# Patient Record
Sex: Male | Born: 1950 | Race: White | Hispanic: No | Smoking: Former smoker
Health system: Southern US, Community
[De-identification: ages and names within clinical notes are randomized; demographics above are authoritative.]

## PROBLEM LIST (undated history)

## (undated) DIAGNOSIS — R42 Dizziness and giddiness: Secondary | ICD-10-CM

## (undated) DIAGNOSIS — J439 Emphysema, unspecified: Secondary | ICD-10-CM

## (undated) DIAGNOSIS — M797 Fibromyalgia: Secondary | ICD-10-CM

## (undated) DIAGNOSIS — Z85828 Personal history of other malignant neoplasm of skin: Secondary | ICD-10-CM

## (undated) DIAGNOSIS — C679 Malignant neoplasm of bladder, unspecified: Secondary | ICD-10-CM

## (undated) DIAGNOSIS — I2510 Atherosclerotic heart disease of native coronary artery without angina pectoris: Secondary | ICD-10-CM

## (undated) DIAGNOSIS — F419 Anxiety disorder, unspecified: Secondary | ICD-10-CM

## (undated) DIAGNOSIS — Z87442 Personal history of urinary calculi: Secondary | ICD-10-CM

## (undated) DIAGNOSIS — K219 Gastro-esophageal reflux disease without esophagitis: Secondary | ICD-10-CM

## (undated) DIAGNOSIS — R300 Dysuria: Secondary | ICD-10-CM

## (undated) DIAGNOSIS — I714 Abdominal aortic aneurysm, without rupture: Secondary | ICD-10-CM

## (undated) DIAGNOSIS — N401 Enlarged prostate with lower urinary tract symptoms: Secondary | ICD-10-CM

## (undated) DIAGNOSIS — I480 Paroxysmal atrial fibrillation: Secondary | ICD-10-CM

## (undated) DIAGNOSIS — Z9889 Other specified postprocedural states: Secondary | ICD-10-CM

## (undated) DIAGNOSIS — I499 Cardiac arrhythmia, unspecified: Secondary | ICD-10-CM

## (undated) DIAGNOSIS — R112 Nausea with vomiting, unspecified: Secondary | ICD-10-CM

## (undated) DIAGNOSIS — M1990 Unspecified osteoarthritis, unspecified site: Secondary | ICD-10-CM

## (undated) DIAGNOSIS — Z9861 Coronary angioplasty status: Secondary | ICD-10-CM

## (undated) DIAGNOSIS — E039 Hypothyroidism, unspecified: Secondary | ICD-10-CM

## (undated) DIAGNOSIS — Z973 Presence of spectacles and contact lenses: Secondary | ICD-10-CM

## (undated) HISTORY — PX: CORONARY ANGIOPLASTY: SHX604

## (undated) HISTORY — PX: CARDIAC CATHETERIZATION: SHX172

---
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
## 1998-05-29 ENCOUNTER — Ambulatory Visit (HOSPITAL_COMMUNITY): Admission: RE | Admit: 1998-05-29 | Discharge: 1998-05-29 | Payer: Self-pay | Admitting: Internal Medicine

## 1998-05-31 DIAGNOSIS — I2510 Atherosclerotic heart disease of native coronary artery without angina pectoris: Secondary | ICD-10-CM

## 1998-05-31 HISTORY — DX: Atherosclerotic heart disease of native coronary artery without angina pectoris: I25.10

## 1999-01-16 ENCOUNTER — Emergency Department (HOSPITAL_COMMUNITY): Admission: EM | Admit: 1999-01-16 | Discharge: 1999-01-17 | Payer: Self-pay | Admitting: Internal Medicine

## 1999-04-06 ENCOUNTER — Emergency Department (HOSPITAL_COMMUNITY): Admission: EM | Admit: 1999-04-06 | Discharge: 1999-04-06 | Payer: Self-pay | Admitting: Emergency Medicine

## 1999-05-16 ENCOUNTER — Inpatient Hospital Stay (HOSPITAL_COMMUNITY): Admission: EM | Admit: 1999-05-16 | Discharge: 1999-05-19 | Payer: Self-pay | Admitting: Emergency Medicine

## 1999-05-16 ENCOUNTER — Encounter: Payer: Self-pay | Admitting: Internal Medicine

## 1999-05-19 ENCOUNTER — Inpatient Hospital Stay (HOSPITAL_COMMUNITY): Admission: EM | Admit: 1999-05-19 | Discharge: 1999-05-20 | Payer: Self-pay | Admitting: Emergency Medicine

## 1999-05-19 ENCOUNTER — Encounter: Payer: Self-pay | Admitting: *Deleted

## 1999-05-26 ENCOUNTER — Encounter: Payer: Self-pay | Admitting: Emergency Medicine

## 1999-05-26 ENCOUNTER — Inpatient Hospital Stay (HOSPITAL_COMMUNITY): Admission: EM | Admit: 1999-05-26 | Discharge: 1999-05-27 | Payer: Self-pay | Admitting: Emergency Medicine

## 2001-06-14 ENCOUNTER — Observation Stay (HOSPITAL_COMMUNITY): Admission: EM | Admit: 2001-06-14 | Discharge: 2001-06-14 | Payer: Self-pay | Admitting: *Deleted

## 2001-06-14 ENCOUNTER — Encounter: Payer: Self-pay | Admitting: Emergency Medicine

## 2002-07-23 ENCOUNTER — Inpatient Hospital Stay (HOSPITAL_COMMUNITY): Admission: EM | Admit: 2002-07-23 | Discharge: 2002-07-25 | Payer: Self-pay | Admitting: Emergency Medicine

## 2002-12-07 ENCOUNTER — Encounter: Admission: RE | Admit: 2002-12-07 | Discharge: 2002-12-07 | Payer: Self-pay | Admitting: Internal Medicine

## 2002-12-07 ENCOUNTER — Encounter: Payer: Self-pay | Admitting: Internal Medicine

## 2002-12-08 ENCOUNTER — Encounter: Payer: Self-pay | Admitting: Internal Medicine

## 2002-12-08 ENCOUNTER — Encounter: Admission: RE | Admit: 2002-12-08 | Discharge: 2002-12-08 | Payer: Self-pay | Admitting: Internal Medicine

## 2005-11-02 ENCOUNTER — Emergency Department (HOSPITAL_COMMUNITY): Admission: EM | Admit: 2005-11-02 | Discharge: 2005-11-02 | Payer: Self-pay | Admitting: Emergency Medicine

## 2006-08-10 ENCOUNTER — Emergency Department (HOSPITAL_COMMUNITY): Admission: EM | Admit: 2006-08-10 | Discharge: 2006-08-11 | Payer: Self-pay | Admitting: Emergency Medicine

## 2006-08-24 ENCOUNTER — Ambulatory Visit (HOSPITAL_COMMUNITY): Admission: RE | Admit: 2006-08-24 | Discharge: 2006-08-24 | Payer: Self-pay | Admitting: Gastroenterology

## 2006-08-24 HISTORY — PX: ESOPHAGOGASTRODUODENOSCOPY: SHX1529

## 2006-09-06 ENCOUNTER — Encounter: Admission: RE | Admit: 2006-09-06 | Discharge: 2006-09-06 | Payer: Self-pay | Admitting: Internal Medicine

## 2007-05-09 ENCOUNTER — Observation Stay (HOSPITAL_COMMUNITY): Admission: EM | Admit: 2007-05-09 | Discharge: 2007-05-10 | Payer: Self-pay | Admitting: Emergency Medicine

## 2008-06-12 HISTORY — PX: CARDIOVASCULAR STRESS TEST: SHX262

## 2010-10-13 NOTE — Discharge Summary (Signed)
[name], [name]              ACCOUNT NO.:  [id number]   MEDICAL RECORD NO.:  [id number]          PATIENT TYPE:  INP   LOCATION:  3733                         FACILITY:  [facility]   PHYSICIAN:  Nicki Guadalajara,, MD      DATE OF BIRTH:  06/11/1950   DATE OF ADMISSION:  05/09/2007  DATE OF DISCHARGE:  05/10/2007                               DISCHARGE SUMMARY   HISTORY:  Mr. Hard is a 60 year old male patient of Dr. Clayborne Dana who came to the emergency room with complaints of chest pain  radiating to his left arm last night.  He took some Aleve and the pain  resolved.  In the morning he had gone to work.  He had a recurrence of  his chest pain.  He then went to the emergency room.  He was given  sublingual nitroglycerin and IV morphine, and he had a vasovagal  reaction.  His blood pressure dropped.  His heart rate dropped.  He  required atropine IV.  When he was seen by Dr. Ritta Slot,  he was  pain-free.  He does have a prior medical history of coronary artery  disease.  He has had a percutaneous transluminal coronary angiography to  his distal LAD in 2000.  He had a re-catheterization in 2003.  He had  patent coronaries.  It was decided that he should be admitted and put on  IV heparin and ruled out for a myocardial infarction.   HOSPITAL COURSE:  He was seen by Dr. Nicki Guadalajara on May 10, 2007.  It was felt that the chest pain was somewhat atypical.  His CK-MBs were  negative x3  It was decided that he could have an outpatient Myoview  test.   LABORATORY DATA:  Hemoglobin 14.8, hematocrit 43.7, WBC 7.1, platelets  206.  Sodium 137, potassium 3.4, BUN 13, creatinine 0.07.  Total  cholesterol 158, HDL 35, triglycerides 86 and LDL 106.  CKs and  troponins were negative x3.   DISCHARGE MEDICATIONS:  1. Prevacid 40 mg daily.  2. Xanax 1 mg three times daily.  3. Aspirin 81 mg daily.  4. Ramipril 2.5 mg daily.  5. Crestor 10 mg daily.  He was given prescriptions  for his new      medications which are Ramipril and Crestor.   DISCHARGE DIAGNOSES:  1. Chest pain with negative CK-MBs and troponins.  The pain is      somewhat atypical.  It was decided that he could be worked up as an      outpatient.  2. Known coronary artery disease with intervention in 2000, with a re-      catheterization in 2003, with clean coronaries:  Last Myoview in      the office was in December 2007, which was a stress Myoview and was      negative for any ischemia.  3. Hyperlipidemia:  He is now placed on Crestor.  His HDL is low and      his LDL is 107, with a goal of less than 70.  4. Vasovagal reaction with nitroglycerin and  morphine:  He should not      receive nitroglycerin sublingual any more.  5. Gastroesophageal reflux disease.      Marvin Gill, N.P.    ______________________________  Nicki Guadalajara,, MD    BB/MEDQ  D:  05/10/2007  T:  05/10/2007  Job:  [id number]

## 2010-10-16 NOTE — Op Note (Signed)
NAMEJOSHUA, SOULIER NO.:  [id number]   MEDICAL RECORD NO.:  [id number]          PATIENT TYPE:  AMB   LOCATION:  ENDO                         FACILITY:  [facility]   PHYSICIAN:  Danise Edge, M.D.   DATE OF BIRTH:  [birth year]   DATE OF PROCEDURE:  08/24/2006  DATE OF DISCHARGE:                               OPERATIVE REPORT   PROCEDURE INDICATION:  Mr. Corrin Hingle is a 60 year old male born  1950/12/05.  Mr. Magoon has a foreign body sensation in his  throat and occasionally gets choked when he swallows.  His symptoms are  localized to the cervical esophagus.  He is also due for a screening  colonoscopy with polypectomy to prevent colon cancer.   ENDOSCOPIST:  Danise Edge, M.D.   PREMEDICATION:  Fentanyl 150 mcg, Versed 15 mg.   PROCEDURE:  Esophagogastroduodenoscopy.  After obtaining informed  consent, Mr. Esty was placed in the left lateral decubitus position.  I administered intravenous fentanyl and intravenous Versed to achieve  conscious sedation for the procedure.  The patient's blood pressure,  oxygen saturation and cardiac rhythm were monitored throughout the  procedure and documented in the medical record.   The Olympus gastroscope was passed through the posterior hypopharynx  into the proximal esophagus without difficulty.  The hypopharynx, larynx  and vocal cords appeared normal.   Esophagoscopy:  The proximal mid and lower segments of the esophageal  mucosa appeared normal.   Gastroscopy:  Retroflexed view of the gastric cardia and fundus was  normal.  The gastric body, antrum and pylorus appeared normal.   Duodenoscopy:  The duodenal bulb and descending duodenum appeared  normal.   ASSESSMENT:  Normal esophagogastroduodenoscopy.   PROCEDURE:  Screening proctocolonoscopy to the cecum.  Anal inspection  and digital rectal exam were normal except for an enlarged but  nonnodular prostate.  The Olympus Pentax colonoscope was  introduced into  the rectum and advanced to the cecum.  Colonic preparation for the exam  today was good   Rectum normal.  Retroflexed view of the distal rectum normal.  Sigmoid colon and descending colon normal.  Splenic flexure normal.  Transverse colon normal.  Hepatic flexure normal.  Ascending colon normal.  Cecum and ileocecal valve normal.   ASSESSMENT:  Normal screening proctocolonoscopy to the cecum.   RECOMMENDATIONS:  If difficulty swallowing persists, I would recommend a  barium tablet - liquid barium swallow x-ray.           ______________________________  Danise Edge, M.D.     MJ/MEDQ  D:  08/24/2006  T:  08/24/2006  Job:  [id number]

## 2010-10-16 NOTE — H&P (Signed)
. [facility]  Patient:    Marvin Gill, DONSON Visit Number: [id number] MRN: [id number]          Service Type: MED Location: 1800 [id number] [id number] Attending Physician:  Carmelina Peal Dictated by:   Marya Fossa, P.A. Admit Date:  06/14/2001   CC:         Runell Gess, M.D.  Barbette Hair. Vaughan Basta., M.D.   History and Physical  ADMISSION DIAGNOSES: 1. Chest pain, rule out myocardial infarction. 2. Coronary artery disease. 3. Remote tobacco abuse.  HISTORY OF PRESENT ILLNESS:  A 60 year old white male patient of Dr. Nanetta Batty with known coronary artery disease.  He had been experiencing chest pain x 1 month (two to three times a week) usually when trying to rest at night.  He did not use nitroglycerin, as it was expired.  His symptoms would progress to a 6/10.  He would have shortness of breath, occasional dizziness, and the discomfort would generally last most of the night; nonexertional.  Last night around 3 or 4 a.m. he was awakened from sleep secondary to chest pain under the left breast.  It was dull and constant; 6/10.  Shortness of breath and nausea.  Could not fall back to sleep.  Took Vioxx without relief. Was advised to come to the emergency room by Dr. Renelda Mom office.  Upon arrival to the emergency room the patient continued to have 6-7/10 chest pain.  IV nitroglycerin and heparin were started.  Pain improved to 3/10.  He states his pain is similar to chest pain he has had in the past.  ALLERGIES:  CODEINE.  MEDICATIONS: 1. Aspirin. 2. Prevacid 30 mg a day. 3. Paxil 40 mg a day. 4. Xanax 1 mg a day. 5. Vioxx. 6. Cortisporin Otic four drops q.i.d. to the right great toe.  PAST MEDICAL HISTORY:  Coronary disease, heart catheterization May 18, 1999 by Dr. Jenne Campus revealed a distal LAD lesion of 80%.  The vessel was small and so CrossSail balloon PTCA was performed but no stenting.  EF 60%. Essentially single-vessel  disease.  Patient denies hyperlipidemia.  Last checked one year ago.  Denies hypertension or diabetes.  He had an ingrown toenail removal of the right great toe on June 13, 2001.  SOCIAL HISTORY:  Patient is married, father of two, grandfather of two.  No tobacco or alcohol.  Works as Clinical biochemist person.  No new or strenuous activities.  No regular exercise and he does not watch his diet.  FAMILY HISTORY:  Mother deceased with a brain tumor.  Father alive and well, has hypertension.  Two sisters are healthy.  REVIEW OF SYSTEMS:  See HPI.  Denies constitutional, GI, or GU complaints.  He has gained about 50 pounds over the last two years and feels this is secondary to Paxil therapy.  PHYSICAL EXAMINATION:  VITAL SIGNS:  Temperature 97, blood pressure 116/72, pulse 66, respirations 20, SAO2 97% on room air.  GENERAL:  Patient is alert and oriented x 3 in no acute distress, sitting on the exam table.  HEENT:  Normocephalic, atraumatic.  PERRL, EOMI.  Nares patent, pharynx benign.  NECK:  Supple without bruits or masses.  LUNGS:  Clear to auscultation.  CARDIAC:  Regular rate and rhythm without murmurs, gallops, or rubs.  ABDOMEN:  Soft, nontender, nondistended, normal active bowel sounds, ______.  EXTREMITIES:  ______ bilaterally.  No bruits.  Distal pulses 2+ bilaterally without edema.  Bandage on the right great toe.  NEUROLOGIC:  Nonfocal.  LABORATORY DATA:  WBC 6.7, hemoglobin 15.2, platelets 221.  Potassium 4.0, BUN 20, creatinine 0.9, glucose 95.  First enzymes negative.  EKG shows sinus bradycardia without acute abnormality.  Chest x-ray pending.  IMPRESSION: 1. Chest pain, rule out myocardial infarction. 2. Single-vessel coronary artery disease. 3. Unknown lipid status.  PLAN: 1. Admit. 2. IV heparin, IV nitroglycerin. 3. Cycle enzymes. 4. Heart catheterization. Dictated by:   Marya Fossa, P.A. Attending Physician:  Carmelina Peal DD:   06/14/01 TD:  06/14/01 Job: [id number]

## 2010-10-16 NOTE — Cardiovascular Report (Signed)
[place]  Patient:    Marvin Gill                      MRN: [id number] Proc. Date: 05/18/99 Adm. Date:  [id number] Attending:  Kae Heller                        Cardiac Catheterization  ATTENDING FOR THE CASE:  Lenise Herald, M.D.  PROCEDURES: 1. Left heart catheterization. 2. Coronary angiography. 3. Left ventriculogram. 4. ______  of left anterior descending artery-distal    (a) Percutaneous transluminal coronary balloon angioplasty  COMPLICATIONS:  None.  INDICATIONS:  [name] is 60 year old white male patient of Dr. Ike Bene with a history of tobacco abuse who was admitted on May 16, 1999, with substernal chest pain [place] the airport.  He subsequently ruled out for MI and was noted to have anterolateral T wave inversions and was subsequently referred for cardiac catheterization.  DESCRIPTION OF PROCEDURE:  After giving informed written consent, the patient was brought to the cardiac catheterization lab where his right and left groins were  shaved, prepped, and draped in the usual sterile fashion.  ECG monitoring was established.  Using a modified Seldinger technique, a 6-French arterial sheath as inserted into the right femoral artery, and 6-French diagnostic catheters were hen used to perform diagnostic angiography.  ANGIOGRAPHIC RESULTS: 1. This revealed a medium sized left main with no significant disease. 2. The LAD was a medium sized vessel which coursed to the apex and gave rise to two    diagonal branches.  The LAD was noted to have a long kinking segment in the id    portion of the vessel with no high grade stenosis.  There was an 80% stenotic    lesion distal to the second diagonal which was noted to be a relatively small    ______ vessel.  The first diagonal was a small vessel with a 60% ostial    stenosis.  The second diagonal was a small vessel which bifurcated in  its distal    portion and had no significant disease.  The left coronary artery also gave ise    to a medium sized ramus intermedius which bifurcated in its mid portion and ad    no significant disease. 3. The left circumflex is a medium sized vessel which coursed in the AV groove nd    gave rise to one obtuse marginal branch.  Both the circumflex as well as the  first OM had no significant disease. 4. The RCA was a large vessel which was dominant and gave rise to both a PDA as  well as a posterolateral branch.  The RCA, PDA, and posterolateral branch had no    significant disease.  A left ventriculogram revealed normal wall motion with an estimated ejection fraction of 60%.  There is no significant MR.  Hemodynamics:  Systemic arterial pressure 107/66; LV systemic pressure 108/10; LVEDP of 16.  INTERVENTIONAL PROCEDURE:  LAD-distal:  Following diagnostic angiography, a 7-French JL4 guiding catheter as tracked and engaged in the left coronary ostium, and a selective coronary angiogram was then performed.  Next, a 0.014 Patriot exchange length guide wire was advanced out of the guiding catheter into the proximal LAD.  The guide wire was then used to cross into the distal LAD without difficulty.  Next, a CrossSail 2.0 x 20-mm balloon was then tracked over the guide wire and  positioned into the distal LAD  stenotic lesion.  Four subsequent inflations were then performed to a maximum of 10 atmospheres for a total of five minutes.  A followup angiogram revealed no evidence of dissection or thrombus with TIMI-3 flow to the distal vessel.  Because of the complexity of the case though, we elected to proceed with ReoPro administration. Intravenous doses of heparin were given to maintain the ACT between 200-300.  Final orthogonal angiograms revealed less than 20% residual stenosis in the distal LAD with TIMI-3 flow to the distal vessel with no evidence of dissection  or thrombus.  At this point, we elected to conclude the procedure.  All the balloons, wires, and catheters were removed.  Hemostatic sheaths were sewn in place.  The  patient was transferred back to the ward in stable condition.  CONCLUSIONS: 1. Successful percutaneous transluminal coronary balloon angioplasty of the distal    LAD using a CrossSail 2.0 x 20-mm balloon. 2. Adjunct use of ReoPro (abciximab) infusion. 3. Normal LV systolic function. DD:  05/18/99 TD:  05/19/99 Job: [id number]

## 2010-10-16 NOTE — Discharge Summary (Signed)
[place]  Patient:    Marvin Gill                      MRN: [id number] Adm. Date:  [id number] Disc. Date: [id number] Attending:  Darlin Priestly Dictator:   Darcella Gasman. Annie Paras, F.N.P.C. CC:         Lenise Herald, M.D.             Jaclyn Prime. Lucas Mallow, M.D.             Barbette Hair. Vaughan Basta., M.D.                           Discharge Summary  DISCHARGE DIAGNOSIS: 1.  Chest pain, atypical.     a.  Negative myocardial infarction. 2.  History of coronary artery disease with percutaneous transluminal coronary     angioplasty of the left anterior descending.  DISCHARGE CONDITION:  Improved.  PROCEDURES:  None.  DISCHARGE MEDICATIONS: 1.  Vioxx 12.5 mg 1 q.d. with food. 2.  Xanax 1 mg b.i.d. 3.  Prevacid 30 mg q.d. 4.  Hold Altace. 5.  Lipitor 10 mg q.d. 6.  Atenolol 1/2 tablet q.d. of 25 mg tablet, which would equal 12.5 mg. 7.  Enteric-coated aspirin 325 mg q.d. 8.  Zoloft 100 mg q.d. 9.  Nitroglycerin 1/150 sublingual p.r.n. chest pain.  ACTIVITY:  As tolerated.  DIET:  Low-fat/low-salt diet.  DISCHARGE INSTRUCTIONS:  If dizziness continues call the office, to stop the Atenolol.  DISPOSITION:  To follow up with Dr. Jenne Campus June 03, 1999 as previously instructed.  HISTORY OF PRESENT ILLNESS:  The patient is a 60 year old male who presented to the emergency room on May 26, 1999 with a history of chest pain.  He stated he had recurrent frequent anterior chest pain, gnawing in character an usually lasting two to three minutes at a time, and often separated by only ten minute or so ever since his second heart catheterization.  The night of admission the pain was much more severe and was associated with profound diaphoresis and with near-syncope.  He ad partial relief with two nitroglycerin and came to the emergency room, where his  pain eventually resolved.  The pain at the time of initial exam had recurred and once again  improved on IV nitroglycerin.  PAST MEDICAL HISTORY:  No major surgeries.  On May 18, 1999 he had angioplasty done on his LAD by Dr. Jenne Campus after [place] the [place] and was brought to the emergency room by EMS.  He was discharged on May 19, 1999 and returned with chest pain that same night.  Repeat cardiac catheterization on May 20, 1999 demonstrated 60% lesion in the first diagonal of the LAD, 20% in the distal LAD.  No intervention procedure was required.  Since the patient left the hospital after the second visit and second catheterization he continued to ave the above symptoms as stated.  FAMILY HISTORY, SOCIAL HISTORY, REVIEW OF SYSTEMS:  Please see the History and Physical for details.  OUTPATIENT MEDICATIONS: 1.  Prevacid 30 mg q.d. 2.  Zoloft 100 mg q.d. 3.  Aspirin 1 tablet q.d. 4.  Lipitor 10 mg q.d. 5.  Atenolol 12.5 mg b.i.d. 6.  Xanax 1 mg b.i.d. p.r.n. 7.  Altace 1.25 mg q.d.  DISCHARGE PHYSICAL EXAMINATION:  VITAL SIGNS:  Blood pressure 112/60, respirations 20, pulse 72, afebrile with temperature 97.3 degrees.  It was noted blood pressure supine was 100/60, sitting 92/60, and standing 90/60.  GENERAL:  Alert and oriented white male in no acute distress.  SKIN:  Warm and dry.  Brisk capillary refill.  LUNGS:  Clear without rales, rhonchi, or wheezes.  Normal respirations.  HEART:  Heart sounds regular rate and rhythm without gallop, click, or murmur.  ABDOMEN:  Soft, nontender, positive bowel sounds.  LABORATORY DATA:  Hemoglobin 14, hematocrit 38.9, WBC 11.1, MCV 85; platelets 206,000; neutrophils 90%, lymphs 6%, monos 3%, eosinophils 0%, basophils 0%, leukocytes 1%.  Pro time 13.5, INR 1.1, PTT 28.  Sodium 141, potassium 4, chloride 105, CO2 31, glucose 99, BUN 15, creatinine 0.9.  Calcium 8.7, total protein 6,  albumin 3.4, SGOT 20, SGPT 19, alkaline phosphatase 60, total bilirubin 1.0, magnesium 2.  Cardiac  markers showed CK 27 to 27, MB 0.3 to 0.4, Troponin less than 0.03 to less than 0.03.  Radiology:  Portable chest x-ray showed lungs hyperaerated but clear of an active process, normal cardiac/mediastinal silhouette, no congestive failure.  EKG on admission showed sinus bradycardia, heart rate 56, otherwise normal EKG. Follow-up on May 27, 1999 showed sinus bradycardia with rate 55, otherwise normal EKG, no significant change from previous tracing.  HOSPITAL COURSE:  The patient was admitted by Dr. Lucas Mallow for Dr. Jenne Campus on May 26, 1999 after experiencing significant chest pain on arrival in the emergency room.  He also had a near-syncopal episode.  He was started on IV nitroglycerin and Lovenox per pharmacy and admitted to 6500.  By the next morning cardiac enzymes  were negative and he had no further pain.  He did have some orthostatic hypotension and we changed his medication prior to discharge as he sounded hypotensive prior to admission with vagal type symptoms.  He would follow up as an outpatient with Dr. Jenne Campus.   DD:  07/09/99 TD:  07/10/99 Job: [id number]

## 2010-10-16 NOTE — Discharge Summary (Signed)
[place]  Patient:    Marvin Gill, Marvin Gill Visit Number: [id number] MRN: [id number]          Service Type: MED Location: 1800 1833 01 Attending Physician:  Darlin Priestly Dictated by:   Marya Fossa, P.A. Admit Date:  06/14/2001 Discharge Date: 06/14/2001   CC:         Marvin Hair. Vaughan Basta., M.D.  Runell Gess, M.D.   Discharge Summary  DATE OF BIRTH:  February 23, 1951.  ADMISSION DIAGNOSES: 1. Chest pain, rule out myocardial infarction. 2. Single vessel coronary disease. 3. Reflux. 4. Remote tobacco abuse.  DISCHARGE DIAGNOSES: 1. Chest pain - noncardiac - suspect gastrointestinal etiology.  Cardiac    catheterization revealing essentially normal coronary arteries and patent    prior percutaneous transluminal coronary angioplasty site of distal    left anterior descending. 2. Single vessel coronary disease. 3. Reflux. 4. Remote tobacco abuse.  HISTORY OF PRESENT ILLNESS:  This 60 year old white male patient of Runell Gess, M.D., with single vessel CAD.  For the last month, he has been experiencing chest pain which usually occurs in the evening when he lays down and stays with him through the night.  A 6/10 at worst.  Occasional shortness of breath.  He has not used nitroglycerin. Last night he had spaghetti for dinner.  Later on, he was awakened from sleep around 3 or 4 in the morning with chest discomfort on the left breast.  Dull and constant, 6/10.  Positive shortness of breath and nausea.  He could not fall back to sleep.  In the morning, he called Dr. Renelda Mom office and was advised to come to the emergency room.  Upon arrival to the ER, he continued with 6 to 7/10 chest pain.  IV nitroglycerin was started.  Currently, 3/10 chest pain with IV nitroglycerin and IV heparin.  EKG is nonacute.  First enzymes negative.  Because the patient has had persistent recurrent chest pain now waking him from sleep, we  will proceed with cardiac catheterization for definitive diagnosis.  PROCEDURES:  Cardiac catheterization by Delrae Rend, M.D., June 14, 2001.  COMPLICATIONS:  None.  CONSULTATIONS:  None.  HOSPITAL COURSE:  The patient was admitted through the emergency department of [place].  Admission labs were within normal limits. First cardiac enzymes were negative.  EKG nonacute.  The patient was taken to the cardiac catheterization lab on IV heparin and IV nitroglycerin by Dr. Jacinto Halim on June 14, 2001.  This revealed essentially normal coronary arteries.  Prior PTCA site of the distal LAD was widely patent.  The patient tolerated the procedure well.  Perclose was used.  Dr. Jacinto Halim feels that the patients symptoms are probably reflux in etiology. We have advised him to refrain from spicy and acidic food.  We are going to increase his Prevacid to 30 mg b.i.d.  We have talked to Molly Maduro D. Vaughan Basta., M.D.s, office and recommend he follow-up to evaluate possible GI component of chest pain.  It is clearly noncardiac.  DISCHARGE MEDICATIONS: 1. Aspirin a day. 2. Prevacid 30 mg b.i.d. 3. Paxil 40 mg a day. 4. Xanax 1 mg a day. 5. Vioxx p.r.n. 6. Cortisporin otic four drops q.i.d. to the right great toe as directed.  FOLLOW-UP:  He has a follow-up appointment scheduled with Dr. Allyson Sabal on June 28, 2001, at 12:15.  He is to contact Dr. Wray Kearns office to schedule a follow-up appointment as well. Dictated by:  Marya Fossa, P.A. Attending Physician:  Darlin Priestly DD:  06/14/01 TD:  06/15/01 Job: [id number]

## 2010-10-16 NOTE — Cardiovascular Report (Signed)
[place]. [facility]  Patient:    Marvin Gill, Marvin Gill Visit Number: [id number] MRN: [id number]          Service Type: MED Location: [id number] Attending Physician:  Marvin Gill Dictated by:   Marvin Gill, M.D. Proc. Date: 06/14/01 Admit Date:  06/14/2001   CC:         Marvin Hair. Vaughan Basta., M.D.  [facility], [address] [address]   Cardiac Catheterization  REFERRING PHYSICIAN:  Barbette Hair. Vaughan Basta., M.D.  PROCEDURE PERFORMED: 1. Selective left ventriculography. 2. Selective right and left coronary arteriography. 3. Right femoral angiography. 4. Closure of right femoral arterial access site with Perclose.  INDICATIONS:  [name] is a 60 year old gentleman with a history of previous coronary artery disease with angioplasty to the distal left anterior descending artery on May 18, 1999, who presents to the emergency room with complaints of chest pressure.  This woke him up from sleep.  Of note, he has been having recurrent episodes of chest pressure and shortness of breath in the last four to six months.  Due to his symptoms and also ongoing chest pressure in the emergency room, he was brought to the cardiac catheterization to evaluate his coronary anatomy.  ANGIOGRAPHIC DATA:  Left ventricular pressure 116/8 with an end diastolic pressure of 10 mmHg. The central aortic pressure was 116/76 with a mean of 96 mmHg.  There was no pressure gradient across the aortic valve.  1. Right coronary artery:  The right coronary artery was a large caliber    vessel.  It was a dominant vessel.  It gives origin to a small PDA and a    very large PLV branch.  It is disease-free. 2. Left main coronary:  The left main coronary is a large caliber vessel and    trifurcates into left anterior descending artery, intermedius, and    circumflex coronary artery. 3. Circumflex coronary artery:  The circumflex artery is a  moderate caliber    vessel and is disease-free.  It gives an SA nodal branch. 4. Intermedius coronary artery:  The intermedius coronary artery is a    moderate caliber vessel.  It bifurcates in the mid segment.  It is    disease-free. 5. Left anterior descending artery:  The left anterior descending artery is a    large caliber vessel and it wraps around the apex.  It gives origin to    several small diagonals.  Of note, diagonal #1, diagonal #2 which are very    small and several small septal perforators.  This is disease-free.  The    previous PTCA site in the distal LAD is widely patent and there is no hint    of any restenosis or plaque formation.  Right femoral angiography revealed good arterial access site.  IMPRESSION: 1. Normal left ventricular systolic function. 2. Normal coronary arteries with no evidence of any plaque noted in the distal    left anterior descending artery percutaneous transluminal coronary    angioplasty site.  RECOMMENDATIONS:  At this time, the work-up for noncardiac cause of chest pain is indicated.  The patient will follow up with Dr. Merril Gill.  TECHNIQUE:  Under usual sterile precautions using a #6 French right femoral arterial access, a #6 [place] Multipurpose B2 catheter was advanced in the ascending aorta over a 0.035-mm J wire.  The catheter was gently advanced to the left ventricle.  Left ventricular pressures were monitored.  Hand contrast injection of the left ventricle was performed in the RAO projection.  The catheter was flushed with saline and pulled back to the ascending aorta and pressure gradient across the aortic valve was monitored.  The right coronary artery was selectively engaged and arteriography was performed.  In a similar fashion, the left main coronary artery was selectively engaged and arteriography was performed.  After obtaining adequate angiographic data, the catheter was pulled out of the body.  Right femoral angiography  was performed through the arterial access sheath.  The arterial access was then closed with Perclose with excellent hemostasis.  The patient was transferred to the floor in stable condition.  Dictated by:   Marvin Gill, M.D. Attending Physician:  Marvin Gill DD:  06/14/01 TD:  06/14/01 Job: [id number]

## 2011-03-08 LAB — CARDIAC PANEL(CRET KIN+CKTOT+MB+TROPI)
CK, MB: 0.8
CK, MB: 1.1
Relative Index: INVALID
Total CK: 39
Total CK: 43

## 2011-03-08 LAB — COMPREHENSIVE METABOLIC PANEL
Albumin: 3.4 — ABNORMAL LOW
BUN: 14
Chloride: 105
Creatinine, Ser: 1.04
GFR calc non Af Amer: >60
Total Bilirubin: 0.9

## 2011-03-08 LAB — PROTIME-INR
INR: 0.9
Prothrombin Time: 12.2

## 2011-03-08 LAB — DIFFERENTIAL
Basophils Absolute: 0
Basophils Relative: 0
Eosinophils Absolute: 0.1 — ABNORMAL LOW
Eosinophils Relative: 2
Monocytes Absolute: 0.7

## 2011-03-08 LAB — CBC
HCT: 43.7
HCT: 46.4
Hemoglobin: 14.8
Hemoglobin: 15.6
MCHC: 33.8
MCV: 89.6
Platelets: 206
RDW: 13.4
WBC: 7.1

## 2011-03-08 LAB — HOMOCYSTEINE: Homocysteine: 8.8

## 2011-03-08 LAB — BASIC METABOLIC PANEL
BUN: 13
Chloride: 104
Glucose, Bld: 104 — ABNORMAL HIGH
Potassium: 3.4 — ABNORMAL LOW

## 2011-03-08 LAB — HEPARIN LEVEL (UNFRACTIONATED): Heparin Unfractionated: 1.38 — ABNORMAL HIGH

## 2011-03-08 LAB — I-STAT 8, (EC8 V) (CONVERTED LAB)
BUN: 15
Bicarbonate: 28.3 — ABNORMAL HIGH
Glucose, Bld: 105 — ABNORMAL HIGH
Sodium: 140
pCO2, Ven: 58.6 — ABNORMAL HIGH

## 2011-03-08 LAB — TSH: TSH: 4.286

## 2011-03-08 LAB — MAGNESIUM: Magnesium: 1.7

## 2011-03-08 LAB — POCT CARDIAC MARKERS
CKMB, poc: <1 — ABNORMAL LOW
Troponin i, poc: <0.05

## 2011-03-08 LAB — CK TOTAL AND CKMB (NOT AT ARMC): CK, MB: 1.3

## 2011-03-08 LAB — LIPID PANEL
Cholesterol: 158
LDL Cholesterol: 106 — ABNORMAL HIGH

## 2013-11-21 ENCOUNTER — Other Ambulatory Visit: Payer: Self-pay | Admitting: Gastroenterology

## 2013-12-12 ENCOUNTER — Encounter (HOSPITAL_COMMUNITY): Payer: Self-pay | Admitting: Pharmacy Technician

## 2013-12-12 ENCOUNTER — Encounter (HOSPITAL_COMMUNITY): Payer: Self-pay | Admitting: *Deleted

## 2013-12-13 ENCOUNTER — Encounter (HOSPITAL_COMMUNITY): Payer: Self-pay | Admitting: Certified Registered Nurse Anesthetist

## 2013-12-13 ENCOUNTER — Encounter (HOSPITAL_COMMUNITY): Payer: 59 | Admitting: Certified Registered Nurse Anesthetist

## 2013-12-13 ENCOUNTER — Encounter (HOSPITAL_COMMUNITY)
Admission: RE | Disposition: A | Discharge status: Home / Self Care | Payer: Self-pay | Source: Ambulatory Visit | Attending: Gastroenterology

## 2013-12-13 ENCOUNTER — Ambulatory Visit (HOSPITAL_COMMUNITY): Payer: 59 | Admitting: Certified Registered Nurse Anesthetist

## 2013-12-13 ENCOUNTER — Ambulatory Visit (HOSPITAL_COMMUNITY)
Admission: RE | Admit: 2013-12-13 | Discharge: 2013-12-13 | Disposition: A | Discharge status: Home / Self Care | Payer: 59 | Source: Ambulatory Visit | Attending: Gastroenterology | Admitting: Gastroenterology

## 2013-12-13 DIAGNOSIS — M1990 Unspecified osteoarthritis, unspecified site: Secondary | ICD-10-CM | POA: Insufficient documentation

## 2013-12-13 DIAGNOSIS — Z87891 Personal history of nicotine dependence: Secondary | ICD-10-CM | POA: Diagnosis not present

## 2013-12-13 DIAGNOSIS — E039 Hypothyroidism, unspecified: Secondary | ICD-10-CM | POA: Insufficient documentation

## 2013-12-13 DIAGNOSIS — Z79899 Other long term (current) drug therapy: Secondary | ICD-10-CM | POA: Insufficient documentation

## 2013-12-13 DIAGNOSIS — R197 Diarrhea, unspecified: Secondary | ICD-10-CM | POA: Diagnosis not present

## 2013-12-13 DIAGNOSIS — K219 Gastro-esophageal reflux disease without esophagitis: Secondary | ICD-10-CM | POA: Insufficient documentation

## 2013-12-13 DIAGNOSIS — N400 Enlarged prostate without lower urinary tract symptoms: Secondary | ICD-10-CM | POA: Insufficient documentation

## 2013-12-13 DIAGNOSIS — I2510 Atherosclerotic heart disease of native coronary artery without angina pectoris: Secondary | ICD-10-CM | POA: Insufficient documentation

## 2013-12-13 HISTORY — DX: Atherosclerotic heart disease of native coronary artery without angina pectoris: I25.10

## 2013-12-13 HISTORY — PX: COLONOSCOPY WITH PROPOFOL: SHX5780

## 2013-12-13 HISTORY — DX: Unspecified osteoarthritis, unspecified site: M19.90

## 2013-12-13 HISTORY — DX: Gastro-esophageal reflux disease without esophagitis: K21.9

## 2013-12-13 HISTORY — DX: Hypothyroidism, unspecified: E03.9

## 2013-12-13 HISTORY — DX: Fibromyalgia: M79.7

## 2013-12-13 SURGERY — COLONOSCOPY WITH PROPOFOL
Anesthesia: Monitor Anesthesia Care

## 2013-12-13 MED ORDER — LACTATED RINGERS IV SOLN
INTRAVENOUS | Status: DC
  Administered 2013-12-13: 1000 mL via INTRAVENOUS

## 2013-12-13 MED ORDER — PROPOFOL 10 MG/ML IV BOLUS
INTRAVENOUS | Status: DC | PRN
  Administered 2013-12-13: 20 mg via INTRAVENOUS
  Administered 2013-12-13: 40 mg via INTRAVENOUS

## 2013-12-13 MED ORDER — SODIUM CHLORIDE 0.9 % IV SOLN: INTRAVENOUS | Status: DC

## 2013-12-13 MED ORDER — ONDANSETRON HCL 4 MG/2ML IJ SOLN
INTRAMUSCULAR | Status: DC | PRN
  Administered 2013-12-13: 4 mg via INTRAVENOUS

## 2013-12-13 MED ORDER — PROPOFOL 10 MG/ML IV BOLUS
INTRAVENOUS | Status: AC
  Filled 2013-12-13: qty 20

## 2013-12-13 MED ORDER — PROMETHAZINE HCL 25 MG/ML IJ SOLN: 6.2500 mg | INTRAMUSCULAR | Status: DC | PRN

## 2013-12-13 MED ORDER — PROPOFOL INFUSION 10 MG/ML OPTIME
INTRAVENOUS | Status: DC | PRN
  Administered 2013-12-13: 75 ug/kg/min via INTRAVENOUS

## 2013-12-13 MED ORDER — LACTATED RINGERS IV SOLN
INTRAVENOUS | Status: DC | PRN
  Administered 2013-12-13: 14:00:00 via INTRAVENOUS

## 2013-12-13 MED ORDER — KETAMINE HCL 10 MG/ML IJ SOLN
INTRAMUSCULAR | Status: DC | PRN
  Administered 2013-12-13: 20 mg via INTRAVENOUS

## 2013-12-13 SURGICAL SUPPLY — 21 items

## 2013-12-13 NOTE — Transfer of Care (Signed)
Immediate Anesthesia Transfer of Care Note  Patient: Marvin Gill  Procedure(s) Performed: Procedure(s): COLONOSCOPY WITH PROPOFOL (N/A)  Patient Location: PACU and Endoscopy Unit  Anesthesia Type:MAC  Level of Consciousness: patient cooperative  Airway & Oxygen Therapy: Patient Spontanous Breathing and Patient connected to face mask oxygen  Post-op Assessment: Report given to PACU RN and Post -op Vital signs reviewed and stable  Post vital signs: Reviewed and stable  Complications: No apparent anesthesia complications

## 2013-12-13 NOTE — Discharge Instructions (Signed)

## 2013-12-13 NOTE — H&P (Signed)
  Problem: Diarrhea  History: The patient is a 63 year old male born [birth year] who underwent a normal screening colonoscopy and diagnostic esophagogastroduodenoscopy on 09/06/2006. He chronically takes Nexium and tamsulosin which can cause diarrhea.  The patient was having up to 7 watery bowel movements daily unassociated with abdominal pain or gastrointestinal bleeding. His diarrhea seems to have resolved. Stool screen for C. difficile toxin and ova/parasite were negative.  The patient is scheduled to undergo diagnostic colonoscopy to perform random colon biopsies to look for microscopic colitis  Allergies: Codeine and Vicodin  Past medical history: Chronic back pain. Insomnia. Anxiety. Depression. Gastroesophageal reflux. Osteoarthritis. Coronary artery disease. Benign prostatic hypertrophy. Fibromyalgia syndrome.  Exam: The patient is alert and lying comfortably on the endoscopy stretcher. Lungs are clear to auscultation. Cardiac exam reveals a regular rhythm. Abdomen is soft and nontender to palpation.  Plan: Proceed with diagnostic colonoscopy

## 2013-12-13 NOTE — Anesthesia Preprocedure Evaluation (Signed)
Anesthesia Evaluation  Patient identified by MRN, date of birth, ID band Patient awake    Reviewed: Allergy & Precautions, H&P , NPO status , Patient's Chart, lab work & pertinent test results  Airway Mallampati: II TM Distance: >3 FB Neck ROM: Full    Dental no notable dental hx.    Pulmonary neg pulmonary ROS, former smoker,  breath sounds clear to auscultation  Pulmonary exam normal       Cardiovascular negative cardio ROS  Rhythm:Regular Rate:Normal     Neuro/Psych negative neurological ROS  negative psych ROS   GI/Hepatic Neg liver ROS, GERD-  Medicated,  Endo/Other  Hypothyroidism   Renal/GU negative Renal ROS  negative genitourinary   Musculoskeletal negative musculoskeletal ROS (+)   Abdominal   Peds negative pediatric ROS (+)  Hematology negative hematology ROS (+)   Anesthesia Other Findings   Reproductive/Obstetrics negative OB ROS (+) Breast feeding                            Anesthesia Physical Anesthesia Plan  ASA: II  Anesthesia Plan: MAC   Post-op Pain Management:    Induction: Intravenous  Airway Management Planned: Nasal Cannula  Additional Equipment:   Intra-op Plan:   Post-operative Plan:   Informed Consent: I have reviewed the patients History and Physical, chart, labs and discussed the procedure including the risks, benefits and alternatives for the proposed anesthesia with the patient or authorized representative who has indicated his/her understanding and acceptance.   Dental advisory given  Plan Discussed with: CRNA and Surgeon  Anesthesia Plan Comments:         Anesthesia Quick Evaluation

## 2013-12-13 NOTE — Op Note (Signed)
Problem: Chronic diarrhea  Endoscopist: Earle Gell  Premedication: Propofol administered by anesthesia  Procedure: Diagnostic colonoscopy with random colon biopsies The patient was placed in the left lateral decubitus position. Anal inspection and digital rectal exam were normal. The Pentax pediatric colonoscope was introduced into the rectum and advanced to the cecum. A normal-appearing appendiceal orifice was identified, intubated, and the terminal ileum inspected. Colonic preparation for the exam today was good.  Rectum. Normal. Retroflex view of the distal rectum normal  Sigmoid colon and descending colon. Normal  Splenic flexure. Normal  Transverse colon. Normal  Hepatic flexure. Normal  Ascending colon. Normal  Cecum and ileocecal valve. Normal  Terminal ileum. Normal  Biopsies: Random biopsies were performed from the right colon and left colon to look for microscopic colitis  Assessment: Normal colonoscopy with inspection of the terminal ileum. Random colon biopsies to look for microscopic colitis pending.  The patient tells me his diarrhea has resolved.

## 2013-12-14 ENCOUNTER — Encounter (HOSPITAL_COMMUNITY): Payer: Self-pay | Admitting: Gastroenterology

## 2013-12-14 NOTE — Anesthesia Postprocedure Evaluation (Signed)
  Anesthesia Post-op Note  Patient: Marvin Gill  Procedure(s) Performed: Procedure(s) (LRB): COLONOSCOPY WITH PROPOFOL (N/A)  Patient Location: PACU  Anesthesia Type: MAC  Level of Consciousness: awake and alert   Airway and Oxygen Therapy: Patient Spontanous Breathing  Post-op Pain: mild  Post-op Assessment: Post-op Vital signs reviewed, Patient's Cardiovascular Status Stable, Respiratory Function Stable, Patent Airway and No signs of Nausea or vomiting  Last Vitals:  Filed Vitals:   12/13/13 1505  BP: 135/85  Pulse: 58  Temp:   Resp: 18    Post-op Vital Signs: stable   Complications: No apparent anesthesia complications

## 2014-03-15 ENCOUNTER — Other Ambulatory Visit: Payer: Self-pay | Admitting: Urology

## 2014-03-27 ENCOUNTER — Encounter (HOSPITAL_BASED_OUTPATIENT_CLINIC_OR_DEPARTMENT_OTHER): Payer: Self-pay | Admitting: *Deleted

## 2014-03-28 ENCOUNTER — Encounter (HOSPITAL_BASED_OUTPATIENT_CLINIC_OR_DEPARTMENT_OTHER): Payer: Self-pay | Admitting: *Deleted

## 2014-03-28 NOTE — Progress Notes (Signed)
NPO AFTER MN. ARRIVE AT 0715. NEEDS HG AND EKG. WILL TAKE AM MEDS W/ SIPS OF WATER. REVIEWED [facility] GUIDELINES, WILL BRING MEDS.

## 2014-03-31 NOTE — Anesthesia Preprocedure Evaluation (Addendum)
Anesthesia Evaluation  Patient identified by MRN, date of birth, ID band Patient awake    Reviewed: Allergy & Precautions, H&P , NPO status , Patient's Chart, lab work & pertinent test results  History of Anesthesia Complications Negative for: history of anesthetic complications  Airway Mallampati: III  TM Distance: >3 FB Neck ROM: Full  Mouth opening: Limited Mouth Opening  Dental no notable dental hx. (+) Dental Advisory Given, Teeth Intact   Pulmonary neg pulmonary ROS,  breath sounds clear to auscultation  Pulmonary exam normal       Cardiovascular Exercise Tolerance: Good + CAD Rhythm:Regular Rate:Normal  S/p angioplasty in 2000, most recent cath in 2003 and normal, reports METS >4, no chest pain, DOE, PND, and very active   Neuro/Psych negative neurological ROS  negative psych ROS   GI/Hepatic Neg liver ROS, GERD-  Medicated and Controlled,  Endo/Other  Hypothyroidism   Renal/GU negative Renal ROS  negative genitourinary   Musculoskeletal  (+) Arthritis -, Osteoarthritis,  Fibromyalgia -  Abdominal   Peds negative pediatric ROS (+)  Hematology negative hematology ROS (+)   Anesthesia Other Findings   Reproductive/Obstetrics negative OB ROS                            Anesthesia Physical Anesthesia Plan  ASA: II  Anesthesia Plan: General   Post-op Pain Management:    Induction: Intravenous  Airway Management Planned: LMA  Additional Equipment:   Intra-op Plan:   Post-operative Plan: Extubation in OR  Informed Consent: I have reviewed the patients History and Physical, chart, labs and discussed the procedure including the risks, benefits and alternatives for the proposed anesthesia with the patient or authorized representative who has indicated his/her understanding and acceptance.   Dental advisory given  Plan Discussed with: CRNA  Anesthesia Plan Comments:          Anesthesia Quick Evaluation

## 2014-04-01 ENCOUNTER — Ambulatory Visit (HOSPITAL_BASED_OUTPATIENT_CLINIC_OR_DEPARTMENT_OTHER): Payer: 59 | Admitting: Anesthesiology

## 2014-04-01 ENCOUNTER — Encounter (HOSPITAL_BASED_OUTPATIENT_CLINIC_OR_DEPARTMENT_OTHER): Payer: Self-pay | Admitting: *Deleted

## 2014-04-01 ENCOUNTER — Ambulatory Visit (HOSPITAL_BASED_OUTPATIENT_CLINIC_OR_DEPARTMENT_OTHER)
Admission: RE | Admit: 2014-04-01 | Discharge: 2014-04-02 | Disposition: A | Discharge status: Home / Self Care | Payer: 59 | Source: Ambulatory Visit | Attending: Urology | Admitting: Urology

## 2014-04-01 ENCOUNTER — Other Ambulatory Visit: Payer: Self-pay

## 2014-04-01 ENCOUNTER — Encounter (HOSPITAL_BASED_OUTPATIENT_CLINIC_OR_DEPARTMENT_OTHER)
Admission: RE | Disposition: A | Discharge status: Home / Self Care | Payer: Self-pay | Source: Ambulatory Visit | Attending: Urology

## 2014-04-01 DIAGNOSIS — Z7982 Long term (current) use of aspirin: Secondary | ICD-10-CM | POA: Insufficient documentation

## 2014-04-01 DIAGNOSIS — Z885 Allergy status to narcotic agent status: Secondary | ICD-10-CM | POA: Insufficient documentation

## 2014-04-01 DIAGNOSIS — Z79899 Other long term (current) drug therapy: Secondary | ICD-10-CM | POA: Insufficient documentation

## 2014-04-01 DIAGNOSIS — M797 Fibromyalgia: Secondary | ICD-10-CM | POA: Diagnosis not present

## 2014-04-01 DIAGNOSIS — K219 Gastro-esophageal reflux disease without esophagitis: Secondary | ICD-10-CM | POA: Insufficient documentation

## 2014-04-01 DIAGNOSIS — F419 Anxiety disorder, unspecified: Secondary | ICD-10-CM | POA: Diagnosis not present

## 2014-04-01 DIAGNOSIS — N400 Enlarged prostate without lower urinary tract symptoms: Secondary | ICD-10-CM

## 2014-04-01 DIAGNOSIS — Z87891 Personal history of nicotine dependence: Secondary | ICD-10-CM | POA: Diagnosis not present

## 2014-04-01 DIAGNOSIS — N401 Enlarged prostate with lower urinary tract symptoms: Secondary | ICD-10-CM | POA: Diagnosis present

## 2014-04-01 DIAGNOSIS — E039 Hypothyroidism, unspecified: Secondary | ICD-10-CM | POA: Diagnosis not present

## 2014-04-01 HISTORY — DX: Coronary angioplasty status: Z98.61

## 2014-04-01 HISTORY — PX: TRANSURETHRAL INCISION OF PROSTATE: SHX2573

## 2014-04-01 HISTORY — DX: Presence of spectacles and contact lenses: Z97.3

## 2014-04-01 LAB — POCT HEMOGLOBIN-HEMACUE: Hemoglobin: 15 g/dL (ref 13.0–17.0)

## 2014-04-01 SURGERY — INCISION, PROSTATE, TRANSURETHRAL
Anesthesia: General | Site: Prostate

## 2014-04-01 MED ORDER — KETOROLAC TROMETHAMINE 30 MG/ML IJ SOLN
30.0000 mg | Freq: Four times a day (QID) | INTRAMUSCULAR | Status: AC
  Administered 2014-04-01 – 2014-04-02 (×4): 30 mg via INTRAVENOUS
  Filled 2014-04-01: qty 1

## 2014-04-01 MED ORDER — KETOROLAC TROMETHAMINE 30 MG/ML IJ SOLN
INTRAMUSCULAR | Status: AC
  Filled 2014-04-01: qty 1

## 2014-04-01 MED ORDER — ACETAMINOPHEN 500 MG PO TABS
1000.0000 mg | ORAL_TABLET | Freq: Four times a day (QID) | ORAL | Status: DC | PRN
  Filled 2014-04-01: qty 2

## 2014-04-01 MED ORDER — CIPROFLOXACIN HCL 500 MG PO TABS
ORAL_TABLET | ORAL | Status: AC
  Filled 2014-04-01: qty 1

## 2014-04-01 MED ORDER — DIPHENHYDRAMINE HCL 12.5 MG/5ML PO ELIX
12.5000 mg | ORAL_SOLUTION | Freq: Four times a day (QID) | ORAL | Status: DC | PRN
  Filled 2014-04-01: qty 10

## 2014-04-01 MED ORDER — CEFAZOLIN SODIUM-DEXTROSE 2-3 GM-% IV SOLR
INTRAVENOUS | Status: AC
  Filled 2014-04-01: qty 50

## 2014-04-01 MED ORDER — DEXAMETHASONE SODIUM PHOSPHATE 4 MG/ML IJ SOLN
INTRAMUSCULAR | Status: DC | PRN
  Administered 2014-04-01: 10 mg via INTRAVENOUS

## 2014-04-01 MED ORDER — KETOROLAC TROMETHAMINE 30 MG/ML IJ SOLN
INTRAMUSCULAR | Status: DC | PRN
  Administered 2014-04-01: 30 mg via INTRAVENOUS

## 2014-04-01 MED ORDER — GLYCOPYRROLATE 0.2 MG/ML IJ SOLN
INTRAMUSCULAR | Status: DC | PRN
  Administered 2014-04-01: 0.2 mg via INTRAVENOUS

## 2014-04-01 MED ORDER — MIDAZOLAM HCL 2 MG/2ML IJ SOLN
INTRAMUSCULAR | Status: AC
  Filled 2014-04-01: qty 2

## 2014-04-01 MED ORDER — LIDOCAINE HCL (CARDIAC) 20 MG/ML IV SOLN
INTRAVENOUS | Status: DC | PRN
  Administered 2014-04-01: 80 mg via INTRAVENOUS

## 2014-04-01 MED ORDER — ACETAMINOPHEN 325 MG PO TABS
650.0000 mg | ORAL_TABLET | ORAL | Status: DC | PRN
  Filled 2014-04-01: qty 2

## 2014-04-01 MED ORDER — FENTANYL CITRATE 0.05 MG/ML IJ SOLN
INTRAMUSCULAR | Status: DC | PRN
  Administered 2014-04-01: 50 ug via INTRAVENOUS
  Administered 2014-04-01 (×2): 25 ug via INTRAVENOUS

## 2014-04-01 MED ORDER — PANTOPRAZOLE SODIUM 40 MG PO TBEC
40.0000 mg | DELAYED_RELEASE_TABLET | Freq: Every day | ORAL | Status: DC
Start: 2014-04-01 — End: 2014-04-02
  Administered 2014-04-02: 40 mg via ORAL
  Filled 2014-04-01: qty 1

## 2014-04-01 MED ORDER — FENTANYL CITRATE 0.05 MG/ML IJ SOLN
INTRAMUSCULAR | Status: AC
  Filled 2014-04-01: qty 2

## 2014-04-01 MED ORDER — ACETAMINOPHEN 10 MG/ML IV SOLN
INTRAVENOUS | Status: DC | PRN
  Administered 2014-04-01: 1000 mg via INTRAVENOUS

## 2014-04-01 MED ORDER — SODIUM CHLORIDE 0.45 % IV SOLN
INTRAVENOUS | Status: DC
  Filled 2014-04-01: qty 1000

## 2014-04-01 MED ORDER — ONDANSETRON HCL 4 MG/2ML IJ SOLN
INTRAMUSCULAR | Status: DC | PRN
  Administered 2014-04-01: 4 mg via INTRAVENOUS

## 2014-04-01 MED ORDER — PROPOFOL 10 MG/ML IV BOLUS
INTRAVENOUS | Status: DC | PRN
  Administered 2014-04-01: 200 mg via INTRAVENOUS

## 2014-04-01 MED ORDER — CEFAZOLIN SODIUM-DEXTROSE 2-3 GM-% IV SOLR
2.0000 g | INTRAVENOUS | Status: AC
  Administered 2014-04-01: 2 g via INTRAVENOUS
  Filled 2014-04-01: qty 50

## 2014-04-01 MED ORDER — DIPHENHYDRAMINE HCL 50 MG/ML IJ SOLN
12.5000 mg | Freq: Four times a day (QID) | INTRAMUSCULAR | Status: DC | PRN
  Filled 2014-04-01: qty 0.5

## 2014-04-01 MED ORDER — MIDAZOLAM HCL 5 MG/5ML IJ SOLN
INTRAMUSCULAR | Status: DC | PRN
  Administered 2014-04-01: 2 mg via INTRAVENOUS

## 2014-04-01 MED ORDER — CIPROFLOXACIN HCL 500 MG PO TABS
500.0000 mg | ORAL_TABLET | Freq: Two times a day (BID) | ORAL | Status: DC
  Administered 2014-04-01 – 2014-04-02 (×2): 500 mg via ORAL
  Filled 2014-04-01: qty 1

## 2014-04-01 MED ORDER — ALPRAZOLAM 1 MG PO TABS
1.0000 mg | ORAL_TABLET | Freq: Two times a day (BID) | ORAL | Status: DC | PRN
  Filled 2014-04-01: qty 1

## 2014-04-01 MED ORDER — SODIUM CHLORIDE 0.9 % IR SOLN
Status: DC | PRN
  Administered 2014-04-01: 6000 mL

## 2014-04-01 MED ORDER — GABAPENTIN 300 MG PO CAPS
300.0000 mg | ORAL_CAPSULE | Freq: Three times a day (TID) | ORAL | Status: DC
  Administered 2014-04-02: 300 mg via ORAL
  Filled 2014-04-01: qty 1

## 2014-04-01 MED ORDER — LACTATED RINGERS IV SOLN
INTRAVENOUS | Status: DC
  Administered 2014-04-01: 08:00:00 via INTRAVENOUS
  Filled 2014-04-01: qty 1000

## 2014-04-01 MED ORDER — FENTANYL CITRATE 0.05 MG/ML IJ SOLN
25.0000 ug | INTRAMUSCULAR | Status: DC | PRN
  Administered 2014-04-01 (×2): 25 ug via INTRAVENOUS
  Filled 2014-04-01: qty 1

## 2014-04-01 MED ORDER — ONDANSETRON HCL 4 MG/2ML IJ SOLN
4.0000 mg | Freq: Once | INTRAMUSCULAR | Status: AC | PRN
  Filled 2014-04-01: qty 2

## 2014-04-01 MED ORDER — LEVOTHYROXINE SODIUM 25 MCG PO TABS
25.0000 ug | ORAL_TABLET | Freq: Every day | ORAL | Status: DC
  Administered 2014-04-02: 25 ug via ORAL
  Filled 2014-04-01: qty 1

## 2014-04-01 MED ORDER — FENTANYL CITRATE 0.05 MG/ML IJ SOLN
INTRAMUSCULAR | Status: AC
  Filled 2014-04-01: qty 4

## 2014-04-01 MED ORDER — HYOSCYAMINE SULFATE 0.125 MG SL SUBL
0.1250 mg | SUBLINGUAL_TABLET | SUBLINGUAL | Status: DC | PRN
  Filled 2014-04-01: qty 1

## 2014-04-01 SURGICAL SUPPLY — 43 items
BAG DRAIN URO-CYSTO SKYTR STRL (DRAIN) ×3 IMPLANT
BAG URINE DRAINAGE (UROLOGICAL SUPPLIES) ×3 IMPLANT
BAG URINE LEG 19OZ MD ST LTX (BAG) IMPLANT
BLADE SURG 15 STRL LF DISP TIS (BLADE) IMPLANT
BLADE SURG 15 STRL SS (BLADE)
BOOTIES KNEE HIGH SLOAN (MISCELLANEOUS) ×3 IMPLANT
CANISTER SUCT LVC 12 LTR MEDI- (MISCELLANEOUS) ×6 IMPLANT
CATH AINSWORTH 30CC 24FR (CATHETERS) IMPLANT
CATH FOLEY 2WAY SLVR  5CC 14FR (CATHETERS)
CATH FOLEY 2WAY SLVR  5CC 20FR (CATHETERS)
CATH FOLEY 2WAY SLVR 30CC 20FR (CATHETERS) ×3 IMPLANT
CATH FOLEY 2WAY SLVR 5CC 14FR (CATHETERS) IMPLANT
CATH FOLEY 2WAY SLVR 5CC 20FR (CATHETERS) IMPLANT
CATH HEMA 3WAY 30CC 24FR COUDE (CATHETERS) IMPLANT
CATH HEMA 3WAY 30CC 24FR RND (CATHETERS) IMPLANT
CATH SIMPLASTIC 24 30ML (CATHETERS) IMPLANT
CLOTH BEACON ORANGE TIMEOUT ST (SAFETY) ×3 IMPLANT
DRAPE CAMERA CLOSED 9X96 (DRAPES) ×3 IMPLANT
ELECT BUTTON HF 24-28F 2 30DE (ELECTRODE) IMPLANT
ELECT LOOP HF 24-28F (CUTTING LOOP) IMPLANT
ELECT LOOP MED HF 24F 12D CBL (CLIP) IMPLANT
ELECT NEEDLE 45D HF 24-28F 12D (CUTTING LOOP) IMPLANT
ELECT REM PT RETURN 9FT ADLT (ELECTROSURGICAL) ×3
ELECT RESECT VAPORIZE 12D CBL (ELECTRODE) ×3 IMPLANT
ELECTRODE REM PT RTRN 9FT ADLT (ELECTROSURGICAL) ×1 IMPLANT
GLOVE BIO SURGEON STRL SZ7.5 (GLOVE) ×3 IMPLANT
GLOVE BIOGEL M 6.5 STRL (GLOVE) ×3 IMPLANT
GLOVE BIOGEL PI IND STRL 6.5 (GLOVE) ×2 IMPLANT
GLOVE BIOGEL PI INDICATOR 6.5 (GLOVE) ×4
GOWN PREVENTION PLUS LG XLONG (DISPOSABLE) ×3 IMPLANT
GOWN STRL REIN XL XLG (GOWN DISPOSABLE) IMPLANT
GOWN STRL REUS W/TWL LRG LVL3 (GOWN DISPOSABLE) ×3 IMPLANT
GOWN STRL REUS W/TWL XL LVL3 (GOWN DISPOSABLE) ×3 IMPLANT
HOLDER FOLEY CATH W/STRAP (MISCELLANEOUS) ×3 IMPLANT
IV NS IRRIG 3000ML ARTHROMATIC (IV SOLUTION) ×6 IMPLANT
LOOP CUTTING 24FR OLYMPUS (CUTTING LOOP) IMPLANT
PACK CYSTO (CUSTOM PROCEDURE TRAY) ×3 IMPLANT
PLUG CATH AND CAP STER (CATHETERS) IMPLANT
SET ASPIRATION TUBING (TUBING) ×3 IMPLANT
SUT ETHILON 3 0 PS 1 (SUTURE) IMPLANT
SUT SILK 0 TIES 10X30 (SUTURE) IMPLANT
SYR 30ML LL (SYRINGE) IMPLANT
SYRINGE IRR TOOMEY STRL 70CC (SYRINGE) ×3 IMPLANT

## 2014-04-01 NOTE — Interval H&P Note (Signed)
History and Physical Interval Note:  04/01/2014 9:33 AM  Marvin Gill  has presented today for surgery, with the diagnosis of Benign Prostatic Hyperplasia  The various methods of treatment have been discussed with the patient and family. After consideration of risks, benefits and other options for treatment, the patient has consented to  Procedure(s): [place] (TUIP) (N/A) as a surgical intervention .  The patient's history has been reviewed, patient examined, no change in status, stable for surgery.  I have reviewed the patient's chart and labs.  Questions were answered to the patient's satisfaction.     Carolan Clines I

## 2014-04-01 NOTE — Anesthesia Postprocedure Evaluation (Signed)
  Anesthesia Post-op Note  Patient: Marvin Gill  Procedure(s) Performed: Procedure(s) (LRB): TRANSURETHRAL INCISION OF THE PROSTATE (TUIP) (N/A)  Patient Location: PACU  Anesthesia Type: General  Level of Consciousness: awake and alert   Airway and Oxygen Therapy: Patient Spontanous Breathing  Post-op Pain: mild  Post-op Assessment: Post-op Vital signs reviewed, Patient's Cardiovascular Status Stable, Respiratory Function Stable, Patent Airway and No signs of Nausea or vomiting  Last Vitals:  Filed Vitals:   04/01/14 0945  BP: 109/72  Pulse:   Temp:   Resp:     Post-op Vital Signs: stable   Complications: No apparent anesthesia complications

## 2014-04-01 NOTE — Transfer of Care (Signed)
Immediate Anesthesia Transfer of Care Note  Patient: Marvin Gill  Procedure(s) Performed: Procedure(s) (LRB): TRANSURETHRAL INCISION OF THE PROSTATE (TUIP) (N/A)  Patient Location: PACU  Anesthesia Type: General  Level of Consciousness: awake, alert  and oriented  Airway & Oxygen Therapy: Patient Spontanous Breathing and Patient connected to face mask oxygen, oral airway removed.  Post-op Assessment: Report given to PACU RN and Post -op Vital signs reviewed and stable  Post vital signs: Reviewed and stable  Complications: No apparent anesthesia complications

## 2014-04-01 NOTE — Op Note (Signed)
Pre-operative diagnosis :   BPH  Postoperative diagnosis: Same Operation: Cystoscopy, Transurethral vaporization of the bladder neck  Surgeon:  S. [name], MD  First assistant:  Dr. Baltazar Najjar   Anesthesia:  Gen LMA  Preparation:  After appropriate pre-medication, the patient was brought to the operative room, placed on the operating table in the dorsal supine position where general LMA anesthesia was introduced. He was replaced in the dorsal lithotomy position where the pubis was prepped with Betadine solution and draped in usual fashion. The arm band was double checked. The history was double checked.  Review history:   63 year old male returns today a cystoscopy, flowrate, PVR & prostate u/s for size to be considered for surgery (microwave vs TURP vs open prostatectomy). Hx of BPH (taking Tamsulosin) & kidney stones. 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 Denies hematuria or dysuria. Does admit to 3 cups of coffee/day, 1 soda/day, 1 glass of wine occasionally, and 1 bottle of water/day. His IPSS has increased to 16/7. He now has insurance and wants to consider surgery for his prostate.   Note :fibromyalgia, taking gabapentin 300mg /day.    anxiety , Rx xanax   Hypothyroid, thyroxin 25 ug   GERD, Rx Nexium, 40mg /day   ASA 81mg /day ( hx ASVD)        01/25/14 PSA - 0.54  08/14/12 PSA - 0.50  12/16/10 PSA - 0.57  10/31/09 PSA - 0.52    Statement of  Likelihood of Success: Excellent. TIME-OUT observed.:  Procedure:  Cystourethroscopy was a copy was, showing normalpenile glans. The pendulous urethra was normal. The proximal urethra was normal. The prostatic urethra showed obstruction secondary to elevated bladder neck. The bladder showed trabeculation, but no cerebral formation was identified. There was no evidence of bladder stone or tumor. There was clear reflux from both orifices, located normally on the trigone.  Using the gyrus  instruments, vaporization at the bladder neck was accomplished at the 5:00 position, and the at the 7:00 position. No bleeding was noted. The remaining portion of the bladder neck was left intact. The lateral lobes were left intact. This was in an effort to have the patient retained his antegrade ejaculatory process.  A size 20 Foley catheter with 30 mL balloon was inserted. Irrigation showed clear urine. The patient received 1 g of IV Tylenol, and 30 mg of IV Toradol. He was awakened and taken to recovery room in good condition. No traction was necessary.

## 2014-04-01 NOTE — Anesthesia Procedure Notes (Signed)
Procedure Name: LMA Insertion Date/Time: 04/01/2014 9:00 AM Performed by: Mechele Claude Pre-anesthesia Checklist: Patient identified, Emergency Drugs available, Suction available and Patient being monitored Patient Re-evaluated:Patient Re-evaluated prior to inductionOxygen Delivery Method: Circle System Utilized Preoxygenation: Pre-oxygenation with 100% oxygen Intubation Type: IV induction Ventilation: Mask ventilation without difficulty LMA: LMA inserted LMA Size: 5.0 Number of attempts: 1 Airway Equipment and Method: bite block Placement Confirmation: positive ETCO2 Tube secured with: Tape Dental Injury: Teeth and Oropharynx as per pre-operative assessment

## 2014-04-01 NOTE — H&P (Signed)
Reason For Visit Cystoscopy, flowrate, PVR, & PUS   Active Problems Problems  1. Benign localized prostatic hyperplasia with lower urinary tract symptoms (LUTS) (N40.1)   Assessed By: Carolan Clines (Urology); Last Assessed: 13 Mar 2014 2. Nocturia (R35.1)   Assessed By: Carolan Clines (Urology); Last Assessed: 13 Mar 2014  History of Present Illness    63 year old male returns today a cystoscopy, flowrate, PVR & prostate u/s for size to be considered for surgery (microwave vs TURP vs open prostatectomy). Hx of BPH (taking Tamsulosin) & kidney stones. 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 Denies hematuria or dysuria. Does admit to 3 cups of coffee/day, 1 soda/day, 1 glass of wine occasionally, and 1 bottle of water/day. His IPSS has increased to 16/7. He now has insurance and wants to consider surgery for his prostate.    Note :fibromyalgia, taking gabapentin 300mg /day.        anxiety , Rx xanax       Hypothyroid, thyroxin 25 ug       GERD, Rx Nexium, 40mg /day        ASA 81mg /day ( hx ASVD)                   01/25/14 PSA - 0.54  08/14/12 PSA - 0.50  12/16/10 PSA - 0.57  10/31/09 PSA - 0.52   Past Medical History Problems  1. History of Acute Myocardial Infarction 2. History of Anxiety (F41.9) 3. History of Arthritis 4. History of heartburn (G40.102)  Surgical History Problems  1. History of Heart Surgery  Current Meds 1. ALPRAZolam 1 MG Oral Tablet;  Therapy: (VOZDGUYQ:[id number]) to Recorded 2. Aspirin 81 MG Oral Tablet;  Therapy: (Recorded:22Sep2009) to Recorded 3. Dexilant 60 MG Oral Capsule Delayed Release;  Therapy: (GLOVFIEP:[id number]) to Recorded 4. Omeprazole 40 MG Oral Capsule Delayed Release;  Therapy: (YSAYTKZS:[id number]) to Recorded 5. Tamsulosin HCl - 0.4 MG Oral Capsule; Take 1 capsule by mouth every day;  Therapy: [id number] to (Evaluate:04Sep2016)  Requested for: 10Sep2015; Last  Rx:10Sep2015  Ordered  Allergies Medication  1. Codeine Derivatives 2. Rapaflo CAPS  Family History Problems  1. Family history of Brain Tumor 2. Family history of Family Health Status - Father's Age : Mother   64 3. Family history of Family Health Status Number Of Children   1 son / 1 daugh 4. Family history of Mother Deceased At Age ____   61 Brain tumor  Social History Problems  1. Alcohol Use   1 glass of wine occassionally 2. Caffeine Use   coffee/=3 per day 1 soda/day 3. Tobacco Use   1 pk for 30 yrs, quit 8 yrs ago  Review of Systems Genitourinary, constitutional, skin, eye, otolaryngeal, hematologic/lymphatic, cardiovascular, pulmonary, endocrine, musculoskeletal, gastrointestinal, neurological and psychiatric system(s) were reviewed and pertinent findings if present are noted.  Genitourinary: urinary frequency, feelings of urinary urgency, nocturia, weak urinary stream, urinary stream starts and stops, incomplete emptying of bladder and initiating urination requires straining.    Vitals Vital Signs [Data Includes: Last 1 Day]  Recorded: 14Oct2015 02:47PM  Blood Pressure: 149 / 92 Temperature: 97.7 F Heart Rate: 91  Physical Exam Constitutional: Well nourished and well developed . No acute distress.  ENT:. The ears and nose are normal in appearance.  Neck: The appearance of the neck is normal and no neck mass is present.  Pulmonary: No respiratory distress and normal respiratory rhythm and effort.  Cardiovascular: Heart rate and rhythm are normal . No peripheral edema.  Abdomen: The abdomen  is soft and nontender. No masses are palpated. No CVA tenderness. No hernias are palpable. No hepatosplenomegaly noted.  Rectal: Rectal exam demonstrates normal sphincter tone, no tenderness and no masses. Estimated prostate size is 4+. Normal rectal tone, no rectal masses, prostate is smooth, symmetric and non-tender. The prostate has no nodularity and is not tender. The left seminal  vesicle is nonpalpable. The right seminal vesicle is nonpalpable. The perineum is normal on inspection.  Genitourinary: Examination of the penis demonstrates no discharge, no masses, no lesions and a normal meatus. The penis is circumcised. The scrotum is without lesions. The right epididymis is palpably normal and non-tender. The left epididymis is palpably normal and non-tender. The right testis is non-tender and without masses. The left testis is non-tender and without masses.  Lymphatics: The femoral and inguinal nodes are not enlarged or tender.  Skin: Normal skin turgor, no visible rash and no visible skin lesions.  Neuro/Psych:. Mood and affect are appropriate.    Results/Data  Flow Rate: Voided 188 ml. A peak flow rate of 81ml/s and mean flow rate of 9ml/s.  PVR: Ultrasound PVR 0 ml.    Procedure Prostate u/s today: Length - 4.63cm, Height - 4.14cm, and Width - 5.03cm. Total volume - 50.47 grams.   Procedure: Cystoscopy  Chaperone Present: kim lewis.  Indication: Lower Urinary Tract Symptoms.  Informed Consent: Risks, benefits, and potential adverse events were discussed and informed consent was obtained from the patient.  Prep: The patient was prepped with betadine.  Anesthesia:. Local anesthesia was administered intraurethrally with 2% lidocaine jelly.  Antibiotic prophylaxis: Ciprofloxacin.  Procedure Note:  Urethral meatus:. No abnormalities.  Anterior urethra: No abnormalities.  Prostatic urethra:. The lateral prostatic lobes were enlarged. No intravesical median lobe was visualized.  Bladder: Visulization was clear. The ureteral orifices were in the normal anatomic position bilaterally. A systematic survey of the bladder demonstrated no bladder tumors or stones. Examination of the bladder demonstrated no clot within the bladder, no trabeculation and no diverticulum no erythematous mucosa, no ulcer, no edema and no cellules. The patient tolerated the procedure well.   Complications: None.    Assessment Assessed  1. Benign localized prostatic hyperplasia with lower urinary tract symptoms (LUTS) (N40.1) 2. Nocturia (R35.1)  BPH in 50 gram prostate. He wants most minimally invasive surgery, but wants surgery and to stay overnight ( no one to take care of him at home). He will have TUIP and have foley overnight.   Plan  TUIP at [facility] and overnight stay with foley removal in AM. PROSTATE U/S; Status:Resulted - Requires Verification;  Done: [id number] 12:00AM  Due:16Oct2015; Marked Important;Ordered; Today;   OHY:WVPXTG localized prostatic hyperplasia with lower urinary tract symptoms (LUTS); Ordered GY:IRSWNIOEVO, Alessia Gonsalez;   Electronics engineer signed by : Carolan Clines, M.D.; Mar 13 2014  4:42PM EST  Reason For Visit Cystoscopy, flowrate, PVR, & PUS   Active Problems Problems  1. Benign localized prostatic hyperplasia with lower urinary tract symptoms (LUTS) (N40.1)   Assessed By: Carolan Clines (Urology); Last Assessed: 13 Mar 2014 2. Nocturia (R35.1)   Assessed By: Carolan Clines (Urology); Last Assessed: 13 Mar 2014  History of Present Illness    63 year old male returns today a cystoscopy, flowrate, PVR & prostate u/s for size to be considered for surgery (microwave vs TURP vs open prostatectomy). Hx of BPH (taking Tamsulosin) & kidney stones. 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 Denies hematuria or dysuria. Does admit to 3 cups of coffee/day, 1 soda/day, 1 glass  of wine occasionally, and 1 bottle of water/day. His IPSS has increased to 16/7. He now has insurance and wants to consider surgery for his prostate.    Note :fibromyalgia, taking gabapentin 300mg /day.        anxiety , Rx xanax       Hypothyroid, thyroxin 25 ug       GERD, Rx Nexium, 40mg /day        ASA 81mg /day ( hx ASVD)                   01/25/14 PSA - 0.54  08/14/12 PSA - 0.50  12/16/10 PSA - 0.57  10/31/09 PSA - 0.52    Past Medical History Problems  1. History of Acute Myocardial Infarction 2. History of Anxiety (F41.9) 3. History of Arthritis 4. History of heartburn (B76.283)  Surgical History Problems  1. History of Heart Surgery  Current Meds 1. ALPRAZolam 1 MG Oral Tablet;  Therapy: (TDVVOHYW:[id number]) to Recorded 2. Aspirin 81 MG Oral Tablet;  Therapy: (Recorded:22Sep2009) to Recorded 3. Dexilant 60 MG Oral Capsule Delayed Release;  Therapy: (SWNIOEVO:[id number]) to Recorded 4. Omeprazole 40 MG Oral Capsule Delayed Release;  Therapy: (EXHBZJIR:[id number]) to Recorded 5. Tamsulosin HCl - 0.4 MG Oral Capsule; Take 1 capsule by mouth every day;  Therapy: [phone] to (Evaluate:04Sep2016)  Requested for: 10Sep2015; Last  Rx:10Sep2015 Ordered  Allergies Medication  1. Codeine Derivatives 2. Rapaflo CAPS  Family History Problems  1. Family history of Brain Tumor 2. Family history of Family Health Status - Father's Age : Mother   82 3. Family history of Family Health Status Number Of Children   1 son / 1 daugh 4. Family history of Mother Deceased At Age ____   50 Brain tumor  Social History Problems  1. Alcohol Use   1 glass of wine occassionally 2. Caffeine Use   coffee/=3 per day 1 soda/day 3. Tobacco Use   1 pk for 30 yrs, quit 8 yrs ago  Review of Systems Genitourinary, constitutional, skin, eye, otolaryngeal, hematologic/lymphatic, cardiovascular, pulmonary, endocrine, musculoskeletal, gastrointestinal, neurological and psychiatric system(s) were reviewed and pertinent findings if present are noted.  Genitourinary: urinary frequency, feelings of urinary urgency, nocturia, weak urinary stream, urinary stream starts and stops, incomplete emptying of bladder and initiating urination requires straining.    Vitals Vital Signs [Data Includes: Last 1 Day]  Recorded: 14Oct2015 02:47PM  Blood Pressure: 149 / 92 Temperature: 97.7 F Heart Rate: 91  Physical  Exam Constitutional: Well nourished and well developed . No acute distress.  ENT:. The ears and nose are normal in appearance.  Neck: The appearance of the neck is normal and no neck mass is present.  Pulmonary: No respiratory distress and normal respiratory rhythm and effort.  Cardiovascular: Heart rate and rhythm are normal . No peripheral edema.  Abdomen: The abdomen is soft and nontender. No masses are palpated. No CVA tenderness. No hernias are palpable. No hepatosplenomegaly noted.  Rectal: Rectal exam demonstrates normal sphincter tone, no tenderness and no masses. Estimated prostate size is 4+. Normal rectal tone, no rectal masses, prostate is smooth, symmetric and non-tender. The prostate has no nodularity and is not tender. The left seminal vesicle is nonpalpable. The right seminal vesicle is nonpalpable. The perineum is normal on inspection.  Genitourinary: Examination of the penis demonstrates no discharge, no masses, no lesions and a normal meatus. The penis is circumcised. The scrotum is without lesions. The right epididymis is palpably normal and non-tender. The left epididymis is palpably normal and  non-tender. The right testis is non-tender and without masses. The left testis is non-tender and without masses.  Lymphatics: The femoral and inguinal nodes are not enlarged or tender.  Skin: Normal skin turgor, no visible rash and no visible skin lesions.  Neuro/Psych:. Mood and affect are appropriate.    Results/Data  Flow Rate: Voided 188 ml. A peak flow rate of 68ml/s and mean flow rate of 23ml/s.  PVR: Ultrasound PVR 0 ml.    Procedure Prostate u/s today: Length - 4.63cm, Height - 4.14cm, and Width - 5.03cm. Total volume - 50.47 grams.   Procedure: Cystoscopy  Chaperone Present: kim lewis.  Indication: Lower Urinary Tract Symptoms.  Informed Consent: Risks, benefits, and potential adverse events were discussed and informed consent was obtained from the patient.  Prep: The  patient was prepped with betadine.  Anesthesia:. Local anesthesia was administered intraurethrally with 2% lidocaine jelly.  Antibiotic prophylaxis: Ciprofloxacin.  Procedure Note:  Urethral meatus:. No abnormalities.  Anterior urethra: No abnormalities.  Prostatic urethra:. The lateral prostatic lobes were enlarged. No intravesical median lobe was visualized.  Bladder: Visulization was clear. The ureteral orifices were in the normal anatomic position bilaterally. A systematic survey of the bladder demonstrated no bladder tumors or stones. Examination of the bladder demonstrated no clot within the bladder, no trabeculation and no diverticulum no erythematous mucosa, no ulcer, no edema and no cellules. The patient tolerated the procedure well.  Complications: None.    Assessment Assessed  1. Benign localized prostatic hyperplasia with lower urinary tract symptoms (LUTS) (N40.1) 2. Nocturia (R35.1)  BPH in 50 gram prostate. He wants most minimally invasive surgery, but wants surgery and to stay overnight ( no one to take care of him at home). He will have TUIP and have foley overnight.   Plan  TUIP at [facility] and overnight stay with foley removal in AM. PROSTATE U/S; Status:Resulted - Requires Verification;  Done: [id number] 12:00AM  Due:16Oct2015; Marked Important;Ordered; Today;   VOZ:DGUYQI localized prostatic hyperplasia with lower urinary tract symptoms (LUTS); Ordered HK:VQQVZDGLOV, Liahm Grivas;   Electronics engineer signed by : Carolan Clines, M.D.; Mar 13 2014  4:42PM EST

## 2014-04-02 DIAGNOSIS — N400 Enlarged prostate without lower urinary tract symptoms: Secondary | ICD-10-CM | POA: Diagnosis not present

## 2014-04-02 MED ORDER — MELOXICAM 15 MG PO TABS: 15.0000 mg | ORAL_TABLET | Freq: Every day | ORAL | Status: DC

## 2014-04-02 MED ORDER — KETOROLAC TROMETHAMINE 30 MG/ML IJ SOLN
INTRAMUSCULAR | Status: AC
  Filled 2014-04-02: qty 1

## 2014-04-02 MED ORDER — PHENAZOPYRIDINE HCL 200 MG PO TABS: 200.0000 mg | ORAL_TABLET | Freq: Three times a day (TID) | ORAL | Status: DC | PRN

## 2014-04-02 MED ORDER — CIPROFLOXACIN HCL 500 MG PO TABS
ORAL_TABLET | ORAL | Status: AC
  Filled 2014-04-02: qty 1

## 2014-04-02 NOTE — Discharge Instructions (Addendum)
Benign Prostatic Hyperplasia An enlarged prostate (benign prostatic hyperplasia) is common in older men. You may experience the following:  Weak urine stream.  Dribbling.  Feeling like the bladder has not emptied completely.  Difficulty starting urination.  Getting up frequently at night to urinate.  Urinating more frequently during the day. HOME CARE INSTRUCTIONS  Monitor your prostatic hyperplasia for any changes. The following actions may help to alleviate any discomfort you are experiencing:  Give yourself time when you urinate.  Stay away from alcohol.  Avoid beverages containing caffeine, such as coffee, tea, and colas, because they can make the problem worse.  Avoid decongestants, antihistamines, and some prescription medicines that can make the problem worse.  Follow up with your health care provider for further treatment as recommended. SEEK MEDICAL CARE IF:  You are experiencing progressive difficulty voiding.  Your urine stream is progressively getting narrower.  You are awaking from sleep with the urge to void more frequently.  You are constantly feeling the need to void.  You experience loss of urine, especially in small amounts. SEEK IMMEDIATE MEDICAL CARE IF:   You develop increased pain with urination or are unable to urinate.  You develop severe abdominal pain, vomiting, a high fever, or fainting.  You develop back pain or blood in your urine. MAKE SURE YOU:   Understand these instructions.  Will watch your condition.  Will get help right away if you are not doing well or get worse. Document Released: 05/17/2005 Document Revised: 01/17/2013 Document Reviewed: 10/17/2012 [facility] Patient Information 2015 [place], [place]. This information is not intended to replace advice given to you by your health care provider. Make sure you discuss any questions you have with your health care provider.   Call your surgeon if you experience:   1.  Fever over  101.0. 2.  Inability to urinate. 3.  Nausea and/or vomiting. 4.  Extreme swelling or bruising at the surgical site. 5.  Continued bleeding from the incision. 6.  Increased pain, redness or drainage from the incision. 7.  Problems related to your pain medication. 8. Any change in color, movement and/or sensation 9. Any problems and/or concerns

## 2014-04-02 NOTE — Progress Notes (Signed)
Foley catheter removed per order.  Pt tolerated procedure well w/ some anxiety.  Pt c/o burning in penis afterwards.  toradol iv given as ordered.

## 2014-04-06 ENCOUNTER — Encounter (HOSPITAL_BASED_OUTPATIENT_CLINIC_OR_DEPARTMENT_OTHER): Payer: Self-pay | Admitting: Urology

## 2014-06-02 ENCOUNTER — Encounter (HOSPITAL_COMMUNITY): Payer: Self-pay

## 2014-06-02 ENCOUNTER — Emergency Department (HOSPITAL_COMMUNITY)
Admission: EM | Admit: 2014-06-02 | Discharge: 2014-06-02 | Disposition: A | Discharge status: Home / Self Care | Payer: Medicare HMO | Attending: Emergency Medicine | Admitting: Emergency Medicine

## 2014-06-02 DIAGNOSIS — Z79899 Other long term (current) drug therapy: Secondary | ICD-10-CM | POA: Diagnosis not present

## 2014-06-02 DIAGNOSIS — R339 Retention of urine, unspecified: Secondary | ICD-10-CM | POA: Insufficient documentation

## 2014-06-02 DIAGNOSIS — E039 Hypothyroidism, unspecified: Secondary | ICD-10-CM | POA: Insufficient documentation

## 2014-06-02 DIAGNOSIS — Z791 Long term (current) use of non-steroidal anti-inflammatories (NSAID): Secondary | ICD-10-CM | POA: Insufficient documentation

## 2014-06-02 DIAGNOSIS — M479 Spondylosis, unspecified: Secondary | ICD-10-CM | POA: Insufficient documentation

## 2014-06-02 DIAGNOSIS — Z87891 Personal history of nicotine dependence: Secondary | ICD-10-CM | POA: Insufficient documentation

## 2014-06-02 DIAGNOSIS — Z9861 Coronary angioplasty status: Secondary | ICD-10-CM | POA: Insufficient documentation

## 2014-06-02 DIAGNOSIS — Z9889 Other specified postprocedural states: Secondary | ICD-10-CM | POA: Diagnosis not present

## 2014-06-02 DIAGNOSIS — Z87448 Personal history of other diseases of urinary system: Secondary | ICD-10-CM | POA: Diagnosis not present

## 2014-06-02 DIAGNOSIS — M797 Fibromyalgia: Secondary | ICD-10-CM | POA: Diagnosis not present

## 2014-06-02 DIAGNOSIS — I2510 Atherosclerotic heart disease of native coronary artery without angina pectoris: Secondary | ICD-10-CM | POA: Insufficient documentation

## 2014-06-02 DIAGNOSIS — K219 Gastro-esophageal reflux disease without esophagitis: Secondary | ICD-10-CM | POA: Insufficient documentation

## 2014-06-02 LAB — URINALYSIS, ROUTINE W REFLEX MICROSCOPIC
Bilirubin Urine: NEGATIVE
Glucose, UA: NEGATIVE mg/dL
Ketones, ur: NEGATIVE mg/dL
LEUKOCYTES UA: NEGATIVE
NITRITE: NEGATIVE
PH: 6.5 (ref 5.0–8.0)
Protein, ur: NEGATIVE mg/dL
SPECIFIC GRAVITY, URINE: 1.018 (ref 1.005–1.030)
UROBILINOGEN UA: 0.2 mg/dL (ref 0.0–1.0)

## 2014-06-02 LAB — URINE MICROSCOPIC-ADD ON

## 2014-06-02 MED ORDER — LIDOCAINE HCL 2 % EX GEL
1.0000 "application " | Freq: Once | CUTANEOUS | Status: AC
  Administered 2014-06-02: 1 via TOPICAL
  Filled 2014-06-02: qty 20

## 2014-06-02 NOTE — Discharge Instructions (Signed)
Acute Urinary Retention °Acute urinary retention is the temporary inability to urinate. °This is a common problem in older men. As men age their prostates become larger and block the flow of urine from the bladder. This is usually a problem that has come on gradually.  °HOME CARE INSTRUCTIONS °If you are sent home with a Foley catheter and a drainage system, you will need to discuss the best course of action with your health care provider. While the catheter is in, maintain a good intake of fluids. Keep the drainage bag emptied and lower than your catheter. This is so that contaminated urine will not flow back into your bladder, which could lead to a urinary tract infection. °There are two main types of drainage bags. One is a large bag that usually is used at night. It has a good capacity that will allow you to sleep through the night without having to empty it. The second type is called a leg bag. It has a smaller capacity, so it needs to be emptied more frequently. However, the main advantage is that it can be attached by a leg strap and can go underneath your clothing, allowing you the freedom to move about or leave your home. °Only take over-the-counter or prescription medicines for pain, discomfort, or fever as directed by your health care provider.  °SEEK MEDICAL CARE IF: °· You develop a low-grade fever. °· You experience spasms or leakage of urine with the spasms. °SEEK IMMEDIATE MEDICAL CARE IF:  °· You develop chills or fever. °· Your catheter stops draining urine. °· Your catheter falls out. °· You start to develop increased bleeding that does not respond to rest and increased fluid intake. °MAKE SURE YOU: °· Understand these instructions. °· Will watch your condition. °· Will get help right away if you are not doing well or get worse. °Document Released: 08/23/2000 Document Revised: 05/22/2013 Document Reviewed: 10/26/2012 °[employer]® Patient Information ©2015 [employer]. This information is not  intended to replace advice given to you by your health care provider. Make sure you discuss any questions you have with your health care provider. ° °

## 2014-06-02 NOTE — ED Notes (Signed)
Attached leg bag to foley catheter, teach back method used for education. Pt verbalized understanding. NAD at this time.

## 2014-06-02 NOTE — ED Provider Notes (Addendum)
CSN: [id number]     Arrival date & time 06/02/14  6295 History   First MD Initiated Contact with Patient 06/02/14 [phone]     Chief Complaint  Patient presents with  . Urinary Retention     (Consider location/radiation/quality/duration/timing/severity/associated sxs/prior Treatment) HPI Comments: Pt here with sx of urinary retention.  Unable to urinate since last night.  Having pressure and distention of the abd.  Had TURP in Nov and initially improved but pt having dysuria over the last month and over the last 1-2 weeks noted decreased strength of urine stream.  Denies fever, back pain or N/V.  The history is provided by the patient.    Past Medical History  Diagnosis Date  . Hypothyroidism   . GERD (gastroesophageal reflux disease)   . Arthritis     back  . Fibromyalgia   . BPH (benign prostatic hypertrophy)   . Coronary artery disease     single vessel cad  . S/P percutaneous transluminal coronary angioplasty     05-18-1999  to dLAD  . Nocturia   . Urinary urgency   . Wears glasses    Past Surgical History  Procedure Laterality Date  . Colonoscopy with propofol N/A 12/13/2013    Procedure: COLONOSCOPY WITH PROPOFOL;  Surgeon: Garlan Fair, MD;  Location: [facility];  Service: Endoscopy;  Laterality: N/A;  . Coronary angioplasty  05-18-1999  dr Tami Ribas    PTCA balloon to dLAD 80% (single vessel disease),  ef 60%  . Cardiac catheterization  06-14-2001  dr Einar Gip    normal coronary arteries and patent previous dLAD   . Esophagogastroduodenoscopy  08-24-2006  . Transurethral incision of prostate N/A 04/01/2014    Procedure: TRANSURETHRAL INCISION OF THE PROSTATE (TUIP);  Surgeon: Ailene Rud, MD;  Location: [facility];  Service: Urology;  Laterality: N/A;   No family history on file. History  Substance Use Topics  . Smoking status: Former Smoker -- 1.00 packs/day for 25 years    Types: Cigarettes    Quit date: 06/01/2003  . Smokeless tobacco:  Never Used  . Alcohol Use: No    Review of Systems  All other systems reviewed and are negative.     Allergies  Codeine  Home Medications   Prior to Admission medications   Medication Sig Start Date End Date Taking? Authorizing Provider  acetaminophen (TYLENOL) 500 MG tablet Take 1,000 mg by mouth every 6 (six) hours as needed for mild pain or moderate pain.    Historical Provider, MD  ALPRAZolam Duanne Moron) 1 MG tablet Take 1 mg by mouth 2 (two) times daily as needed for anxiety.    Historical Provider, MD  esomeprazole (NEXIUM) 40 MG capsule Take 40 mg by mouth every morning.     Historical Provider, MD  gabapentin (NEURONTIN) 300 MG capsule Take 300 mg by mouth 3 (three) times daily.    Historical Provider, MD  levothyroxine (SYNTHROID, LEVOTHROID) 25 MCG tablet Take 25 mcg by mouth daily before breakfast.    Historical Provider, MD  meloxicam (MOBIC) 15 MG tablet Take 1 tablet (15 mg total) by mouth daily. 04/02/14   Ailene Rud, MD  phenazopyridine (PYRIDIUM) 200 MG tablet Take 1 tablet (200 mg total) by mouth 3 (three) times daily as needed for pain. 04/02/14   Ailene Rud, MD   BP 135/97 mmHg  Pulse 70  Temp(Src) 97.4 F (36.3 C) (Oral)  Resp 18  Ht 6\' 5"  (1.956 m)  Wt 235 lb (106.595  kg)  BMI 27.86 kg/m2  SpO2 98% Physical Exam  Constitutional: He is oriented to person, place, and time. He appears well-developed and well-nourished. No distress.  HENT:  Head: Normocephalic and atraumatic.  Mouth/Throat: Oropharynx is clear and moist.  Eyes: Conjunctivae and EOM are normal. Pupils are equal, round, and reactive to light.  Neck: Normal range of motion. Neck supple.  Cardiovascular: Normal rate, regular rhythm and intact distal pulses.   No murmur heard. Pulmonary/Chest: Effort normal and breath sounds normal. No respiratory distress. He has no wheezes. He has no rales.  Abdominal: Soft. He exhibits distension. There is tenderness in the suprapubic area.  There is no rebound, no guarding and no CVA tenderness.  Musculoskeletal: Normal range of motion. He exhibits no edema or tenderness.  Neurological: He is alert and oriented to person, place, and time.  Skin: Skin is warm and dry. No rash noted. No erythema.  Psychiatric: He has a normal mood and affect. His behavior is normal.  Nursing note and vitals reviewed.   ED Course  Procedures (including critical care time) Labs Review Labs Reviewed  URINALYSIS, ROUTINE W REFLEX MICROSCOPIC - Abnormal; Notable for the following:    Hgb urine dipstick SMALL (*)    All other components within normal limits  URINE CULTURE  URINE MICROSCOPIC-ADD ON    Imaging Review No results found.   EKG Interpretation None      MDM   Final diagnoses:  Urinary retention    Patient here with symptoms consistent with urinary retention. For one month he is noticing mild dysuria. In November he underwent a TURP procedure and initially had been doing well however over the last 1-2 weeks he had noted weakening of his urine stream and he has not been able to urinate since last night. On exam with bedside ultrasound he has evidence of urinary retention. Foley catheter placed urine sent for analysis. He denies any symptoms concerning for pyelonephritis. Concern for possible UTI causing retention.  8:54 AM No signs of infection at this time.  Will have pt f/u with tannenbaum.  Patient has Flomax at home and will start taking that again until he is seen by urology  Blanchie Dessert, MD 06/02/14 1950  Blanchie Dessert, MD 06/02/14 [phone]

## 2014-06-02 NOTE — ED Notes (Signed)
EDP Plunkett at bedside.  Bladder scan completed by EDP with greater than 1L of urine present.  Verbal order for lidocaine and foley to be placed.

## 2014-06-02 NOTE — ED Notes (Signed)
Pt states he woke this morning with the urge to use the restroom, but was unable to urinate.  Pt reports burning with urination x 1 month.

## 2014-06-03 LAB — URINE CULTURE
CULTURE: NO GROWTH
Colony Count: NO GROWTH

## 2014-06-07 DIAGNOSIS — R339 Retention of urine, unspecified: Secondary | ICD-10-CM | POA: Diagnosis not present

## 2014-07-02 ENCOUNTER — Other Ambulatory Visit: Payer: Self-pay | Admitting: Internal Medicine

## 2014-07-02 DIAGNOSIS — M169 Osteoarthritis of hip, unspecified: Secondary | ICD-10-CM | POA: Diagnosis not present

## 2014-07-02 DIAGNOSIS — E039 Hypothyroidism, unspecified: Secondary | ICD-10-CM | POA: Diagnosis not present

## 2014-07-02 DIAGNOSIS — Z87891 Personal history of nicotine dependence: Secondary | ICD-10-CM

## 2014-07-02 DIAGNOSIS — Z1389 Encounter for screening for other disorder: Secondary | ICD-10-CM | POA: Diagnosis not present

## 2014-07-02 DIAGNOSIS — N400 Enlarged prostate without lower urinary tract symptoms: Secondary | ICD-10-CM | POA: Diagnosis not present

## 2014-07-02 DIAGNOSIS — M797 Fibromyalgia: Secondary | ICD-10-CM | POA: Diagnosis not present

## 2014-07-02 DIAGNOSIS — I2510 Atherosclerotic heart disease of native coronary artery without angina pectoris: Secondary | ICD-10-CM | POA: Diagnosis not present

## 2014-07-02 DIAGNOSIS — K219 Gastro-esophageal reflux disease without esophagitis: Secondary | ICD-10-CM | POA: Diagnosis not present

## 2014-07-02 DIAGNOSIS — Z0000 Encounter for general adult medical examination without abnormal findings: Secondary | ICD-10-CM | POA: Diagnosis not present

## 2014-07-08 ENCOUNTER — Ambulatory Visit
Admission: RE | Admit: 2014-07-08 | Discharge: 2014-07-08 | Disposition: A | Discharge status: Home / Self Care | Payer: Commercial Managed Care - HMO | Source: Ambulatory Visit | Attending: Internal Medicine | Admitting: Internal Medicine

## 2014-07-08 DIAGNOSIS — I714 Abdominal aortic aneurysm, without rupture: Secondary | ICD-10-CM | POA: Diagnosis not present

## 2014-07-08 DIAGNOSIS — Z87891 Personal history of nicotine dependence: Secondary | ICD-10-CM

## 2014-07-08 DIAGNOSIS — F17200 Nicotine dependence, unspecified, uncomplicated: Secondary | ICD-10-CM | POA: Diagnosis not present

## 2014-07-11 DIAGNOSIS — C44329 Squamous cell carcinoma of skin of other parts of face: Secondary | ICD-10-CM | POA: Diagnosis not present

## 2014-07-11 DIAGNOSIS — C44129 Squamous cell carcinoma of skin of left eyelid, including canthus: Secondary | ICD-10-CM | POA: Diagnosis not present

## 2014-07-11 DIAGNOSIS — L820 Inflamed seborrheic keratosis: Secondary | ICD-10-CM | POA: Diagnosis not present

## 2014-07-11 DIAGNOSIS — L570 Actinic keratosis: Secondary | ICD-10-CM | POA: Diagnosis not present

## 2014-07-22 DIAGNOSIS — M791 Myalgia: Secondary | ICD-10-CM | POA: Diagnosis not present

## 2014-07-22 DIAGNOSIS — R300 Dysuria: Secondary | ICD-10-CM | POA: Diagnosis not present

## 2014-08-02 DIAGNOSIS — R350 Frequency of micturition: Secondary | ICD-10-CM | POA: Diagnosis not present

## 2014-08-02 DIAGNOSIS — N401 Enlarged prostate with lower urinary tract symptoms: Secondary | ICD-10-CM | POA: Diagnosis not present

## 2014-08-02 DIAGNOSIS — R339 Retention of urine, unspecified: Secondary | ICD-10-CM | POA: Diagnosis not present

## 2014-08-13 DIAGNOSIS — E780 Pure hypercholesterolemia: Secondary | ICD-10-CM | POA: Diagnosis not present

## 2014-08-13 DIAGNOSIS — R7989 Other specified abnormal findings of blood chemistry: Secondary | ICD-10-CM | POA: Diagnosis not present

## 2014-08-20 DIAGNOSIS — R197 Diarrhea, unspecified: Secondary | ICD-10-CM | POA: Diagnosis not present

## 2014-08-20 DIAGNOSIS — R5383 Other fatigue: Secondary | ICD-10-CM | POA: Diagnosis not present

## 2014-08-20 DIAGNOSIS — R110 Nausea: Secondary | ICD-10-CM | POA: Diagnosis not present

## 2015-01-15 DIAGNOSIS — M545 Low back pain: Secondary | ICD-10-CM | POA: Diagnosis not present

## 2015-01-15 DIAGNOSIS — M797 Fibromyalgia: Secondary | ICD-10-CM | POA: Diagnosis not present

## 2015-02-21 ENCOUNTER — Encounter: Payer: Self-pay | Admitting: Cardiovascular Disease

## 2015-03-17 DIAGNOSIS — S91012A Laceration without foreign body, left ankle, initial encounter: Secondary | ICD-10-CM | POA: Diagnosis not present

## 2015-05-21 DIAGNOSIS — R351 Nocturia: Secondary | ICD-10-CM | POA: Diagnosis not present

## 2015-05-21 DIAGNOSIS — R350 Frequency of micturition: Secondary | ICD-10-CM | POA: Diagnosis not present

## 2015-05-21 DIAGNOSIS — N401 Enlarged prostate with lower urinary tract symptoms: Secondary | ICD-10-CM | POA: Diagnosis not present

## 2015-05-27 ENCOUNTER — Other Ambulatory Visit: Payer: Self-pay | Admitting: Urology

## 2015-06-13 NOTE — Patient Instructions (Signed)
YOUR PROCEDURE IS SCHEDULED ON :  06/23/15  REPORT TO [facility] MAIN ENTRANCE FOLLOW SIGNS TO EAST ELEVATOR - GO TO 3rd FLOOR CHECK IN AT 3 EAST NURSES STATION (SHORT STAY) AT:  10:00 AM  CALL THIS NUMBER IF YOU HAVE PROBLEMS THE MORNING OF SURGERY [phone]  REMEMBER:ONLY 1 PER PERSON MAY GO TO SHORT STAY WITH YOU TO GET READY THE MORNING OF YOUR SURGERY  DO NOT EAT FOOD OR DRINK LIQUIDS AFTER MIDNIGHT  TAKE THESE MEDICINES THE MORNING OF SURGERY:  YOU MAY NOT HAVE ANY METAL ON YOUR BODY INCLUDING HAIR PINS AND PIERCING'S. DO NOT WEAR JEWELRY, MAKEUP, LOTIONS, POWDERS OR PERFUMES. DO NOT WEAR NAIL POLISH. DO NOT SHAVE 48 HRS PRIOR TO SURGERY. MEN MAY SHAVE FACE AND NECK.  DO NOT [place]. [place] IS NOT RESPONSIBLE FOR VALUABLES.  CONTACTS, DENTURES OR PARTIALS MAY NOT BE WORN TO SURGERY. LEAVE SUITCASE IN CAR. CAN BE BROUGHT TO ROOM AFTER SURGERY.  PATIENTS DISCHARGED THE DAY OF SURGERY WILL NOT BE ALLOWED TO DRIVE HOME.  PLEASE READ OVER THE FOLLOWING INSTRUCTION SHEETS _________________________________________________________________________________                                          [place] - PREPARING FOR SURGERY  Before surgery, you can play an important role.  Because skin is not sterile, your skin needs to be as free of germs as possible.  You can reduce the number of germs on your skin by washing with CHG (chlorahexidine gluconate) soap before surgery.  CHG is an antiseptic cleaner which kills germs and bonds with the skin to continue killing germs even after washing. Please DO NOT use if you have an allergy to CHG or antibacterial soaps.  If your skin becomes reddened/irritated stop using the CHG and inform your nurse when you arrive at Short Stay. Do not shave (including legs and underarms) for at least 48 hours prior to the first CHG shower.  You may shave your face. Please follow these instructions  carefully:   1.  Shower with CHG Soap the night before surgery and the  morning of Surgery.   2.  If you choose to wash your hair, wash your hair first as usual with your  normal  Shampoo.   3.  After you shampoo, rinse your hair and body thoroughly to remove the  shampoo.                                         4.  Use CHG as you would any other liquid soap.  You can apply chg directly  to the skin and wash . Gently wash with scrungie or clean wascloth    5.  Apply the CHG Soap to your body ONLY FROM THE NECK DOWN.   Do not use on open                           Wound or open sores. Avoid contact with eyes, ears mouth and genitals (private parts).                        Genitals (private parts) with your normal soap.  6.  Wash thoroughly, paying special attention to the area where your surgery  will be performed.   7.  Thoroughly rinse your body with warm water from the neck down.   8.  DO NOT shower/wash with your normal soap after using and rinsing off  the CHG Soap .                9.  Pat yourself dry with a clean towel.             10.  Wear clean night clothes to bed after shower             11.  Place clean sheets on your bed the night of your first shower and do not  sleep with pets.  Day of Surgery : Do not apply any lotions/deodorants the morning of surgery.  Please wear clean clothes to the hospital/surgery center.  FAILURE TO FOLLOW THESE INSTRUCTIONS MAY RESULT IN THE CANCELLATION OF YOUR SURGERY    PATIENT SIGNATURE_________________________________  ______________________________________________________________________

## 2015-06-17 DIAGNOSIS — R350 Frequency of micturition: Secondary | ICD-10-CM | POA: Diagnosis not present

## 2015-06-17 DIAGNOSIS — Z0000 Encounter for general adult medical examination without abnormal findings: Secondary | ICD-10-CM | POA: Diagnosis not present

## 2015-06-17 DIAGNOSIS — N401 Enlarged prostate with lower urinary tract symptoms: Secondary | ICD-10-CM | POA: Diagnosis not present

## 2015-06-17 DIAGNOSIS — R351 Nocturia: Secondary | ICD-10-CM | POA: Diagnosis not present

## 2015-06-17 NOTE — Patient Instructions (Addendum)
YOUR PROCEDURE IS SCHEDULED ON :  06/23/15  REPORT TO [facility] MAIN ENTRANCE FOLLOW SIGNS TO EAST ELEVATOR - GO TO 3rd FLOOR CHECK IN AT 3 EAST NURSES STATION (SHORT STAY) AT:  10:00 AM  CALL THIS NUMBER IF YOU HAVE PROBLEMS THE MORNING OF SURGERY [phone]  REMEMBER:ONLY 1 PER PERSON MAY GO TO SHORT STAY WITH YOU TO GET READY THE MORNING OF YOUR SURGERY  DO NOT EAT FOOD OR DRINK LIQUIDS AFTER MIDNIGHT  TAKE THESE MEDICINES THE MORNING OF SURGERY: GABAPENTIN / Duanne Moron / FLOMAX / LEVOTHYROXINE / PRAVACHOL / NEXIUM  YOU MAY NOT HAVE ANY METAL ON YOUR BODY INCLUDING HAIR PINS AND PIERCING'S. DO NOT WEAR JEWELRY, MAKEUP, LOTIONS, POWDERS OR PERFUMES. DO NOT WEAR NAIL POLISH. DO NOT SHAVE 48 HRS PRIOR TO SURGERY. MEN MAY SHAVE FACE AND NECK.  DO NOT [place]. [facility] IS NOT RESPONSIBLE FOR VALUABLES.  CONTACTS, DENTURES OR PARTIALS MAY NOT BE WORN TO SURGERY. LEAVE SUITCASE IN CAR. CAN BE BROUGHT TO ROOM AFTER SURGERY.  PATIENTS DISCHARGED THE DAY OF SURGERY WILL NOT BE ALLOWED TO DRIVE HOME.  PLEASE READ OVER THE FOLLOWING INSTRUCTION SHEETS _________________________________________________________________________________                                          [place] - PREPARING FOR SURGERY  Before surgery, you can play an important role.  Because skin is not sterile, your skin needs to be as free of germs as possible.  You can reduce the number of germs on your skin by washing with CHG (chlorahexidine gluconate) soap before surgery.  CHG is an antiseptic cleaner which kills germs and bonds with the skin to continue killing germs even after washing. Please DO NOT use if you have an allergy to CHG or antibacterial soaps.  If your skin becomes reddened/irritated stop using the CHG and inform your nurse when you arrive at Short Stay. Do not shave (including legs and underarms) for at least 48 hours prior to the first CHG shower.  You may  shave your face. Please follow these instructions carefully:   1.  Shower with CHG Soap the night before surgery and the  morning of Surgery.   2.  If you choose to wash your hair, wash your hair first as usual with your  normal  Shampoo.   3.  After you shampoo, rinse your hair and body thoroughly to remove the  shampoo.                                         4.  Use CHG as you would any other liquid soap.  You can apply chg directly  to the skin and wash . Gently wash with scrungie or clean wascloth    5.  Apply the CHG Soap to your body ONLY FROM THE NECK DOWN.   Do not use on open                           Wound or open sores. Avoid contact with eyes, ears mouth and genitals (private parts).  Genitals (private parts) with your normal soap.              6.  Wash thoroughly, paying special attention to the area where your surgery  will be performed.   7.  Thoroughly rinse your body with warm water from the neck down.   8.  DO NOT shower/wash with your normal soap after using and rinsing off  the CHG Soap .                9.  Pat yourself dry with a clean towel.             10.  Wear clean night clothes to bed after shower             11.  Place clean sheets on your bed the night of your first shower and do not  sleep with pets.  Day of Surgery : Do not apply any lotions/deodorants the morning of surgery.  Please wear clean clothes to the hospital/surgery center.  FAILURE TO FOLLOW THESE INSTRUCTIONS MAY RESULT IN THE CANCELLATION OF YOUR SURGERY    PATIENT SIGNATURE_________________________________  ______________________________________________________________________    \ \         CLEAR LIQUID DIET   Foods Allowed                                                                     Foods Excluded  Coffee and tea, regular and decaf                             liquids that you cannot  Plain Jell-O in any flavor                                              see through such as: Fruit ices (not with fruit pulp)                                     milk, soups, orange juice  Iced Popsicles                                                     All solid food Carbonated beverages, regular and diet                                    Cranberry, grape and apple juices Sports drinks like Gatorade Lightly seasoned clear broth or consume(fat free) Sugar, honey syrup  _____________________________________________________________________

## 2015-06-18 ENCOUNTER — Encounter (HOSPITAL_COMMUNITY): Payer: Self-pay

## 2015-06-18 ENCOUNTER — Encounter (HOSPITAL_COMMUNITY)
Admission: RE | Admit: 2015-06-18 | Discharge: 2015-06-18 | Disposition: A | Discharge status: Home / Self Care | Payer: Commercial Managed Care - HMO | Source: Ambulatory Visit | Attending: Urology | Admitting: Urology

## 2015-06-18 DIAGNOSIS — Z01810 Encounter for preprocedural cardiovascular examination: Secondary | ICD-10-CM | POA: Insufficient documentation

## 2015-06-18 HISTORY — DX: Personal history of other malignant neoplasm of skin: Z85.828

## 2015-06-18 LAB — BASIC METABOLIC PANEL
ANION GAP: 11 (ref 5–15)
BUN: 17 mg/dL (ref 6–20)
CALCIUM: 9.5 mg/dL (ref 8.9–10.3)
CO2: 27 mmol/L (ref 22–32)
Chloride: 104 mmol/L (ref 101–111)
Creatinine, Ser: 0.94 mg/dL (ref 0.61–1.24)
GFR calc non Af Amer: >60 mL/min (ref >60)
Glucose, Bld: 89 mg/dL (ref 65–99)
Potassium: 4.7 mmol/L (ref 3.5–5.1)
SODIUM: 142 mmol/L (ref 135–145)

## 2015-06-18 LAB — CBC
HEMATOCRIT: 46.2 % (ref 39.0–52.0)
HEMOGLOBIN: 15.9 g/dL (ref 13.0–17.0)
MCH: 30.4 pg (ref 26.0–34.0)
MCHC: 34.4 g/dL (ref 30.0–36.0)
MCV: 88.3 fL (ref 78.0–100.0)
Platelets: 200 10*3/uL (ref 150–400)
RBC: 5.23 MIL/uL (ref 4.22–5.81)
RDW: 13 % (ref 11.5–15.5)
WBC: 7.2 10*3/uL (ref 4.0–10.5)

## 2015-06-22 NOTE — H&P (Signed)
Reason For Visit Cystoscopy, flowrate, PVR and PUS   Active Problems Problems  1. Benign localized prostatic hyperplasia with lower urinary tract symptoms (LUTS) (N40.1)   Assessed By: Carolan Clines (Urology); Last Assessed: 17 Jun 2015 2. Nocturia (R35.1)   Assessed By: Carolan Clines (Urology); Last Assessed: 17 Jun 2015 3. Urinary frequency (R35.0)  History of Present Illness     65 yo male returns today for a cystoscopy, flowrate, PVR and PUS for possible Urolift candidate. He is scheduled for Urolift on 06/23/15.     He is s/p TUIP on 04/01/14. He was seen in the ER 06/02/14 for difficulty voiding for several hours. Foley placed and discharged home with instructions to resume daily Tamsulosin. Urine culture: no growth. He had a successful voiding trial on 06/07/14. Current IPSS=15     Presented 04/05/14 with urgency, frequency & main concern was nocturia every 60mins, but this is not an every night issue, just occasional.     Hx of BPH (taking Tamsulosin) & kidney stones. 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 Denies hematuria or dysuria. Does admit to 3 cups of coffee/day, 1 soda/day, 1 glass of wine occasionally, and 1 bottle of water/day. His IPSS has increased to 16/7.    Note :fibromyalgia, taking gabapentin 300mg /day.        anxiety , Rx xanax       Hypothyroid, thyroxin 25 ug       GERD, Rx Nexium, 40mg /day        ASA 81mg /day ( hx ASVD)                 01/25/14 PSA - 0.54  08/14/12 PSA - 0.50  12/16/10 PSA - 0.57  10/31/09 PSA - 0.52   Past Medical History Problems  1. History of Acute Myocardial Infarction 2. History of Anxiety (F41.9) 3. History of Arthritis 4. History of heartburn [id number])  Surgical History Problems  1. History of Heart Surgery 2. History of Transurethral Resection Of Bladder Neck  Current Meds 1. ALPRAZolam 1 MG Oral Tablet;  Therapy: ([id number]) to Recorded 2. Aspirin 81 MG TABS;  Therapy: (Recorded:06Jan2016) to Recorded 3. Dexilant 60 MG Oral Capsule Delayed Release;  Therapy: ([id number]) to Recorded 4. Omeprazole 40 MG Oral Capsule Delayed Release;  Therapy: ([id number]) to Recorded 5. Tamsulosin HCl - 0.4 MG Oral Capsule; Take 1 capsule by mouth once daily;  Therapy: [id number] to (Evaluate:16May2017)  Requested for: [id number]; Last  Rx:16Jan2017 Ordered 6. Tamsulosin HCl - 0.4 MG Oral Capsule; TAKE 1 CAPSULE Daily;  Therapy: (Recorded:06Jan2016) to Recorded  Allergies Medication  1. Codeine Derivatives 2. Rapaflo CAPS  Family History Problems  1. Family history of Brain Tumor 2. Family history of Family Health Status - Father's Age : Mother   59 3. Family history of Family Health Status Number Of Children   1 son / 1 daugh 4. Family history of Mother Deceased At Age ___   24 Brain tumor  Social History Problems  1. Alcohol Use (History)   1 glass of wine occassionally 2. Caffeine Use   coffee/=3 per day 1 soda/day 3. Former smoker [phone] 4. Tobacco Use   1 pk for 30 yrs, quit 8 yrs ago  Review of Systems Genitourinary, constitutional, skin, eye, otolaryngeal, hematologic/lymphatic, cardiovascular, pulmonary, endocrine, musculoskeletal, gastrointestinal, neurological and psychiatric system(s) were reviewed and pertinent findings if present are noted and are otherwise negative.    Vitals Vital Signs [Data Includes: Last 1 Day]  Recorded: [id number] 01:15PM  Blood Pressure:  145 / 81 Temperature: 97.7 F Heart Rate: 72  Physical Exam Constitutional: Well nourished and well developed . No acute distress.  ENT:. The ears and nose are normal in appearance.  Neck: The appearance of the neck is normal and no neck mass is present.  Pulmonary: No respiratory distress and normal respiratory rhythm and effort.  Cardiovascular: Heart rate and rhythm are normal . No peripheral edema.  Abdomen: The abdomen is soft and nontender.  No masses are palpated. No CVA tenderness. No hernias are palpable. No hepatosplenomegaly noted.  Rectal: Rectal exam demonstrates normal sphincter tone, no tenderness and no masses. Estimated prostate size is 4+. Normal rectal tone, no rectal masses, prostate is smooth, symmetric and non-tender. The prostate has no nodularity and is not tender. The left seminal vesicle is nonpalpable. The right seminal vesicle is nonpalpable. The perineum is normal on inspection.  Genitourinary: Examination of the penis demonstrates no discharge, no masses, no lesions and a normal meatus. The scrotum is without lesions. The right epididymis is palpably normal and non-tender. The left epididymis is palpably normal and non-tender. The right testis is non-tender and without masses. The left testis is non-tender and without masses.  Lymphatics: The femoral and inguinal nodes are not enlarged or tender.  Skin: Normal skin turgor, no visible rash and no visible skin lesions.  Neuro/Psych:. Mood and affect are appropriate.    Results/Data Urine [Data Includes: Last 1 Day]   [id number]  COLOR YELLOW   APPEARANCE CLEAR   SPECIFIC GRAVITY 1.025   pH 6.0   GLUCOSE NEGATIVE   BILIRUBIN NEGATIVE   KETONE NEGATIVE   BLOOD NEGATIVE   PROTEIN NEGATIVE   NITRITE NEGATIVE   LEUKOCYTE ESTERASE NEGATIVE    Flow Rate: Voided 417 ml. A peak flow rate of 40ml/s and mean flow rate of 44ml/s.  PVR: Ultrasound PVR 0cc after cystoscopy. ml. Selected Results  UA With REFLEX [id number] 12:07PM Carolan Clines  SPECIMEN TYPE: CLEAN CATCH   Test Name Result Flag Reference  COLOR YELLOW  YELLOW  ** PLEASE NOTE CHANGE IN UNIT OF MEASURE AND REFERENCE RANGE(S). **  APPEARANCE CLEAR  CLEAR  SPECIFIC GRAVITY 1.025  1.001-1.035  pH 6.0  5.0-8.0  GLUCOSE NEGATIVE  NEGATIVE  BILIRUBIN NEGATIVE  NEGATIVE  KETONE NEGATIVE  NEGATIVE  BLOOD NEGATIVE  NEGATIVE  PROTEIN NEGATIVE  NEGATIVE  NITRITE NEGATIVE  NEGATIVE  LEUKOCYTE ESTERASE  NEGATIVE  NEGATIVE   Procedure Prostate u/s today: Length - 4.14cm, Height - 3.00cm and Width - 4.98cm. Total volume - 32.33 grams. Initial PVR was 46.89cc.   Procedure: Cystoscopy  Chaperone Present: kim lewis.  Indication: Lower Urinary Tract Symptoms.  Informed Consent: Risks, benefits, and potential adverse events were discussed and informed consent was obtained from the patient.  Prep: The patient was prepped with betadine.  Anesthesia:. Local anesthesia was administered intraurethrally with 2% lidocaine jelly.  Antibiotic prophylaxis: Ciprofloxacin.  Procedure Note:  Urethral meatus:. No abnormalities.  Anterior urethra: No abnormalities.  Prostatic urethra:. The lateral prostatic lobes were enlarged. No intravesical median lobe was visualized.  Bladder: The ureteral orifices were in the normal anatomic position bilaterally. A systematic survey of the bladder demonstrated no bladder tumors or stones. Examination of the bladder demonstrated no clot within the bladder, no trabeculation and no diverticulum no fistula, no erythematous mucosa, no ulcer, no edema and no cellules. The patient tolerated the procedure well.  Complications: None.    Assessment Assessed  1. Benign localized prostatic hyperplasia with lower urinary tract  symptoms (LUTS) (N40.1) 2. Nocturia (R35.1) 3. Urinary frequency (R35.0)  65 yo male with BPH voiding on tamsulosin, post TUIP 2015. He wants to get off medication, and has a prostate measuring 32.33cc, with pvr= 46.89cc, and peak flow of 14cc/sec. He will be a good candidate for Urolift.   Plan Continue with plan for Urolift. He wants voiding trial prior to discharge.   Codeine makes him nauseated.   Signatures Electronically signed by : Carolan Clines, M.D.; Jun 17 2015  2:35PM EST

## 2015-06-23 ENCOUNTER — Encounter (HOSPITAL_COMMUNITY)
Admission: RE | Disposition: A | Discharge status: Home / Self Care | Payer: Self-pay | Source: Ambulatory Visit | Attending: Urology

## 2015-06-23 ENCOUNTER — Ambulatory Visit (HOSPITAL_COMMUNITY): Payer: Commercial Managed Care - HMO | Admitting: Registered Nurse

## 2015-06-23 ENCOUNTER — Encounter (HOSPITAL_COMMUNITY): Payer: Self-pay | Admitting: *Deleted

## 2015-06-23 ENCOUNTER — Ambulatory Visit (HOSPITAL_COMMUNITY)
Admission: RE | Admit: 2015-06-23 | Discharge: 2015-06-23 | Disposition: A | Discharge status: Home / Self Care | Payer: Commercial Managed Care - HMO | Source: Ambulatory Visit | Attending: Urology | Admitting: Urology

## 2015-06-23 DIAGNOSIS — Z87891 Personal history of nicotine dependence: Secondary | ICD-10-CM | POA: Insufficient documentation

## 2015-06-23 DIAGNOSIS — F419 Anxiety disorder, unspecified: Secondary | ICD-10-CM | POA: Diagnosis not present

## 2015-06-23 DIAGNOSIS — R351 Nocturia: Secondary | ICD-10-CM | POA: Insufficient documentation

## 2015-06-23 DIAGNOSIS — K219 Gastro-esophageal reflux disease without esophagitis: Secondary | ICD-10-CM | POA: Diagnosis not present

## 2015-06-23 DIAGNOSIS — I2510 Atherosclerotic heart disease of native coronary artery without angina pectoris: Secondary | ICD-10-CM | POA: Diagnosis not present

## 2015-06-23 DIAGNOSIS — Z87442 Personal history of urinary calculi: Secondary | ICD-10-CM | POA: Insufficient documentation

## 2015-06-23 DIAGNOSIS — E039 Hypothyroidism, unspecified: Secondary | ICD-10-CM | POA: Diagnosis not present

## 2015-06-23 DIAGNOSIS — Z7982 Long term (current) use of aspirin: Secondary | ICD-10-CM | POA: Insufficient documentation

## 2015-06-23 DIAGNOSIS — N400 Enlarged prostate without lower urinary tract symptoms: Secondary | ICD-10-CM | POA: Diagnosis not present

## 2015-06-23 DIAGNOSIS — M1990 Unspecified osteoarthritis, unspecified site: Secondary | ICD-10-CM | POA: Insufficient documentation

## 2015-06-23 DIAGNOSIS — N401 Enlarged prostate with lower urinary tract symptoms: Secondary | ICD-10-CM | POA: Insufficient documentation

## 2015-06-23 DIAGNOSIS — I252 Old myocardial infarction: Secondary | ICD-10-CM | POA: Diagnosis not present

## 2015-06-23 DIAGNOSIS — R350 Frequency of micturition: Secondary | ICD-10-CM | POA: Insufficient documentation

## 2015-06-23 DIAGNOSIS — Z79899 Other long term (current) drug therapy: Secondary | ICD-10-CM | POA: Diagnosis not present

## 2015-06-23 DIAGNOSIS — M797 Fibromyalgia: Secondary | ICD-10-CM | POA: Diagnosis not present

## 2015-06-23 HISTORY — PX: CYSTOSCOPY WITH INSERTION OF UROLIFT: SHX6678

## 2015-06-23 SURGERY — CYSTOSCOPY WITH INSERTION OF UROLIFT
Anesthesia: General

## 2015-06-23 MED ORDER — LIDOCAINE HCL (CARDIAC) 20 MG/ML IV SOLN
INTRAVENOUS | Status: DC | PRN
Start: 2015-06-23 — End: 2015-06-23
  Administered 2015-06-23: 100 mg via INTRAVENOUS

## 2015-06-23 MED ORDER — LIDOCAINE HCL (CARDIAC) 20 MG/ML IV SOLN
INTRAVENOUS | Status: AC
Start: 2015-06-23 — End: 2015-06-23
  Filled 2015-06-23: qty 5

## 2015-06-23 MED ORDER — PROPOFOL 10 MG/ML IV BOLUS
INTRAVENOUS | Status: AC
  Filled 2015-06-23: qty 20

## 2015-06-23 MED ORDER — FENTANYL CITRATE (PF) 100 MCG/2ML IJ SOLN: 25.0000 ug | INTRAMUSCULAR | Status: DC | PRN

## 2015-06-23 MED ORDER — GLYCOPYRROLATE 0.2 MG/ML IJ SOLN
INTRAMUSCULAR | Status: DC | PRN
  Administered 2015-06-23: 0.2 mg via INTRAVENOUS

## 2015-06-23 MED ORDER — STERILE WATER FOR IRRIGATION IR SOLN
Status: DC | PRN
  Administered 2015-06-23: 3000 mL

## 2015-06-23 MED ORDER — MIDAZOLAM HCL 5 MG/5ML IJ SOLN
INTRAMUSCULAR | Status: DC | PRN
  Administered 2015-06-23: 2 mg via INTRAVENOUS

## 2015-06-23 MED ORDER — ONDANSETRON HCL 8 MG PO TABS: 8.0000 mg | ORAL_TABLET | Freq: Three times a day (TID) | ORAL | Status: DC | PRN

## 2015-06-23 MED ORDER — KETOROLAC TROMETHAMINE 30 MG/ML IJ SOLN
INTRAMUSCULAR | Status: DC | PRN
  Administered 2015-06-23: 30 mg via INTRAVENOUS

## 2015-06-23 MED ORDER — KETOROLAC TROMETHAMINE 30 MG/ML IJ SOLN
INTRAMUSCULAR | Status: AC
  Filled 2015-06-23: qty 1

## 2015-06-23 MED ORDER — ACETAMINOPHEN 500 MG PO TABS
ORAL_TABLET | ORAL | Status: AC
  Filled 2015-06-23: qty 2

## 2015-06-23 MED ORDER — ONDANSETRON HCL 4 MG/2ML IJ SOLN
INTRAMUSCULAR | Status: AC
  Filled 2015-06-23: qty 2

## 2015-06-23 MED ORDER — CEFAZOLIN SODIUM-DEXTROSE 2-3 GM-% IV SOLR
2.0000 g | INTRAVENOUS | Status: AC
  Administered 2015-06-23: 2 g via INTRAVENOUS

## 2015-06-23 MED ORDER — FENTANYL CITRATE (PF) 100 MCG/2ML IJ SOLN
INTRAMUSCULAR | Status: DC | PRN
  Administered 2015-06-23: 50 ug via INTRAVENOUS

## 2015-06-23 MED ORDER — PROPOFOL 10 MG/ML IV BOLUS
INTRAVENOUS | Status: DC | PRN
  Administered 2015-06-23: 240 mg via INTRAVENOUS

## 2015-06-23 MED ORDER — CEFAZOLIN SODIUM-DEXTROSE 2-3 GM-% IV SOLR
INTRAVENOUS | Status: AC
  Filled 2015-06-23: qty 50

## 2015-06-23 MED ORDER — TRAMADOL-ACETAMINOPHEN 37.5-325 MG PO TABS: 1.0000 | ORAL_TABLET | Freq: Four times a day (QID) | ORAL | Status: DC | PRN

## 2015-06-23 MED ORDER — LACTATED RINGERS IV SOLN
INTRAVENOUS | Status: DC | PRN
  Administered 2015-06-23: 11:00:00 via INTRAVENOUS

## 2015-06-23 MED ORDER — MIDAZOLAM HCL 2 MG/2ML IJ SOLN
INTRAMUSCULAR | Status: AC
  Filled 2015-06-23: qty 2

## 2015-06-23 MED ORDER — FENTANYL CITRATE (PF) 100 MCG/2ML IJ SOLN
INTRAMUSCULAR | Status: AC
  Filled 2015-06-23: qty 2

## 2015-06-23 MED ORDER — TRAMADOL-ACETAMINOPHEN 37.5-325 MG PO TABS
1.0000 | ORAL_TABLET | Freq: Four times a day (QID) | ORAL | Status: DC | PRN
  Administered 2015-06-23: 1 via ORAL
  Filled 2015-06-23: qty 1

## 2015-06-23 MED ORDER — ONDANSETRON HCL 4 MG/2ML IJ SOLN
INTRAMUSCULAR | Status: DC | PRN
  Administered 2015-06-23: 4 mg via INTRAVENOUS

## 2015-06-23 MED ORDER — PROMETHAZINE HCL 25 MG/ML IJ SOLN: 6.2500 mg | INTRAMUSCULAR | Status: DC | PRN

## 2015-06-23 MED ORDER — ACETAMINOPHEN 500 MG PO TABS
1000.0000 mg | ORAL_TABLET | Freq: Four times a day (QID) | ORAL | Status: AC | PRN
  Administered 2015-06-23: 1000 mg via ORAL

## 2015-06-23 SURGICAL SUPPLY — 10 items
BAG URO CATCHER STRL LF (MISCELLANEOUS) ×3 IMPLANT
GLOVE BIOGEL M STRL SZ7.5 (GLOVE) ×3 IMPLANT
GOWN STRL REUS W/ TWL LRG LVL3 (GOWN DISPOSABLE) ×1 IMPLANT
GOWN STRL REUS W/TWL LRG LVL3 (GOWN DISPOSABLE) ×2
GOWN STRL REUS W/TWL XL LVL3 (GOWN DISPOSABLE) ×6 IMPLANT
MANIFOLD NEPTUNE II (INSTRUMENTS) ×3 IMPLANT
PACK CYSTO (CUSTOM PROCEDURE TRAY) ×3 IMPLANT
SYSTEM UROLIFT (Male Continence) ×6 IMPLANT
TUBING CONNECTING 10 (TUBING) ×2 IMPLANT
TUBING CONNECTING 10' (TUBING) ×1

## 2015-06-23 NOTE — Discharge Instructions (Signed)
Benign Prostatic Hyperplasia An enlarged prostate (benign prostatic hyperplasia) is common in older men. You may experience the following:  Weak urine stream.  Dribbling.  Feeling like the bladder has not emptied completely.  Difficulty starting urination.  Getting up frequently at night to urinate.  Urinating more frequently during the day. HOME CARE INSTRUCTIONS  Monitor your prostatic hyperplasia for any changes. The following actions may help to alleviate any discomfort you are experiencing:  Give yourself time when you urinate.  Stay away from alcohol.  Avoid beverages containing caffeine, such as coffee, tea, and colas, because they can make the problem worse.  Avoid decongestants, antihistamines, and some prescription medicines that can make the problem worse.  Follow up with your health care provider for further treatment as recommended. SEEK MEDICAL CARE IF:  You are experiencing progressive difficulty voiding.  Your urine stream is progressively getting narrower.  You are awaking from sleep with the urge to void more frequently.  You are constantly feeling the need to void.  You experience loss of urine, especially in small amounts. SEEK IMMEDIATE MEDICAL CARE IF:   You develop increased pain with urination or are unable to urinate.  You develop severe abdominal pain, vomiting, a high fever, or fainting.  You develop back pain or blood in your urine. MAKE SURE YOU:   Understand these instructions.  Will watch your condition.  Will get help right away if you are not doing well or get worse.   This information is not intended to replace advice given to you by your health care provider. Make sure you discuss any questions you have with your health care provider.   Document Released: 05/17/2005 Document Revised: 06/07/2014 Document Reviewed: 10/17/2012 Elsevier Interactive Patient Education 2016 [employer]  

## 2015-06-23 NOTE — Interval H&P Note (Signed)
History and Physical Interval Note:  06/23/2015 11:12 AM  Marvin Gill  has presented today for surgery, with the diagnosis of BPH  The various methods of treatment have been discussed with the patient and family. After consideration of risks, benefits and other options for treatment, the patient has consented to  Procedure(s): CYSTOSCOPY WITH INSERTION OF UROLIFT (N/A) as a surgical intervention .  The patient's history has been reviewed, patient examined, no change in status, stable for surgery.  I have reviewed the patient's chart and labs.  Questions were answered to the patient's satisfaction.     Govani Radloff I Jamae Tison

## 2015-06-23 NOTE — Anesthesia Procedure Notes (Signed)
Procedure Name: LMA Insertion Date/Time: 06/23/2015 11:33 AM Performed by: Carleene Cooper A Pre-anesthesia Checklist: Patient identified, Emergency Drugs available, Suction available, Patient being monitored and Timeout performed Patient Re-evaluated:Patient Re-evaluated prior to inductionOxygen Delivery Method: Circle system utilized Preoxygenation: Pre-oxygenation with 100% oxygen Intubation Type: IV induction Ventilation: Mask ventilation without difficulty LMA: LMA with gastric port inserted LMA Size: 4.0 Tube type: Oral Number of attempts: 1 Placement Confirmation: positive ETCO2 and breath sounds checked- equal and bilateral Tube secured with: Tape Dental Injury: Teeth and Oropharynx as per pre-operative assessment

## 2015-06-23 NOTE — Transfer of Care (Signed)
Immediate Anesthesia Transfer of Care Note  Patient: Marvin Gill  Procedure(s) Performed: Procedure(s): CYSTOSCOPY WITH INSERTION OF UROLIFT (N/A)  Patient Location: PACU  Anesthesia Type:General  Level of Consciousness: awake, alert , oriented and patient cooperative  Airway & Oxygen Therapy: Patient Spontanous Breathing and Patient connected to face mask oxygen  Post-op Assessment: Report given to RN, Post -op Vital signs reviewed and stable and Patient moving all extremities  Post vital signs: Reviewed and stable  Last Vitals:  Filed Vitals:   06/23/15 0959  BP: 138/92  Pulse: 74  Temp: 36.5 C  Resp: 18    Complications: No apparent anesthesia complications

## 2015-06-23 NOTE — Anesthesia Preprocedure Evaluation (Addendum)
Anesthesia Evaluation  Patient identified by MRN, date of birth, ID band Patient awake    Reviewed: Allergy & Precautions, NPO status , Patient's Chart, lab work & pertinent test results  Airway Mallampati: II  TM Distance: >3 FB Neck ROM: Full    Dental   Pulmonary former smoker,    breath sounds clear to auscultation       Cardiovascular + CAD   Rhythm:Regular Rate:Normal     Neuro/Psych    GI/Hepatic Neg liver ROS, GERD  ,  Endo/Other  Hypothyroidism   Renal/GU      Musculoskeletal  (+) Arthritis ,   Abdominal   Peds  Hematology   Anesthesia Other Findings   Reproductive/Obstetrics                            Anesthesia Physical Anesthesia Plan  ASA: III  Anesthesia Plan: General   Post-op Pain Management:    Induction: Intravenous  Airway Management Planned: LMA  Additional Equipment:   Intra-op Plan:   Post-operative Plan: Extubation in OR  Informed Consent: I have reviewed the patients History and Physical, chart, labs and discussed the procedure including the risks, benefits and alternatives for the proposed anesthesia with the patient or authorized representative who has indicated his/her understanding and acceptance.   Dental advisory given  Plan Discussed with: CRNA and Anesthesiologist  Anesthesia Plan Comments:         Anesthesia Quick Evaluation

## 2015-06-23 NOTE — Op Note (Signed)
Pre-operative diagnosis :   BPH Postoperative diagnosis:  Same  Operation: Cystourethroscopy, implantation of UroLift implants 2  Surgeon:  S. [name], MD  First assistant: None    Anesthesia:  Gen. LMA   Preparation:  After appropriate anesthesia, the patient was brought the operating, placed on the operating table in the dorsal supine position where general LMA anesthesia was introduced. The patient was then replaced in the dorsal lithotomy position with pubis was prepped with Betadine solution and draped in usual fashion. The history was reviewed. The armband was double checked.   Review history:  Benign localized prostatic hyperplasia with lower urinary tract symptoms (LUTS) (N40.1)  Assessed By: Carolan Clines (Urology); Last Assessed: 17 Jun 2015 2. Nocturia (R35.1)  Assessed By: Carolan Clines (Urology); Last Assessed: 17 Jun 2015 3. Urinary frequency (R35.0)  History of Present Illness    65 yo male returns today for a cystoscopy, flowrate, PVR and PUS for possible Urolift candidate. He is scheduled for Urolift on 06/23/15.    He is s/p TUIP on 04/01/14. He was seen in the ER 06/02/14 for difficulty voiding for several hours. Foley placed and discharged home with instructions to resume daily Tamsulosin. Urine culture: no growth. He had a successful voiding trial on 06/07/14. Current IPSS=15     Presented 04/05/14 with urgency, frequency & main concern was nocturia every 83mins, but this is not an every night issue, just occasional.    Hx of BPH (taking Tamsulosin) & kidney stones. 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 Denies hematuria or dysuria. Does admit to 3 cups of coffee/day, 1 soda/day, 1 glass of wine occasionally, and 1 bottle of water/day. His IPSS has increased to 16/7.   Note :fibromyalgia, taking gabapentin 300mg /day.    anxiety , Rx xanax   Hypothyroid, thyroxin 25 ug   GERD, Rx Nexium, 40mg /day    ASA 81mg /day ( hx ASVD)      01/25/14 PSA - 0.54  08/14/12 PSA - 0.50  12/16/10 PSA - 0.57  10/31/09 PSA - 0.52   Past Medical History Problems  1. History of Acute Myocardial Infarction 2. History of Anxiety (F41.9) 3. History of Arthritis 4. History of heartburn [id number])  Surgical History Problems  1. History of Heart Surgery 2. History of Transurethral Resection Of Bladder Neck  Current Meds 1. ALPRAZolam 1 MG Oral Tablet; Therapy: ([id number]) to Recorded 2. Aspirin 81 MG TABS; Therapy: (Recorded:06Jan2016) to Recorded 3. Dexilant 60 MG Oral Capsule Delayed Release; Therapy: ([id number]) to Recorded 4. Omeprazole 40 MG Oral Capsule Delayed Release; Therapy: ([id number]) to Recorded 5. Tamsulosin HCl - 0.4 MG Oral Capsule; Take 1 capsule by mouth once daily; Therapy: [phone] to (Evaluate:16May2017) Requested for: [id number]; Last Rx:16Jan2017 Ordered 6. Tamsulosin HCl - 0.4 MG Oral Capsule; TAKE 1 CAPSULE Daily; Therapy: (Recorded:06Jan2016) to Recorded  Allergies Medication  1. Codeine Derivatives 2. Rapaflo CAPS   Statement of  Likelihood of Success: Excellent. TIME-OUT observed.:  Procedure:  Cystourethroscopy was accomplished showing bilobar BPH. The bladder showed trabeculation, moderate. There was no evidence of bladder stone, tumor, or diverticular formation. Clear reflux is seen from both thoraces.  TheUrolift device was placed within the bladder, and 2 separate Uroliftimplants were placed, 1.5 cm distal to the bladder neck. Cystoscopy showed an open bladder neck, and it was felt that no further implants were necessary. No bleeding was noted. The bladder was drained of fluid, and the patient received IV Toradol, and was awakened and taken to recovery room in good condition.

## 2015-06-23 NOTE — Anesthesia Postprocedure Evaluation (Signed)
Anesthesia Post Note  Patient: Marvin Gill  Procedure(s) Performed: Procedure(s) (LRB): CYSTOSCOPY WITH INSERTION OF UROLIFT (N/A)  Patient location during evaluation: PACU Anesthesia Type: General Level of consciousness: awake Pain management: pain level controlled Respiratory status: spontaneous breathing Cardiovascular status: stable Anesthetic complications: no    Last Vitals:  Filed Vitals:   06/23/15 1241 06/23/15 1249  BP: 133/81 112/77  Pulse: 69 69  Temp:  36.3 C  Resp: 12 12    Last Pain:  Filed Vitals:   06/23/15 1301  PainSc: 6                  EDWARDS,Clevie Prout

## 2015-06-24 ENCOUNTER — Encounter (HOSPITAL_COMMUNITY): Payer: Self-pay | Admitting: Urology

## 2015-06-24 DIAGNOSIS — R338 Other retention of urine: Secondary | ICD-10-CM | POA: Diagnosis not present

## 2015-06-26 DIAGNOSIS — R338 Other retention of urine: Secondary | ICD-10-CM | POA: Diagnosis not present

## 2015-07-25 ENCOUNTER — Other Ambulatory Visit: Payer: Self-pay | Admitting: Internal Medicine

## 2015-07-25 ENCOUNTER — Ambulatory Visit
Admission: RE | Admit: 2015-07-25 | Discharge: 2015-07-25 | Disposition: A | Discharge status: Home / Self Care | Payer: Commercial Managed Care - HMO | Source: Ambulatory Visit | Attending: Internal Medicine | Admitting: Internal Medicine

## 2015-07-25 DIAGNOSIS — N400 Enlarged prostate without lower urinary tract symptoms: Secondary | ICD-10-CM | POA: Diagnosis not present

## 2015-07-25 DIAGNOSIS — Z125 Encounter for screening for malignant neoplasm of prostate: Secondary | ICD-10-CM | POA: Diagnosis not present

## 2015-07-25 DIAGNOSIS — M545 Low back pain: Secondary | ICD-10-CM | POA: Diagnosis not present

## 2015-07-25 DIAGNOSIS — Z23 Encounter for immunization: Secondary | ICD-10-CM | POA: Diagnosis not present

## 2015-07-25 DIAGNOSIS — M5136 Other intervertebral disc degeneration, lumbar region: Secondary | ICD-10-CM | POA: Diagnosis not present

## 2015-07-25 DIAGNOSIS — K219 Gastro-esophageal reflux disease without esophagitis: Secondary | ICD-10-CM | POA: Diagnosis not present

## 2015-07-25 DIAGNOSIS — E039 Hypothyroidism, unspecified: Secondary | ICD-10-CM | POA: Diagnosis not present

## 2015-07-25 DIAGNOSIS — M5489 Other dorsalgia: Secondary | ICD-10-CM

## 2015-07-25 DIAGNOSIS — Z1389 Encounter for screening for other disorder: Secondary | ICD-10-CM | POA: Diagnosis not present

## 2015-07-25 DIAGNOSIS — E7800 Pure hypercholesterolemia, unspecified: Secondary | ICD-10-CM | POA: Diagnosis not present

## 2015-07-25 DIAGNOSIS — Z0000 Encounter for general adult medical examination without abnormal findings: Secondary | ICD-10-CM | POA: Diagnosis not present

## 2015-07-25 DIAGNOSIS — M797 Fibromyalgia: Secondary | ICD-10-CM | POA: Diagnosis not present

## 2015-08-12 DIAGNOSIS — J069 Acute upper respiratory infection, unspecified: Secondary | ICD-10-CM | POA: Diagnosis not present

## 2015-08-12 DIAGNOSIS — B9789 Other viral agents as the cause of diseases classified elsewhere: Secondary | ICD-10-CM | POA: Diagnosis not present

## 2015-08-20 DIAGNOSIS — M545 Low back pain: Secondary | ICD-10-CM | POA: Diagnosis not present

## 2015-08-25 DIAGNOSIS — J069 Acute upper respiratory infection, unspecified: Secondary | ICD-10-CM | POA: Diagnosis not present

## 2015-08-27 DIAGNOSIS — N401 Enlarged prostate with lower urinary tract symptoms: Secondary | ICD-10-CM | POA: Diagnosis not present

## 2015-08-27 DIAGNOSIS — M545 Low back pain: Secondary | ICD-10-CM | POA: Diagnosis not present

## 2015-08-27 DIAGNOSIS — Z0000 Encounter for general adult medical examination without abnormal findings: Secondary | ICD-10-CM | POA: Diagnosis not present

## 2015-08-27 DIAGNOSIS — R350 Frequency of micturition: Secondary | ICD-10-CM | POA: Diagnosis not present

## 2015-10-07 DIAGNOSIS — K219 Gastro-esophageal reflux disease without esophagitis: Secondary | ICD-10-CM | POA: Diagnosis not present

## 2015-10-07 DIAGNOSIS — K591 Functional diarrhea: Secondary | ICD-10-CM | POA: Diagnosis not present

## 2015-10-07 DIAGNOSIS — J069 Acute upper respiratory infection, unspecified: Secondary | ICD-10-CM | POA: Diagnosis not present

## 2015-11-12 DIAGNOSIS — N390 Urinary tract infection, site not specified: Secondary | ICD-10-CM | POA: Diagnosis not present

## 2015-11-12 DIAGNOSIS — R309 Painful micturition, unspecified: Secondary | ICD-10-CM | POA: Diagnosis not present

## 2015-11-13 DIAGNOSIS — R309 Painful micturition, unspecified: Secondary | ICD-10-CM | POA: Diagnosis not present

## 2015-11-15 DIAGNOSIS — M545 Low back pain: Secondary | ICD-10-CM | POA: Diagnosis not present

## 2015-11-19 DIAGNOSIS — M545 Low back pain: Secondary | ICD-10-CM | POA: Diagnosis not present

## 2015-11-27 DIAGNOSIS — M545 Low back pain: Secondary | ICD-10-CM | POA: Diagnosis not present

## 2015-11-30 DIAGNOSIS — R300 Dysuria: Secondary | ICD-10-CM | POA: Diagnosis not present

## 2015-11-30 DIAGNOSIS — M549 Dorsalgia, unspecified: Secondary | ICD-10-CM | POA: Diagnosis not present

## 2015-12-08 DIAGNOSIS — M5430 Sciatica, unspecified side: Secondary | ICD-10-CM | POA: Diagnosis not present

## 2015-12-11 DIAGNOSIS — M5430 Sciatica, unspecified side: Secondary | ICD-10-CM | POA: Diagnosis not present

## 2015-12-17 DIAGNOSIS — N401 Enlarged prostate with lower urinary tract symptoms: Secondary | ICD-10-CM | POA: Diagnosis not present

## 2015-12-17 DIAGNOSIS — M545 Low back pain: Secondary | ICD-10-CM | POA: Diagnosis not present

## 2015-12-17 DIAGNOSIS — R350 Frequency of micturition: Secondary | ICD-10-CM | POA: Diagnosis not present

## 2015-12-18 DIAGNOSIS — M5442 Lumbago with sciatica, left side: Secondary | ICD-10-CM | POA: Diagnosis not present

## 2015-12-18 DIAGNOSIS — M5441 Lumbago with sciatica, right side: Secondary | ICD-10-CM | POA: Diagnosis not present

## 2015-12-18 DIAGNOSIS — G8929 Other chronic pain: Secondary | ICD-10-CM | POA: Diagnosis not present

## 2015-12-23 DIAGNOSIS — M545 Low back pain: Secondary | ICD-10-CM | POA: Diagnosis not present

## 2015-12-29 ENCOUNTER — Other Ambulatory Visit: Payer: Self-pay | Admitting: Internal Medicine

## 2015-12-29 DIAGNOSIS — M545 Low back pain: Secondary | ICD-10-CM

## 2015-12-29 DIAGNOSIS — G8929 Other chronic pain: Secondary | ICD-10-CM | POA: Diagnosis not present

## 2016-01-02 DIAGNOSIS — M5431 Sciatica, right side: Secondary | ICD-10-CM | POA: Diagnosis not present

## 2016-01-04 ENCOUNTER — Ambulatory Visit
Admission: RE | Admit: 2016-01-04 | Discharge: 2016-01-04 | Disposition: A | Discharge status: Home / Self Care | Payer: Commercial Managed Care - HMO | Source: Ambulatory Visit | Attending: Internal Medicine | Admitting: Internal Medicine

## 2016-01-04 DIAGNOSIS — M4806 Spinal stenosis, lumbar region: Secondary | ICD-10-CM | POA: Diagnosis not present

## 2016-01-04 DIAGNOSIS — M545 Low back pain: Secondary | ICD-10-CM

## 2016-01-19 DIAGNOSIS — K219 Gastro-esophageal reflux disease without esophagitis: Secondary | ICD-10-CM | POA: Diagnosis not present

## 2016-01-19 DIAGNOSIS — M4806 Spinal stenosis, lumbar region: Secondary | ICD-10-CM | POA: Diagnosis not present

## 2016-01-19 DIAGNOSIS — R531 Weakness: Secondary | ICD-10-CM | POA: Diagnosis not present

## 2016-01-19 DIAGNOSIS — M797 Fibromyalgia: Secondary | ICD-10-CM | POA: Diagnosis not present

## 2016-01-19 DIAGNOSIS — M5136 Other intervertebral disc degeneration, lumbar region: Secondary | ICD-10-CM | POA: Diagnosis not present

## 2016-02-09 DIAGNOSIS — R509 Fever, unspecified: Secondary | ICD-10-CM | POA: Diagnosis not present

## 2016-02-09 DIAGNOSIS — M791 Myalgia: Secondary | ICD-10-CM | POA: Diagnosis not present

## 2016-02-10 ENCOUNTER — Other Ambulatory Visit: Payer: Self-pay | Admitting: Internal Medicine

## 2016-02-10 ENCOUNTER — Ambulatory Visit
Admission: RE | Admit: 2016-02-10 | Discharge: 2016-02-10 | Disposition: A | Discharge status: Home / Self Care | Payer: Commercial Managed Care - HMO | Source: Ambulatory Visit | Attending: Internal Medicine | Admitting: Internal Medicine

## 2016-02-10 DIAGNOSIS — R509 Fever, unspecified: Secondary | ICD-10-CM

## 2016-02-10 DIAGNOSIS — R05 Cough: Secondary | ICD-10-CM | POA: Diagnosis not present

## 2016-03-01 DIAGNOSIS — K219 Gastro-esophageal reflux disease without esophagitis: Secondary | ICD-10-CM | POA: Diagnosis not present

## 2016-03-01 DIAGNOSIS — I700 Atherosclerosis of aorta: Secondary | ICD-10-CM | POA: Diagnosis not present

## 2016-03-01 DIAGNOSIS — F419 Anxiety disorder, unspecified: Secondary | ICD-10-CM | POA: Diagnosis not present

## 2016-03-01 DIAGNOSIS — Z23 Encounter for immunization: Secondary | ICD-10-CM | POA: Diagnosis not present

## 2016-03-01 DIAGNOSIS — M797 Fibromyalgia: Secondary | ICD-10-CM | POA: Diagnosis not present

## 2016-03-01 DIAGNOSIS — R509 Fever, unspecified: Secondary | ICD-10-CM | POA: Diagnosis not present

## 2016-03-01 DIAGNOSIS — M5136 Other intervertebral disc degeneration, lumbar region: Secondary | ICD-10-CM | POA: Diagnosis not present

## 2016-03-02 DIAGNOSIS — N400 Enlarged prostate without lower urinary tract symptoms: Secondary | ICD-10-CM | POA: Diagnosis not present

## 2016-03-02 DIAGNOSIS — M4807 Spinal stenosis, lumbosacral region: Secondary | ICD-10-CM | POA: Diagnosis not present

## 2016-03-18 DIAGNOSIS — M4807 Spinal stenosis, lumbosacral region: Secondary | ICD-10-CM | POA: Diagnosis not present

## 2016-03-22 DIAGNOSIS — M4807 Spinal stenosis, lumbosacral region: Secondary | ICD-10-CM | POA: Diagnosis not present

## 2016-03-24 DIAGNOSIS — M4807 Spinal stenosis, lumbosacral region: Secondary | ICD-10-CM | POA: Diagnosis not present

## 2016-03-29 DIAGNOSIS — M4807 Spinal stenosis, lumbosacral region: Secondary | ICD-10-CM | POA: Diagnosis not present

## 2016-04-01 DIAGNOSIS — M4807 Spinal stenosis, lumbosacral region: Secondary | ICD-10-CM | POA: Diagnosis not present

## 2016-04-06 DIAGNOSIS — M4807 Spinal stenosis, lumbosacral region: Secondary | ICD-10-CM | POA: Diagnosis not present

## 2016-05-04 DIAGNOSIS — Z23 Encounter for immunization: Secondary | ICD-10-CM | POA: Diagnosis not present

## 2016-05-04 DIAGNOSIS — M79605 Pain in left leg: Secondary | ICD-10-CM | POA: Diagnosis not present

## 2016-05-11 DIAGNOSIS — M4807 Spinal stenosis, lumbosacral region: Secondary | ICD-10-CM | POA: Diagnosis not present

## 2016-05-11 DIAGNOSIS — M48062 Spinal stenosis, lumbar region with neurogenic claudication: Secondary | ICD-10-CM | POA: Diagnosis not present

## 2016-06-09 ENCOUNTER — Other Ambulatory Visit: Payer: Self-pay | Admitting: Dermatology

## 2016-06-09 DIAGNOSIS — L570 Actinic keratosis: Secondary | ICD-10-CM | POA: Diagnosis not present

## 2016-06-09 DIAGNOSIS — D225 Melanocytic nevi of trunk: Secondary | ICD-10-CM | POA: Diagnosis not present

## 2016-06-09 DIAGNOSIS — D044 Carcinoma in situ of skin of scalp and neck: Secondary | ICD-10-CM | POA: Diagnosis not present

## 2016-06-09 DIAGNOSIS — L821 Other seborrheic keratosis: Secondary | ICD-10-CM | POA: Diagnosis not present

## 2016-06-09 DIAGNOSIS — C4442 Squamous cell carcinoma of skin of scalp and neck: Secondary | ICD-10-CM | POA: Diagnosis not present

## 2016-06-09 DIAGNOSIS — L820 Inflamed seborrheic keratosis: Secondary | ICD-10-CM | POA: Diagnosis not present

## 2016-06-15 DIAGNOSIS — M48062 Spinal stenosis, lumbar region with neurogenic claudication: Secondary | ICD-10-CM | POA: Diagnosis not present

## 2016-06-28 DIAGNOSIS — H9201 Otalgia, right ear: Secondary | ICD-10-CM | POA: Diagnosis not present

## 2016-06-28 DIAGNOSIS — H6123 Impacted cerumen, bilateral: Secondary | ICD-10-CM | POA: Diagnosis not present

## 2016-06-29 DIAGNOSIS — M4807 Spinal stenosis, lumbosacral region: Secondary | ICD-10-CM | POA: Diagnosis not present

## 2016-06-29 DIAGNOSIS — M4696 Unspecified inflammatory spondylopathy, lumbar region: Secondary | ICD-10-CM | POA: Diagnosis not present

## 2016-07-14 DIAGNOSIS — R300 Dysuria: Secondary | ICD-10-CM | POA: Diagnosis not present

## 2016-07-14 DIAGNOSIS — M47816 Spondylosis without myelopathy or radiculopathy, lumbar region: Secondary | ICD-10-CM | POA: Diagnosis not present

## 2016-07-23 DIAGNOSIS — M5136 Other intervertebral disc degeneration, lumbar region: Secondary | ICD-10-CM | POA: Diagnosis not present

## 2016-07-26 ENCOUNTER — Other Ambulatory Visit: Payer: Self-pay | Admitting: Internal Medicine

## 2016-07-26 DIAGNOSIS — Z0000 Encounter for general adult medical examination without abnormal findings: Secondary | ICD-10-CM | POA: Diagnosis not present

## 2016-07-26 DIAGNOSIS — I714 Abdominal aortic aneurysm, without rupture, unspecified: Secondary | ICD-10-CM

## 2016-07-26 DIAGNOSIS — M545 Low back pain: Secondary | ICD-10-CM | POA: Diagnosis not present

## 2016-07-26 DIAGNOSIS — I2510 Atherosclerotic heart disease of native coronary artery without angina pectoris: Secondary | ICD-10-CM | POA: Diagnosis not present

## 2016-07-26 DIAGNOSIS — E039 Hypothyroidism, unspecified: Secondary | ICD-10-CM | POA: Diagnosis not present

## 2016-07-26 DIAGNOSIS — E7800 Pure hypercholesterolemia, unspecified: Secondary | ICD-10-CM | POA: Diagnosis not present

## 2016-07-26 DIAGNOSIS — K219 Gastro-esophageal reflux disease without esophagitis: Secondary | ICD-10-CM | POA: Diagnosis not present

## 2016-07-26 DIAGNOSIS — M797 Fibromyalgia: Secondary | ICD-10-CM | POA: Diagnosis not present

## 2016-07-26 DIAGNOSIS — N400 Enlarged prostate without lower urinary tract symptoms: Secondary | ICD-10-CM | POA: Diagnosis not present

## 2016-07-30 ENCOUNTER — Other Ambulatory Visit: Payer: Commercial Managed Care - HMO

## 2016-08-04 ENCOUNTER — Ambulatory Visit
Admission: RE | Admit: 2016-08-04 | Discharge: 2016-08-04 | Disposition: A | Discharge status: Home / Self Care | Payer: Medicare HMO | Source: Ambulatory Visit | Attending: Internal Medicine | Admitting: Internal Medicine

## 2016-08-04 DIAGNOSIS — I714 Abdominal aortic aneurysm, without rupture, unspecified: Secondary | ICD-10-CM

## 2016-08-04 HISTORY — DX: Abdominal aortic aneurysm, without rupture, unspecified: I71.40

## 2016-08-04 HISTORY — DX: Abdominal aortic aneurysm, without rupture: I71.4

## 2016-08-09 DIAGNOSIS — M549 Dorsalgia, unspecified: Secondary | ICD-10-CM | POA: Diagnosis not present

## 2016-08-19 DIAGNOSIS — R8290 Unspecified abnormal findings in urine: Secondary | ICD-10-CM | POA: Diagnosis not present

## 2016-08-19 DIAGNOSIS — K591 Functional diarrhea: Secondary | ICD-10-CM | POA: Diagnosis not present

## 2016-08-19 DIAGNOSIS — N4889 Other specified disorders of penis: Secondary | ICD-10-CM | POA: Diagnosis not present

## 2016-09-15 DIAGNOSIS — M4807 Spinal stenosis, lumbosacral region: Secondary | ICD-10-CM | POA: Diagnosis not present

## 2016-09-22 DIAGNOSIS — L03019 Cellulitis of unspecified finger: Secondary | ICD-10-CM | POA: Diagnosis not present

## 2016-09-30 DIAGNOSIS — M4807 Spinal stenosis, lumbosacral region: Secondary | ICD-10-CM | POA: Diagnosis not present

## 2016-09-30 DIAGNOSIS — M48062 Spinal stenosis, lumbar region with neurogenic claudication: Secondary | ICD-10-CM | POA: Diagnosis not present

## 2016-11-17 DIAGNOSIS — M48062 Spinal stenosis, lumbar region with neurogenic claudication: Secondary | ICD-10-CM | POA: Diagnosis not present

## 2016-11-22 ENCOUNTER — Other Ambulatory Visit: Payer: Self-pay | Admitting: Dermatology

## 2016-11-22 DIAGNOSIS — C44719 Basal cell carcinoma of skin of left lower limb, including hip: Secondary | ICD-10-CM | POA: Diagnosis not present

## 2016-11-22 DIAGNOSIS — Z85828 Personal history of other malignant neoplasm of skin: Secondary | ICD-10-CM | POA: Diagnosis not present

## 2016-11-22 DIAGNOSIS — L905 Scar conditions and fibrosis of skin: Secondary | ICD-10-CM | POA: Diagnosis not present

## 2016-11-22 DIAGNOSIS — L570 Actinic keratosis: Secondary | ICD-10-CM | POA: Diagnosis not present

## 2016-11-22 DIAGNOSIS — L910 Hypertrophic scar: Secondary | ICD-10-CM | POA: Diagnosis not present

## 2016-11-23 DIAGNOSIS — N411 Chronic prostatitis: Secondary | ICD-10-CM | POA: Diagnosis not present

## 2016-11-23 DIAGNOSIS — R3129 Other microscopic hematuria: Secondary | ICD-10-CM | POA: Diagnosis not present

## 2016-11-23 DIAGNOSIS — R300 Dysuria: Secondary | ICD-10-CM | POA: Diagnosis not present

## 2016-11-23 DIAGNOSIS — R42 Dizziness and giddiness: Secondary | ICD-10-CM | POA: Diagnosis not present

## 2016-11-23 DIAGNOSIS — R351 Nocturia: Secondary | ICD-10-CM | POA: Diagnosis not present

## 2016-11-23 DIAGNOSIS — N401 Enlarged prostate with lower urinary tract symptoms: Secondary | ICD-10-CM | POA: Diagnosis not present

## 2016-12-02 DIAGNOSIS — R3129 Other microscopic hematuria: Secondary | ICD-10-CM | POA: Diagnosis not present

## 2016-12-09 DIAGNOSIS — N401 Enlarged prostate with lower urinary tract symptoms: Secondary | ICD-10-CM | POA: Diagnosis not present

## 2016-12-09 DIAGNOSIS — N201 Calculus of ureter: Secondary | ICD-10-CM | POA: Diagnosis not present

## 2016-12-09 DIAGNOSIS — R350 Frequency of micturition: Secondary | ICD-10-CM | POA: Diagnosis not present

## 2016-12-09 DIAGNOSIS — R300 Dysuria: Secondary | ICD-10-CM | POA: Diagnosis not present

## 2016-12-13 DIAGNOSIS — N202 Calculus of kidney with calculus of ureter: Secondary | ICD-10-CM | POA: Diagnosis not present

## 2016-12-14 ENCOUNTER — Other Ambulatory Visit: Payer: Self-pay | Admitting: Urology

## 2016-12-16 ENCOUNTER — Encounter (HOSPITAL_BASED_OUTPATIENT_CLINIC_OR_DEPARTMENT_OTHER): Payer: Self-pay | Admitting: *Deleted

## 2016-12-16 NOTE — Progress Notes (Signed)
To [facility] at 1000,Hg,Ekg on arrival. Npo after Mn-will take nexium,levothyroxine,gabapentin in am with water.

## 2016-12-29 ENCOUNTER — Encounter (HOSPITAL_BASED_OUTPATIENT_CLINIC_OR_DEPARTMENT_OTHER): Payer: Self-pay | Admitting: *Deleted

## 2016-12-29 ENCOUNTER — Ambulatory Visit (HOSPITAL_BASED_OUTPATIENT_CLINIC_OR_DEPARTMENT_OTHER): Payer: Medicare HMO | Admitting: Anesthesiology

## 2016-12-29 ENCOUNTER — Encounter (HOSPITAL_BASED_OUTPATIENT_CLINIC_OR_DEPARTMENT_OTHER)
Admission: RE | Disposition: A | Discharge status: Home / Self Care | Payer: Self-pay | Source: Ambulatory Visit | Attending: Urology

## 2016-12-29 ENCOUNTER — Ambulatory Visit (HOSPITAL_BASED_OUTPATIENT_CLINIC_OR_DEPARTMENT_OTHER)
Admission: RE | Admit: 2016-12-29 | Discharge: 2016-12-29 | Disposition: A | Discharge status: Home / Self Care | Payer: Medicare HMO | Source: Ambulatory Visit | Attending: Urology | Admitting: Urology

## 2016-12-29 ENCOUNTER — Other Ambulatory Visit: Payer: Self-pay

## 2016-12-29 DIAGNOSIS — Z538 Procedure and treatment not carried out for other reasons: Secondary | ICD-10-CM | POA: Insufficient documentation

## 2016-12-29 DIAGNOSIS — F419 Anxiety disorder, unspecified: Secondary | ICD-10-CM | POA: Insufficient documentation

## 2016-12-29 DIAGNOSIS — Z87891 Personal history of nicotine dependence: Secondary | ICD-10-CM | POA: Diagnosis not present

## 2016-12-29 DIAGNOSIS — F1120 Opioid dependence, uncomplicated: Secondary | ICD-10-CM | POA: Diagnosis not present

## 2016-12-29 DIAGNOSIS — E039 Hypothyroidism, unspecified: Secondary | ICD-10-CM | POA: Diagnosis not present

## 2016-12-29 DIAGNOSIS — I2510 Atherosclerotic heart disease of native coronary artery without angina pectoris: Secondary | ICD-10-CM | POA: Diagnosis not present

## 2016-12-29 DIAGNOSIS — N202 Calculus of kidney with calculus of ureter: Secondary | ICD-10-CM | POA: Diagnosis not present

## 2016-12-29 DIAGNOSIS — M797 Fibromyalgia: Secondary | ICD-10-CM | POA: Insufficient documentation

## 2016-12-29 DIAGNOSIS — R69 Illness, unspecified: Secondary | ICD-10-CM | POA: Diagnosis not present

## 2016-12-29 DIAGNOSIS — M1990 Unspecified osteoarthritis, unspecified site: Secondary | ICD-10-CM | POA: Insufficient documentation

## 2016-12-29 DIAGNOSIS — K219 Gastro-esophageal reflux disease without esophagitis: Secondary | ICD-10-CM | POA: Diagnosis not present

## 2016-12-29 HISTORY — DX: Abdominal aortic aneurysm, without rupture: I71.4

## 2016-12-29 HISTORY — DX: Anxiety disorder, unspecified: F41.9

## 2016-12-29 SURGERY — CYSTOSCOPY/URETEROSCOPY/HOLMIUM LASER/STENT PLACEMENT
Anesthesia: General | Laterality: Bilateral

## 2016-12-29 MED ORDER — CEFAZOLIN SODIUM-DEXTROSE 2-4 GM/100ML-% IV SOLN
2.0000 g | INTRAVENOUS | Status: DC
  Filled 2016-12-29: qty 100

## 2016-12-29 MED ORDER — LACTATED RINGERS IV SOLN
INTRAVENOUS | Status: DC
  Filled 2016-12-29: qty 1000

## 2016-12-29 SURGICAL SUPPLY — 22 items
BAG DRAIN URO-CYSTO SKYTR STRL (DRAIN) IMPLANT
BASKET ZERO TIP NITINOL 2.4FR (BASKET) IMPLANT
CATH URET 5FR 28IN OPEN ENDED (CATHETERS) IMPLANT
CATH URET DUAL LUMEN 6-10FR 50 (CATHETERS) IMPLANT
CLOTH BEACON ORANGE TIMEOUT ST (SAFETY) IMPLANT
FIBER LASER FLEXIVA 365 (UROLOGICAL SUPPLIES) IMPLANT
FIBER LASER TRAC TIP (UROLOGICAL SUPPLIES) IMPLANT
GLOVE BIO SURGEON STRL SZ7.5 (GLOVE) IMPLANT
GOWN STRL REUS W/ TWL XL LVL3 (GOWN DISPOSABLE) IMPLANT
GOWN STRL REUS W/TWL XL LVL3 (GOWN DISPOSABLE)
GUIDEWIRE STR DUAL SENSOR (WIRE) IMPLANT
GUIDEWIRE SUPER STIFF (WIRE) IMPLANT
INFUSOR MANOMETER BAG 3000ML (MISCELLANEOUS) IMPLANT
IV NS IRRIG 3000ML ARTHROMATIC (IV SOLUTION) IMPLANT
KIT RM TURNOVER CYSTO AR (KITS) IMPLANT
MANIFOLD NEPTUNE II (INSTRUMENTS) IMPLANT
NS IRRIG 500ML POUR BTL (IV SOLUTION) IMPLANT
PACK CYSTO (CUSTOM PROCEDURE TRAY) IMPLANT
SCRUB PCMX 4 OZ (MISCELLANEOUS) IMPLANT
SHEATH ACCESS URETERAL 38CM (SHEATH) IMPLANT
SYRINGE IRR TOOMEY STRL 70CC (SYRINGE) IMPLANT
TUBE CONNECTING 12X1/4 (SUCTIONS) IMPLANT

## 2016-12-29 NOTE — Anesthesia Preprocedure Evaluation (Deleted)
Anesthesia Evaluation  Patient identified by MRN, date of birth, ID band Patient awake    Reviewed: Allergy & Precautions, NPO status , Patient's Chart, lab work & pertinent test results  History of Anesthesia Complications Negative for: history of anesthetic complications  Airway Mallampati: III  TM Distance: >3 FB Neck ROM: Full    Dental   Pulmonary former smoker,    breath sounds clear to auscultation       Cardiovascular Exercise Tolerance: Good METS: + CAD   Rhythm:Regular Rate:Normal  ECG: SB, rate 79  Sees cardiologist   Neuro/Psych Anxiety negative neurological ROS     GI/Hepatic Neg liver ROS, GERD  Medicated and Controlled,  Endo/Other  Hypothyroidism   Renal/GU      Musculoskeletal  (+) Arthritis , Fibromyalgia -, narcotic dependent  Abdominal   Peds  Hematology   Anesthesia Other Findings   Reproductive/Obstetrics                            Anesthesia Physical  Anesthesia Plan  ASA: III  Anesthesia Plan: General   Post-op Pain Management:    Induction: Intravenous  PONV Risk Score and Plan: 2 and Ondansetron, Dexamethasone, Propofol infusion and Midazolam  Airway Management Planned: LMA  Additional Equipment:   Intra-op Plan:   Post-operative Plan: Extubation in OR  Informed Consent: I have reviewed the patients History and Physical, chart, labs and discussed the procedure including the risks, benefits and alternatives for the proposed anesthesia with the patient or authorized representative who has indicated his/her understanding and acceptance.   Dental advisory given  Plan Discussed with: CRNA  Anesthesia Plan Comments:         Anesthesia Quick Evaluation

## 2016-12-29 NOTE — Progress Notes (Signed)
Pt had coffee with cream at 0700-Surgeon unable to start at 1300 due to clinic schedule in office.Office will arrange to reschedule

## 2016-12-31 ENCOUNTER — Other Ambulatory Visit: Payer: Self-pay | Admitting: Urology

## 2017-01-24 DIAGNOSIS — W57XXXA Bitten or stung by nonvenomous insect and other nonvenomous arthropods, initial encounter: Secondary | ICD-10-CM | POA: Diagnosis not present

## 2017-01-24 DIAGNOSIS — M549 Dorsalgia, unspecified: Secondary | ICD-10-CM | POA: Diagnosis not present

## 2017-01-24 DIAGNOSIS — F419 Anxiety disorder, unspecified: Secondary | ICD-10-CM | POA: Diagnosis not present

## 2017-01-24 DIAGNOSIS — M797 Fibromyalgia: Secondary | ICD-10-CM | POA: Diagnosis not present

## 2017-01-24 DIAGNOSIS — K219 Gastro-esophageal reflux disease without esophagitis: Secondary | ICD-10-CM | POA: Diagnosis not present

## 2017-01-24 DIAGNOSIS — S80861A Insect bite (nonvenomous), right lower leg, initial encounter: Secondary | ICD-10-CM | POA: Diagnosis not present

## 2017-01-25 ENCOUNTER — Encounter (HOSPITAL_BASED_OUTPATIENT_CLINIC_OR_DEPARTMENT_OTHER): Payer: Self-pay | Admitting: *Deleted

## 2017-01-25 NOTE — Progress Notes (Signed)
Pt denies any changes in health assessment since 12/29/16.  Instructed to be npo pmn x nexium, neurontin, synthroid w sip of water.  Ok to take pain/nausea med prn w sip of water. Stressed no candy, mint or gum or food p mn. To [facility] 9/4 @ 1115. Needs hgb on arrival.  Pt encouraged to call the nursing station for any questions and/or concerns.

## 2017-02-01 ENCOUNTER — Ambulatory Visit (HOSPITAL_BASED_OUTPATIENT_CLINIC_OR_DEPARTMENT_OTHER): Payer: Medicare HMO | Admitting: Anesthesiology

## 2017-02-01 ENCOUNTER — Ambulatory Visit (HOSPITAL_BASED_OUTPATIENT_CLINIC_OR_DEPARTMENT_OTHER)
Admission: RE | Admit: 2017-02-01 | Discharge: 2017-02-01 | Disposition: A | Discharge status: Home / Self Care | Payer: Medicare HMO | Source: Ambulatory Visit | Attending: Urology | Admitting: Urology

## 2017-02-01 ENCOUNTER — Encounter (HOSPITAL_BASED_OUTPATIENT_CLINIC_OR_DEPARTMENT_OTHER): Payer: Self-pay | Admitting: *Deleted

## 2017-02-01 ENCOUNTER — Encounter (HOSPITAL_BASED_OUTPATIENT_CLINIC_OR_DEPARTMENT_OTHER)
Admission: RE | Disposition: A | Discharge status: Home / Self Care | Payer: Self-pay | Source: Ambulatory Visit | Attending: Urology

## 2017-02-01 DIAGNOSIS — K219 Gastro-esophageal reflux disease without esophagitis: Secondary | ICD-10-CM | POA: Diagnosis not present

## 2017-02-01 DIAGNOSIS — E039 Hypothyroidism, unspecified: Secondary | ICD-10-CM | POA: Diagnosis not present

## 2017-02-01 DIAGNOSIS — F419 Anxiety disorder, unspecified: Secondary | ICD-10-CM | POA: Diagnosis not present

## 2017-02-01 DIAGNOSIS — R351 Nocturia: Secondary | ICD-10-CM | POA: Insufficient documentation

## 2017-02-01 DIAGNOSIS — N401 Enlarged prostate with lower urinary tract symptoms: Secondary | ICD-10-CM | POA: Insufficient documentation

## 2017-02-01 DIAGNOSIS — N3080 Other cystitis without hematuria: Secondary | ICD-10-CM | POA: Insufficient documentation

## 2017-02-01 DIAGNOSIS — R338 Other retention of urine: Secondary | ICD-10-CM | POA: Diagnosis not present

## 2017-02-01 DIAGNOSIS — N411 Chronic prostatitis: Secondary | ICD-10-CM | POA: Insufficient documentation

## 2017-02-01 DIAGNOSIS — N200 Calculus of kidney: Secondary | ICD-10-CM

## 2017-02-01 DIAGNOSIS — M797 Fibromyalgia: Secondary | ICD-10-CM | POA: Diagnosis not present

## 2017-02-01 DIAGNOSIS — Z79899 Other long term (current) drug therapy: Secondary | ICD-10-CM | POA: Insufficient documentation

## 2017-02-01 DIAGNOSIS — N202 Calculus of kidney with calculus of ureter: Secondary | ICD-10-CM | POA: Insufficient documentation

## 2017-02-01 DIAGNOSIS — Z87891 Personal history of nicotine dependence: Secondary | ICD-10-CM | POA: Insufficient documentation

## 2017-02-01 DIAGNOSIS — N201 Calculus of ureter: Secondary | ICD-10-CM | POA: Diagnosis not present

## 2017-02-01 DIAGNOSIS — Z87442 Personal history of urinary calculi: Secondary | ICD-10-CM | POA: Insufficient documentation

## 2017-02-01 DIAGNOSIS — Z7982 Long term (current) use of aspirin: Secondary | ICD-10-CM | POA: Diagnosis not present

## 2017-02-01 DIAGNOSIS — D494 Neoplasm of unspecified behavior of bladder: Secondary | ICD-10-CM | POA: Diagnosis not present

## 2017-02-01 DIAGNOSIS — N400 Enlarged prostate without lower urinary tract symptoms: Secondary | ICD-10-CM | POA: Diagnosis not present

## 2017-02-01 HISTORY — PX: CYSTOSCOPY/URETEROSCOPY/HOLMIUM LASER/STENT PLACEMENT: SHX6546

## 2017-02-01 LAB — POCT HEMOGLOBIN-HEMACUE: Hemoglobin: 15.8 g/dL (ref 13.0–17.0)

## 2017-02-01 SURGERY — CYSTOSCOPY/URETEROSCOPY/HOLMIUM LASER/STENT PLACEMENT
Anesthesia: General | Site: Renal | Laterality: Bilateral

## 2017-02-01 MED ORDER — CEFAZOLIN SODIUM-DEXTROSE 2-4 GM/100ML-% IV SOLN
2.0000 g | INTRAVENOUS | Status: AC
  Administered 2017-02-01: 2 g via INTRAVENOUS
  Filled 2017-02-01: qty 100

## 2017-02-01 MED ORDER — LIDOCAINE 2% (20 MG/ML) 5 ML SYRINGE
INTRAMUSCULAR | Status: DC | PRN
  Administered 2017-02-01: 100 mg via INTRAVENOUS

## 2017-02-01 MED ORDER — HYDROMORPHONE HCL 1 MG/ML IJ SOLN
0.2500 mg | INTRAMUSCULAR | Status: DC | PRN
  Administered 2017-02-01 (×2): 0.5 mg via INTRAVENOUS
  Filled 2017-02-01: qty 0.5

## 2017-02-01 MED ORDER — ONDANSETRON HCL 4 MG/2ML IJ SOLN
INTRAMUSCULAR | Status: DC | PRN
  Administered 2017-02-01: 4 mg via INTRAVENOUS

## 2017-02-01 MED ORDER — LIDOCAINE 2% (20 MG/ML) 5 ML SYRINGE
INTRAMUSCULAR | Status: AC
  Filled 2017-02-01: qty 5

## 2017-02-01 MED ORDER — FENTANYL CITRATE (PF) 100 MCG/2ML IJ SOLN
INTRAMUSCULAR | Status: AC
  Filled 2017-02-01: qty 2

## 2017-02-01 MED ORDER — PROPOFOL 10 MG/ML IV BOLUS
INTRAVENOUS | Status: DC | PRN
  Administered 2017-02-01: 160 mg via INTRAVENOUS

## 2017-02-01 MED ORDER — HYDROCODONE-ACETAMINOPHEN 5-325 MG PO TABS
1.0000 | ORAL_TABLET | ORAL | Status: DC | PRN
  Administered 2017-02-01: 1 via ORAL
  Filled 2017-02-01: qty 1

## 2017-02-01 MED ORDER — PROMETHAZINE HCL 25 MG/ML IJ SOLN
6.2500 mg | Freq: Four times a day (QID) | INTRAMUSCULAR | Status: DC | PRN
  Administered 2017-02-01: 6.25 mg via INTRAVENOUS
  Filled 2017-02-01: qty 1

## 2017-02-01 MED ORDER — SODIUM CHLORIDE 0.9 % IR SOLN
Status: DC | PRN
  Administered 2017-02-01: 4000 mL

## 2017-02-01 MED ORDER — CEFAZOLIN SODIUM-DEXTROSE 2-4 GM/100ML-% IV SOLN
INTRAVENOUS | Status: AC
  Filled 2017-02-01: qty 100

## 2017-02-01 MED ORDER — IOHEXOL 300 MG/ML  SOLN
INTRAMUSCULAR | Status: DC | PRN
  Administered 2017-02-01: 20 mL

## 2017-02-01 MED ORDER — MEPERIDINE HCL 25 MG/ML IJ SOLN
6.2500 mg | INTRAMUSCULAR | Status: DC | PRN
  Filled 2017-02-01: qty 1

## 2017-02-01 MED ORDER — CEPHALEXIN 500 MG PO CAPS: 500.0000 mg | ORAL_CAPSULE | Freq: Three times a day (TID) | ORAL | 0 refills | Status: AC

## 2017-02-01 MED ORDER — HYDROCODONE-ACETAMINOPHEN 5-325 MG PO TABS
ORAL_TABLET | ORAL | Status: AC
  Filled 2017-02-01: qty 1

## 2017-02-01 MED ORDER — DEXAMETHASONE SODIUM PHOSPHATE 10 MG/ML IJ SOLN
INTRAMUSCULAR | Status: DC | PRN
  Administered 2017-02-01: 10 mg via INTRAVENOUS

## 2017-02-01 MED ORDER — HYDROMORPHONE HCL-NACL 0.5-0.9 MG/ML-% IV SOSY
PREFILLED_SYRINGE | INTRAVENOUS | Status: AC
  Filled 2017-02-01: qty 1

## 2017-02-01 MED ORDER — PROPOFOL 10 MG/ML IV BOLUS
INTRAVENOUS | Status: AC
  Filled 2017-02-01: qty 20

## 2017-02-01 MED ORDER — ONDANSETRON HCL 4 MG/2ML IJ SOLN
4.0000 mg | Freq: Once | INTRAMUSCULAR | Status: DC | PRN
  Filled 2017-02-01: qty 2

## 2017-02-01 MED ORDER — PROMETHAZINE HCL 25 MG/ML IJ SOLN
INTRAMUSCULAR | Status: AC
  Filled 2017-02-01: qty 1

## 2017-02-01 MED ORDER — PHENAZOPYRIDINE HCL 100 MG PO TABS
ORAL_TABLET | ORAL | Status: AC
  Filled 2017-02-01: qty 2

## 2017-02-01 MED ORDER — PHENAZOPYRIDINE HCL 200 MG PO TABS
200.0000 mg | ORAL_TABLET | Freq: Once | ORAL | Status: AC
  Administered 2017-02-01: 200 mg via ORAL
  Filled 2017-02-01: qty 1

## 2017-02-01 MED ORDER — FENTANYL CITRATE (PF) 100 MCG/2ML IJ SOLN
INTRAMUSCULAR | Status: DC | PRN
  Administered 2017-02-01 (×2): 50 ug via INTRAVENOUS

## 2017-02-01 MED ORDER — HYDROCODONE-ACETAMINOPHEN 5-325 MG PO TABS: 1.0000 | ORAL_TABLET | ORAL | 0 refills | Status: DC | PRN

## 2017-02-01 MED ORDER — DEXAMETHASONE SODIUM PHOSPHATE 10 MG/ML IJ SOLN
INTRAMUSCULAR | Status: AC
  Filled 2017-02-01: qty 1

## 2017-02-01 MED ORDER — ONDANSETRON HCL 4 MG/2ML IJ SOLN
INTRAMUSCULAR | Status: AC
  Filled 2017-02-01: qty 2

## 2017-02-01 MED ORDER — LACTATED RINGERS IV SOLN
INTRAVENOUS | Status: DC
  Administered 2017-02-01 (×2): via INTRAVENOUS
  Filled 2017-02-01: qty 1000

## 2017-02-01 MED FILL — HYDROCODON-APAP 5-325: 5-325 | 3 days supply | Qty: 20 | Fill #0

## 2017-02-01 MED FILL — CEPHALEXIN 500 MG CAPSULE: 500 | 2 days supply | Qty: 6 | Fill #0

## 2017-02-01 SURGICAL SUPPLY — 29 items
BAG DRAIN URO-CYSTO SKYTR STRL (DRAIN) ×2 IMPLANT
BASKET ZERO TIP NITINOL 2.4FR (BASKET) ×2 IMPLANT
CATH URET 5FR 28IN OPEN ENDED (CATHETERS) ×2 IMPLANT
CATH URET DUAL LUMEN 6-10FR 50 (CATHETERS) ×2 IMPLANT
CLOTH BEACON ORANGE TIMEOUT ST (SAFETY) ×2 IMPLANT
ELECT REM PT RETURN 9FT ADLT (ELECTROSURGICAL) ×2
ELECTRODE REM PT RTRN 9FT ADLT (ELECTROSURGICAL) ×1 IMPLANT
FIBER LASER FLEXIVA 365 (UROLOGICAL SUPPLIES) IMPLANT
FIBER LASER TRAC TIP (UROLOGICAL SUPPLIES) ×2 IMPLANT
GLOVE BIO SURGEON STRL SZ7.5 (GLOVE) ×2 IMPLANT
GLOVE BIOGEL PI IND STRL 7.5 (GLOVE) ×2 IMPLANT
GLOVE BIOGEL PI INDICATOR 7.5 (GLOVE) ×2
GOWN STRL REUS W/ TWL XL LVL3 (GOWN DISPOSABLE) IMPLANT
GOWN STRL REUS W/TWL LRG LVL3 (GOWN DISPOSABLE) ×4 IMPLANT
GOWN STRL REUS W/TWL XL LVL3 (GOWN DISPOSABLE)
GUIDEWIRE ANG ZIPWIRE 038X150 (WIRE) ×2 IMPLANT
GUIDEWIRE STR DUAL SENSOR (WIRE) ×4 IMPLANT
GUIDEWIRE SUPER STIFF (WIRE) IMPLANT
INFUSOR MANOMETER BAG 3000ML (MISCELLANEOUS) ×2 IMPLANT
IV NS IRRIG 3000ML ARTHROMATIC (IV SOLUTION) ×2 IMPLANT
KIT RM TURNOVER CYSTO AR (KITS) ×2 IMPLANT
MANIFOLD NEPTUNE II (INSTRUMENTS) ×2 IMPLANT
NS IRRIG 500ML POUR BTL (IV SOLUTION) IMPLANT
PACK CYSTO (CUSTOM PROCEDURE TRAY) ×2 IMPLANT
SCRUB PCMX 4 OZ (MISCELLANEOUS) IMPLANT
SHEATH ACCESS URETERAL 38CM (SHEATH) ×2 IMPLANT
STENT URET 6FRX28 CONTOUR (STENTS) ×4 IMPLANT
SYRINGE IRR TOOMEY STRL 70CC (SYRINGE) IMPLANT
TUBE CONNECTING 12X1/4 (SUCTIONS) ×2 IMPLANT

## 2017-02-01 NOTE — H&P (Signed)
CC: I have kidney stones.  HPI: Marvin Gill is a 66 year-old male established patient who is here for renal calculi.  The problem is on the left side.   Patient with recent dx of left 9 mm proximal ureteral stone and 1 cm right lower pole nonobstructing stone. Patient is currently experiencing dysuria though he had a negative urine culture 2 days ago. He does have a history of chronic prostatitis/dysuria. No flows have a history of a urolith. He does report pain in his left hips both consistent with his arthritis pain. He has no current left flank pain.     ALLERGIES: Codeine Derivatives - Nausea Rapaflo CAPS - Other Reaction, unknown    MEDICATIONS: Doxycycline Hyclate 100 mg capsule 1 capsule PO BID  ALPRAZolam 1 MG Oral Tablet Oral  Aspirin Ec 81 mg tablet, delayed release Oral  Azithromycin 250 mg tablet Oral  Dexilant 60 MG Oral Capsule Delayed Release Oral  Gabapentin 600 mg tablet Oral  Levothyroxine Sodium 50 MCG Oral Tablet Oral  Omeprazole 40 MG Oral Capsule Delayed Release Oral  Pravastatin Sodium 40 MG Oral Tablet Oral  Uribel 118 mg-10 mg-40.8 mg-36 mg-0.12 mg capsule 1 capsule PO TID PRN  Valacyclovir 500 mg tablet Oral     GU PSH: Locm 300-399Mg /Ml Iodine,1Ml - 12/02/2016 Revise Bladder Neck - 2015 UROLIFT - 06/28/2015    NON-GU PSH: Heart Surgery (Unspecified)    GU PMH: Dysuria - 12/09/2016, - 11/23/2016 (Worsening, Chronic), Culture urine. No ABX unless culture proven UTI. Uribel 118 mg 1 po TID PRN. Discussed avoidance of bladder irritants and increasing water intake. , - 07/14/2016 Ureteral calculus, Left - 12/09/2016 Chronic prostatitis - 11/23/2016 BPH w/o LUTS - 03/02/2016 BPH w/LUTS, Benign localized prostatic hyperplasia with lower urinary tract symptoms (LUTS) - 08/27/2015 Urinary Retention, Acute retention of urine - 06/26/2015 Nocturia, Nocturia - 06/17/2015 Urinary Frequency, Urinary frequency - 06/17/2015 Urinary Retention, Unspec, Urinary retention -  2016 Other microscopic hematuria, Microscopic hematuria - 2016 Low back pain, Lower back pain - 2014 Renal calculus, Nephrolithiasis - 2014 Urinary Tract Inf, Unspec site, Urinary tract infection - 2014      PMH Notes:  2012-08-14 15:58:37 - Note: No Orgasmic Sensation Felt During Ejaculation  2008-02-20 10:54:53 - Note: Acute Myocardial Infarction  2008-02-20 10:54:53 - Note: Arthritis   NON-GU PMH: Encounter for general adult medical examination without abnormal findings, Encounter for preventive health examination - 06/17/2015 Anxiety, Anxiety - 2014 Personal history of other specified conditions, History of heartburn - 2014 Fibromyalgia    FAMILY HISTORY: Brain tumor - Runs In Family Family Health Status - Father alive at age 15 - Mother Family Health Status Number - [place] In Family Mother Deceased At Age 71 from diabetic complicati - Runs In Family   SOCIAL HISTORY: Marital Status: Married Current Smoking Status: Patient does not smoke anymore.  Does not use smokeless tobacco. Drinks 1 drink per day.  Drinks 1 caffeinated drink per day.    REVIEW OF SYSTEMS:    GU Review Male:   Patient reports frequent urination, burning/ pain with urination, get up at night to urinate, stream starts and stops, trouble starting your stream, have to strain to urinate , and penile pain. Patient denies hard to postpone urination, leakage of urine, and erection problems.  Gastrointestinal (Upper):   Patient reports nausea and vomiting. Patient denies indigestion/ heartburn.  Gastrointestinal (Lower):   Patient denies diarrhea and constipation.  Constitutional:   Patient reports fatigue. Patient denies fever, night sweats, and  weight loss.  Skin:   Patient denies skin rash/ lesion and itching.  Eyes:   Patient denies blurred vision and double vision.  Ears/ Nose/ Throat:   Patient denies sore throat and sinus problems.  Hematologic/Lymphatic:   Patient denies swollen glands and easy bruising.   Cardiovascular:   Patient denies leg swelling and chest pains.  Respiratory:   Patient denies cough and shortness of breath.  Endocrine:   Patient denies excessive thirst.  Musculoskeletal:   Patient reports back pain and joint pain.   Neurological:   Patient reports dizziness. Patient denies headaches.  Psychologic:   Patient reports depression. Patient denies anxiety.   VITAL SIGNS:      12/13/2016 10:15 AM  Weight 225 lb / 102.06 kg  Height 76 in / 193.04 cm  BP 131/83 mmHg  Pulse 58 /min  Temperature 97.9 F / 37 C  BMI 27.4 kg/m   MULTI-SYSTEM PHYSICAL EXAMINATION:    Constitutional: Well-nourished. No physical deformities. Normally developed. Good grooming.  Neck: Neck symmetrical, not swollen. Normal tracheal position.  Respiratory: No labored breathing, no use of accessory muscles.   Cardiovascular: Normal temperature, normal extremity pulses, no swelling, no varicosities.  Skin: No paleness, no jaundice, no cyanosis. No lesion, no ulcer, no rash.  Gastrointestinal: No mass, no tenderness, no rigidity, non obese abdomen.  Eyes: Normal conjunctivae. Normal eyelids.  Ears, Nose, Mouth, and Throat: Left ear no scars, no lesions, no masses. Right ear no scars, no lesions, no masses. Nose no scars, no lesions, no masses. Normal hearing. Normal lips.  Musculoskeletal: Normal gait and station of head and neck.     PAST DATA REVIEWED:  Source Of History:  Patient  X-Ray Review: C.T. Abdomen/Pelvis: Reviewed Films. Reviewed Report. Discussed With Patient.     01/25/14 08/15/12 12/17/10 10/31/09 02/20/08  PSA  Total PSA 0.54  0.50  0.57  0.52  0.79     PROCEDURES:          Urinalysis w/Scope Dipstick Dipstick Cont'd Micro  Color: Yellow Bilirubin: Neg WBC/hpf: 0 - 5/hpf  Appearance: Clear Ketones: Neg RBC/hpf: 0 - 2/hpf  Specific Gravity: 1.025 Blood: Trace Bacteria: Rare (0-9/hpf)  pH: 6.5 Protein: Neg Cystals: NS (Not Seen)  Glucose: Neg Urobilinogen: 0.2 Casts: NS (Not  Seen)    Nitrites: Neg Trichomonas: Not Present    Leukocyte Esterase: Neg Mucous: Present      Epithelial Cells: 0 - 5/hpf      Yeast: NS (Not Seen)      Sperm: Not Present    ASSESSMENT:      ICD-10 Details  1 GU:   Renal calculus - N20.0   2   Ureteral calculus - N20.1    PLAN:           Schedule Procedure: Unspecified Date - Cysto Uretero Lithotripsy - 52778, bilateral          Document Letter(s):  Created for Patient: Clinical Summary   Created for Delanna Ahmadi, MD    The patient and I talked about surgical treatment for their kidney stones. Etiologies of kidney stones including dehydration, poor fluid intake, intake of well water, congenital renal disease, previous bowel surgery, idiopathic, and others were discussed with the patient.   Metabolic evaluation of kidney stone disease was discussed with the patient. Alternative treatment options including increased water intake, increased lemonade intake, and dietary moderation with calcium and oxalate containing foods were discussed with the patient in detail.   The risks, benefits,  and some of the possible complications of surgical treatments including cystoscopy, ureteroscopy, renoscopy, laser lithotripsy, extracorporeal shock wave lithotripsy, stent insertion and others were discussed with the patient. All questions were answered.  The patient gave fully informed consent to proceed with a ureteroscopy with or without laser lithotripsy with stone extraction for the treatment of their kidney stones.   The patient was given instructions to call for abdominal pain, pelvic pain, perirectal pain, nausea, vomiting, diarrhea, fever over 100 degrees F, chills, hematuria, dysuria, frequency, urgency, or urge incontinence.         Notes:   I discussed treatment options with the patient to include active surveillance, lithotripsy, and ureteroscopy. I did not recommend surveillance at this time due to the large size of his left  ureteral stone. He does understand and benefit of undergoing ureteroscopy with inability to his left ureteral stone in his right nonobstructing stone during the same procedure. For this reason, the patient has elected to proceed with cystoscopy and bilateral ureteroscopy. We will plan to start on the left side since this is where his obstructing stone is. He does understand the risk of needing multiple procedures. We'll further address the patient's chronic prostatitis and dysuria symptoms once the stone burden has been addressed.

## 2017-02-01 NOTE — Anesthesia Procedure Notes (Signed)
Procedure Name: LMA Insertion Date/Time: 02/01/2017 1:18 PM Performed by: Bethena Roys T Pre-anesthesia Checklist: Patient identified, Emergency Drugs available, Suction available and Patient being monitored Patient Re-evaluated:Patient Re-evaluated prior to induction Oxygen Delivery Method: Circle system utilized Preoxygenation: Pre-oxygenation with 100% oxygen Induction Type: IV induction Ventilation: Mask ventilation without difficulty LMA: LMA inserted LMA Size: 5.0 Number of attempts: 1 Airway Equipment and Method: Bite block Placement Confirmation: positive ETCO2 Tube secured with: Tape Dental Injury: Teeth and Oropharynx as per pre-operative assessment

## 2017-02-01 NOTE — Op Note (Signed)
Date of procedure: 02/01/17  Preoperative diagnosis:  1. Left ureteral calculus 2. Right renal calculus   Postoperative diagnosis:  1. Left ureteral calculus 2. Right renal calculus 3. Bladder inflammation versus tumor   Procedure: 1. Cystoscopy 2. Left ureteroscopy 3. Attempted right ureteroscopy 4. Laser lithotripsy 5. Stone basketing 6. Bilateral retrograde pyelogram with interpretation 7. Bilateral ureteral stent placement 6 French by 28 cm 8. Bladder biopsy x4 9. Fulguration of bleeders  Surgeon: Baruch Gouty, MD  Anesthesia: General  Complications: None  Intraoperative findings: the patient's left ureteral stone successfully treated. The right renal collecting system was unable to be accessed safely for management of his right lower pole renal stone. Postoperative the right ureteral stent. There was also inflammation vs tumor around the trigone and left ureteral orifice. It did seem 2. More inflammatory, nonetheless this was biopsied and sent to pathology.  EBL: none  Specimens: left ureteral stone office lab, bladder biopsy 4 to pathology  Drains: bilateral 6 French by 28 cm double-J ureteral stents  Disposition: Stable to the postanesthesia care unit  Indication for procedure: The patient is a 66 y.o. male with a left ureteral stone1 cm in size and a 1 cm right lower pole renal stone presents today for management.  After reviewing the management options for treatment, the patient elected to proceed with the above surgical procedure(s). We have discussed the potential benefits and risks of the procedure, side effects of the proposed treatment, the likelihood of the patient achieving the goals of the procedure, and any potential problems that might occur during the procedure or recuperation. Informed consent has been obtained.  Description of procedure: The patient was met in the preoperative area. All risks, benefits, and indications of the procedure were described  in great detail. The patient consented to the procedure. Preoperative antibiotics were given. The patient was taken to the operative theater. General anesthesia was induced per the anesthesia service. The patient was then placed in the dorsal lithotomy position and prepped and draped in the usual sterile fashion. A preoperative timeout was called.   A 21 French 30 cystoscope was inserted versus bladder per urethra atraumatically. On fluoroscopy, the stone was seen at the left UPJ. 2 sensor wires were advanced past the stone through the left ureteral orifice up to the left renal pelvis under fluoroscopy. A ureteral access sheath was then placed over one of the sensor wires atraumatically under fluoroscopy. A flexible ureteroscope was inserted into the access sheath. The stone was encountered at the left UPJ. After minimal lithotripsy to fall back into the kidney. His relocated to the left upper pole with a stone basket. He was broken into smaller pieces with laser lithotripsy. All fragments greater than 2 mm were removed with a stone basket. Pan nephroscopy revealed no more significant fragments. This was confirmed with a left retrograde pyelogram through the ureteroscope. Through access sheath was then removed under direct visualization revealing no stones or significant trauma. With the cystoscope was 6 [place] by 28 cm double-J ureteral stent was inserted on the left over the remaining sensor wire. Sensor wire was removed. A curl seen in the left renal pelvis on fluoroscopy and the urinary bladder under direct visualization. Attention was then turned to the right ureter orifice where 2 sensor wires were advanced the level of the right renal pelvis under fluoroscopy. Attempt was made to place a reteral access sheath, however was not easily navigated past the distal ureter. The inner portion of access sheath was then used  to try to dilate the distal ureter, however this was unsuccessful.At this point over one of  the sensor wires stent was placeda dual-lumen followed by single-lumen ureteroscope. Neither ureteroscope was able to be advanced past the distal ureter. At this point, further attempts at this were reported due to concern for ureteral trauma. Retrograde pyelogram was obtained in the right side toidentify the collecting system. One of the sensor wires was removed. Over the remaining sensor wire a 6 [place] by 28 cm double-J ureteral stent was placed in identical fashion to the contralateral side. A curl seen in the renal pelvis on the right under fluoroscopy and in the urinary bladder under direct visualization.   At this point attention was then turned to he trigone the left ureter orifice. Again the procedure there was an erythematous and slightly exophytic tumor though it seemed more consistent with inflammatory changes from chronic cystitis.the covered the trigone and wrapped around the left ureter orifice. To ensure that this was not a malignancy was biopsied in 4 separate locations. These were sent to pathology. Hemostasis was obtained with Bugbee electrocautery. At this point, after good hemostasis was obtained the patient's bladder was drained he was woken from anesthesia and transferred in stable condition to the post anesthesia care unit.  Plan: He patient is tentatively scheduled for stent removal next week. For now we will keep this appointment as the main goal of this operation was to remove his left ureteral stone which was successful. If he is interested in undergoing definitive stone management of his right lower pole stone, he will call the office and cancel his appointment. We'll schedule him forrepeat ureteroscopy at that time after passive dilation of his ureter from the right ureteral stent. We'll also need to discuss his pathology results with him when available. In the unlikely event that they come back as malignant, he will need to undergo a formal TURBT at that time.  Baruch Gouty,  M.D.

## 2017-02-01 NOTE — Discharge Instructions (Signed)
[facility] °[phone] °Post Ureteroscopy With or Without Stent Instructions ° °Definitions: ° °Ureter: The duct that transports urine from the kidney to the bladder. °Stent:   A plastic hollow tube that is placed into the ureter, from the kidney to the                 bladder to prevent the ureter from swelling shut. ° °GENERAL INSTRUCTIONS: ° °Despite the fact that no skin incisions were used, the area around the ureter and bladder is raw and irritated. The stent is a foreign body which will further irritate the bladder wall. This irritation is manifested by increased frequency of urination, both day and night, and by an increase in the urge to urinate. In some, the urge to urinate is present almost always. Sometimes the urge is strong enough that you may not be able to stop yourself from urinating. The only real cure is to remove the stent and then give time for the bladder wall to heal which can't be done until the danger of the ureter swelling shut has passed, which varies. ° °You may see some blood in your urine while the stent is in place and a few days afterwards. Do not be alarmed, even if the urine was clear for a while. Get off your feet and drink lots of fluids until clearing occurs. If you start to pass clots or don't improve, call us. ° °DIET: °You may return to your normal diet immediately. Because of the raw surface of your bladder, alcohol, spicy foods, acid type foods and drinks with caffeine may cause irritation or frequency and should be used in moderation. To keep your urine flowing freely and to avoid constipation, drink plenty of fluids during the day ( 8-10 glasses ). °Tip: Avoid cranberry juice because it is very acidic. ° °ACTIVITY: °Your physical activity doesn't need to be restricted. However, if you are very active, you may see some blood in your urine. We suggest that you reduce your activity under these circumstances until the bleeding has stopped. ° °BOWELS: °It is  important to keep your bowels regular during the postoperative period. Straining with bowel movements can cause bleeding. A bowel movement every other day is reasonable. Use a mild laxative if needed, such as Milk of Magnesia 2-3 tablespoons, or 2 Dulcolax tablets. Call if you continue to have problems. If you have been taking narcotics for pain, before, during or after your surgery, you may be constipated. Take a laxative if necessary. ° ° °MEDICATION: °You should resume your pre-surgery medications unless told not to. In addition you will often be given an antibiotic to prevent infection. These should be taken as prescribed until the bottles are finished unless you are having an unusual reaction to one of the drugs. ° °PROBLEMS YOU SHOULD REPORT TO US: °· Fevers over 100.5 Fahrenheit. °· Heavy bleeding, or clots ( See above notes about blood in urine ). °· Inability to urinate. °· Drug reactions ( hives, rash, nausea, vomiting, diarrhea ). °· Severe burning or pain with urination that is not improving. ° °FOLLOW-UP: °You will need a follow-up appointment to monitor your progress. Call for this appointment at the number listed above. Usually the first appointment will be about three to fourteen days after your surgery. ° ° ° ° ° °Post Anesthesia Home Care Instructions ° °Activity: °Get plenty of rest for the remainder of the day. A responsible individual must stay with you for 24 hours following the procedure.  °  For the next 24 hours, DO NOT: °-Drive a car °-Operate machinery °-Drink alcoholic beverages °-Take any medication unless instructed by your physician °-Make any legal decisions or sign important papers. ° °Meals: °Start with liquid foods such as gelatin or soup. Progress to regular foods as tolerated. Avoid greasy, spicy, heavy foods. If nausea and/or vomiting occur, drink only clear liquids until the nausea and/or vomiting subsides. Call your physician if vomiting continues. ° °Special  Instructions/Symptoms: °Your throat may feel dry or sore from the anesthesia or the breathing tube placed in your throat during surgery. If this causes discomfort, gargle with warm salt water. The discomfort should disappear within 24 hours. ° °If you had a scopolamine patch placed behind your ear for the management of post- operative nausea and/or vomiting: ° °1. The medication in the patch is effective for 72 hours, after which it should be removed.  Wrap patch in a tissue and discard in the trash. Wash hands thoroughly with soap and water. °2. You may remove the patch earlier than 72 hours if you experience unpleasant side effects which may include dry mouth, dizziness or visual disturbances. °3. Avoid touching the patch. Wash your hands with soap and water after contact with the patch. °  ° °

## 2017-02-01 NOTE — Anesthesia Postprocedure Evaluation (Signed)
Anesthesia Post Note  Patient: Hameed Kolar Trebilcock  Procedure(s) Performed: Procedure(s) (LRB): CYSTOSCOPY/URETEROSCOPY/HOLMIUM LASER/STENT PLACEMENT,STONE BASKETRY, BLADDER BIOPSY (Bilateral)     Patient location during evaluation: PACU Anesthesia Type: General Level of consciousness: awake and alert Pain management: pain level controlled Vital Signs Assessment: post-procedure vital signs reviewed and stable Respiratory status: spontaneous breathing, nonlabored ventilation, respiratory function stable and patient connected to nasal cannula oxygen Cardiovascular status: blood pressure returned to baseline and stable Postop Assessment: no signs of nausea or vomiting Anesthetic complications: no    Last Vitals:  Vitals:   02/01/17 1440 02/01/17 1450  BP:  (!) 168/86  Pulse: 62 (!) 54  Resp: 13 15  Temp:    SpO2: 98% 97%    Last Pain:  Vitals:   02/01/17 1430  TempSrc:   PainSc: Asleep                 Andrez Lieurance S

## 2017-02-01 NOTE — Transfer of Care (Signed)
Immediate Anesthesia Transfer of Care Note  Patient: Marvin Gill  Procedure(s) Performed: Procedure(s): CYSTOSCOPY/URETEROSCOPY/HOLMIUM LASER/STENT PLACEMENT,STONE BASKETRY, BLADDER BIOPSY (Bilateral)  Patient Location: PACU  Anesthesia Type:General  Level of Consciousness: drowsy and responds to stimulation  Airway & Oxygen Therapy: Patient Spontanous Breathing and Patient connected to nasal cannula oxygen  Post-op Assessment: Report given to RN  Post vital signs: Reviewed and stable  Last Vitals: 157/78, 59, 10, 97%, 98.5 Vitals:   02/01/17 1057  BP: (!) 150/89  Pulse: (!) 54  Resp: 18  Temp: 36.5 C  SpO2: 100%    Last Pain:  Vitals:   02/01/17 1118  TempSrc:   PainSc: 5       Patients Stated Pain Goal: 6 (30/05/11 0211)  Complications: No apparent anesthesia complications

## 2017-02-01 NOTE — Anesthesia Preprocedure Evaluation (Addendum)
Anesthesia Evaluation  Patient identified by MRN, date of birth, ID band Patient awake    Reviewed: Allergy & Precautions, NPO status , Patient's Chart, lab work & pertinent test results  Airway Mallampati: I  TM Distance: >3 FB Neck ROM: Full    Dental no notable dental hx.    Pulmonary neg pulmonary ROS, former smoker,    Pulmonary exam normal breath sounds clear to auscultation       Cardiovascular + CAD and + Peripheral Vascular Disease  Normal cardiovascular exam Rhythm:Regular Rate:Normal     Neuro/Psych Anxiety negative neurological ROS  negative psych ROS   GI/Hepatic Neg liver ROS, GERD  Controlled and Medicated,  Endo/Other  Hypothyroidism   Renal/GU negative Renal ROS  negative genitourinary   Musculoskeletal negative musculoskeletal ROS (+)   Abdominal   Peds negative pediatric ROS (+)  Hematology negative hematology ROS (+)   Anesthesia Other Findings   Reproductive/Obstetrics negative OB ROS                            Anesthesia Physical Anesthesia Plan  ASA: III  Anesthesia Plan: General   Post-op Pain Management:    Induction: Intravenous  PONV Risk Score and Plan: 2 and Ondansetron, Dexamethasone and Treatment may vary due to age or medical condition  Airway Management Planned: LMA  Additional Equipment:   Intra-op Plan:   Post-operative Plan: Extubation in OR  Informed Consent: I have reviewed the patients History and Physical, chart, labs and discussed the procedure including the risks, benefits and alternatives for the proposed anesthesia with the patient or authorized representative who has indicated his/her understanding and acceptance.     Plan Discussed with: CRNA and Surgeon  Anesthesia Plan Comments:        Anesthesia Quick Evaluation

## 2017-02-02 ENCOUNTER — Encounter (HOSPITAL_BASED_OUTPATIENT_CLINIC_OR_DEPARTMENT_OTHER): Payer: Self-pay | Admitting: Urology

## 2017-02-03 DIAGNOSIS — M9902 Segmental and somatic dysfunction of thoracic region: Secondary | ICD-10-CM | POA: Diagnosis not present

## 2017-02-03 DIAGNOSIS — R69 Illness, unspecified: Secondary | ICD-10-CM | POA: Diagnosis not present

## 2017-02-08 DIAGNOSIS — M9902 Segmental and somatic dysfunction of thoracic region: Secondary | ICD-10-CM | POA: Diagnosis not present

## 2017-02-08 DIAGNOSIS — R69 Illness, unspecified: Secondary | ICD-10-CM | POA: Diagnosis not present

## 2017-02-09 DIAGNOSIS — N202 Calculus of kidney with calculus of ureter: Secondary | ICD-10-CM | POA: Diagnosis not present

## 2017-02-09 DIAGNOSIS — N200 Calculus of kidney: Secondary | ICD-10-CM | POA: Diagnosis not present

## 2017-02-24 DIAGNOSIS — E039 Hypothyroidism, unspecified: Secondary | ICD-10-CM | POA: Diagnosis not present

## 2017-02-24 DIAGNOSIS — I2510 Atherosclerotic heart disease of native coronary artery without angina pectoris: Secondary | ICD-10-CM | POA: Diagnosis not present

## 2017-02-24 DIAGNOSIS — N400 Enlarged prostate without lower urinary tract symptoms: Secondary | ICD-10-CM | POA: Diagnosis not present

## 2017-02-24 DIAGNOSIS — M169 Osteoarthritis of hip, unspecified: Secondary | ICD-10-CM | POA: Diagnosis not present

## 2017-02-24 DIAGNOSIS — F329 Major depressive disorder, single episode, unspecified: Secondary | ICD-10-CM | POA: Diagnosis not present

## 2017-02-24 DIAGNOSIS — F325 Major depressive disorder, single episode, in full remission: Secondary | ICD-10-CM | POA: Diagnosis not present

## 2017-03-07 DIAGNOSIS — N202 Calculus of kidney with calculus of ureter: Secondary | ICD-10-CM | POA: Diagnosis not present

## 2017-03-07 DIAGNOSIS — N5201 Erectile dysfunction due to arterial insufficiency: Secondary | ICD-10-CM | POA: Diagnosis not present

## 2017-03-07 DIAGNOSIS — N400 Enlarged prostate without lower urinary tract symptoms: Secondary | ICD-10-CM | POA: Diagnosis not present

## 2017-03-11 DIAGNOSIS — F325 Major depressive disorder, single episode, in full remission: Secondary | ICD-10-CM | POA: Diagnosis not present

## 2017-03-11 DIAGNOSIS — Z23 Encounter for immunization: Secondary | ICD-10-CM | POA: Diagnosis not present

## 2017-05-05 DIAGNOSIS — M48062 Spinal stenosis, lumbar region with neurogenic claudication: Secondary | ICD-10-CM | POA: Diagnosis not present

## 2017-05-05 DIAGNOSIS — M5416 Radiculopathy, lumbar region: Secondary | ICD-10-CM | POA: Diagnosis not present

## 2017-05-31 DIAGNOSIS — I499 Cardiac arrhythmia, unspecified: Secondary | ICD-10-CM

## 2017-05-31 HISTORY — DX: Cardiac arrhythmia, unspecified: I49.9

## 2017-08-04 ENCOUNTER — Other Ambulatory Visit: Payer: Self-pay | Admitting: Urology

## 2017-08-04 ENCOUNTER — Encounter (HOSPITAL_BASED_OUTPATIENT_CLINIC_OR_DEPARTMENT_OTHER): Payer: Self-pay | Admitting: *Deleted

## 2017-08-09 ENCOUNTER — Encounter (HOSPITAL_BASED_OUTPATIENT_CLINIC_OR_DEPARTMENT_OTHER): Payer: Self-pay | Admitting: *Deleted

## 2017-08-09 ENCOUNTER — Other Ambulatory Visit: Payer: Self-pay

## 2017-08-09 NOTE — Progress Notes (Signed)
SPOKE WITH Nur NPO AFTER MIDNIGHT, ARRIVE [address] 08-15-17  MEDS TO TAKE WITH SIP OF WATER: XANAX, GABAPENTIN, PRAVASTAIN, FLOMAX, LEVOTHYROXINE, NEXIUM, VALTREX. 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

## 2017-08-15 ENCOUNTER — Other Ambulatory Visit: Payer: Self-pay

## 2017-08-15 ENCOUNTER — Encounter (HOSPITAL_BASED_OUTPATIENT_CLINIC_OR_DEPARTMENT_OTHER): Payer: Self-pay | Admitting: *Deleted

## 2017-08-15 ENCOUNTER — Encounter (HOSPITAL_BASED_OUTPATIENT_CLINIC_OR_DEPARTMENT_OTHER)
Admission: RE | Disposition: A | Discharge status: Home / Self Care | Payer: Self-pay | Source: Ambulatory Visit | Attending: Urology

## 2017-08-15 ENCOUNTER — Ambulatory Visit (HOSPITAL_BASED_OUTPATIENT_CLINIC_OR_DEPARTMENT_OTHER): Payer: Medicare Other | Admitting: Anesthesiology

## 2017-08-15 ENCOUNTER — Emergency Department (HOSPITAL_COMMUNITY)
Admission: EM | Admit: 2017-08-15 | Discharge: 2017-08-16 | Disposition: A | Discharge status: Home / Self Care | Payer: Medicare Other | Source: Home / Self Care | Attending: Emergency Medicine | Admitting: Emergency Medicine

## 2017-08-15 ENCOUNTER — Encounter (HOSPITAL_COMMUNITY): Payer: Self-pay

## 2017-08-15 ENCOUNTER — Ambulatory Visit (HOSPITAL_BASED_OUTPATIENT_CLINIC_OR_DEPARTMENT_OTHER)
Admission: RE | Admit: 2017-08-15 | Discharge: 2017-08-15 | Disposition: A | Discharge status: Home / Self Care | Payer: Medicare Other | Source: Ambulatory Visit | Attending: Urology | Admitting: Urology

## 2017-08-15 DIAGNOSIS — Z791 Long term (current) use of non-steroidal anti-inflammatories (NSAID): Secondary | ICD-10-CM | POA: Diagnosis not present

## 2017-08-15 DIAGNOSIS — Z87891 Personal history of nicotine dependence: Secondary | ICD-10-CM | POA: Insufficient documentation

## 2017-08-15 DIAGNOSIS — N200 Calculus of kidney: Secondary | ICD-10-CM | POA: Diagnosis present

## 2017-08-15 DIAGNOSIS — Z7982 Long term (current) use of aspirin: Secondary | ICD-10-CM

## 2017-08-15 DIAGNOSIS — E039 Hypothyroidism, unspecified: Secondary | ICD-10-CM

## 2017-08-15 DIAGNOSIS — I739 Peripheral vascular disease, unspecified: Secondary | ICD-10-CM | POA: Diagnosis not present

## 2017-08-15 DIAGNOSIS — I252 Old myocardial infarction: Secondary | ICD-10-CM | POA: Insufficient documentation

## 2017-08-15 DIAGNOSIS — I259 Chronic ischemic heart disease, unspecified: Secondary | ICD-10-CM | POA: Insufficient documentation

## 2017-08-15 DIAGNOSIS — R350 Frequency of micturition: Secondary | ICD-10-CM | POA: Diagnosis not present

## 2017-08-15 DIAGNOSIS — N3081 Other cystitis with hematuria: Secondary | ICD-10-CM | POA: Diagnosis not present

## 2017-08-15 DIAGNOSIS — K219 Gastro-esophageal reflux disease without esophagitis: Secondary | ICD-10-CM | POA: Insufficient documentation

## 2017-08-15 DIAGNOSIS — R339 Retention of urine, unspecified: Secondary | ICD-10-CM

## 2017-08-15 DIAGNOSIS — Z79899 Other long term (current) drug therapy: Secondary | ICD-10-CM

## 2017-08-15 DIAGNOSIS — N5201 Erectile dysfunction due to arterial insufficiency: Secondary | ICD-10-CM | POA: Insufficient documentation

## 2017-08-15 DIAGNOSIS — F419 Anxiety disorder, unspecified: Secondary | ICD-10-CM | POA: Insufficient documentation

## 2017-08-15 DIAGNOSIS — R351 Nocturia: Secondary | ICD-10-CM | POA: Insufficient documentation

## 2017-08-15 DIAGNOSIS — N202 Calculus of kidney with calculus of ureter: Secondary | ICD-10-CM | POA: Diagnosis not present

## 2017-08-15 DIAGNOSIS — I2510 Atherosclerotic heart disease of native coronary artery without angina pectoris: Secondary | ICD-10-CM | POA: Insufficient documentation

## 2017-08-15 DIAGNOSIS — D494 Neoplasm of unspecified behavior of bladder: Secondary | ICD-10-CM

## 2017-08-15 DIAGNOSIS — Z79890 Hormone replacement therapy (postmenopausal): Secondary | ICD-10-CM | POA: Insufficient documentation

## 2017-08-15 DIAGNOSIS — N401 Enlarged prostate with lower urinary tract symptoms: Secondary | ICD-10-CM | POA: Insufficient documentation

## 2017-08-15 HISTORY — DX: Other specified postprocedural states: Z98.890

## 2017-08-15 HISTORY — DX: Benign prostatic hyperplasia with lower urinary tract symptoms: N40.1

## 2017-08-15 HISTORY — PX: CYSTOSCOPY WITH FULGERATION: SHX6638

## 2017-08-15 HISTORY — DX: Other specified postprocedural states: R11.2

## 2017-08-15 LAB — URINALYSIS, ROUTINE W REFLEX MICROSCOPIC
BACTERIA UA: NONE SEEN
BILIRUBIN URINE: NEGATIVE
Glucose, UA: 50 mg/dL — AB
Ketones, ur: NEGATIVE mg/dL
Leukocytes, UA: NEGATIVE
NITRITE: POSITIVE — AB
PROTEIN: 100 mg/dL — AB
Specific Gravity, Urine: 1.01 (ref 1.005–1.030)
Squamous Epithelial / LPF: NONE SEEN
pH: 8 (ref 5.0–8.0)

## 2017-08-15 LAB — I-STAT CHEM 8, ED
BUN: 14 mg/dL (ref 6–20)
CALCIUM ION: 1.16 mmol/L (ref 1.15–1.40)
Chloride: 102 mmol/L (ref 101–111)
Creatinine, Ser: 0.8 mg/dL (ref 0.61–1.24)
Glucose, Bld: 181 mg/dL — ABNORMAL HIGH (ref 65–99)
HEMATOCRIT: 42 % (ref 39.0–52.0)
HEMOGLOBIN: 14.3 g/dL (ref 13.0–17.0)
Potassium: 3.9 mmol/L (ref 3.5–5.1)
Sodium: 138 mmol/L (ref 135–145)
TCO2: 22 mmol/L (ref 22–32)

## 2017-08-15 LAB — POCT I-STAT 4, (NA,K, GLUC, HGB,HCT)
Glucose, Bld: 93 mg/dL (ref 65–99)
Glucose, Bld: 93 mg/dL (ref 65–99)
HCT: 48 % (ref 39.0–52.0)
HEMATOCRIT: 48 % (ref 39.0–52.0)
HEMOGLOBIN: 16.3 g/dL (ref 13.0–17.0)
Hemoglobin: 16.3 g/dL (ref 13.0–17.0)
POTASSIUM: 4.2 mmol/L (ref 3.5–5.1)
Potassium: 4.2 mmol/L (ref 3.5–5.1)
Sodium: 144 mmol/L (ref 135–145)
Sodium: 144 mmol/L (ref 135–145)

## 2017-08-15 SURGERY — CYSTOSCOPY, WITH BLADDER FULGURATION
Anesthesia: General | Site: Bladder

## 2017-08-15 MED ORDER — HYDROCODONE-ACETAMINOPHEN 5-325 MG PO TABS: 1.0000 | ORAL_TABLET | ORAL | 0 refills | Status: DC | PRN

## 2017-08-15 MED ORDER — DEXAMETHASONE SODIUM PHOSPHATE 10 MG/ML IJ SOLN
INTRAMUSCULAR | Status: AC
  Filled 2017-08-15: qty 1

## 2017-08-15 MED ORDER — MIDAZOLAM HCL 5 MG/5ML IJ SOLN
INTRAMUSCULAR | Status: DC | PRN
  Administered 2017-08-15: 2 mg via INTRAVENOUS

## 2017-08-15 MED ORDER — ONDANSETRON HCL 4 MG/2ML IJ SOLN
INTRAMUSCULAR | Status: DC | PRN
  Administered 2017-08-15: 4 mg via INTRAVENOUS

## 2017-08-15 MED ORDER — MIDAZOLAM HCL 2 MG/2ML IJ SOLN
INTRAMUSCULAR | Status: AC
  Filled 2017-08-15: qty 2

## 2017-08-15 MED ORDER — LACTATED RINGERS IV SOLN
INTRAVENOUS | Status: DC
  Administered 2017-08-15 (×2): via INTRAVENOUS
  Filled 2017-08-15: qty 1000

## 2017-08-15 MED ORDER — LIDOCAINE 2% (20 MG/ML) 5 ML SYRINGE
INTRAMUSCULAR | Status: AC
  Filled 2017-08-15: qty 5

## 2017-08-15 MED ORDER — FENTANYL CITRATE (PF) 100 MCG/2ML IJ SOLN
25.0000 ug | INTRAMUSCULAR | Status: DC | PRN
  Filled 2017-08-15: qty 1

## 2017-08-15 MED ORDER — ONDANSETRON HCL 4 MG/2ML IJ SOLN
INTRAMUSCULAR | Status: AC
  Filled 2017-08-15: qty 2

## 2017-08-15 MED ORDER — CEFAZOLIN SODIUM-DEXTROSE 2-4 GM/100ML-% IV SOLN
2.0000 g | Freq: Once | INTRAVENOUS | Status: AC
  Administered 2017-08-15: 2 g via INTRAVENOUS
  Filled 2017-08-15: qty 100

## 2017-08-15 MED ORDER — LIDOCAINE 2% (20 MG/ML) 5 ML SYRINGE
INTRAMUSCULAR | Status: DC | PRN
  Administered 2017-08-15: 40 mg via INTRAVENOUS

## 2017-08-15 MED ORDER — PROPOFOL 10 MG/ML IV BOLUS
INTRAVENOUS | Status: DC | PRN
  Administered 2017-08-15: 200 mg via INTRAVENOUS

## 2017-08-15 MED ORDER — CEPHALEXIN 500 MG PO CAPS: 500.0000 mg | ORAL_CAPSULE | Freq: Three times a day (TID) | ORAL | 0 refills | Status: DC

## 2017-08-15 MED ORDER — PROMETHAZINE HCL 25 MG/ML IJ SOLN
6.2500 mg | INTRAMUSCULAR | Status: DC | PRN
  Filled 2017-08-15: qty 1

## 2017-08-15 MED ORDER — HYDROCODONE-ACETAMINOPHEN 7.5-325 MG PO TABS
1.0000 | ORAL_TABLET | Freq: Once | ORAL | Status: AC | PRN
  Administered 2017-08-15: 1 via ORAL
  Filled 2017-08-15: qty 1

## 2017-08-15 MED ORDER — GLYCOPYRROLATE 0.2 MG/ML IJ SOLN
INTRAMUSCULAR | Status: DC | PRN
  Administered 2017-08-15: 0.2 mg via INTRAVENOUS

## 2017-08-15 MED ORDER — GLYCOPYRROLATE 0.2 MG/ML IV SOSY
PREFILLED_SYRINGE | INTRAVENOUS | Status: AC
  Filled 2017-08-15: qty 5

## 2017-08-15 MED ORDER — MEPERIDINE HCL 25 MG/ML IJ SOLN
6.2500 mg | INTRAMUSCULAR | Status: DC | PRN
  Filled 2017-08-15: qty 1

## 2017-08-15 MED ORDER — FENTANYL CITRATE (PF) 100 MCG/2ML IJ SOLN
INTRAMUSCULAR | Status: DC | PRN
  Administered 2017-08-15 (×2): 25 ug via INTRAVENOUS

## 2017-08-15 MED ORDER — DEXAMETHASONE SODIUM PHOSPHATE 10 MG/ML IJ SOLN
INTRAMUSCULAR | Status: DC | PRN
  Administered 2017-08-15: 10 mg via INTRAVENOUS

## 2017-08-15 MED ORDER — STERILE WATER FOR IRRIGATION IR SOLN
Status: DC | PRN
  Administered 2017-08-15: 3000 mL

## 2017-08-15 MED ORDER — PROPOFOL 10 MG/ML IV BOLUS
INTRAVENOUS | Status: AC
  Filled 2017-08-15: qty 20

## 2017-08-15 MED ORDER — FENTANYL CITRATE (PF) 100 MCG/2ML IJ SOLN
INTRAMUSCULAR | Status: AC
  Filled 2017-08-15: qty 2

## 2017-08-15 MED ORDER — CEFAZOLIN SODIUM-DEXTROSE 2-4 GM/100ML-% IV SOLN
INTRAVENOUS | Status: AC
  Filled 2017-08-15: qty 100

## 2017-08-15 MED ORDER — HYDROCODONE-ACETAMINOPHEN 7.5-325 MG PO TABS
ORAL_TABLET | ORAL | Status: AC
  Filled 2017-08-15: qty 1

## 2017-08-15 MED ORDER — LIDOCAINE HCL 2 % EX GEL
1.0000 "application " | Freq: Once | CUTANEOUS | Status: DC | PRN
  Filled 2017-08-15: qty 5

## 2017-08-15 SURGICAL SUPPLY — 21 items
BAG DRAIN URO-CYSTO SKYTR STRL (DRAIN) ×3 IMPLANT
CLOTH BEACON ORANGE TIMEOUT ST (SAFETY) ×3 IMPLANT
ELECT REM PT RETURN 9FT ADLT (ELECTROSURGICAL) ×3
ELECTRODE REM PT RTRN 9FT ADLT (ELECTROSURGICAL) ×1 IMPLANT
GLOVE BIO SURGEON STRL SZ7.5 (GLOVE) ×3 IMPLANT
GLOVE BIOGEL PI IND STRL 8.5 (GLOVE) ×1 IMPLANT
GLOVE BIOGEL PI INDICATOR 8.5 (GLOVE) ×2
GLOVE ECLIPSE 8.5 STRL (GLOVE) ×3 IMPLANT
GOWN STRL REUS W/TWL XL LVL3 (GOWN DISPOSABLE) ×6 IMPLANT
KIT TURNOVER CYSTO (KITS) ×3 IMPLANT
MANIFOLD NEPTUNE II (INSTRUMENTS) ×3 IMPLANT
NDL SAFETY ECLIPSE 18X1.5 (NEEDLE) IMPLANT
NEEDLE HYPO 18GX1.5 SHARP (NEEDLE)
NEEDLE HYPO 22GX1.5 SAFETY (NEEDLE) IMPLANT
NS IRRIG 500ML POUR BTL (IV SOLUTION) IMPLANT
PACK CYSTO (CUSTOM PROCEDURE TRAY) ×3 IMPLANT
SYR 20CC LL (SYRINGE) ×3 IMPLANT
SYR BULB IRRIGATION 50ML (SYRINGE) IMPLANT
TUBE CONNECTING 12'X1/4 (SUCTIONS) ×1
TUBE CONNECTING 12X1/4 (SUCTIONS) ×2 IMPLANT
WATER STERILE IRR 3000ML UROMA (IV SOLUTION) ×3 IMPLANT

## 2017-08-15 NOTE — Anesthesia Preprocedure Evaluation (Addendum)
Anesthesia Evaluation  Patient identified by MRN, date of birth, ID band Patient awake    Reviewed: Allergy & Precautions, NPO status , Patient's Chart, lab work & pertinent test results  History of Anesthesia Complications (+) PONV and history of anesthetic complications  Airway Mallampati: I  TM Distance: >3 FB Neck ROM: Full    Dental no notable dental hx.    Pulmonary neg pulmonary ROS, former smoker,    Pulmonary exam normal breath sounds clear to auscultation       Cardiovascular + CAD and + Peripheral Vascular Disease  Normal cardiovascular exam Rhythm:Regular Rate:Normal     Neuro/Psych Anxiety negative neurological ROS  negative psych ROS   GI/Hepatic Neg liver ROS, GERD  Controlled and Medicated,  Endo/Other  Hypothyroidism   Renal/GU negative Renal ROS     Musculoskeletal  (+) Arthritis , Fibromyalgia -  Abdominal   Peds  Hematology negative hematology ROS (+)   Anesthesia Other Findings   Reproductive/Obstetrics negative OB ROS                             Anesthesia Physical  Anesthesia Plan  ASA: III  Anesthesia Plan: General   Post-op Pain Management:    Induction: Intravenous  PONV Risk Score and Plan: 2 and Ondansetron, Dexamethasone, Treatment may vary due to age or medical condition and Midazolam  Airway Management Planned: LMA  Additional Equipment:   Intra-op Plan:   Post-operative Plan: Extubation in OR  Informed Consent: I have reviewed the patients History and Physical, chart, labs and discussed the procedure including the risks, benefits and alternatives for the proposed anesthesia with the patient or authorized representative who has indicated his/her understanding and acceptance.   Dental advisory given  Plan Discussed with: CRNA  Anesthesia Plan Comments:        Anesthesia Quick Evaluation

## 2017-08-15 NOTE — Op Note (Signed)
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 Same  Procedure: 1. 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  This area was resected in its entirety.  The left ureteral orifice was visible at the end procedure.  EBL: None  Specimens: Bladder tumor to pathology  Drains: None  Disposition: Stable to the postanesthesia care unit  Indication for procedure: The patient is a 67 y.o. 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  He presents today for further evaluation in the operating room.  After reviewing the management options for treatment, the patient elected to proceed with the above surgical procedure(s). We have discussed the potential benefits and risks of the procedure, side effects of the proposed treatment, the likelihood of the patient achieving the goals of the procedure, and any potential problems that might occur during the procedure or recuperation. Informed consent has been obtained.  Description of procedure: The patient was met in the preoperative area. All risks, benefits, and indications of the procedure were described in great detail. The patient consented to the procedure. Preoperative antibiotics were given. The patient was taken to the operative theater. General anesthesia was induced per the anesthesia service. The patient was then placed in the dorsal lithotomy position and prepped and draped in the usual sterile fashion. A preoperative  timeout was called.   A 20 French 30 degree cystoscope was inserted to the patient's bladder per urethra atraumatically.  The patient did have a visually obstructive prostate.  Upon pan cystoscopy there is noted to be erythematous area surrounding the left trigone.  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  At this time it was determined that this area needed transurethral resection.  The cystoscope was exchanged for 24 French resectoscope with visual obturator.  The area around the left ureteral orifice was then resected.  Approximately 3 and half centimeters in size.  All visible tumor was removed.  Muscle was seen in the resection bed.  The ureteral orifice was visible at the end of the procedure.  Hemostasis was obtained and was excellent.  Tumor chips were evacuated and sent to pathology.  There is excellent was removed after draining the bladder.  Patient was awoke from anesthesia and transferred in stable condition the postanesthesia care unit.  Plan: The patient will follow-up in 1 week to discuss pathology results.  We will get a renal ultrasound in 1 month to rule out iatrogenic hydronephrosis due to the proximity of the resection to the ureteral orifice.  Baruch Gouty, M.D.

## 2017-08-15 NOTE — Discharge Instructions (Addendum)
CYSTOSCOPY HOME CARE INSTRUCTIONS  Activity: Rest for the remainder of the day.  Do not drive or operate equipment today.  You may resume normal activities in one to two days as instructed by your physician.   Meals: Drink plenty of liquids and eat light foods such as gelatin or soup this evening.  You may return to a normal meal plan tomorrow.  Return to Work: You may return to work in one to two days or as instructed by your physician.  Special Instructions / Symptoms: Call your physician if any of these symptoms occur:   -persistent or heavy bleeding  -large blood clots that are difficult to pass  -urine stream diminishes or stops completely  -fever equal to or higher than 101 degrees Farenheit.  -cloudy urine with a strong, foul odor  -severe pain   You may feel some burning pain when you urinate.  This should disappear with time.  Applying moist heat to the lower abdomen or a hot tub bath may help relieve the pain. \  Post Anesthesia Home Care Instructions  Activity: Get plenty of rest for the remainder of the day. A responsible individual must stay with you for 24 hours following the procedure.  For the next 24 hours, DO NOT: -Drive a car -Paediatric nurse -Drink alcoholic beverages -Take any medication unless instructed by your physician -Make any legal decisions or sign important papers.  Meals: Start with liquid foods such as gelatin or soup. Progress to regular foods as tolerated. Avoid greasy, spicy, heavy foods. If nausea and/or vomiting occur, drink only clear liquids until the nausea and/or vomiting subsides. Call your physician if vomiting continues.  Special Instructions/Symptoms: Your throat may feel dry or sore from the anesthesia or the breathing tube placed in your throat during surgery. If this causes discomfort, gargle with warm salt water. The discomfort should disappear within 24 hours.  If you had a scopolamine patch placed behind your ear for the  management of post- operative nausea and/or vomiting:  1. The medication in the patch is effective for 72 hours, after which it should be removed.  Wrap patch in a tissue and discard in the trash. Wash hands thoroughly with soap and water. 2. You may remove the patch earlier than 72 hours if you experience unpleasant side effects which may include dry mouth, dizziness or visual disturbances. 3. Avoid touching the patch. Wash your hands with soap and water after contact with the patch.

## 2017-08-15 NOTE — Anesthesia Procedure Notes (Signed)
Procedure Name: LMA Insertion Date/Time: 08/15/2017 1:01 PM Performed by: Verlin Grills, CRNA Pre-anesthesia Checklist: Patient identified, Emergency Drugs available, Suction available and Patient being monitored Patient Re-evaluated:Patient Re-evaluated prior to induction Oxygen Delivery Method: Circle system utilized Preoxygenation: Pre-oxygenation with 100% oxygen Induction Type: IV induction Ventilation: Mask ventilation without difficulty LMA: LMA inserted LMA Size: 4.0 Number of attempts: 1 Placement Confirmation: positive ETCO2 and breath sounds checked- equal and bilateral Dental Injury: Teeth and Oropharynx as per pre-operative assessment

## 2017-08-15 NOTE — H&P (Signed)
CC: I have kidney stones.  HPI: Marvin Gill is a 66 year-old male established patient who is here for renal calculi.    Patient with known nonobstructing 9 mm lower right renal stone. Also less than 2 mm lower left renal stones. This was confirmed on CT urogram in February 2019.     CC: BPH  HPI:   Patient notes significant hesitancy and straining. This was much better while he was on Flomax for his kidney stone. He currently has resumed this.     CC: I am having trouble with my erections.  HPI:   Patient notes difficulty obtaining and maintaining an erection suitable for intercourse. He is interested in trying a medication for this. He is not on nitrates and is able to walk up multiple flights of stairs without difficulty.   Currently on sildenafil.     CC/HPI: I have been told that I have blood in my urine.     Patient presents for completion of microscopic hematuria workup with cystoscopy. His microscopic hematuria has been worsening despite his known stone burden. CT urogram was negative except for known stones bilaterally.    ALLERGIES: Codeine Derivatives - Nausea Rapaflo CAPS - Other Reaction, unknown    MEDICATIONS: Flomax 0.4 mg capsule 1 capsule PO Q HS  ALPRAZolam 1 MG Oral Tablet Oral  Aspirin Ec 81 mg tablet, delayed release Oral  Gabapentin 600 mg tablet Oral  Levothyroxine Sodium 50 MCG Oral Tablet Oral  Meloxicam 15 mg tablet 1 tablet PO Daily  Pravastatin Sodium 40 MG Oral Tablet Oral  Sildenafil 20 mg tablet Take 1 to 5 tabs PO daily prn     GU PSH: Cysto Bladder Ureth Biopsy - 02/01/2017 Cysto Remove Stent FB Sim - 02/09/2017 Cystoscopy Insert Stent, Right - 02/01/2017 Locm 300-399Mg /Ml Iodine,1Ml - 12/02/2016 Revise Bladder Neck - 2015 Ureteroscopic laser litho, Left - 02/01/2017 UROLIFT - 2017    NON-GU PSH: Heart Surgery (Unspecified)    GU PMH: Disorder of male genital organs, unspecified (Acute), Left, Meloxicam 15 mg 1 po daily.  Phenazopyridine 200 mg 1 po TID PRN. Instructed to use good scrotal support and heat for discomfort - 07/22/2017 Microscopic hematuria (Worsening, Chronic), Send urine for cytology. With progressively worsening microsocpic hematuria and dysuria and hx of tobacco use will have pt RTC for possible cysto w/Dr. Pilar Jarvis. - 07/22/2017 BPH w/o LUTS - 06/29/2017, - 03/07/2017, - 03/02/2016 ED due to arterial insufficiency - 06/29/2017, - 03/07/2017 Renal calculus - 06/29/2017, - 03/07/2017, - 02/09/2017, - 12/13/2016, Nephrolithiasis, - 2014 Ureteral calculus - 06/29/2017, - 03/07/2017, - 02/09/2017, - 12/13/2016, Left, - 12/09/2016 Dysuria - 12/09/2016, - 11/23/2016 (Worsening, Chronic), Culture urine. No ABX unless culture proven UTI. Uribel 118 mg 1 po TID PRN. Discussed avoidance of bladder irritants and increasing water intake. , - 07/14/2016 Chronic prostatitis - 11/23/2016 BPH w/LUTS, Benign localized prostatic hyperplasia with lower urinary tract symptoms (LUTS) - 08/27/2015 Urinary Retention, Acute retention of urine - 2017 Nocturia, Nocturia - 2017 Urinary Frequency, Urinary frequency - 2017 Urinary Retention, Unspec, Urinary retention - 2016 Other microscopic hematuria, Microscopic hematuria - 2016 Low back pain, Lower back pain - 2014 Urinary Tract Inf, Unspec site, Urinary tract infection - 2014      PMH Notes: 2012-08-14 15:58:37 - Note: No Orgasmic Sensation Felt During Ejaculation     NON-GU PMH: Encounter for general adult medical examination without abnormal findings, Encounter for preventive health examination - 2017 Anxiety, Anxiety - 2014 Personal history of other specified conditions, History  of heartburn - 2014 Acute myocardial infarction, unspecified Arthritis Fibromyalgia    FAMILY HISTORY: Brain tumor - Runs In Family Family Health Status - Father alive at age 105 - Mother Family Health Status Number - Runs In Family Mother Deceased At Age 25 from diabetic complicati - Runs In Family    SOCIAL HISTORY: Marital Status: Married Preferred Language: English; Ethnicity: Not Hispanic Or Latino; Race: White Current Smoking Status: Patient does not smoke anymore.  Does not use smokeless tobacco. Has never drank.  Does not use drugs. Drinks 2 caffeinated drinks per day.    REVIEW OF SYSTEMS:    GU Review Male:   Patient reports frequent urination, burning/ pain with urination, get up at night to urinate, and trouble starting your stream. Patient denies hard to postpone urination, leakage of urine, stream starts and stops, have to strain to urinate , erection problems, and penile pain.  Gastrointestinal (Upper):   Patient reports nausea and indigestion/ heartburn. Patient denies vomiting.  Gastrointestinal (Lower):   Patient denies diarrhea and constipation.  Constitutional:   Patient reports fever, night sweats, and fatigue. Patient denies weight loss.  Skin:   Patient denies skin rash/ lesion and itching.  Eyes:   Patient denies blurred vision and double vision.  Ears/ Nose/ Throat:   Patient denies sore throat and sinus problems.  Hematologic/Lymphatic:   Patient denies easy bruising and swollen glands.  Cardiovascular:   Patient denies leg swelling and chest pains.  Respiratory:   Patient denies cough and shortness of breath.  Endocrine:   Patient denies excessive thirst.  Musculoskeletal:   Patient reports back pain and joint pain.   Neurological:   Patient denies headaches and dizziness.  Psychologic:   Patient reports depression and anxiety.    VITAL SIGNS:      08/02/2017 02:52 PM  Weight 230 lb / 104.33 kg  Height 78 in / 198.12 cm  BP 125/81 mmHg  Pulse 75 /min  Temperature 97.6 F / 36.4 C  BMI 26.6 kg/m   MULTI-SYSTEM PHYSICAL EXAMINATION:    Constitutional: Well-nourished. No physical deformities. Normally developed. Good grooming.  Neck: Neck symmetrical, not swollen. Normal tracheal position.  Respiratory: No labored breathing, no use of accessory  muscles. Normal breath sounds.  Cardiovascular: Regular rate and rhythm. No murmur, no gallop. Normal temperature, normal extremity pulses, no swelling, no varicosities.  Skin: No paleness, no jaundice, no cyanosis. No lesion, no ulcer, no rash.  Gastrointestinal: No mass, no tenderness, no rigidity, non obese abdomen.  Eyes: Normal conjunctivae. Normal eyelids.  Ears, Nose, Mouth, and Throat: Left ear no scars, no lesions, no masses. Right ear no scars, no lesions, no masses. Nose no scars, no lesions, no masses. Normal hearing. Normal lips.  Musculoskeletal: Normal gait and station of head and neck.     PAST DATA REVIEWED:  Source Of History:  Patient   01/25/14 08/15/12 12/17/10 10/31/09 02/20/08  PSA  Total PSA 0.54  0.50  0.57  0.52  0.79     PROCEDURES:         Flexible Cystoscopy - 52000  Risks, benefits, and some of the potential complications of the procedure were discussed at length with the patient including infection, bleeding, voiding discomfort, urinary retention, fever, chills, sepsis, and others. All questions were answered. Informed consent was obtained. Antibiotic prophylaxis was given. Sterile technique and intraurethral analgesia were used.  Meatus:  Normal size. Normal location. Normal condition.  Urethra:  No strictures.  External Sphincter:  Normal.  Verumontanum:  Normal.  Prostate:  Obstructing. Moderate hyperplasia. Approximately 5-6 cm in length. No median lobe  Bladder Neck:  Non-obstructing.  Ureteral Orifices:  Normal location. Normal size. Normal shape. Effluxed clear urine.  Bladder:  No obvious exophytic lesions. There is a erythematous patch in the right lower floor of the bladder laterally that is concerning for possible CIS.      The lower urinary tract was carefully examined. The procedure was well-tolerated and without complications. Antibiotic instructions were given. Instructions were given to call the office immediately for bloody urine,  difficulty urinating, urinary retention, painful or frequent urination, fever, chills, nausea, vomiting or other illness. The patient stated that he understood these instructions and would comply with them.         Urinalysis w/Scope - 81001 Dipstick Dipstick Cont'd Micro  Color: Amber Bilirubin: Neg WBC/hpf: 0 - 5/hpf  Appearance: Cloudy Ketones: Neg RBC/hpf: 10 - 20/hpf  Specific Gravity: <=1.005 Blood: 2+ Bacteria: Few (10-25/hpf)  pH: 5.5 Protein: 1+ Cystals: NS (Not Seen)  Glucose: Neg Urobilinogen: 1.0 Casts: NS (Not Seen)    Nitrites: Neg Trichomonas: Not Present    Leukocyte Esterase: Neg Mucous: Present      Epithelial Cells: 0 - 5/hpf      Yeast: NS (Not Seen)      Sperm: Not Present    ASSESSMENT:      ICD-10 Details  1 GU:   Ureteral calculus - N20.1   2   Renal calculus - N20.0   3   ED due to arterial insufficiency - N52.01   4   BPH w/o LUTS - N40.0   5   Microscopic hematuria - R31.1   6   Bladder disorder, Unspec - N32.9    PLAN:           Orders Labs Urine Culture          Document Letter(s):  Created for Patient: Clinical Summary         Notes:   1. Bladder lesion  -cysto, bladder bx, possible TURBT. We discussed the risks, benefits, indications of these procedures. He understands the risks including but limited to bleeding, infection, iatrogenic injury, need for repeat procedures, and need for postoperative catheterization. He is agreeable to proceeding.   2. Microhematuria  -Secondary to possibly involved as well as nephrolithiasis   3. Nephrolithiasis  Continue active surveillance of left lower pole sotne   4. Erectile dysfunction  Continue sildenadil   5. BPH  Continue flomax

## 2017-08-15 NOTE — ED Triage Notes (Signed)
Pt presents with urinary retention after bladder surgery this morning. He states he has passed small blood clots and once he got home, he was unable to urinate.

## 2017-08-15 NOTE — Transfer of Care (Signed)
Immediate Anesthesia Transfer of Care Note  Patient: Marvin Gill  Procedure(s) Performed: CYSTOSCOP/ BLADDER BIOPSY/TURBT (N/A Bladder)  Patient Location: PACU  Anesthesia Type:General  Level of Consciousness: sedated, drowsy and patient cooperative  Airway & Oxygen Therapy: Patient Spontanous Breathing  Post-op Assessment: Report given to RN and Post -op Vital signs reviewed and stable  Post vital signs: Reviewed and stable  Last Vitals:  Vitals:   08/15/17 1005 08/15/17 1328  BP: 129/75 119/68  Pulse: 61 82  Resp: 19 16  Temp: (!) 36.4 C 37.2 C  SpO2: 98% 93%    Last Pain:  Vitals:   08/15/17 1036  TempSrc:   PainSc: 8       Patients Stated Pain Goal: 0 (34/35/68 6168)  Complications: No apparent anesthesia complications

## 2017-08-16 ENCOUNTER — Encounter (HOSPITAL_COMMUNITY): Payer: Self-pay | Admitting: Emergency Medicine

## 2017-08-16 NOTE — Anesthesia Postprocedure Evaluation (Signed)
Anesthesia Post Note  Patient: Marvin Gill  Procedure(s) Performed: CYSTOSCOP/ BLADDER BIOPSY/TURBT (N/A Bladder)     Patient location during evaluation: PACU Anesthesia Type: General Level of consciousness: sedated and patient cooperative Pain management: pain level controlled Vital Signs Assessment: post-procedure vital signs reviewed and stable Respiratory status: spontaneous breathing Cardiovascular status: stable Anesthetic complications: no    Last Vitals:  Vitals:   08/15/17 1415 08/15/17 1542  BP: 126/63 137/81  Pulse: 90 89  Resp: 18 16  Temp:  36.8 C  SpO2: 94% 100%    Last Pain:  Vitals:   08/15/17 1542  TempSrc: Oral  PainSc:                  Nolon Nations

## 2017-08-16 NOTE — ED Provider Notes (Signed)
[facility] DEPT Provider Note   CSN: [id number] Arrival date & time: 08/15/17  2216     History   Chief Complaint Chief Complaint  Patient presents with  . Urinary Retention    HPI Marvin Gill is a 67 y.o. male.  The history is provided by the patient.  Dysuria   This is a new problem. The current episode started 3 to 5 hours ago. The problem occurs every urination. The problem has not changed since onset.The quality of the pain is described as burning. The pain is severe. There has been no fever. There is no history of pyelonephritis. Associated symptoms include hesitancy. Pertinent negatives include no chills. He has tried nothing for the symptoms. His past medical history is significant for urological procedure. His past medical history does not include kidney stones.  Had bladder scraping then had pain and was unable to void.  No f/c/r.    Past Medical History:  Diagnosis Date  . Aneurysm of abdominal aorta (HCC)    AAA PER PER 08-04-16 3.1 CM X 3.1 CM US AORTA SEES DR GRIFFIN FOR  . Anxiety   . Arthritis    back  . Benign localized prostatic hyperplasia with lower urinary tract symptoms (LUTS)   . Complication of anesthesia   . Coronary artery disease 2000   single vessel cad  . Fibromyalgia   . GERD (gastroesophageal reflux disease)   . History of skin cancer   . Hypothyroidism   . PONV (postoperative nausea and vomiting)    NAUSEA ALL DAY AFTER 02-01-17 SURGERY, PT WANTS ANESTHESIA LIKE 06-23-15 SURGERY WITH DR  [name]  . S/P percutaneous transluminal coronary angioplasty    05-18-1999  to dLAD    Patient Active Problem List   Diagnosis Date Noted  . Benign prostate hyperplasia 04/01/2014    Past Surgical History:  Procedure Laterality Date  . CARDIAC CATHETERIZATION  06-14-2001  dr Einar Gip   normal coronary arteries and patent previous dLAD   . COLONOSCOPY WITH PROPOFOL N/A 12/13/2013   Procedure: COLONOSCOPY WITH  PROPOFOL;  Surgeon: Garlan Fair, MD;  Location: [facility];  Service: Endoscopy;  Laterality: N/A;  . CORONARY ANGIOPLASTY  05-18-1999  dr Tami Ribas   PTCA balloon to dLAD 80% (single vessel disease),  ef 60%  . CYSTOSCOPY WITH INSERTION OF UROLIFT N/A 06/23/2015   Procedure: CYSTOSCOPY WITH INSERTION OF UROLIFT;  Surgeon: Carolan Clines, MD;  Location: [facility];  Service: Urology;  Laterality: N/A;  . CYSTOSCOPY/URETEROSCOPY/HOLMIUM LASER/STENT PLACEMENT Bilateral 02/01/2017   Procedure: CYSTOSCOPY/URETEROSCOPY/HOLMIUM LASER/STENT PLACEMENT,STONE BASKETRY, BLADDER BIOPSY;  Surgeon: Nickie Retort, MD;  Location: [facility];  Service: Urology;  Laterality: Bilateral;  . ESOPHAGOGASTRODUODENOSCOPY  08-24-2006  . TRANSURETHRAL INCISION OF PROSTATE N/A 04/01/2014   Procedure: TRANSURETHRAL INCISION OF THE PROSTATE (TUIP);  Surgeon: Ailene Rud, MD;  Location: [facility]  Service: Urology;  Laterality: N/A;       Home Medications    Prior to Admission medications   Medication Sig Start Date End Date Taking? Authorizing Provider  ALPRAZolam Duanne Moron) 1 MG tablet Take 1 mg by mouth 2 (two) times daily.    Yes [provider]  aspirin EC 81 MG tablet Take 81 mg by mouth daily.   Yes [provider]  cephALEXin (KEFLEX) 500 MG capsule Take 1 capsule (500 mg total) by mouth 3 (three) times daily. 08/15/17  Yes Nickie Retort, MD  esomeprazole (NEXIUM) 40 MG capsule Take 40  mg by mouth every morning.    Yes [provider]  gabapentin (NEURONTIN) 600 MG tablet Take 600 mg by mouth 3 (three) times daily.  05/11/15  Yes [provider]  HYDROcodone-acetaminophen (NORCO) 5-325 MG tablet Take 1 tablet by mouth every 4 (four) hours as needed for moderate pain. 08/15/17  Yes Nickie Retort, MD  levothyroxine (SYNTHROID, LEVOTHROID) 25 MCG tablet Take 25 mcg by mouth daily before breakfast.   Yes [provider]  pravastatin (PRAVACHOL) 40 MG tablet Take 40 mg by mouth daily.  05/07/15  Yes [provider]  tamsulosin (FLOMAX) 0.4 MG CAPS capsule Take 0.4 mg by mouth daily.  05/15/15  Yes [provider]  valACYclovir (VALTREX) 1000 MG tablet Take 1,000 mg by mouth 2 (two) times daily. X 7 DAYS, THEN 500 MG BID X 6 MONTHS   Yes [provider]    Family History Family History  Problem Relation Age of Onset  . Anuerysm Mother        Brain    Social History Social History   Tobacco Use  . Smoking status: Former Smoker    Packs/day: 1.00    Years: 25.00    Pack years: 25.00    Types: Cigarettes    Last attempt to quit: 06/01/2003    Years since quitting: 14.2  . Smokeless tobacco: Never Used  Substance Use Topics  . Alcohol use: No    Frequency: Never  . Drug use: No     Allergies   Codeine   Review of Systems Review of Systems  Constitutional: Negative for chills.  HENT: Negative for congestion.   Respiratory: Negative for shortness of breath.   Cardiovascular: Negative for chest pain.  Genitourinary: Positive for difficulty urinating, dysuria and hesitancy.  All other systems reviewed and are negative.    Physical Exam Updated Vital Signs There were no vitals taken for this visit.  Physical Exam  Constitutional: He is oriented to person, place, and time. He appears well-developed and well-nourished. No distress.  HENT:  Head: Normocephalic and atraumatic.  Mouth/Throat: No oropharyngeal exudate.  Eyes: Conjunctivae are normal. Pupils are equal, round, and reactive to light.  Neck: Normal range of motion. Neck supple.  Cardiovascular: Normal rate, regular rhythm, normal heart sounds and intact distal pulses.  Pulmonary/Chest: Effort normal and breath sounds normal. No stridor. He has no wheezes. He has no rales.  Abdominal: Soft. Bowel sounds are normal. He exhibits no mass. There is no tenderness. There is no rebound and no guarding.    Musculoskeletal: Normal range of motion.  Neurological: He is alert and oriented to person, place, and time.  Skin: Skin is warm and dry. Capillary refill takes less than 2 seconds.  Psychiatric: He has a normal mood and affect.  Nursing note and vitals reviewed.    ED Treatments / Results  Labs (all labs ordered are listed, but only abnormal results are displayed) Results for orders placed or performed during the hospital encounter of 08/15/17  Urinalysis, Routine w reflex microscopic- may I&O cath if menses  Result Value Ref Range   Color, Urine YELLOW YELLOW   APPearance CLOUDY (A) CLEAR   Specific Gravity, Urine 1.010 1.005 - 1.030   pH 8.0 5.0 - 8.0   Glucose, UA 50 (A) NEGATIVE mg/dL   Hgb urine dipstick MODERATE (A) NEGATIVE   Bilirubin Urine NEGATIVE NEGATIVE   Ketones, ur NEGATIVE NEGATIVE mg/dL   Protein, ur 100 (A) NEGATIVE mg/dL  Nitrite POSITIVE (A) NEGATIVE   Leukocytes, UA NEGATIVE NEGATIVE   RBC / HPF TOO NUMEROUS TO COUNT 0 - 5 RBC/hpf   WBC, UA 6-30 0 - 5 WBC/hpf   Bacteria, UA NONE SEEN NONE SEEN   Squamous Epithelial / LPF NONE SEEN NONE SEEN  I-Stat Chem 8, ED  Result Value Ref Range   Sodium 138 135 - 145 mmol/L   Potassium 3.9 3.5 - 5.1 mmol/L   Chloride 102 101 - 111 mmol/L   BUN 14 6 - 20 mg/dL   Creatinine, Ser 0.80 0.61 - 1.24 mg/dL   Glucose, Bld 181 (H) 65 - 99 mg/dL   Calcium, Ion 1.16 1.15 - 1.40 mmol/L   TCO2 22 22 - 32 mmol/L   Hemoglobin 14.3 13.0 - 17.0 g/dL   HCT 42.0 39.0 - 52.0 %   No results found.  Procedures Procedures (including critical care time)  Medications Ordered in ED Medications  lidocaine (XYLOCAINE) 2 % jelly 1 application (not administered)     Foley placed  Final Clinical Impressions(s) / ED Diagnoses   Final diagnoses:  Urinary retention    Return for weakness, numbness, changes in vision or speech, fevers >100.4 unrelieved by medication, shortness of breath, intractable vomiting, or diarrhea,  abdominal pain, Inability to tolerate liquids or food, cough, altered mental status or any concerns. No signs of systemic illness or infection. The patient is nontoxic-appearing on exam and vital signs are within normal limits.   I have reviewed the triage vital signs and the nursing notes. Pertinent labs &imaging results that were available during my care of the patient were reviewed by me and considered in my medical decision making (see chart for details).  After history, exam, and medical workup I feel the patient has been appropriately medically screened and is safe for discharge home. Pertinent diagnoses were discussed with the patient. Patient was given return precautions.   Laurene Melendrez, MD 08/16/17 0569

## 2017-08-17 ENCOUNTER — Other Ambulatory Visit: Payer: Self-pay

## 2017-08-17 ENCOUNTER — Emergency Department (HOSPITAL_COMMUNITY)
Admission: EM | Admit: 2017-08-17 | Discharge: 2017-08-17 | Disposition: A | Discharge status: Home / Self Care | Payer: Medicare Other | Attending: Emergency Medicine | Admitting: Emergency Medicine

## 2017-08-17 ENCOUNTER — Encounter (HOSPITAL_COMMUNITY): Payer: Self-pay | Admitting: Emergency Medicine

## 2017-08-17 DIAGNOSIS — Z87891 Personal history of nicotine dependence: Secondary | ICD-10-CM | POA: Diagnosis not present

## 2017-08-17 DIAGNOSIS — R339 Retention of urine, unspecified: Secondary | ICD-10-CM

## 2017-08-17 DIAGNOSIS — Z79899 Other long term (current) drug therapy: Secondary | ICD-10-CM | POA: Insufficient documentation

## 2017-08-17 LAB — I-STAT CHEM 8, ED
BUN: 20 mg/dL (ref 6–20)
CALCIUM ION: 1.14 mmol/L — AB (ref 1.15–1.40)
CHLORIDE: 105 mmol/L (ref 101–111)
Creatinine, Ser: 0.8 mg/dL (ref 0.61–1.24)
GLUCOSE: 143 mg/dL — AB (ref 65–99)
HCT: 41 % (ref 39.0–52.0)
Hemoglobin: 13.9 g/dL (ref 13.0–17.0)
Potassium: 3.7 mmol/L (ref 3.5–5.1)
Sodium: 141 mmol/L (ref 135–145)
TCO2: 23 mmol/L (ref 22–32)

## 2017-08-17 MED ORDER — LIDOCAINE HCL 2 % EX GEL
1.0000 "application " | Freq: Once | CUTANEOUS | Status: AC | PRN
  Administered 2017-08-17: 1 via URETHRAL
  Filled 2017-08-17: qty 5

## 2017-08-17 NOTE — ED Provider Notes (Signed)
[facility] [place] DEPT Provider Note   CSN: [id number] Arrival date & time: 08/17/17  0133     History   Chief Complaint Chief Complaint  Patient presents with  . Urinary Retention    HPI Marvin Gill is a 67 y.o. male.  The history is provided by the patient and the spouse.  Illness  This is a recurrent problem. The current episode started 3 to 5 hours ago. The problem occurs constantly. The problem has not changed since onset.Pertinent negatives include no chest pain, no abdominal pain, no headaches and no shortness of breath. Nothing aggravates the symptoms. Nothing relieves the symptoms. He has tried nothing for the symptoms. The treatment provided no relief.  Clot in the foley not flowing.  Still taking post op antibiotics.    Past Medical History:  Diagnosis Date  . Aneurysm of abdominal aorta (HCC)    AAA PER PER 08-04-16 3.1 CM X 3.1 CM US AORTA SEES DR GRIFFIN FOR  . Anxiety   . Arthritis    back  . Benign localized prostatic hyperplasia with lower urinary tract symptoms (LUTS)   . Complication of anesthesia   . Coronary artery disease 2000   single vessel cad  . Fibromyalgia   . GERD (gastroesophageal reflux disease)   . History of skin cancer   . Hypothyroidism   . PONV (postoperative nausea and vomiting)    NAUSEA ALL DAY AFTER 02-01-17 SURGERY, PT WANTS ANESTHESIA LIKE 06-23-15 SURGERY WITH DR  [name]  . S/P percutaneous transluminal coronary angioplasty    05-18-1999  to dLAD    Patient Active Problem List   Diagnosis Date Noted  . Benign prostate hyperplasia 04/01/2014    Past Surgical History:  Procedure Laterality Date  . CARDIAC CATHETERIZATION  06-14-2001  dr Einar Gip   normal coronary arteries and patent previous dLAD   . COLONOSCOPY WITH PROPOFOL N/A 12/13/2013   Procedure: COLONOSCOPY WITH PROPOFOL;  Surgeon: Garlan Fair, MD;  Location: [facility];  Service: Endoscopy;  Laterality: N/A;  . CORONARY ANGIOPLASTY   05-18-1999  dr Tami Ribas   PTCA balloon to dLAD 80% (single vessel disease),  ef 60%  . CYSTOSCOPY WITH FULGERATION N/A 08/15/2017   Procedure: CYSTOSCOP/ BLADDER BIOPSY/TURBT;  Surgeon: Nickie Retort, MD;  Location: [facility];  Service: Urology;  Laterality: N/A;  . CYSTOSCOPY WITH INSERTION OF UROLIFT N/A 06/23/2015   Procedure: CYSTOSCOPY WITH INSERTION OF UROLIFT;  Surgeon: Carolan Clines, MD;  Location: [facility];  Service: Urology;  Laterality: N/A;  . CYSTOSCOPY/URETEROSCOPY/HOLMIUM LASER/STENT PLACEMENT Bilateral 02/01/2017   Procedure: CYSTOSCOPY/URETEROSCOPY/HOLMIUM LASER/STENT PLACEMENT,STONE BASKETRY, BLADDER BIOPSY;  Surgeon: Nickie Retort, MD;  Location: [facility];  Service: Urology;  Laterality: Bilateral;  . ESOPHAGOGASTRODUODENOSCOPY  08-24-2006  . TRANSURETHRAL INCISION OF PROSTATE N/A 04/01/2014   Procedure: TRANSURETHRAL INCISION OF THE PROSTATE (TUIP);  Surgeon: Ailene Rud, MD;  Location: [facility];  Service: Urology;  Laterality: N/A;       Home Medications    Prior to Admission medications   Medication Sig Start Date End Date Taking? Authorizing Provider  ALPRAZolam Duanne Moron) 1 MG tablet Take 1 mg by mouth 2 (two) times daily.     [provider]  aspirin EC 81 MG tablet Take 81 mg by mouth daily.    [provider]  cephALEXin (KEFLEX) 500 MG capsule Take 1 capsule (500 mg total) by mouth 3 (three) times daily. 08/15/17   Nickie Retort, MD  esomeprazole (NEXIUM) 40 MG capsule Take 40 mg by mouth every morning.     [provider]  gabapentin (NEURONTIN) 600 MG tablet Take 600 mg by mouth 3 (three) times daily.  05/11/15   [provider]  HYDROcodone-acetaminophen (NORCO) 5-325 MG tablet Take 1 tablet by mouth every 4 (four) hours as needed for moderate pain. 08/15/17   Nickie Retort, MD  levothyroxine (SYNTHROID, LEVOTHROID) 25 MCG tablet Take 25 mcg by  mouth daily before breakfast.    [provider]  pravastatin (PRAVACHOL) 40 MG tablet Take 40 mg by mouth daily.  05/07/15   [provider]  tamsulosin (FLOMAX) 0.4 MG CAPS capsule Take 0.4 mg by mouth daily.  05/15/15   [provider]  valACYclovir (VALTREX) 1000 MG tablet Take 1,000 mg by mouth 2 (two) times daily. X 7 DAYS, THEN 500 MG BID X 6 MONTHS    [provider]    Family History Family History  Problem Relation Age of Onset  . Anuerysm Mother        Brain    Social History Social History   Tobacco Use  . Smoking status: Former Smoker    Packs/day: 1.00    Years: 25.00    Pack years: 25.00    Types: Cigarettes    Last attempt to quit: 06/01/2003    Years since quitting: 14.2  . Smokeless tobacco: Never Used  Substance Use Topics  . Alcohol use: No    Frequency: Never  . Drug use: No     Allergies   Codeine   Review of Systems Review of Systems  Respiratory: Negative for shortness of breath.   Cardiovascular: Negative for chest pain.  Gastrointestinal: Negative for abdominal pain.  Genitourinary: Positive for difficulty urinating.  Neurological: Negative for headaches.  All other systems reviewed and are negative.    Physical Exam Updated Vital Signs BP (!) 162/92 (BP Location: Left Arm)   Pulse 75   Temp 97.7 F (36.5 C) (Oral)   Resp 16   SpO2 100%   Physical Exam  Constitutional: He is oriented to person, place, and time. He appears well-developed and well-nourished. No distress.  HENT:  Head: Normocephalic and atraumatic.  Mouth/Throat: No oropharyngeal exudate.  Eyes: Conjunctivae and EOM are normal.  Neck: Normal range of motion. Neck supple.  Cardiovascular: Normal rate, regular rhythm, normal heart sounds and intact distal pulses.  Pulmonary/Chest: Effort normal and breath sounds normal. No stridor.  Abdominal: Soft. Bowel sounds are normal. He exhibits distension. He exhibits no mass. There is no  tenderness. There is no guarding.  Musculoskeletal: Normal range of motion.  Neurological: He is alert and oriented to person, place, and time.  Skin: Skin is warm and dry. Capillary refill takes less than 2 seconds.  Psychiatric: He has a normal mood and affect.     ED Treatments / Results  Labs (all labs ordered are listed, but only abnormal results are displayed)  Results for orders placed or performed during the hospital encounter of 08/17/17  I-Stat Chem 8, ED  Result Value Ref Range   Sodium 141 135 - 145 mmol/L   Potassium 3.7 3.5 - 5.1 mmol/L   Chloride 105 101 - 111 mmol/L   BUN 20 6 - 20 mg/dL   Creatinine, Ser 0.80 0.61 - 1.24 mg/dL   Glucose, Bld 143 (H) 65 - 99 mg/dL   Calcium, Ion 1.14 (L) 1.15 - 1.40 mmol/L   TCO2 23 22 - 32  mmol/L   Hemoglobin 13.9 13.0 - 17.0 g/dL   HCT 41.0 39.0 - 52.0 %   No results found.  Radiology No results found.  Procedures Procedures (including critical care time)  Medications Ordered in ED Medications  lidocaine (XYLOCAINE) 2 % jelly 1 application (1 application Urethral Given 08/17/17 0211)      Final Clinical Impressions(s) / ED Diagnoses   Follow up with urology as previously directed.   Return for weakness, numbness, changes in vision or speech, fevers >100.4 unrelieved by medication, shortness of breath, intractable vomiting, or diarrhea, abdominal pain, Inability to tolerate liquids or food, cough, altered mental status or any concerns. No signs of systemic illness or infection. The patient is nontoxic-appearing on exam and vital signs are within normal limits.   I have reviewed the triage vital signs and the nursing notes. Pertinent labs &imaging results that were available during my care of the patient were reviewed by me and considered in my medical decision making (see chart for details).  After history, exam, and medical workup I feel the patient has been appropriately medically screened and is safe for  discharge home. Pertinent diagnoses were discussed with the patient. Patient was given return precautions.   Marialy Urbanczyk, MD 08/17/17 [phone]

## 2017-08-17 NOTE — ED Notes (Signed)
Foley catheter removed  Pt voided large amount as soon as his catheter was removed  New catheter replaced  Tolerated well

## 2017-08-17 NOTE — ED Triage Notes (Signed)
Pt states he was seen on Monday evening for urinary retention and had a catheter placed  Pt states he has not had any urinary output since around 5pm  Pt is c/o pain and pressure

## 2017-11-30 ENCOUNTER — Other Ambulatory Visit: Payer: Self-pay | Admitting: Internal Medicine

## 2017-11-30 ENCOUNTER — Ambulatory Visit
Admission: RE | Admit: 2017-11-30 | Discharge: 2017-11-30 | Disposition: A | Discharge status: Home / Self Care | Payer: Medicare Other | Source: Ambulatory Visit | Attending: Internal Medicine | Admitting: Internal Medicine

## 2017-11-30 DIAGNOSIS — R059 Cough, unspecified: Secondary | ICD-10-CM

## 2017-11-30 DIAGNOSIS — R05 Cough: Secondary | ICD-10-CM

## 2018-01-29 ENCOUNTER — Encounter (HOSPITAL_COMMUNITY): Payer: Self-pay | Admitting: Emergency Medicine

## 2018-01-29 ENCOUNTER — Other Ambulatory Visit: Payer: Self-pay

## 2018-01-29 ENCOUNTER — Emergency Department (HOSPITAL_COMMUNITY): Payer: Medicare Other

## 2018-01-29 ENCOUNTER — Emergency Department (HOSPITAL_COMMUNITY)
Admission: EM | Admit: 2018-01-29 | Discharge: 2018-01-29 | Disposition: A | Discharge status: Home / Self Care | Payer: Medicare Other | Attending: Emergency Medicine | Admitting: Emergency Medicine

## 2018-01-29 DIAGNOSIS — N390 Urinary tract infection, site not specified: Secondary | ICD-10-CM | POA: Diagnosis not present

## 2018-01-29 DIAGNOSIS — Z87891 Personal history of nicotine dependence: Secondary | ICD-10-CM | POA: Insufficient documentation

## 2018-01-29 DIAGNOSIS — M797 Fibromyalgia: Secondary | ICD-10-CM | POA: Diagnosis not present

## 2018-01-29 DIAGNOSIS — E039 Hypothyroidism, unspecified: Secondary | ICD-10-CM | POA: Diagnosis not present

## 2018-01-29 DIAGNOSIS — R1030 Lower abdominal pain, unspecified: Secondary | ICD-10-CM | POA: Diagnosis present

## 2018-01-29 DIAGNOSIS — Z8551 Personal history of malignant neoplasm of bladder: Secondary | ICD-10-CM | POA: Insufficient documentation

## 2018-01-29 DIAGNOSIS — Z79899 Other long term (current) drug therapy: Secondary | ICD-10-CM | POA: Insufficient documentation

## 2018-01-29 DIAGNOSIS — Z7982 Long term (current) use of aspirin: Secondary | ICD-10-CM | POA: Insufficient documentation

## 2018-01-29 DIAGNOSIS — I2510 Atherosclerotic heart disease of native coronary artery without angina pectoris: Secondary | ICD-10-CM | POA: Insufficient documentation

## 2018-01-29 LAB — CBC WITH DIFFERENTIAL/PLATELET
Abs Immature Granulocytes: 0 10*3/uL (ref 0.0–0.1)
BASOS ABS: 0 10*3/uL (ref 0.0–0.1)
Basophils Relative: 0 %
EOS PCT: 1 %
Eosinophils Absolute: 0.1 10*3/uL (ref 0.0–0.7)
HEMATOCRIT: 48.6 % (ref 39.0–52.0)
HEMOGLOBIN: 15.9 g/dL (ref 13.0–17.0)
Immature Granulocytes: 0 %
LYMPHS ABS: 0.4 10*3/uL — AB (ref 0.7–4.0)
LYMPHS PCT: 5 %
MCH: 29.8 pg (ref 26.0–34.0)
MCHC: 32.7 g/dL (ref 30.0–36.0)
MCV: 91 fL (ref 78.0–100.0)
MONO ABS: 0.4 10*3/uL (ref 0.1–1.0)
MONOS PCT: 6 %
Neutro Abs: 5.9 10*3/uL (ref 1.7–7.7)
Neutrophils Relative %: 88 %
Platelets: 183 10*3/uL (ref 150–400)
RBC: 5.34 MIL/uL (ref 4.22–5.81)
RDW: 13 % (ref 11.5–15.5)
WBC: 6.8 10*3/uL (ref 4.0–10.5)

## 2018-01-29 LAB — BASIC METABOLIC PANEL
Anion gap: 9 (ref 5–15)
BUN: 16 mg/dL (ref 8–23)
CHLORIDE: 104 mmol/L (ref 98–111)
CO2: 24 mmol/L (ref 22–32)
Calcium: 9.1 mg/dL (ref 8.9–10.3)
Creatinine, Ser: 1.17 mg/dL (ref 0.61–1.24)
GFR calc Af Amer: >60 mL/min (ref >60)
GFR calc non Af Amer: >60 mL/min (ref >60)
Glucose, Bld: 94 mg/dL (ref 70–99)
POTASSIUM: 4.2 mmol/L (ref 3.5–5.1)
Sodium: 137 mmol/L (ref 135–145)

## 2018-01-29 LAB — URINALYSIS, ROUTINE W REFLEX MICROSCOPIC
Bacteria, UA: NONE SEEN
Bilirubin Urine: NEGATIVE
Glucose, UA: NEGATIVE mg/dL
Ketones, ur: NEGATIVE mg/dL
Nitrite: NEGATIVE
PROTEIN: 100 mg/dL — AB
Specific Gravity, Urine: 1.021 (ref 1.005–1.030)
WBC, UA: >50 WBC/hpf — ABNORMAL HIGH (ref 0–5)
pH: 6 (ref 5.0–8.0)

## 2018-01-29 LAB — I-STAT CG4 LACTIC ACID, ED: Lactic Acid, Venous: 1.35 mmol/L (ref 0.5–1.9)

## 2018-01-29 MED ORDER — SODIUM CHLORIDE 0.9 % IV BOLUS
1000.0000 mL | Freq: Once | INTRAVENOUS | Status: AC
Start: 2018-01-29 — End: 2018-01-29
  Administered 2018-01-29: 1000 mL via INTRAVENOUS

## 2018-01-29 MED ORDER — SODIUM CHLORIDE 0.9 % IV SOLN
2.0000 g | Freq: Once | INTRAVENOUS | Status: AC
  Administered 2018-01-29: 2 g via INTRAVENOUS
  Filled 2018-01-29: qty 20

## 2018-01-29 MED ORDER — KETOROLAC TROMETHAMINE 30 MG/ML IJ SOLN
15.0000 mg | Freq: Once | INTRAMUSCULAR | Status: AC
  Administered 2018-01-29: 15 mg via INTRAVENOUS
  Filled 2018-01-29: qty 1

## 2018-01-29 MED ORDER — PHENAZOPYRIDINE HCL 200 MG PO TABS: 200.0000 mg | ORAL_TABLET | Freq: Three times a day (TID) | ORAL | 0 refills | Status: DC

## 2018-01-29 MED ORDER — CEFPODOXIME PROXETIL 100 MG PO TABS: 100.0000 mg | ORAL_TABLET | Freq: Two times a day (BID) | ORAL | 0 refills | Status: AC

## 2018-01-29 NOTE — ED Notes (Signed)
Patient verbalizes understanding of discharge instructions. Opportunity for questioning and answers were provided. Armband removed by staff, pt discharged from ED.  

## 2018-01-29 NOTE — Discharge Instructions (Signed)
Follow-up with your urologist and primary care provider. Take antibiotics as directed for the next 10 days. Return to ED for worsening symptoms, fever, severe chest or abdominal pain, lightheadedness or loss of consciousness.

## 2018-01-29 NOTE — ED Triage Notes (Signed)
Pt arrives c.o. Hypotension, however pt is normotensive at triage. Pt states he has had over 1 week of bilateral flank pain with painful urination. Pt has visible chills at triage. Afebrile with oral temp.

## 2018-01-29 NOTE — ED Provider Notes (Signed)
[facility] EMERGENCY DEPARTMENT Provider Note   CSN: [id number] Arrival date & time: 01/29/18  1709     History   Chief Complaint Chief Complaint  Patient presents with  . Flank Pain  . Urinary Tract Infection    HPI Marvin Gill is a 67 y.o. male with a past medical history of bladder cancer, BPH, fibromyalgia, CAD, who presents to ED for evaluation of 10-day history of lower abdominal pain, bilateral flank pain, dysuria and hematuria.  Patient seen and evaluated by his PCP when symptoms began.  He was told that he had "a little bit of blood in the urine and may be a kidney infection."  He was put on a 2-week course of Bactrim.  He has been taking this as directed but states that the symptoms have not improved and have actually worsened.  He reports dysuria, generalized body aches and chills/shivering.  He has taken NSAIDs with only mild improvement in his pain.  He reports nausea but denies any vomiting.  Had 3 episodes of diarrhea yesterday but denies any other bowel changes.  He reports history of kidney stones but states that this feels different.  Denies any shortness of breath, chest pain, changes to gait, blood thinner use.  HPI  Past Medical History:  Diagnosis Date  . Aneurysm of abdominal aorta (HCC)    AAA PER PER 08-04-16 3.1 CM X 3.1 CM US AORTA SEES DR GRIFFIN FOR  . Anxiety   . Arthritis    back  . Benign localized prostatic hyperplasia with lower urinary tract symptoms (LUTS)   . Complication of anesthesia   . Coronary artery disease 2000   single vessel cad  . Fibromyalgia   . GERD (gastroesophageal reflux disease)   . History of skin cancer   . Hypothyroidism   . PONV (postoperative nausea and vomiting)    NAUSEA ALL DAY AFTER 02-01-17 SURGERY, PT WANTS ANESTHESIA LIKE 06-23-15 SURGERY WITH DR  [name]  . S/P percutaneous transluminal coronary angioplasty    05-18-1999  to dLAD    Patient Active Problem List   Diagnosis Date Noted  .  Benign prostate hyperplasia 04/01/2014    Past Surgical History:  Procedure Laterality Date  . CARDIAC CATHETERIZATION  06-14-2001  dr Einar Gip   normal coronary arteries and patent previous dLAD   . COLONOSCOPY WITH PROPOFOL N/A 12/13/2013   Procedure: COLONOSCOPY WITH PROPOFOL;  Surgeon: Garlan Fair, MD;  Location: [facility];  Service: Endoscopy;  Laterality: N/A;  . CORONARY ANGIOPLASTY  05-18-1999  dr Tami Ribas   PTCA balloon to dLAD 80% (single vessel disease),  ef 60%  . CYSTOSCOPY WITH FULGERATION N/A 08/15/2017   Procedure: CYSTOSCOP/ BLADDER BIOPSY/TURBT;  Surgeon: Nickie Retort, MD;  Location: [facility];  Service: Urology;  Laterality: N/A;  . CYSTOSCOPY WITH INSERTION OF UROLIFT N/A 06/23/2015   Procedure: CYSTOSCOPY WITH INSERTION OF UROLIFT;  Surgeon: Carolan Clines, MD;  Location: [facility];  Service: Urology;  Laterality: N/A;  . CYSTOSCOPY/URETEROSCOPY/HOLMIUM LASER/STENT PLACEMENT Bilateral 02/01/2017   Procedure: CYSTOSCOPY/URETEROSCOPY/HOLMIUM LASER/STENT PLACEMENT,STONE BASKETRY, BLADDER BIOPSY;  Surgeon: Nickie Retort, MD;  Location: [facility];  Service: Urology;  Laterality: Bilateral;  . ESOPHAGOGASTRODUODENOSCOPY  08-24-2006  . TRANSURETHRAL INCISION OF PROSTATE N/A 04/01/2014   Procedure: TRANSURETHRAL INCISION OF THE PROSTATE (TUIP);  Surgeon: Ailene Rud, MD;  Location: [facility];  Service: Urology;  Laterality: N/A;        Home Medications  Prior to Admission medications   Medication Sig Start Date End Date Taking? Authorizing Provider  ALPRAZolam Duanne Moron) 1 MG tablet Take 1 mg by mouth 2 (two) times daily.     [provider]  aspirin EC 81 MG tablet Take 81 mg by mouth daily.    [provider]  cefpodoxime (VANTIN) 100 MG tablet Take 1 tablet (100 mg total) by mouth 2 (two) times daily for 10 days. 01/29/18 02/08/18  Kyrian Stage, PA-C  cephALEXin (KEFLEX) 500 MG  capsule Take 1 capsule (500 mg total) by mouth 3 (three) times daily. 08/15/17   Nickie Retort, MD  esomeprazole (NEXIUM) 40 MG capsule Take 40 mg by mouth every morning.     [provider]  gabapentin (NEURONTIN) 600 MG tablet Take 600 mg by mouth 3 (three) times daily.  05/11/15   [provider]  HYDROcodone-acetaminophen (NORCO) 5-325 MG tablet Take 1 tablet by mouth every 4 (four) hours as needed for moderate pain. 08/15/17   Nickie Retort, MD  levothyroxine (SYNTHROID, LEVOTHROID) 25 MCG tablet Take 25 mcg by mouth daily before breakfast.    [provider]  phenazopyridine (PYRIDIUM) 200 MG tablet Take 1 tablet (200 mg total) by mouth 3 (three) times daily. 01/29/18   Anquan Azzarello, PA-C  pravastatin (PRAVACHOL) 40 MG tablet Take 40 mg by mouth daily.  05/07/15   [provider]  tamsulosin (FLOMAX) 0.4 MG CAPS capsule Take 0.4 mg by mouth daily.  05/15/15   [provider]  valACYclovir (VALTREX) 1000 MG tablet Take 1,000 mg by mouth 2 (two) times daily. X 7 DAYS, THEN 500 MG BID X 6 MONTHS    [provider]    Family History Family History  Problem Relation Age of Onset  . Anuerysm Mother        Brain    Social History Social History   Tobacco Use  . Smoking status: Former Smoker    Packs/day: 1.00    Years: 25.00    Pack years: 25.00    Types: Cigarettes    Last attempt to quit: 06/01/2003    Years since quitting: 14.6  . Smokeless tobacco: Never Used  Substance Use Topics  . Alcohol use: No    Frequency: Never  . Drug use: No     Allergies   Codeine   Review of Systems Review of Systems  Constitutional: Negative for appetite change, chills and fever.  HENT: Negative for ear pain, rhinorrhea, sneezing and sore throat.   Eyes: Negative for photophobia and visual disturbance.  Respiratory: Negative for cough, chest tightness, shortness of breath and wheezing.   Cardiovascular: Negative for chest pain  and palpitations.  Gastrointestinal: Positive for nausea. Negative for abdominal pain, blood in stool, constipation, diarrhea and vomiting.  Genitourinary: Positive for dysuria, flank pain and hematuria. Negative for urgency.  Musculoskeletal: Negative for myalgias.  Skin: Negative for rash.  Neurological: Negative for dizziness, weakness and light-headedness.     Physical Exam Updated Vital Signs BP 114/77   Pulse 86   Temp 98.6 F (37 C)   Resp (!) 22   Ht 6\' 6"  (1.981 m)   Wt 93 kg   SpO2 97%   BMI 23.69 kg/m   Physical Exam  Constitutional: He appears well-developed and well-nourished. No distress.  HENT:  Head: Normocephalic and atraumatic.  Nose: Nose normal.  Eyes: Conjunctivae and EOM are normal. Right eye exhibits no discharge. Left eye exhibits no discharge. No scleral icterus.  Neck: Normal range of motion. Neck supple.  Cardiovascular: Normal rate, regular rhythm, normal heart sounds and intact distal pulses. Exam reveals no gallop and no friction rub.  No murmur heard. Pulmonary/Chest: Effort normal and breath sounds normal. No respiratory distress.  Abdominal: Soft. Bowel sounds are normal. He exhibits no distension. There is tenderness. There is no rebound and no guarding.  Suprapubic, bilateral CVA tenderness.  Musculoskeletal: Normal range of motion. He exhibits no edema.  Neurological: He is alert. He exhibits normal muscle tone. Coordination normal.  Skin: Skin is warm and dry. No rash noted.  Psychiatric: He has a normal mood and affect.  Nursing note and vitals reviewed.    ED Treatments / Results  Labs (all labs ordered are listed, but only abnormal results are displayed) Labs Reviewed  URINALYSIS, ROUTINE W REFLEX MICROSCOPIC - Abnormal; Notable for the following components:      Result Value   Color, Urine AMBER (*)    APPearance CLOUDY (*)    Hgb urine dipstick LARGE (*)    Protein, ur 100 (*)    Leukocytes, UA TRACE (*)    RBC / HPF >50  (*)    WBC, UA >50 (*)    All other components within normal limits  CBC WITH DIFFERENTIAL/PLATELET - Abnormal; Notable for the following components:   Lymphs Abs 0.4 (*)    All other components within normal limits  URINE CULTURE  CULTURE, BLOOD (ROUTINE X 2)  CULTURE, BLOOD (ROUTINE X 2)  BASIC METABOLIC PANEL  I-STAT CG4 LACTIC ACID, ED    EKG None  Radiology Ct Renal Stone Study  Result Date: 01/29/2018 CLINICAL DATA:  67 year old male with back pain and gross hematuria. History of kidney stones. EXAM: CT ABDOMEN AND PELVIS WITHOUT CONTRAST TECHNIQUE: Multidetector CT imaging of the abdomen and pelvis was performed following the standard protocol without IV contrast. COMPARISON:  CT of the abdomen and pelvis 07/22/2017. FINDINGS: Lower chest: 4 mm right lower lobe pulmonary nodule (axial image 10 of series 5). 3 mm left lower lobe nodule (axial image 2 of series 5). Both of these are unchanged on prior examinations dating back to 12/02/2016, considered definitively benign. Hepatobiliary: Mild diffuse low attenuation throughout the hepatic parenchyma, indicative of hepatic steatosis. No definite cystic or solid hepatic lesions are confidently identified on today's noncontrast CT examination. Unenhanced appearance of the gallbladder is normal. Pancreas: No definite pancreatic mass or peripancreatic fluid or inflammatory changes are noted on today's noncontrast CT examination. Spleen: Unremarkable. Adrenals/Urinary Tract: There are multiple nonobstructive calculi within the collecting systems of both kidneys, largest of which is in the lower pole collecting system of the right kidney measuring 11 mm. Patient no additional calculi are noted along the course of either ureter. However, there is mild left hydroureteronephrosis which extends all the way to the level of the left ureterovesicular junction. No calculi are noted within the lumen of the urinary bladder. Urinary bladder is otherwise  unremarkable in appearance. No definite renal lesions. Bilateral adrenal glands are normal in appearance. Stomach/Bowel: Normal appearance of the stomach. No pathologic dilatation of small bowel or colon. Normal appendix. Vascular/Lymphatic: Aortic atherosclerosis. No lymphadenopathy noted in the abdomen or pelvis. Reproductive: Small fiducial markers or brachytherapy implants are noted in the prostate gland. Seminal vesicles are unremarkable in appearance. Other: No significant volume of ascites.  No pneumoperitoneum. Musculoskeletal: There are no aggressive appearing lytic or blastic lesions noted in the visualized portions of the skeleton. IMPRESSION: 1. Multiple nonobstructive calculi are  noted within the collecting systems of both kidneys measuring up to 11 mm in the lower pole collecting system of the right kidney. 2. Mild left-sided hydroureteronephrosis which extends all the way to the level of the ureterovesicular junction. No definite left ureteral stone is identified. This could suggest a left UVJ stricture, potentially related to prior passage of a stone. Alternatively, a non radiopaque calculus could be present at the left ureterovesicular junction. 3. Mild hepatic steatosis. 4. Aortic atherosclerosis. Electronically Signed   By: Vinnie Langton M.D.   On: 01/29/2018 20:58    Procedures Procedures (including critical care time)  Medications Ordered in ED Medications  sodium chloride 0.9 % bolus 1,000 mL (0 mLs Intravenous Stopped 01/29/18 2122)  cefTRIAXone (ROCEPHIN) 2 g in sodium chloride 0.9 % 100 mL IVPB (0 g Intravenous Stopped 01/29/18 2015)  ketorolac (TORADOL) 30 MG/ML injection 15 mg (15 mg Intravenous Given 01/29/18 2158)     Initial Impression / Assessment and Plan / ED Course  I have reviewed the triage vital signs and the nursing notes.  Pertinent labs & imaging results that were available during my care of the patient were reviewed by me and considered in my medical decision  making (see chart for details).     67 year old male with past medical history of bladder cancer, BPH, fibromyalgia presents to ED for evaluation of 10-day history of lower abdominal pain, bilateral flank pain, dysuria and hematuria.  Patient seen and evaluated by PCP when symptoms began and was put on a 2-week course of Bactrim for supposed did pyelonephritis.  Has been taking antibiotics as directed but continues to have pain.  On exam there is suprapubic tenderness to palpation bilateral CVA tenderness.  He is afebrile.  Other vital signs remained within normal limits.  Urinalysis with WBCs, otherwise negative for infection.  Show hematuria.  CBC, BMP unremarkable.  I-STAT lactic acid normal.  CT renal stone study shows several nonobstructing stones and left-sided hydronephrosis.  No apparent ureteral stone noted.  I spoke to Dr. Tresa Moore of urology who recommends no emergent treatment at this time.  Will give 1 dose of Rocephin here, discharged home with Vantin.  Pain controlled here in the ED.  Patient remains in NAD.  Will advise him to return to ED for any severe worsening symptoms and to follow-up with urology.  Portions of this note were generated with [employer]. Dictation errors may occur despite best attempts at proofreading.   Final Clinical Impressions(s) / ED Diagnoses   Final diagnoses:  Lower urinary tract infectious disease    ED Discharge Orders         Ordered    cefpodoxime (VANTIN) 100 MG tablet  2 times daily     01/29/18 2200    phenazopyridine (PYRIDIUM) 200 MG tablet  3 times daily     01/29/18 2200           Delia Heady, PA-C 01/29/18 2205    Duffy Bruce, MD 01/30/18 [phone]

## 2018-01-31 LAB — URINE CULTURE: CULTURE: NO GROWTH

## 2018-02-03 LAB — CULTURE, BLOOD (ROUTINE X 2)
Culture: NO GROWTH
Culture: NO GROWTH
SPECIAL REQUESTS: ADEQUATE
Special Requests: ADEQUATE

## 2018-03-03 ENCOUNTER — Other Ambulatory Visit: Payer: Self-pay | Admitting: Ophthalmology

## 2018-03-05 ENCOUNTER — Emergency Department (HOSPITAL_COMMUNITY): Payer: Medicare Other

## 2018-03-05 ENCOUNTER — Inpatient Hospital Stay (HOSPITAL_COMMUNITY)
Admission: EM | Admit: 2018-03-05 | Discharge: 2018-03-09 | DRG: 872 | Disposition: A | Discharge status: Home / Self Care | Payer: Medicare Other | Attending: Internal Medicine | Admitting: Internal Medicine

## 2018-03-05 ENCOUNTER — Encounter (HOSPITAL_COMMUNITY): Payer: Self-pay | Admitting: Emergency Medicine

## 2018-03-05 ENCOUNTER — Other Ambulatory Visit: Payer: Self-pay

## 2018-03-05 DIAGNOSIS — E440 Moderate protein-calorie malnutrition: Secondary | ICD-10-CM

## 2018-03-05 DIAGNOSIS — M797 Fibromyalgia: Secondary | ICD-10-CM | POA: Diagnosis present

## 2018-03-05 DIAGNOSIS — A419 Sepsis, unspecified organism: Principal | ICD-10-CM | POA: Diagnosis present

## 2018-03-05 DIAGNOSIS — Z79899 Other long term (current) drug therapy: Secondary | ICD-10-CM

## 2018-03-05 DIAGNOSIS — R6520 Severe sepsis without septic shock: Secondary | ICD-10-CM | POA: Diagnosis not present

## 2018-03-05 DIAGNOSIS — R531 Weakness: Secondary | ICD-10-CM

## 2018-03-05 DIAGNOSIS — N179 Acute kidney failure, unspecified: Secondary | ICD-10-CM | POA: Diagnosis present

## 2018-03-05 DIAGNOSIS — I480 Paroxysmal atrial fibrillation: Secondary | ICD-10-CM

## 2018-03-05 DIAGNOSIS — I2510 Atherosclerotic heart disease of native coronary artery without angina pectoris: Secondary | ICD-10-CM | POA: Diagnosis present

## 2018-03-05 DIAGNOSIS — N390 Urinary tract infection, site not specified: Secondary | ICD-10-CM | POA: Diagnosis present

## 2018-03-05 DIAGNOSIS — R52 Pain, unspecified: Secondary | ICD-10-CM

## 2018-03-05 DIAGNOSIS — N400 Enlarged prostate without lower urinary tract symptoms: Secondary | ICD-10-CM | POA: Diagnosis present

## 2018-03-05 DIAGNOSIS — I4892 Unspecified atrial flutter: Secondary | ICD-10-CM | POA: Diagnosis present

## 2018-03-05 DIAGNOSIS — G629 Polyneuropathy, unspecified: Secondary | ICD-10-CM | POA: Diagnosis present

## 2018-03-05 DIAGNOSIS — I951 Orthostatic hypotension: Secondary | ICD-10-CM

## 2018-03-05 DIAGNOSIS — F419 Anxiety disorder, unspecified: Secondary | ICD-10-CM | POA: Diagnosis present

## 2018-03-05 DIAGNOSIS — R509 Fever, unspecified: Secondary | ICD-10-CM

## 2018-03-05 DIAGNOSIS — Z9861 Coronary angioplasty status: Secondary | ICD-10-CM

## 2018-03-05 DIAGNOSIS — E785 Hyperlipidemia, unspecified: Secondary | ICD-10-CM | POA: Diagnosis present

## 2018-03-05 DIAGNOSIS — E039 Hypothyroidism, unspecified: Secondary | ICD-10-CM | POA: Diagnosis present

## 2018-03-05 DIAGNOSIS — Z87891 Personal history of nicotine dependence: Secondary | ICD-10-CM

## 2018-03-05 DIAGNOSIS — Z85828 Personal history of other malignant neoplasm of skin: Secondary | ICD-10-CM

## 2018-03-05 DIAGNOSIS — Z6823 Body mass index (BMI) 23.0-23.9, adult: Secondary | ICD-10-CM

## 2018-03-05 DIAGNOSIS — E872 Acidosis: Secondary | ICD-10-CM | POA: Diagnosis present

## 2018-03-05 DIAGNOSIS — Z79890 Hormone replacement therapy (postmenopausal): Secondary | ICD-10-CM

## 2018-03-05 DIAGNOSIS — Z23 Encounter for immunization: Secondary | ICD-10-CM

## 2018-03-05 DIAGNOSIS — K219 Gastro-esophageal reflux disease without esophagitis: Secondary | ICD-10-CM | POA: Diagnosis present

## 2018-03-05 DIAGNOSIS — Z7982 Long term (current) use of aspirin: Secondary | ICD-10-CM

## 2018-03-05 DIAGNOSIS — E876 Hypokalemia: Secondary | ICD-10-CM | POA: Diagnosis present

## 2018-03-05 HISTORY — DX: Paroxysmal atrial fibrillation: I48.0

## 2018-03-05 LAB — COMPREHENSIVE METABOLIC PANEL
ALBUMIN: 3.7 g/dL (ref 3.5–5.0)
ALT: 22 U/L (ref 0–44)
AST: 25 U/L (ref 15–41)
Alkaline Phosphatase: 64 U/L (ref 38–126)
Anion gap: 12 (ref 5–15)
BUN: 35 mg/dL — ABNORMAL HIGH (ref 8–23)
CHLORIDE: 105 mmol/L (ref 98–111)
CO2: 23 mmol/L (ref 22–32)
CREATININE: 1.63 mg/dL — AB (ref 0.61–1.24)
Calcium: 8.7 mg/dL — ABNORMAL LOW (ref 8.9–10.3)
GFR calc Af Amer: 48 mL/min — ABNORMAL LOW (ref >60)
GFR, EST NON AFRICAN AMERICAN: 42 mL/min — AB (ref >60)
GLUCOSE: 117 mg/dL — AB (ref 70–99)
POTASSIUM: 3.2 mmol/L — AB (ref 3.5–5.1)
SODIUM: 140 mmol/L (ref 135–145)
Total Bilirubin: 0.9 mg/dL (ref 0.3–1.2)
Total Protein: 7.1 g/dL (ref 6.5–8.1)

## 2018-03-05 LAB — URINALYSIS, ROUTINE W REFLEX MICROSCOPIC
Bacteria, UA: NONE SEEN
Bilirubin Urine: NEGATIVE
GLUCOSE, UA: NEGATIVE mg/dL
Ketones, ur: NEGATIVE mg/dL
Nitrite: NEGATIVE
Protein, ur: 100 mg/dL — AB
RBC / HPF: >50 RBC/hpf — ABNORMAL HIGH (ref 0–5)
SPECIFIC GRAVITY, URINE: 1.019 (ref 1.005–1.030)
pH: 6 (ref 5.0–8.0)

## 2018-03-05 LAB — CBC WITH DIFFERENTIAL/PLATELET
Basophils Absolute: 0 10*3/uL (ref 0.0–0.1)
Basophils Relative: 0 %
EOS ABS: 0.6 10*3/uL (ref 0.0–0.7)
EOS PCT: 4 %
HCT: 45.6 % (ref 39.0–52.0)
Hemoglobin: 15.6 g/dL (ref 13.0–17.0)
LYMPHS PCT: 5 %
Lymphs Abs: 0.6 10*3/uL — ABNORMAL LOW (ref 0.7–4.0)
MCH: 30.8 pg (ref 26.0–34.0)
MCHC: 34.2 g/dL (ref 30.0–36.0)
MCV: 90.1 fL (ref 78.0–100.0)
MONO ABS: 0.5 10*3/uL (ref 0.1–1.0)
MONOS PCT: 4 %
Neutro Abs: 11.7 10*3/uL — ABNORMAL HIGH (ref 1.7–7.7)
Neutrophils Relative %: 87 %
PLATELETS: 209 10*3/uL (ref 150–400)
RBC: 5.06 MIL/uL (ref 4.22–5.81)
RDW: 14.4 % (ref 11.5–15.5)
WBC: 13.3 10*3/uL — AB (ref 4.0–10.5)

## 2018-03-05 LAB — I-STAT CG4 LACTIC ACID, ED
LACTIC ACID, VENOUS: 3.44 mmol/L — AB (ref 0.5–1.9)
LACTIC ACID, VENOUS: 1.35 mmol/L (ref 0.5–1.9)

## 2018-03-05 MED ORDER — METRONIDAZOLE IN NACL 5-0.79 MG/ML-% IV SOLN
500.0000 mg | Freq: Three times a day (TID) | INTRAVENOUS | Status: DC
  Administered 2018-03-05 – 2018-03-06 (×3): 500 mg via INTRAVENOUS
  Filled 2018-03-05 (×3): qty 100

## 2018-03-05 MED ORDER — INFLUENZA VAC SPLIT HIGH-DOSE 0.5 ML IM SUSY
0.5000 mL | PREFILLED_SYRINGE | INTRAMUSCULAR | Status: AC
  Administered 2018-03-06: 0.5 mL via INTRAMUSCULAR
  Filled 2018-03-05: qty 0.5

## 2018-03-05 MED ORDER — SENNOSIDES-DOCUSATE SODIUM 8.6-50 MG PO TABS: 1.0000 | ORAL_TABLET | Freq: Two times a day (BID) | ORAL | Status: DC | PRN

## 2018-03-05 MED ORDER — ACETAMINOPHEN 500 MG PO TABS
1000.0000 mg | ORAL_TABLET | Freq: Once | ORAL | Status: AC
  Administered 2018-03-05: 1000 mg via ORAL
  Filled 2018-03-05: qty 2

## 2018-03-05 MED ORDER — PANTOPRAZOLE SODIUM 20 MG PO TBEC
20.0000 mg | DELAYED_RELEASE_TABLET | Freq: Every day | ORAL | Status: DC
  Administered 2018-03-06 – 2018-03-09 (×4): 20 mg via ORAL
  Filled 2018-03-05 (×5): qty 1

## 2018-03-05 MED ORDER — SODIUM CHLORIDE 0.9 % IV SOLN
1.0000 g | Freq: Two times a day (BID) | INTRAVENOUS | Status: DC
  Administered 2018-03-06: 1 g via INTRAVENOUS
  Filled 2018-03-05: qty 1

## 2018-03-05 MED ORDER — GABAPENTIN 300 MG PO CAPS
600.0000 mg | ORAL_CAPSULE | Freq: Three times a day (TID) | ORAL | Status: DC
  Administered 2018-03-05 – 2018-03-09 (×12): 600 mg via ORAL
  Filled 2018-03-05 (×12): qty 2

## 2018-03-05 MED ORDER — ALPRAZOLAM 1 MG PO TABS
1.0000 mg | ORAL_TABLET | Freq: Two times a day (BID) | ORAL | Status: DC | PRN
  Administered 2018-03-05 – 2018-03-09 (×8): 1 mg via ORAL
  Filled 2018-03-05: qty 2
  Filled 2018-03-05 (×7): qty 1

## 2018-03-05 MED ORDER — LEVOTHYROXINE SODIUM 25 MCG PO TABS
25.0000 ug | ORAL_TABLET | Freq: Every day | ORAL | Status: DC
  Administered 2018-03-06 – 2018-03-09 (×4): 25 ug via ORAL
  Filled 2018-03-05 (×4): qty 1

## 2018-03-05 MED ORDER — PRAVASTATIN SODIUM 40 MG PO TABS
40.0000 mg | ORAL_TABLET | Freq: Every day | ORAL | Status: DC
  Administered 2018-03-06 – 2018-03-09 (×4): 40 mg via ORAL
  Filled 2018-03-05: qty 2
  Filled 2018-03-05 (×2): qty 1
  Filled 2018-03-05: qty 2

## 2018-03-05 MED ORDER — SODIUM CHLORIDE 0.9 % IV SOLN
INTRAVENOUS | Status: DC | PRN
  Administered 2018-03-05: 250 mL via INTRAVENOUS

## 2018-03-05 MED ORDER — POTASSIUM CHLORIDE CRYS ER 20 MEQ PO TBCR
40.0000 meq | EXTENDED_RELEASE_TABLET | Freq: Once | ORAL | Status: AC
  Administered 2018-03-05: 40 meq via ORAL
  Filled 2018-03-05: qty 2

## 2018-03-05 MED ORDER — ENOXAPARIN SODIUM 40 MG/0.4ML ~~LOC~~ SOLN
40.0000 mg | SUBCUTANEOUS | Status: DC
  Administered 2018-03-06: 40 mg via SUBCUTANEOUS
  Filled 2018-03-05: qty 0.4

## 2018-03-05 MED ORDER — HYDROCODONE-ACETAMINOPHEN 5-325 MG PO TABS
1.0000 | ORAL_TABLET | ORAL | Status: DC | PRN
  Administered 2018-03-05 – 2018-03-06 (×3): 2 via ORAL
  Administered 2018-03-09: 1 via ORAL
  Filled 2018-03-05 (×4): qty 2
  Filled 2018-03-05: qty 1

## 2018-03-05 MED ORDER — LACTATED RINGERS IV BOLUS
1000.0000 mL | Freq: Once | INTRAVENOUS | Status: AC
  Administered 2018-03-05: 1000 mL via INTRAVENOUS

## 2018-03-05 MED ORDER — SODIUM CHLORIDE 0.9 % IV SOLN
2.0000 g | Freq: Once | INTRAVENOUS | Status: AC
  Administered 2018-03-05: 2 g via INTRAVENOUS
  Filled 2018-03-05: qty 2

## 2018-03-05 MED ORDER — ENSURE ENLIVE PO LIQD
237.0000 mL | Freq: Two times a day (BID) | ORAL | Status: DC
  Administered 2018-03-06 – 2018-03-09 (×3): 237 mL via ORAL

## 2018-03-05 MED ORDER — VANCOMYCIN HCL IN DEXTROSE 1-5 GM/200ML-% IV SOLN
1000.0000 mg | Freq: Once | INTRAVENOUS | Status: AC
  Administered 2018-03-05: 1000 mg via INTRAVENOUS
  Filled 2018-03-05: qty 200

## 2018-03-05 NOTE — ED Notes (Signed)
Daughter Deidre Ala # [phone]

## 2018-03-05 NOTE — ED Notes (Signed)
ED TO INPATIENT HANDOFF REPORT  Name/Age/Gender Marvin Gill 67 y.o. male  Code Status    Code Status Orders  (From admission, onward)         Start     Ordered   03/05/18 1838  Full code  Continuous     03/05/18 1839        Code Status History    Date Active Date Inactive Code Status Order ID Comments User Context   04/01/2014 1012 04/02/2014 1257 Full Code [id number]  Ailene Rud, MD Inpatient    Advance Directive Documentation     Most Recent Value  Type of Advance Directive  Healthcare Power of Attorney, Living will  Pre-existing out of facility DNR order (yellow form or pink MOST form)  -  "MOST" Form in Place?  -      Home/SNF/Other Home  Chief Complaint body pain;dizziness-post surgical  Level of Care/Admitting Diagnosis ED Disposition    ED Disposition Condition [place]: [facility] [[id number]]  Level of Care: Med-Surg [16]  Diagnosis: Sepsis [facility]) [[id number]]  Admitting Physician: Colbert Ewing [[id number]]  Attending Physician: Colbert Ewing [[id number]]  PT Class (Do Not Modify): Observation [104]  PT Acc Code (Do Not Modify): Observation [10022]       Medical History Past Medical History:  Diagnosis Date  . Aneurysm of abdominal aorta (HCC)    AAA PER PER 08-04-16 3.1 CM X 3.1 CM US AORTA SEES DR GRIFFIN FOR  . Anxiety   . Arthritis    back  . Benign localized prostatic hyperplasia with lower urinary tract symptoms (LUTS)   . Complication of anesthesia   . Coronary artery disease 2000   single vessel cad  . Fibromyalgia   . GERD (gastroesophageal reflux disease)   . History of skin cancer   . Hypothyroidism   . PONV (postoperative nausea and vomiting)    NAUSEA ALL DAY AFTER 02-01-17 SURGERY, PT WANTS ANESTHESIA LIKE 06-23-15 SURGERY WITH DR  [name]  . S/P percutaneous transluminal coronary angioplasty    05-18-1999  to dLAD    Allergies Allergies  Allergen Reactions  . Codeine  Nausea And Vomiting    "made my head feel funny too"    IV Location/Drains/Wounds Patient Lines/Drains/Airways Status   Active Line/Drains/Airways    Name:   Placement date:   Placement time:   Site:   Days:   Peripheral IV 03/05/18 Left Forearm   03/05/18    1425    Forearm   less than 1   Peripheral IV 03/05/18 Right Antecubital   03/05/18    1516    Antecubital   less than 1   Incision (Closed) 04/01/14 Perineum Other (Comment)   04/01/14    0921     1434   Incision (Closed) 02/01/17 Penis   02/01/17    1307     397   Incision (Closed) 08/15/17 Penis Other (Comment)   08/15/17    1204     202          Labs/Imaging Results for orders placed or performed during the hospital encounter of 03/05/18 (from the past 48 hour(s))  Comprehensive metabolic panel     Status: Abnormal   Collection Time: 03/05/18  1:43 PM  Result Value Ref Range   Sodium 140 135 - 145 mmol/L   Potassium 3.2 (L) 3.5 - 5.1 mmol/L   Chloride 105 98 - 111 mmol/L   CO2 23  22 - 32 mmol/L   Glucose, Bld 117 (H) 70 - 99 mg/dL   BUN 35 (H) 8 - 23 mg/dL   Creatinine, Ser 1.63 (H) 0.61 - 1.24 mg/dL   Calcium 8.7 (L) 8.9 - 10.3 mg/dL   Total Protein 7.1 6.5 - 8.1 g/dL   Albumin 3.7 3.5 - 5.0 g/dL   AST 25 15 - 41 U/L   ALT 22 0 - 44 U/L   Alkaline Phosphatase 64 38 - 126 U/L   Total Bilirubin 0.9 0.3 - 1.2 mg/dL   GFR calc non Af Amer 42 (L) >60 mL/min   GFR calc Af Amer 48 (L) >60 mL/min    Comment: (NOTE) The eGFR has been calculated using the CKD EPI equation. This calculation has not been validated in all clinical situations. eGFR's persistently <60 mL/min signify possible Chronic Kidney Disease.    Anion gap 12 5 - 15    Comment: Performed at [facility], [place] [address], [place], [place] [address]  CBC WITH DIFFERENTIAL     Status: Abnormal   Collection Time: 03/05/18  1:43 PM  Result Value Ref Range   WBC 13.3 (H) 4.0 - 10.5 K/uL   RBC 5.06 4.22 - 5.81 MIL/uL   Hemoglobin 15.6  13.0 - 17.0 g/dL   HCT 45.6 39.0 - 52.0 %   MCV 90.1 78.0 - 100.0 fL   MCH 30.8 26.0 - 34.0 pg   MCHC 34.2 30.0 - 36.0 g/dL   RDW 14.4 11.5 - 15.5 %   Platelets 209 150 - 400 K/uL   Neutrophils Relative % 87 %   Neutro Abs 11.7 (H) 1.7 - 7.7 K/uL   Lymphocytes Relative 5 %   Lymphs Abs 0.6 (L) 0.7 - 4.0 K/uL   Monocytes Relative 4 %   Monocytes Absolute 0.5 0.1 - 1.0 K/uL   Eosinophils Relative 4 %   Eosinophils Absolute 0.6 0.0 - 0.7 K/uL   Basophils Relative 0 %   Basophils Absolute 0.0 0.0 - 0.1 K/uL    Comment: Performed at [facility], [place] [address], [place], [place] [address]  Urinalysis, Routine w reflex microscopic     Status: Abnormal   Collection Time: 03/05/18  1:43 PM  Result Value Ref Range   Color, Urine YELLOW YELLOW   APPearance CLEAR CLEAR   Specific Gravity, Urine 1.019 1.005 - 1.030   pH 6.0 5.0 - 8.0   Glucose, UA NEGATIVE NEGATIVE mg/dL   Hgb urine dipstick LARGE (A) NEGATIVE   Bilirubin Urine NEGATIVE NEGATIVE   Ketones, ur NEGATIVE NEGATIVE mg/dL   Protein, ur 100 (A) NEGATIVE mg/dL   Nitrite NEGATIVE NEGATIVE   Leukocytes, UA SMALL (A) NEGATIVE   RBC / HPF >50 (H) 0 - 5 RBC/hpf   WBC, UA 11-20 0 - 5 WBC/hpf   Bacteria, UA NONE SEEN NONE SEEN   Mucus PRESENT     Comment: Performed at [facility], [place] [address] [place], [place] [address]  I-Stat CG4 Lactic Acid, ED  (not at  [facility])     Status: Abnormal   Collection Time: 03/05/18  2:22 PM  Result Value Ref Range   Lactic Acid, Venous 3.44 (HH) 0.5 - 1.9 mmol/L   Comment NOTIFIED PHYSICIAN   I-Stat CG4 Lactic Acid, ED  (not at  [facility])     Status: None   Collection Time: 03/05/18  5:58 PM  Result Value Ref Range   Lactic Acid, Venous 1.35 0.5 -  1.9 mmol/L   Dg Chest 2 View  Result Date: 03/05/2018 CLINICAL DATA:  Vomiting and fever since placement of a urinary stent 03/03/2018. EXAM: CHEST - 2 VIEW COMPARISON:  PA and lateral chest 11/30/2017 and 02/10/2016.  FINDINGS: The lungs are clear. Heart size is normal. No pneumothorax or pleural fluid. No acute or focal bony abnormality. IMPRESSION: Negative chest. Electronically Signed   By: Inge Rise M.D.   On: 03/05/2018 14:56   Dg Abd 1 View  Result Date: 03/05/2018 CLINICAL DATA:  History of ureteral stent placement 03/03/2018. Onset abdominal pain, dizziness, nausea and chills developing after the procedure. EXAM: ABDOMEN - 1 VIEW COMPARISON:  CT abdomen and pelvis 02/08/2018.  KUB 06/29/2017. FINDINGS: The bowel gas pattern is normal. Left double-J ureteral stent projects in good position. 0.8 cm in diameter calcification in the right upper quadrant correlates with a nonobstructing right renal stone which is seen on the comparison examinations. IMPRESSION: No acute finding. Left double-J stent projects in good position. No change in a 0.8 cm nonobstructing stone lower pole right kidney. Electronically Signed   By: Inge Rise M.D.   On: 03/05/2018 16:16   US Renal  Result Date: 03/05/2018 CLINICAL DATA:  Recent ureteral stent placement, fever EXAM: RENAL / URINARY TRACT ULTRASOUND COMPLETE COMPARISON:  CT abdomen and pelvis 02/08/2018 FINDINGS: Right Kidney: Length: 13.5 cm. Normal cortical thickness and echogenicity. Small cyst lateral RIGHT kidney 15 x 16 x 19 mm. Additional tiny cyst laterally at inferior pole 8 x 6 x 7 mm. No solid mass, hydronephrosis or shadowing calcification. Left Kidney: Length: 15.0 cm. Normal cortical thickness and echogenicity. No mass, hydronephrosis or shadowing calcification. Stent visualized at the renal pelvis. Bladder: Uterus stent visualized. Bladder well distended without mass or wall thickening. Ureteral jets visualized. IMPRESSION: Small RIGHT renal cysts. LEFT ureteral stent without hydronephrosis. Electronically Signed   By: Lavonia Dana M.D.   On: 03/05/2018 17:21    Pending Labs Unresulted Labs (From admission, onward)    Start     Ordered   03/12/18 0500   Creatinine, serum  (enoxaparin (LOVENOX)    CrCl >/= 30 ml/min)  Weekly,   R    Comments:  while on enoxaparin therapy    03/05/18 1839   03/06/18 1610  Basic metabolic panel  Daily,   R     03/05/18 1839   03/06/18 0500  CBC with Differential/Platelet  Daily,   R     03/05/18 1839   03/06/18 0500  Magnesium  Daily,   R     03/05/18 1839   03/05/18 1837  CBC  (enoxaparin (LOVENOX)    CrCl >/= 30 ml/min)  Once,   R    Comments:  Baseline for enoxaparin therapy IF NOT ALREADY DRAWN.  Notify MD if PLT < 100 K.    03/05/18 1839   03/05/18 1837  Creatinine, serum  (enoxaparin (LOVENOX)    CrCl >/= 30 ml/min)  Once,   R    Comments:  Baseline for enoxaparin therapy IF NOT ALREADY DRAWN.    03/05/18 1839   03/05/18 1343  Blood Culture (routine x 2)  BLOOD CULTURE X 2,   STAT     03/05/18 1342   03/05/18 1343  Urine culture  STAT,   STAT     03/05/18 1342          Vitals/Pain Today's Vitals   03/05/18 1700 03/05/18 1715 03/05/18 1730 03/05/18 1800  BP: 101/88  95/65 109/71  Pulse:   (!) 110 92  Resp: '16 19 20 13  ' Temp:      TempSrc:      SpO2: 97% 96% 96% 96%  Weight:      Height:      PainSc:        Isolation Precautions No active isolations  Medications Medications  metroNIDAZOLE (FLAGYL) IVPB 500 mg (0 mg Intravenous Stopped 03/05/18 1647)  ceFEPIme (MAXIPIME) 1 g in sodium chloride 0.9 % 100 mL IVPB (has no administration in time range)  lactated ringers bolus 1,000 mL (has no administration in time range)  levothyroxine (SYNTHROID, LEVOTHROID) tablet 25 mcg (has no administration in time range)  gabapentin (NEURONTIN) tablet 600 mg (has no administration in time range)  pantoprazole (PROTONIX) EC tablet 20 mg (has no administration in time range)  ALPRAZolam (XANAX) tablet 1 mg (has no administration in time range)  pravastatin (PRAVACHOL) tablet 40 mg (has no administration in time range)  enoxaparin (LOVENOX) injection 40 mg (has no administration in time range)   HYDROcodone-acetaminophen (NORCO/VICODIN) 5-325 MG per tablet 1-2 tablet (has no administration in time range)  senna-docusate (Senokot-S) tablet 1 tablet (has no administration in time range)  lactated ringers bolus 1,000 mL (0 mLs Intravenous Stopped 03/05/18 1522)  ceFEPIme (MAXIPIME) 2 g in sodium chloride 0.9 % 100 mL IVPB (0 g Intravenous Stopped 03/05/18 1521)  vancomycin (VANCOCIN) IVPB 1000 mg/200 mL premix (0 mg Intravenous Stopped 03/05/18 1647)  lactated ringers bolus 1,000 mL (0 mLs Intravenous Stopped 03/05/18 1734)  acetaminophen (TYLENOL) tablet 1,000 mg (1,000 mg Oral Given 03/05/18 1544)    Mobility walks with person assist

## 2018-03-05 NOTE — H&P (Addendum)
History and Physical  Marvin Gill BTD:[id number] DOB: [birth year] DOA: 03/05/2018 1332  Referring physician: Purcell Mouton (ED) PCP: Lavone Orn, MD  Outpatient Specialists: [facility] (Dr. Gloriann Loan)  HISTORY   Chief Complaint: fevers/chills and malaise  HPI: Marvin Gill is a 67 y.o. male with hx of AAA, anxiety, BPH, CAD with recent ureteral stent placement on 03/03/18 as outpatient ([facility]) and since procedure has felt malaise, generalized weakness and myalgia. He had nausea and emesis upon arriving home then over the next day developed subjective fevers and chills. His urologic hx includes BPH, benign bladder tumor, and hx of kidney stones. Recently seen in ED on Sep 1 for chills and flank pain -- found to have sterile pyuria and was treated with oral abx (Vantin). Patient did take ibuprofen prior to coming to [facility] ED. Is able to urinate and has not noticed frank hematuria.   Review of Systems:  +subjective fevers/chills, malaise and aches +nausea and emesis +bilateral hand swelling + mild dysuria (improving) - no cough - no chest pain, dyspnea on exertion - no edema, PND, orthopnea - no tarry, melanotic or bloody stools - no weight changes  Rest of systems reviewed are negative, except as per above history.   ED course:  Vitals Blood pressure 109/71, pulse 92, temperature 98.5 F (36.9 C), temperature source Oral, resp. rate 13, height '6\' 6"'  (1.981 m), weight 93 kg, SpO2 96 %. Received tylenol, cefepime 2gm IV, LR liter boluses x 3; vancomycin 1gm; flagyl 540m IV  Past Medical History:  Diagnosis Date  . Aneurysm of abdominal aorta (HCC)    AAA PER PER 08-04-16 3.1 CM X 3.1 CM [place] GRIFFIN FOR  . Anxiety   . Arthritis    back  . Benign localized prostatic hyperplasia with lower urinary tract symptoms (LUTS)   . Complication of anesthesia   . Coronary artery disease 2000   single vessel cad  . Fibromyalgia   . GERD (gastroesophageal reflux  disease)   . History of skin cancer   . Hypothyroidism   . PONV (postoperative nausea and vomiting)    NAUSEA ALL DAY AFTER 02-01-17 SURGERY, PT WANTS ANESTHESIA LIKE 06-23-15 SURGERY WITH DR  TGaynelle Arabian . S/P percutaneous transluminal coronary angioplasty    05-18-1999  to dLAD   Past Surgical History:  Procedure Laterality Date  . CARDIAC CATHETERIZATION  06-14-2001  dr gEinar Gip  normal coronary arteries and patent previous dLAD   . COLONOSCOPY WITH PROPOFOL N/A 12/13/2013   Procedure: COLONOSCOPY WITH PROPOFOL;  Surgeon: MGarlan Fair MD;  Location: [facility];  Service: Endoscopy;  Laterality: N/A;  . CORONARY ANGIOPLASTY  05-18-1999  dr mTami Ribas  PTCA balloon to dLAD 80% (single vessel disease),  ef 60%  . CYSTOSCOPY WITH FULGERATION N/A 08/15/2017   Procedure: CYSTOSCOP/ BLADDER BIOPSY/TURBT;  Surgeon: BNickie Retort MD;  [facility]  Service: Urology;  Laterality: N/A;  . CYSTOSCOPY WITH INSERTION OF UROLIFT N/A 06/23/2015   Procedure: CYSTOSCOPY WITH INSERTION OF UROLIFT;  Surgeon: SCarolan Clines MD;  Location: [facility];  Service: Urology;  Laterality: N/A;  . CYSTOSCOPY/URETEROSCOPY/HOLMIUM LASER/STENT PLACEMENT Bilateral 02/01/2017   Procedure: CYSTOSCOPY/URETEROSCOPY/HOLMIUM LASER/STENT PLACEMENT,STONE BASKETRY, BLADDER BIOPSY;  Surgeon: BNickie Retort MD;  [facility]  Service: Urology;  Laterality: Bilateral;  . ESOPHAGOGASTRODUODENOSCOPY  08-24-2006  . TRANSURETHRAL INCISION OF PROSTATE N/A 04/01/2014   Procedure: TRANSURETHRAL INCISION OF THE PROSTATE (TUIP);  Surgeon: SShane Crutch  [name], MD;  [facility];  Service: Urology;  Laterality: N/A;    Social History:  reports that he quit smoking about 14 years ago. His smoking use included cigarettes. He has a 25.00 pack-year smoking history. He has never used smokeless tobacco. He reports that he does not drink alcohol or use  drugs.  Allergies  Allergen Reactions  . Codeine Nausea And Vomiting    "made my head feel funny too"    Family History  Problem Relation Age of Onset  . Anuerysm Mother        Brain      Prior to Admission medications   Medication Sig Start Date End Date Taking? Authorizing Provider  ALPRAZolam Duanne Moron) 1 MG tablet Take 1 mg by mouth 2 (two) times daily.    Yes [provider]  aspirin EC 81 MG tablet Take 81 mg by mouth daily.   Yes [provider]  esomeprazole (NEXIUM) 40 MG capsule Take 40 mg by mouth every morning.    Yes [provider]  gabapentin (NEURONTIN) 600 MG tablet Take 600 mg by mouth 3 (three) times daily.  05/11/15  Yes [provider]  levothyroxine (SYNTHROID, LEVOTHROID) 25 MCG tablet Take 25 mcg by mouth daily before breakfast.   Yes [provider]  pravastatin (PRAVACHOL) 40 MG tablet Take 40 mg by mouth daily.  05/07/15  Yes [provider]  valACYclovir (VALTREX) 1000 MG tablet Take 1,000 mg by mouth 2 (two) times daily. X 7 DAYS, THEN 500 MG BID X 6 MONTHS   Yes [provider]  cephALEXin (KEFLEX) 500 MG capsule Take 1 capsule (500 mg total) by mouth 3 (three) times daily. Patient not taking: Reported on 03/05/2018 08/15/17   Nickie Retort, MD  HYDROcodone-acetaminophen [facility]) 5-325 MG tablet Take 1 tablet by mouth every 4 (four) hours as needed for moderate pain. Patient not taking: Reported on 03/05/2018 08/15/17   Nickie Retort, MD  phenazopyridine (PYRIDIUM) 200 MG tablet Take 1 tablet (200 mg total) by mouth 3 (three) times daily. Patient not taking: Reported on 03/05/2018 01/29/18   Delia Heady, PA-C    PHYSICAL EXAM   Temp:  [98.5 F (36.9 C)] 98.5 F (36.9 C) (10/06 1326) Pulse Rate:  [77-115] 92 (10/06 1800) Resp:  [13-26] 13 (10/06 1800) BP: (94-109)/(62-88) 109/71 (10/06 1800) SpO2:  [96 %-98 %] 96 % (10/06 1800) Weight:  [93 kg] 93 kg (10/06 1326)  BP 109/71   Pulse  92   Temp 98.5 F (36.9 C) (Oral)   Resp 13   Ht '6\' 6"'  (1.981 m)   Wt 93 kg   SpO2 96%   BMI 23.69 kg/m    GEN obese middle-aged caucasian male; mildly uncomfortable resting in bed  HEENT NCAT EOM intact PERRL; clear oropharynx, no cervical LAD; dry mucus membranes  JVP estimated 5 cm H2O above RA; no HJR ; no carotid bruits b/l ;  CV regular normal rate; normal S1 and S2; no m/r/g or S3/S4; PMI nondisplaced; no parasternal heave  RESP CTA b/l; breathing unlabored and symmetric  ABD soft NT ND +normoactive BS  EXT warm throughout b/l; +non pitting edema of hands b/l;no lower extremity edema  PULSES  DP and radials 2+ intact b/l  SKIN/MSK overall flushed/erythematous and blotchy erythema of b/l hands and mild swelling NEURO/PSYCH AAOx4; no focal deficits   DATA   LABS ON ADMISSION:  Basic Metabolic Panel: Recent Labs  Lab 03/05/18 1343  NA 140  K 3.2*  CL 105  CO2 23  GLUCOSE 117*  BUN 35*  CREATININE 1.63*  CALCIUM 8.7*   CBC: Recent Labs  Lab 03/05/18 1343  WBC 13.3*  NEUTROABS 11.7*  HGB 15.6  HCT 45.6  MCV 90.1  PLT 209   Liver Function Tests: Recent Labs  Lab 03/05/18 1343  AST 25  ALT 22  ALKPHOS 64  BILITOT 0.9  PROT 7.1  ALBUMIN 3.7   No results for input(s): LIPASE, AMYLASE in the last 168 hours. No results for input(s): AMMONIA in the last 168 hours. Coagulation:  Lab Results  Component Value Date   INR 0.9 05/09/2007   No results found for: PTT Lactic Acid, Venous:     Component Value Date/Time   LATICACIDVEN 1.35 03/05/2018 1758   Cardiac Enzymes: No results for input(s): CKTOTAL, CKMB, CKMBINDEX, TROPONINI in the last 168 hours. Urinalysis:    Component Value Date/Time   COLORURINE YELLOW 03/05/2018 [place] 03/05/2018 1343   LABSPEC 1.019 03/05/2018 1343   PHURINE 6.0 03/05/2018 1343   GLUCOSEU NEGATIVE 03/05/2018 1343   HGBUR LARGE (A) 03/05/2018 1343   BILIRUBINUR NEGATIVE 03/05/2018 1343   KETONESUR  NEGATIVE 03/05/2018 1343   PROTEINUR 100 (A) 03/05/2018 1343   UROBILINOGEN 0.2 06/02/2014 0745   NITRITE NEGATIVE 03/05/2018 1343   LEUKOCYTESUR SMALL (A) 03/05/2018 1343    BNP (last 3 results) No results for input(s): PROBNP in the last 8760 hours. CBG: No results for input(s): GLUCAP in the last 168 hours.  Radiological Exams on Admission: Dg Chest 2 View  Result Date: 03/05/2018 CLINICAL DATA:  Vomiting and fever since placement of a urinary stent 03/03/2018. EXAM: CHEST - 2 VIEW COMPARISON:  PA and lateral chest 11/30/2017 and 02/10/2016. FINDINGS: The lungs are clear. Heart size is normal. No pneumothorax or pleural fluid. No acute or focal bony abnormality. IMPRESSION: Negative chest. Electronically Signed   By: Inge Rise M.D.   On: 03/05/2018 14:56   Dg Abd 1 View  Result Date: 03/05/2018 CLINICAL DATA:  History of ureteral stent placement 03/03/2018. Onset abdominal pain, dizziness, nausea and chills developing after the procedure. EXAM: ABDOMEN - 1 VIEW COMPARISON:  CT abdomen and pelvis 02/08/2018.  KUB 06/29/2017. FINDINGS: The bowel gas pattern is normal. Left double-J ureteral stent projects in good position. 0.8 cm in diameter calcification in the right upper quadrant correlates with a nonobstructing right renal stone which is seen on the comparison examinations. IMPRESSION: No acute finding. Left double-J stent projects in good position. No change in a 0.8 cm nonobstructing stone lower pole right kidney. Electronically Signed   By: Inge Rise M.D.   On: 03/05/2018 16:16   US Renal  Result Date: 03/05/2018 CLINICAL DATA:  Recent ureteral stent placement, fever EXAM: RENAL / URINARY TRACT ULTRASOUND COMPLETE COMPARISON:  CT abdomen and pelvis 02/08/2018 FINDINGS: Right Kidney: Length: 13.5 cm. Normal cortical thickness and echogenicity. Small cyst lateral RIGHT kidney 15 x 16 x 19 mm. Additional tiny cyst laterally at inferior pole 8 x 6 x 7 mm. No solid mass,  hydronephrosis or shadowing calcification. Left Kidney: Length: 15.0 cm. Normal cortical thickness and echogenicity. No mass, hydronephrosis or shadowing calcification. Stent visualized at the renal pelvis. Bladder: Uterus stent visualized. Bladder well distended without mass or wall thickening. Ureteral jets visualized. IMPRESSION: Small RIGHT renal cysts. LEFT ureteral stent without hydronephrosis. Electronically Signed   By: Lavonia Dana M.D.   On: 03/05/2018 17:21  EKG: Independently reviewed. Sinus tachycardia at rate 160s bpm with runs of multiple PAC although cannot rule out atrial tachycardia with grouped beating. Clear groups of p-waves visible. Low voltage.    ASSESSMENT AND PLAN   Assessment: Marvin Gill is a 67 y.o. male with hx of AAA, anxiety, BPH,CAD with recent ureteral stent placement on 03/03/18 who presents with malaise and chills and found to be tachycardic and borderline hypotensive with leukocytosis, suggestive of sepsis. Tachycardia and BP appear to be fluid responsive. No longer toxic appearing on my exam. Renal US and KUB show stent is in good position. UA showed persistent hematuria, which is not new. Plan to treat broadly for gram negative sepsis since cannot rule out infection but his sx may be due to transient bacteremia post-procedure if no source is identified. Overnight urology had recommended discussion with his primary urologist tomorrow regarding need for further studies.   Active Problems:   Sepsis ([place])   Plan:   # Sepsis likely urinary source: transient bacteremia vs "true" infection/persistent source > initial lactate 3.44 --> 1.35 after 3L fluids; hemodynamically stable now - received vancomycin x 1 dose (03/05/18) - continue cefepime 1gm q12h (D1 03/05/18) and flagyl 542m IV q8h (D1 03/05/18) - awaiting blood and urine culture results - day team to contact [facility] (Dr. BGloriann Loan - additional LR liter overnight and bolus as needed - pain control  with hydrocodone-tylenol q4h prn pain  # AKI Cr 1.63 on admission (baseline Cr 0.8-1.0) > likely pre-renal given open stent and recent dehydration  - monitor Cr response after fluids  # Hypokalemia K to 3.2 likely due to contraction alk and dehydration  - replete overnight  # Hx of CAD single vessel and HLD - resume home statin - holding aspirin for now  # Chronic hypothyroidism - resume home synthroid 25 mcg daily  # Chronic GERD - resume PPI  # Hx of anxiety  - resume home xanax 189mBID prn  # Hx of fibromyalgia/neuropathy - resume home gabapentin     DVT Prophylaxis: lovenox Code Status:  Full Code Family Communication: significant other at bedside  Disposition Plan: admit to floor observation and discharge pending culture data and urology recs   Patient contact: Extended Emergency Contact Information Primary Emergency Contact: Kellam,Renee Address: [address]        [place] [id number]: [phone]: Friend Secondary Emergency Contact: [place]ada [place] [id number]: [phone]: Daughter  Time spent: > 3540mtes  Milas Schappell J Lizzie Cokley, MD [facility] Pager [phone] 7PM-7AM, please contact night-coverage [email or web address] Password [email or web address]/10/2017, 6:39 PM

## 2018-03-05 NOTE — ED Notes (Signed)
Lactic Acid 3.44

## 2018-03-05 NOTE — ED Notes (Signed)
[place] in with pt, unable to obtain vitals at the moment

## 2018-03-05 NOTE — ED Triage Notes (Signed)
Patient c/o generalized weakness, body aches, and fever since yesterday morning. Reports he had a stent placed in bladder x3 days ago. Denies chest pain. Tachycardic in triage.

## 2018-03-05 NOTE — ED Notes (Signed)
Ed provider Little notified patient has critical lactic acid value of 3.44

## 2018-03-05 NOTE — ED Provider Notes (Signed)
[facility] DEPT Provider Note   CSN: [id number] Arrival date & time: 03/05/18  1315     History   Chief Complaint Chief Complaint  Patient presents with  . Weakness    HPI Marvin Gill is a 67 y.o. male.  67yo M w/ PMH including AAA, CAD, BPH who p/w fevers, chills, and weakness.  3 days ago he had a stent placed in his bladder by Dr. Gloriann Loan with [facility].  He had multiple episodes of vomiting after he went home from the procedure but this resolved.  Since then he has had generalized weakness associated with body aches, subjective fevers and chills, dizziness with standing, and mild occasional shortness of breath.  He had dysuria after the procedure that has been slowly improving.  No cough or chest pain.  He took ibuprofen about 45 minutes prior to arrival.  No sick contacts.  No abdominal pain.  The history is provided by the patient.  Weakness     Past Medical History:  Diagnosis Date  . Aneurysm of abdominal aorta (HCC)    AAA PER PER 08-04-16 3.1 CM X 3.1 CM US AORTA SEES DR GRIFFIN FOR  . Anxiety   . Arthritis    back  . Benign localized prostatic hyperplasia with lower urinary tract symptoms (LUTS)   . Complication of anesthesia   . Coronary artery disease 2000   single vessel cad  . Fibromyalgia   . GERD (gastroesophageal reflux disease)   . History of skin cancer   . Hypothyroidism   . PONV (postoperative nausea and vomiting)    NAUSEA ALL DAY AFTER 02-01-17 SURGERY, PT WANTS ANESTHESIA LIKE 06-23-15 SURGERY WITH DR  [name]  . S/P percutaneous transluminal coronary angioplasty    05-18-1999  to dLAD    Patient Active Problem List   Diagnosis Date Noted  . Benign prostate hyperplasia 04/01/2014    Past Surgical History:  Procedure Laterality Date  . CARDIAC CATHETERIZATION  06-14-2001  dr Einar Gip   normal coronary arteries and patent previous dLAD   . COLONOSCOPY WITH PROPOFOL N/A 12/13/2013   Procedure: COLONOSCOPY  WITH PROPOFOL;  Surgeon: Garlan Fair, MD;  Location: [facility];  Service: Endoscopy;  Laterality: N/A;  . CORONARY ANGIOPLASTY  05-18-1999  dr Tami Ribas   PTCA balloon to dLAD 80% (single vessel disease),  ef 60%  . CYSTOSCOPY WITH FULGERATION N/A 08/15/2017   Procedure: CYSTOSCOP/ BLADDER BIOPSY/TURBT;  Surgeon: Nickie Retort, MD;  Location: [facility];  Service: Urology;  Laterality: N/A;  . CYSTOSCOPY WITH INSERTION OF UROLIFT N/A 06/23/2015   Procedure: CYSTOSCOPY WITH INSERTION OF UROLIFT;  Surgeon: Carolan Clines, MD;  Location: [facility];  Service: Urology;  Laterality: N/A;  . CYSTOSCOPY/URETEROSCOPY/HOLMIUM LASER/STENT PLACEMENT Bilateral 02/01/2017   Procedure: CYSTOSCOPY/URETEROSCOPY/HOLMIUM LASER/STENT PLACEMENT,STONE BASKETRY, BLADDER BIOPSY;  Surgeon: Nickie Retort, MD;  Location: [facility];  Service: Urology;  Laterality: Bilateral;  . ESOPHAGOGASTRODUODENOSCOPY  08-24-2006  . TRANSURETHRAL INCISION OF PROSTATE N/A 04/01/2014   Procedure: TRANSURETHRAL INCISION OF THE PROSTATE (TUIP);  Surgeon: Ailene Rud, MD;  Location: [facility];  Service: Urology;  Laterality: N/A;        Home Medications    Prior to Admission medications   Medication Sig Start Date End Date Taking? Authorizing Provider  ALPRAZolam Duanne Moron) 1 MG tablet Take 1 mg by mouth 2 (two) times daily.     [provider]  aspirin EC 81 MG tablet Take 81  mg by mouth daily.    [provider]  cephALEXin (KEFLEX) 500 MG capsule Take 1 capsule (500 mg total) by mouth 3 (three) times daily. 08/15/17   Nickie Retort, MD  esomeprazole (NEXIUM) 40 MG capsule Take 40 mg by mouth every morning.     [provider]  gabapentin (NEURONTIN) 600 MG tablet Take 600 mg by mouth 3 (three) times daily.  05/11/15   [provider]  HYDROcodone-acetaminophen (NORCO) 5-325 MG tablet Take 1 tablet by mouth every 4 (four)  hours as needed for moderate pain. 08/15/17   Nickie Retort, MD  levothyroxine (SYNTHROID, LEVOTHROID) 25 MCG tablet Take 25 mcg by mouth daily before breakfast.    [provider]  phenazopyridine (PYRIDIUM) 200 MG tablet Take 1 tablet (200 mg total) by mouth 3 (three) times daily. 01/29/18   Khatri, Hina, PA-C  pravastatin (PRAVACHOL) 40 MG tablet Take 40 mg by mouth daily.  05/07/15   [provider]  tamsulosin (FLOMAX) 0.4 MG CAPS capsule Take 0.4 mg by mouth daily.  05/15/15   [provider]  valACYclovir (VALTREX) 1000 MG tablet Take 1,000 mg by mouth 2 (two) times daily. X 7 DAYS, THEN 500 MG BID X 6 MONTHS    [provider]    Family History Family History  Problem Relation Age of Onset  . Anuerysm Mother        Brain    Social History Social History   Tobacco Use  . Smoking status: Former Smoker    Packs/day: 1.00    Years: 25.00    Pack years: 25.00    Types: Cigarettes    Last attempt to quit: 06/01/2003    Years since quitting: 14.7  . Smokeless tobacco: Never Used  Substance Use Topics  . Alcohol use: No    Frequency: Never  . Drug use: No     Allergies   Codeine   Review of Systems Review of Systems  Neurological: Positive for weakness.   All other systems reviewed and are negative except that which was mentioned in HPI   Physical Exam Updated Vital Signs BP 107/66   Pulse (!) 115   Temp 98.5 F (36.9 C) (Oral)   Resp 19   Ht 6\' 6"  (1.981 m)   Wt 93 kg   SpO2 97%   BMI 23.69 kg/m   Physical Exam  Constitutional: He is oriented to person, place, and time. He appears well-developed and well-nourished.  Ill appearing but non-toxic, flushed face, alert  HENT:  Head: Normocephalic and atraumatic.  Eyes: Conjunctivae are normal.  Neck: Neck supple.  Cardiovascular: Regular rhythm and normal heart sounds. Tachycardia present.  No murmur heard. Pulmonary/Chest: Effort normal and breath sounds normal.    Abdominal: Soft. Bowel sounds are normal. He exhibits no distension. There is no tenderness.  Musculoskeletal: He exhibits no edema.  Neurological: He is alert and oriented to person, place, and time.  Fluent speech  Skin: Skin is warm and dry.  Flushed face  Psychiatric: He has a normal mood and affect. Judgment normal.  Nursing note and vitals reviewed.    ED Treatments / Results  Labs (all labs ordered are listed, but only abnormal results are displayed) Labs Reviewed  COMPREHENSIVE METABOLIC PANEL - Abnormal; Notable for the following components:      Result Value   Potassium 3.2 (*)    Glucose, Bld 117 (*)    BUN 35 (*)    Creatinine, Ser 1.63 (*)  Calcium 8.7 (*)    GFR calc non Af Amer 42 (*)    GFR calc Af Amer 48 (*)    All other components within normal limits  CBC WITH DIFFERENTIAL/PLATELET - Abnormal; Notable for the following components:   WBC 13.3 (*)    Neutro Abs 11.7 (*)    Lymphs Abs 0.6 (*)    All other components within normal limits  URINALYSIS, ROUTINE W REFLEX MICROSCOPIC - Abnormal; Notable for the following components:   Hgb urine dipstick LARGE (*)    Protein, ur 100 (*)    Leukocytes, UA SMALL (*)    RBC / HPF >50 (*)    All other components within normal limits  I-STAT CG4 LACTIC ACID, ED - Abnormal; Notable for the following components:   Lactic Acid, Venous 3.44 (*)    All other components within normal limits  CULTURE, BLOOD (ROUTINE X 2)  CULTURE, BLOOD (ROUTINE X 2)  URINE CULTURE  I-STAT CG4 LACTIC ACID, ED    EKG EKG Interpretation  Date/Time:  Sunday March 05 2018 13:29:23 EDT Ventricular Rate:  165 PR Interval:    QRS Duration: 78 QT Interval:  271 QTC Calculation: 426 R Axis:   68 Text Interpretation:  Atrial fibrillation with rapid V-rate Low voltage, extremity and precordial leads A fib with RVR new from previous Confirmed by Theotis Burrow [phone]) on 03/05/2018 2:29:28 PM   Radiology Dg Chest 2 View  Result  Date: 03/05/2018 CLINICAL DATA:  Vomiting and fever since placement of a urinary stent 03/03/2018. EXAM: CHEST - 2 VIEW COMPARISON:  PA and lateral chest 11/30/2017 and 02/10/2016. FINDINGS: The lungs are clear. Heart size is normal. No pneumothorax or pleural fluid. No acute or focal bony abnormality. IMPRESSION: Negative chest. Electronically Signed   By: Inge Rise M.D.   On: 03/05/2018 14:56    Procedures .Critical Care Performed by: Sharlett Iles, MD Authorized by: Sharlett Iles, MD   Critical care provider statement:    Critical care time (minutes):  30   Critical care time was exclusive of:  Separately billable procedures and treating other patients   Critical care was necessary to treat or prevent imminent or life-threatening deterioration of the following conditions:  Sepsis   Critical care was time spent personally by me on the following activities:  Development of treatment plan with patient or surrogate, discussions with consultants, evaluation of patient's response to treatment, examination of patient, obtaining history from patient or surrogate, ordering and performing treatments and interventions, ordering and review of laboratory studies, ordering and review of radiographic studies, re-evaluation of patient's condition and review of old charts   (including critical care time)  Medications Ordered in ED Medications  metroNIDAZOLE (FLAGYL) IVPB 500 mg (500 mg Intravenous New Bag/Given 03/05/18 1524)  vancomycin (VANCOCIN) IVPB 1000 mg/200 mL premix (1,000 mg Intravenous New Bag/Given 03/05/18 1544)  lactated ringers bolus 1,000 mL (0 mLs Intravenous Stopped 03/05/18 1522)  ceFEPIme (MAXIPIME) 2 g in sodium chloride 0.9 % 100 mL IVPB (0 g Intravenous Stopped 03/05/18 1521)  lactated ringers bolus 1,000 mL (1,000 mLs Intravenous New Bag/Given 03/05/18 1523)  acetaminophen (TYLENOL) tablet 1,000 mg (1,000 mg Oral Given 03/05/18 1544)     Initial Impression /  Assessment and Plan / ED Course  I have reviewed the triage vital signs and the nursing notes.  Pertinent labs & imaging results that were available during my care of the patient were reviewed by me and considered in my medical decision  making (see chart for details).    He was ill-appearing but nontoxic on exam.  Initial EKG looked like atrial fibrillation with RVR.  By the time he was brought back to the room, his rate had slowed down and he appeared to be in sinus tachycardia.  Afebrile here but he just took ibuprofen.  Initial lactate 3.4.  Initiated code sepsis based on symptoms and recent procedure.  Gave vancomycin, flagyl, and cefepime.  Gave 2 L of IV fluids.  Lab work shows potassium 3.2, BUN 35, creatinine 1.6, WBC 13.3.  UA is nitrite negative, small leukocytes, large amount of blood, 11-20 WBCs, no bacteria.  UA is not overly suggestive of infection, is c/w post-stenting.  Chest x-ray clear with no evidence of pneumonia.  I discussed with urologist on-call given his recent procedure.  Dr. Bess Harvest recommended renal ultrasound and KUB to evaluate the stent.  If negative, he agrees with plan to admit to hospitalist for ongoing work-up of his infection. His UA is reassuring against urinary source of infection.  I am signing out to oncoming provider pending these imaging studies.  Final Clinical Impressions(s) / ED Diagnoses   Final diagnoses:  None    ED Discharge Orders    None       Croy Drumwright, Wenda Overland, MD 03/05/18 1610

## 2018-03-05 NOTE — Progress Notes (Signed)
Pharmacy Note   A consult was received from an ED physician for vancomycin and cefepime per pharmacy dosing.    The patient's profile has been reviewed for ht/wt/allergies/indication/available labs.    A one time order has been placed for vancomycin 1000 mg IV x1 and cefepime 2 gr IV x1 .  Further antibiotics/pharmacy consults should be ordered by admitting physician if indicated.                       Thank you,   Royetta Asal, PharmD, BCPS Pager [phone] 03/05/2018 3:17 PM

## 2018-03-05 NOTE — ED Notes (Signed)
Pt states morphine makes him hallucinate

## 2018-03-06 ENCOUNTER — Observation Stay (HOSPITAL_BASED_OUTPATIENT_CLINIC_OR_DEPARTMENT_OTHER): Payer: Medicare Other

## 2018-03-06 ENCOUNTER — Encounter (HOSPITAL_COMMUNITY): Payer: Self-pay | Admitting: Physician Assistant

## 2018-03-06 DIAGNOSIS — N179 Acute kidney failure, unspecified: Secondary | ICD-10-CM

## 2018-03-06 DIAGNOSIS — A419 Sepsis, unspecified organism: Principal | ICD-10-CM

## 2018-03-06 DIAGNOSIS — I4891 Unspecified atrial fibrillation: Secondary | ICD-10-CM

## 2018-03-06 DIAGNOSIS — R6520 Severe sepsis without septic shock: Secondary | ICD-10-CM | POA: Diagnosis not present

## 2018-03-06 DIAGNOSIS — I480 Paroxysmal atrial fibrillation: Secondary | ICD-10-CM

## 2018-03-06 HISTORY — PX: TRANSTHORACIC ECHOCARDIOGRAM: SHX275

## 2018-03-06 LAB — CBC WITH DIFFERENTIAL/PLATELET
BASOS ABS: 0 10*3/uL (ref 0.0–0.1)
BASOS PCT: 0 %
Eosinophils Absolute: 0.6 10*3/uL (ref 0.0–0.7)
Eosinophils Relative: 10 %
HEMATOCRIT: 38 % — AB (ref 39.0–52.0)
Hemoglobin: 12.6 g/dL — ABNORMAL LOW (ref 13.0–17.0)
Lymphocytes Relative: 13 %
Lymphs Abs: 0.8 10*3/uL (ref 0.7–4.0)
MCH: 29.9 pg (ref 26.0–34.0)
MCHC: 33.2 g/dL (ref 30.0–36.0)
MCV: 90.3 fL (ref 78.0–100.0)
MONO ABS: 0.2 10*3/uL (ref 0.1–1.0)
Monocytes Relative: 4 %
NEUTROS ABS: 4.8 10*3/uL (ref 1.7–7.7)
Neutrophils Relative %: 73 %
PLATELETS: 166 10*3/uL (ref 150–400)
RBC: 4.21 MIL/uL — ABNORMAL LOW (ref 4.22–5.81)
RDW: 14.4 % (ref 11.5–15.5)
WBC: 6.4 10*3/uL (ref 4.0–10.5)

## 2018-03-06 LAB — ECHOCARDIOGRAM COMPLETE
Height: 78 in
Weight: 3280 oz

## 2018-03-06 LAB — BASIC METABOLIC PANEL
ANION GAP: 8 (ref 5–15)
BUN: 23 mg/dL (ref 8–23)
CALCIUM: 8 mg/dL — AB (ref 8.9–10.3)
CO2: 24 mmol/L (ref 22–32)
Chloride: 107 mmol/L (ref 98–111)
Creatinine, Ser: 1 mg/dL (ref 0.61–1.24)
GFR calc Af Amer: >60 mL/min (ref >60)
GLUCOSE: 117 mg/dL — AB (ref 70–99)
Potassium: 3.5 mmol/L (ref 3.5–5.1)
SODIUM: 139 mmol/L (ref 135–145)

## 2018-03-06 LAB — MRSA PCR SCREENING: MRSA BY PCR: NEGATIVE

## 2018-03-06 LAB — PROTIME-INR
INR: 0.95
Prothrombin Time: 12.6 seconds (ref 11.4–15.2)

## 2018-03-06 LAB — APTT: aPTT: 37 seconds — ABNORMAL HIGH (ref 24–36)

## 2018-03-06 LAB — T4, FREE: Free T4: 0.72 ng/dL — ABNORMAL LOW (ref 0.82–1.77)

## 2018-03-06 LAB — URINE CULTURE: CULTURE: NO GROWTH

## 2018-03-06 LAB — TSH: TSH: 3.394 u[IU]/mL (ref 0.350–4.500)

## 2018-03-06 LAB — MAGNESIUM: Magnesium: 1.8 mg/dL (ref 1.7–2.4)

## 2018-03-06 MED ORDER — LIP MEDEX EX OINT
TOPICAL_OINTMENT | CUTANEOUS | Status: DC | PRN
  Administered 2018-03-07: 1 via TOPICAL
  Filled 2018-03-06: qty 7

## 2018-03-06 MED ORDER — DILTIAZEM LOAD VIA INFUSION
5.0000 mg | Freq: Once | INTRAVENOUS | Status: AC
  Administered 2018-03-06: 5 mg via INTRAVENOUS
  Filled 2018-03-06: qty 5

## 2018-03-06 MED ORDER — HEPARIN (PORCINE) IN NACL 100-0.45 UNIT/ML-% IJ SOLN
1300.0000 [IU]/h | INTRAMUSCULAR | Status: DC
  Administered 2018-03-06: 1100 [IU]/h via INTRAVENOUS
  Filled 2018-03-06: qty 250

## 2018-03-06 MED ORDER — DILTIAZEM HCL-DEXTROSE 100-5 MG/100ML-% IV SOLN (PREMIX)
5.0000 mg/h | INTRAVENOUS | Status: DC
  Administered 2018-03-06: 5 mg/h via INTRAVENOUS
  Administered 2018-03-06: 7.5 mg/h via INTRAVENOUS
  Administered 2018-03-07: 2.5 mg/h via INTRAVENOUS
  Filled 2018-03-06 (×3): qty 100

## 2018-03-06 MED ORDER — LIP MEDEX EX OINT
TOPICAL_OINTMENT | CUTANEOUS | Status: AC
  Filled 2018-03-06: qty 7

## 2018-03-06 MED ORDER — SODIUM CHLORIDE 0.9 % IV SOLN
1.0000 g | Freq: Three times a day (TID) | INTRAVENOUS | Status: DC
  Administered 2018-03-06 – 2018-03-07 (×3): 1 g via INTRAVENOUS
  Filled 2018-03-06 (×5): qty 1

## 2018-03-06 NOTE — Consult Note (Addendum)
Cardiology Consultation:   Patient ID: Marvin Gill; [id number]; 1951-05-23   Admit date: 03/05/2018 Date of Consult: 03/06/2018  Primary Care Provider: Lavone Orn, MD Primary Cardiologist: Not seen recently - recalls seeing Dr. Gwenlyn Found remotely  Chief Complaint: chills, aches  Patient Profile:   Marvin Gill is a 67 y.o. male with a hx of CAD s/p PTCA of dLAD 2000, 3.1cm AAA ([place] 07/2016), fibromyalgia, anxiety, GERD, recent renal stenting/hematuria, recent ~20-25lb unintentional weight loss who is being seen today for the evaluation of newly recognized atrial fibrillation at the request of Dr. Darrick Meigs.  History of Present Illness:   Marvin Gill has not been seen by cardiology for a long time, citing that he's done well so didn't see the need to f/u. Last PCI was in 2000 and last cath 2003 showed normal coronaries and patent PTCA site. Echo in 2007 showed normal LVEF. Nuclear stress test scanned in from 2010 showed abnormal EKG positive for ischemia, but nuclear imaging showed diaphragmatic attenuation without evidence for ischemia or infarct, EF 60%. There are no office notes available in Epic.  He has had issues with flank pain and hematuria the last few weeks. He was seen in the ER 9/1 for chills, flank pain and pyuria and was treated with cefpodoxime. He has been followed as OP by [facility] and underwent recent ureteral stenting on 03/03/18 as outpatient. He feels like he had a bad reaction to the anesthesia and was up with nausea/vomiting after arriving home. The next few days he developed malaise, chills and body aches. This admission's workup revealed possible sepsis with leukocytosis 13.3, hypokalemia of 3.2, AKI with BUN 35/Cr 1.63, and lactic acidosis of 3.44. UA shows large Hgb and small leukocytes. He was given IV fluids. He was started on cefepime, as well as flagyl and vancomycin (the latter 2 have since been discontinued). He is afebrile. He was in rapid atrial fib upon  arrival. Telemetry since that time shows paroxysmal atrial fib, ?possible flutter, and NSR. He went back into rapid AF this morning with HR 130s and BP 408-144Y systolic, but is asymptomatic. He denies any recent CP, SOB, palpitations. Blood cultures and urine cultures are pending. With above measures, lactic acid has normalized, Hgb 15.6->12.6, AKI has resolved. CXR shows NAD. Of note, prior HR while in NSR was 50s-60s by EKG.  Interestingly he does relay a history of 20-25lb weight loss in 3 month's time without clear cause.   Past Medical History:  Diagnosis Date  . Aneurysm of abdominal aorta (HCC)    a. 08-04-16 3.1 CM X 3.1 CM - sees Dr. Laurann Montana - F/u due 2021.  Marland Kitchen Anxiety   . Arthritis    back  . Benign localized prostatic hyperplasia with lower urinary tract symptoms (LUTS)   . Complication of anesthesia   . Coronary artery disease 2000   a. s/p PTCA to dLAD 2000. b. Normal coronaries in 2003, patent prior PTCA site.  . Fibromyalgia   . GERD (gastroesophageal reflux disease)   . History of skin cancer   . Hypothyroidism   . PONV (postoperative nausea and vomiting)    NAUSEA ALL DAY AFTER 02-01-17 SURGERY, PT WANTS ANESTHESIA LIKE 06-23-15 SURGERY WITH DR  [name]  . S/P percutaneous transluminal coronary angioplasty    05-18-1999  to dLAD    Past Surgical History:  Procedure Laterality Date  . CARDIAC CATHETERIZATION  06-14-2001  dr Einar Gip   normal coronary arteries and patent previous dLAD   . COLONOSCOPY  WITH PROPOFOL N/A 12/13/2013   Procedure: COLONOSCOPY WITH PROPOFOL;  Surgeon: Garlan Fair, MD;  Location: [facility];  Service: Endoscopy;  Laterality: N/A;  . CORONARY ANGIOPLASTY  05-18-1999  dr Tami Ribas   PTCA balloon to dLAD 80% (single vessel disease),  ef 60%  . CYSTOSCOPY WITH FULGERATION N/A 08/15/2017   Procedure: CYSTOSCOP/ BLADDER BIOPSY/TURBT;  Surgeon: Nickie Retort, MD;  Location: [facility];  Service: Urology;  Laterality: N/A;  .  CYSTOSCOPY WITH INSERTION OF UROLIFT N/A 06/23/2015   Procedure: CYSTOSCOPY WITH INSERTION OF UROLIFT;  Surgeon: Carolan Clines, MD;  Location: [facility];  Service: Urology;  Laterality: N/A;  . CYSTOSCOPY/URETEROSCOPY/HOLMIUM LASER/STENT PLACEMENT Bilateral 02/01/2017   Procedure: CYSTOSCOPY/URETEROSCOPY/HOLMIUM LASER/STENT PLACEMENT,STONE BASKETRY, BLADDER BIOPSY;  Surgeon: Nickie Retort, MD;  Location: [facility];  Service: Urology;  Laterality: Bilateral;  . ESOPHAGOGASTRODUODENOSCOPY  08-24-2006  . TRANSURETHRAL INCISION OF PROSTATE N/A 04/01/2014   Procedure: TRANSURETHRAL INCISION OF THE PROSTATE (TUIP);  Surgeon: Ailene Rud, MD;  Location: [facility];  Service: Urology;  Laterality: N/A;     Inpatient Medications: Scheduled Meds: . diltiazem  5 mg Intravenous Once  . enoxaparin (LOVENOX) injection  40 mg Subcutaneous Q24H  . feeding supplement (ENSURE ENLIVE)  237 mL Oral BID BM  . gabapentin  600 mg Oral TID  . levothyroxine  25 mcg Oral QAC breakfast  . pantoprazole  20 mg Oral Daily  . pravastatin  40 mg Oral Daily   Continuous Infusions: . sodium chloride 250 mL (03/05/18 2109)  . ceFEPime (MAXIPIME) IV    . diltiazem (CARDIZEM) infusion 5 mg/hr (03/06/18 1046)   PRN Meds: sodium chloride, ALPRAZolam, HYDROcodone-acetaminophen, lip balm, senna-docusate  Home Meds: Prior to Admission medications   Medication Sig Start Date End Date Taking? Authorizing Provider  ALPRAZolam Duanne Moron) 1 MG tablet Take 1 mg by mouth 2 (two) times daily.    Yes [provider]  aspirin EC 81 MG tablet Take 81 mg by mouth daily.   Yes [provider]  esomeprazole (NEXIUM) 40 MG capsule Take 40 mg by mouth every morning.    Yes [provider]  gabapentin (NEURONTIN) 600 MG tablet Take 600 mg by mouth 3 (three) times daily.  05/11/15  Yes [provider]  levothyroxine (SYNTHROID, LEVOTHROID) 25 MCG tablet Take 25  mcg by mouth daily before breakfast.   Yes [provider]  pravastatin (PRAVACHOL) 40 MG tablet Take 40 mg by mouth daily.  05/07/15  Yes [provider]  valACYclovir (VALTREX) 1000 MG tablet Take 1,000 mg by mouth 2 (two) times daily. X 7 DAYS, THEN 500 MG BID X 6 MONTHS   Yes [provider]  cephALEXin (KEFLEX) 500 MG capsule Take 1 capsule (500 mg total) by mouth 3 (three) times daily. Patient not taking: Reported on 03/05/2018 08/15/17   Nickie Retort, MD  HYDROcodone-acetaminophen [facility]) 5-325 MG tablet Take 1 tablet by mouth every 4 (four) hours as needed for moderate pain. Patient not taking: Reported on 03/05/2018 08/15/17   Nickie Retort, MD  phenazopyridine (PYRIDIUM) 200 MG tablet Take 1 tablet (200 mg total) by mouth 3 (three) times daily. Patient not taking: Reported on 03/05/2018 01/29/18   Delia Heady, PA-C    Allergies:    Allergies  Allergen Reactions  . Codeine Nausea And Vomiting    "made my head feel funny too"    Social History:   Social History   Socioeconomic History  .  Marital status: Divorced    Spouse name: Not on file  . Number of children: Not on file  . Years of education: Not on file  . Highest education level: Not on file  Occupational History  . Not on file  Social Needs  . Financial resource strain: Not on file  . Food insecurity:    Worry: Not on file    Inability: Not on file  . Transportation needs:    Medical: Not on file    Non-medical: Not on file  Tobacco Use  . Smoking status: Former Smoker    Packs/day: 1.00    Years: 25.00    Pack years: 25.00    Types: Cigarettes    Last attempt to quit: 06/01/2003    Years since quitting: 14.7  . Smokeless tobacco: Never Used  Substance and Sexual Activity  . Alcohol use: No    Frequency: Never  . Drug use: No  . Sexual activity: Not on file  Lifestyle  . Physical activity:    Days per week: Not on file    Minutes per session: Not on file  . Stress:  Not on file  Relationships  . Social connections:    Talks on phone: Not on file    Gets together: Not on file    Attends religious service: Not on file    Active member of club or organization: Not on file    Attends meetings of clubs or organizations: Not on file    Relationship status: Not on file  . Intimate partner violence:    Fear of current or ex partner: Not on file    Emotionally abused: Not on file    Physically abused: Not on file    Forced sexual activity: Not on file  Other Topics Concern  . Not on file  Social History Narrative  . Not on file    Family History:   The patient's family history includes Anuerysm in his mother.  ROS:  Please see the history of present illness.  All other ROS reviewed and negative.     Physical Exam/Data:   Vitals:   03/05/18 1900 03/05/18 1930 03/06/18 0511 03/06/18 0950  BP: 91/68 102/71 109/71 113/65  Pulse:  83 71 (!) 134  Resp: 14 16 14 18   Temp:  98.3 F (36.8 C) 98.2 F (36.8 C)   TempSrc:  Oral Oral   SpO2: 94% 96% 99% 100%  Weight:      Height:        Intake/Output Summary (Last 24 hours) at 03/06/2018 1057 Last data filed at 03/06/2018 0309 Gross per 24 hour  Intake 211.52 ml  Output -  Net 211.52 ml   Filed Weights   03/05/18 1326  Weight: 93 kg   Body mass index is 23.69 kg/m.  General: Well developed, well nourished, in no acute distress. Head: Normocephalic, atraumatic, sclera non-icteric, no xanthomas, nares are without discharge.  Neck: Negative for carotid bruits. JVD not elevated. Lungs: Clear bilaterally to auscultation without wheezes, rales, or rhonchi. Breathing is unlabored. Heart: RRR with S1 S2. No murmurs, rubs, or gallops appreciated. Abdomen: Soft, non-tender, non-distended with normoactive bowel sounds. No hepatomegaly. No rebound/guarding. No obvious abdominal masses. Msk:  Strength and tone appear normal for age. Extremities: No clubbing or cyanosis. No edema.  Distal pedal pulses  are 2+ and equal bilaterally. Neuro: Alert and oriented X 3. No facial asymmetry. No focal deficit. Moves all extremities spontaneously. Psych:  Responds to questions appropriately  with a normal affect.  EKG:  The EKG was personally reviewed and demonstrates Coarse atrial fibrillation with rapid ventricular response, otherwise no acute findings. Some suggestion of electrical alternans noted but heart size normal on CXR.   Relevant CV Studies: Referenced above (prior echo/cath are scans)  Laboratory Data:  Chemistry Recent Labs  Lab 03/05/18 1343 03/06/18 0507  NA 140 139  K 3.2* 3.5  CL 105 107  CO2 23 24  GLUCOSE 117* 117*  BUN 35* 23  CREATININE 1.63* 1.00  CALCIUM 8.7* 8.0*  GFRNONAA 42* >60  GFRAA 48* >60  ANIONGAP 12 8    Recent Labs  Lab 03/05/18 1343  PROT 7.1  ALBUMIN 3.7  AST 25  ALT 22  ALKPHOS 64  BILITOT 0.9   Hematology Recent Labs  Lab 03/05/18 1343 03/06/18 0507  WBC 13.3* 6.4  RBC 5.06 4.21*  HGB 15.6 12.6*  HCT 45.6 38.0*  MCV 90.1 90.3  MCH 30.8 29.9  MCHC 34.2 33.2  RDW 14.4 14.4  PLT 209 166   Cardiac EnzymesNo results for input(s): TROPONINI in the last 168 hours. No results for input(s): TROPIPOC in the last 168 hours.  BNPNo results for input(s): BNP, PROBNP in the last 168 hours.  DDimer No results for input(s): DDIMER in the last 168 hours.  Radiology/Studies:  Dg Chest 2 View  Result Date: 03/05/2018 CLINICAL DATA:  Vomiting and fever since placement of a urinary stent 03/03/2018. EXAM: CHEST - 2 VIEW COMPARISON:  PA and lateral chest 11/30/2017 and 02/10/2016. FINDINGS: The lungs are clear. Heart size is normal. No pneumothorax or pleural fluid. No acute or focal bony abnormality. IMPRESSION: Negative chest. Electronically Signed   By: Inge Rise M.D.   On: 03/05/2018 14:56   Dg Abd 1 View  Result Date: 03/05/2018 CLINICAL DATA:  History of ureteral stent placement 03/03/2018. Onset abdominal pain, dizziness, nausea and  chills developing after the procedure. EXAM: ABDOMEN - 1 VIEW COMPARISON:  CT abdomen and pelvis 02/08/2018.  KUB 06/29/2017. FINDINGS: The bowel gas pattern is normal. Left double-J ureteral stent projects in good position. 0.8 cm in diameter calcification in the right upper quadrant correlates with a nonobstructing right renal stone which is seen on the comparison examinations. IMPRESSION: No acute finding. Left double-J stent projects in good position. No change in a 0.8 cm nonobstructing stone lower pole right kidney. Electronically Signed   By: Inge Rise M.D.   On: 03/05/2018 16:16   US Renal  Result Date: 03/05/2018 CLINICAL DATA:  Recent ureteral stent placement, fever EXAM: RENAL / URINARY TRACT ULTRASOUND COMPLETE COMPARISON:  CT abdomen and pelvis 02/08/2018 FINDINGS: Right Kidney: Length: 13.5 cm. Normal cortical thickness and echogenicity. Small cyst lateral RIGHT kidney 15 x 16 x 19 mm. Additional tiny cyst laterally at inferior pole 8 x 6 x 7 mm. No solid mass, hydronephrosis or shadowing calcification. Left Kidney: Length: 15.0 cm. Normal cortical thickness and echogenicity. No mass, hydronephrosis or shadowing calcification. Stent visualized at the renal pelvis. Bladder: Uterus stent visualized. Bladder well distended without mass or wall thickening. Ureteral jets visualized. IMPRESSION: Small RIGHT renal cysts. LEFT ureteral stent without hydronephrosis. Electronically Signed   By: Lavonia Dana M.D.   On: 03/05/2018 17:21    Assessment and Plan:   1. Sepsis - etiology not yet determined but suspected to be urinary source per IM. It appears he is now only on cefepime. Await cultures. IM note indicates plan to consult urology this AM.  2.  Newly recognized paroxysmal atrial fib/flutter - EKGs show course afib but did have brief period of time on telemetry which looked more like flutter. He is asymptomatic. IM has ordered IV dilt drip - will also give 5mg  bolus now and follow. CHADSVASC  is 2 for age, vascular disease therefore anticoagulation should be considered although not clear if AF is specifically related to sepsis. I will review with Dr. Harrington Challenger. He does report frequent hematuria but OP hemoglobins have remained fairly stable. Agree with thyroid testing especially given unintentional weight loss.  3. Unintentional weight loss over 3 months' time - await cultures, thyroid function. CT renal stone protocol 01/2018 did not show any specific organ pathology. Further eval per primary team.  4. Acute kidney injury - resolved.  5. CAD s/p remote PTCA - no recent anginal symptoms. Nuc in 2010 showed abnormal EKG tracings but perfusion was normal per report. IM has held aspirin (may need to think resuming at all if we place him on NOAC).  For questions or updates, please contact [place] Please consult [email or web address] for contact info under Cardiology/STEMI.    Signed, Charlie Pitter, PA-C  03/06/2018 10:57 AM  Patient seen and examined   I agree with findings as noted above by D Dunn  Pt is a 67 yo with hx of CAD (remote PTCA of LAD in 2000; last nuclear stress test in 2010 no ischemia   Echo in 2007 normal LVEF)   He is s/p recent stenting to ureter.   Admitted with chills, malaise, leukocytoisis, lactic acidosis.   Treated with IV fluids and ABX    On admit found to be in afib with RVR.  The pt denies CP   No SOB   No dizziness (was on admit)  On exam, Pt is a thin 67 yo in NAD Neck:   JVP is normal     Lungs are CTA anteriorly Cardiac Irreg Irreg   No S3    No signifi murmurs Abd is supple Ext are without edema    He just started on IV diltiazem      Afib most likely driven by increased catecholamines from other medical problmes    He is rel comfortable    I agree with attempts at rate control Would schedule echo Check thryoid function  Esp with wt loss His CHADSVASc score is 2   Would recomm anticoagulation with IV heparin for now to prevent stroke  Will defer to  primary team    Will continue to follow   No evid for active ischemia  Dorris Carnes

## 2018-03-06 NOTE — Progress Notes (Signed)
[facility]  PROGRESS NOTE  Marvin Gill EYC:[id number] DOB: [birth year] DOA: 03/05/2018 PCP: Lavone Orn, MD   Brief HPI:    67 year old male with a history of AAA, anxiety, BPH, CAD with recent ureteral stent placement on 03/03/2018 as outpatient.  Patient since procedure felt malaise, generalized weakness and myalgia.  Also had nausea and vomiting after he went home.  He has history of BPH, benign bladder tumor and history of kidney stones.  Patient was admitted for possible sepsis, started on empiric antibiotics cefepime and Flagyl.   Subjective   Patient this morning went into A. fib with RVR.  No previous history of A. fib, no history of diabetes mellitus, no history of hypertension no previous stroke or seizures.   Assessment/Plan:     1. New onset atrial fibrillation with RVR-we will start Cardizem infusion, transferred to stepdown unit.CHA2DS2VASc score is 1.  Will consult cardiology for further recommendations.  I doubt patient is going to require anticoagulation.  Will obtain echocardiogram to assess ejection fraction.  2. Sepsis-patient was empirically started on cefepime and Flagyl, urine culture still pending.  3. Acute kidney injury-patient's creatinine 1.00 today, improved from 1.63 yesterday.  Follow BMP in am.  4. History of CAD-single-vessel disease, aspirin is on hold due to recent procedure.  Continue statin  5. Chronic hypothyroidism-continue Synthroid 25 mcg daily, will obtain TSH  6. Chronic GERD-continue PPI  7. History of anxiety-continue Xanax 1 mg p.o. twice daily as needed  8. History of fibromyalgia/neuropathy-continue gabapentin     CBG: No results for input(s): GLUCAP in the last 168 hours.  CBC: Recent Labs  Lab 03/05/18 1343 03/06/18 0507  WBC 13.3* 6.4  NEUTROABS 11.7* 4.8  HGB 15.6 12.6*  HCT 45.6 38.0*  MCV 90.1 90.3  PLT 209 149    Basic Metabolic Panel: Recent Labs  Lab 03/05/18 1343 03/06/18 0507  NA 140 139   K 3.2* 3.5  CL 105 107  CO2 23 24  GLUCOSE 117* 117*  BUN 35* 23  CREATININE 1.63* 1.00  CALCIUM 8.7* 8.0*  MG  --  1.8     DVT prophylaxis: Lovenox  Code Status: Full code  Family Communication: No family at bedside  Disposition Plan: likely home when medically ready for discharge   Consultants:  None  Procedures:  None   Antibiotics:   Anti-infectives (From admission, onward)   Start     Dose/Rate Route Frequency Ordered Stop   03/06/18 1200  ceFEPIme (MAXIPIME) 1 g in sodium chloride 0.9 % 100 mL IVPB     1 g 200 mL/hr over 30 Minutes Intravenous Every 8 hours 03/06/18 0816     03/06/18 0400  ceFEPIme (MAXIPIME) 1 g in sodium chloride 0.9 % 100 mL IVPB  Status:  Discontinued     1 g 200 mL/hr over 30 Minutes Intravenous Every 12 hours 03/05/18 1839 03/06/18 0816   03/05/18 1430  ceFEPIme (MAXIPIME) 2 g in sodium chloride 0.9 % 100 mL IVPB     2 g 200 mL/hr over 30 Minutes Intravenous  Once 03/05/18 1424 03/05/18 1521   03/05/18 1430  metroNIDAZOLE (FLAGYL) IVPB 500 mg  Status:  Discontinued     500 mg 100 mL/hr over 60 Minutes Intravenous Every 8 hours 03/05/18 1424 03/06/18 1031   03/05/18 1430  vancomycin (VANCOCIN) IVPB 1000 mg/200 mL premix     1,000 mg 200 mL/hr over 60 Minutes Intravenous  Once 03/05/18 1424 03/05/18 1647  Objective   Vitals:   03/05/18 1930 03/06/18 0511 03/06/18 0950 03/06/18 1100  BP: 102/71 109/71 113/65 111/88  Pulse: 83 71 (!) 134 81  Resp: 16 14 18 15   Temp: 98.3 F (36.8 C) 98.2 F (36.8 C)    TempSrc: Oral Oral    SpO2: 96% 99% 100% 97%  Weight:      Height:        Intake/Output Summary (Last 24 hours) at 03/06/2018 1148 Last data filed at 03/06/2018 1146 Gross per 24 hour  Intake 211.52 ml  Output 700 ml  Net -488.48 ml   Filed Weights   03/05/18 1326  Weight: 93 kg     Physical Examination:    General: Appears in no acute distress  Cardiovascular: Irregular rhythm, no murmurs  auscultated  Respiratory: Clear to auscultation bilaterally  Abdomen: Soft, nontender, no organomegaly  Extremities: No edema of the lower extremities noted  Neurologic: Alert, oriented x3, no focal deficit noted     Data Reviewed: I have personally reviewed following labs and imaging studies   Recent Results (from the past 240 hour(s))  Blood Culture (routine x 2)     Status: None (Preliminary result)   Collection Time: 03/05/18  1:43 PM  Result Value Ref Range Status   Specimen Description   Final    BLOOD RIGHT ANTECUBITAL Performed at [facility], [place] [address] [place], [place] [address]    Special Requests   Final    BOTTLES DRAWN AEROBIC AND ANAEROBIC Blood Culture adequate volume Performed at [facility] [address] [place], [place] [address]    Culture   Final    NO GROWTH < 12 HOURS Performed at [facility] [address], [place], [place] [address]    Report Status PENDING  Incomplete  Blood Culture (routine x 2)     Status: None (Preliminary result)   Collection Time: 03/05/18  1:48 PM  Result Value Ref Range Status   Specimen Description   Final    BLOOD LEFT ARM Performed at [facility] [address] [place], [place] [address]    Special Requests   Final    BOTTLES DRAWN AEROBIC AND ANAEROBIC Blood Culture adequate volume Performed at [facility] [address], [place], [place] [address]    Culture   Final    NO GROWTH < 12 HOURS Performed at [facility] [address], [place], [place] [address]    Report Status PENDING  Incomplete     Liver Function Tests: Recent Labs  Lab 03/05/18 1343  AST 25  ALT 22  ALKPHOS 64  BILITOT 0.9  PROT 7.1  ALBUMIN 3.7   No results for input(s): LIPASE, AMYLASE in the last 168 hours. No results for input(s): AMMONIA in the last 168 hours.  Cardiac Enzymes: No results for input(s): CKTOTAL, CKMB,  CKMBINDEX, TROPONINI in the last 168 hours. BNP (last 3 results) No results for input(s): BNP in the last 8760 hours.  ProBNP (last 3 results) No results for input(s): PROBNP in the last 8760 hours.    Studies: Dg Chest 2 View  Result Date: 03/05/2018 CLINICAL DATA:  Vomiting and fever since placement of a urinary stent 03/03/2018. EXAM: CHEST - 2 VIEW COMPARISON:  PA and lateral chest 11/30/2017 and 02/10/2016. FINDINGS: The lungs are clear. Heart size is normal. No pneumothorax or pleural fluid. No acute or focal bony abnormality. IMPRESSION: Negative chest. Electronically  Signed   By: Inge Rise M.D.   On: 03/05/2018 14:56   Dg Abd 1 View  Result Date: 03/05/2018 CLINICAL DATA:  History of ureteral stent placement 03/03/2018. Onset abdominal pain, dizziness, nausea and chills developing after the procedure. EXAM: ABDOMEN - 1 VIEW COMPARISON:  CT abdomen and pelvis 02/08/2018.  KUB 06/29/2017. FINDINGS: The bowel gas pattern is normal. Left double-J ureteral stent projects in good position. 0.8 cm in diameter calcification in the right upper quadrant correlates with a nonobstructing right renal stone which is seen on the comparison examinations. IMPRESSION: No acute finding. Left double-J stent projects in good position. No change in a 0.8 cm nonobstructing stone lower pole right kidney. Electronically Signed   By: Inge Rise M.D.   On: 03/05/2018 16:16   US Renal  Result Date: 03/05/2018 CLINICAL DATA:  Recent ureteral stent placement, fever EXAM: RENAL / URINARY TRACT ULTRASOUND COMPLETE COMPARISON:  CT abdomen and pelvis 02/08/2018 FINDINGS: Right Kidney: Length: 13.5 cm. Normal cortical thickness and echogenicity. Small cyst lateral RIGHT kidney 15 x 16 x 19 mm. Additional tiny cyst laterally at inferior pole 8 x 6 x 7 mm. No solid mass, hydronephrosis or shadowing calcification. Left Kidney: Length: 15.0 cm. Normal cortical thickness and echogenicity. No mass, hydronephrosis or  shadowing calcification. Stent visualized at the renal pelvis. Bladder: Uterus stent visualized. Bladder well distended without mass or wall thickening. Ureteral jets visualized. IMPRESSION: Small RIGHT renal cysts. LEFT ureteral stent without hydronephrosis. Electronically Signed   By: Lavonia Dana M.D.   On: 03/05/2018 17:21    Scheduled Meds: . enoxaparin (LOVENOX) injection  40 mg Subcutaneous Q24H  . feeding supplement (ENSURE ENLIVE)  237 mL Oral BID BM  . gabapentin  600 mg Oral TID  . levothyroxine  25 mcg Oral QAC breakfast  . pantoprazole  20 mg Oral Daily  . pravastatin  40 mg Oral Daily      Time spent: 25 min  [facility] Pager [phone]. If 7PM-7AM, please contact night-coverage at [email or web address], Office  [phone]  password [email or web address]  03/06/2018, 11:48 AM  LOS: 0 days

## 2018-03-06 NOTE — Progress Notes (Signed)
ANTICOAGULATION CONSULT NOTE - Initial Consult  Pharmacy Consult for Heparin Indication: atrial fibrillation  Allergies  Allergen Reactions  . Codeine Nausea And Vomiting    "made my head feel funny too"    Patient Measurements: Height: 6\' 6"  (198.1 cm) Weight: 205 lb (93 kg) IBW/kg (Calculated) : 91.4 Heparin Dosing Weight: actual weight  Vital Signs: Temp: 97.8 F (36.6 C) (10/07 1152) Temp Source: Oral (10/07 1152) BP: 111/88 (10/07 1100) Pulse Rate: 81 (10/07 1100)  Labs: Recent Labs    03/05/18 1343 03/06/18 0507  HGB 15.6 12.6*  HCT 45.6 38.0*  PLT 209 166  CREATININE 1.63* 1.00    Estimated Creatinine Clearance: 91.4 mL/min (by C-G formula based on SCr of 1 mg/dL).   Medical History: Past Medical History:  Diagnosis Date  . Aneurysm of abdominal aorta (HCC)    a. 08-04-16 3.1 CM X 3.1 CM - sees Dr. Laurann Montana - F/u due 2021.  Marland Kitchen Anxiety   . Arthritis    back  . Benign localized prostatic hyperplasia with lower urinary tract symptoms (LUTS)   . Complication of anesthesia   . Coronary artery disease 2000   a. s/p PTCA to dLAD 2000. b. Normal coronaries in 2003, patent prior PTCA site.  . Fibromyalgia   . GERD (gastroesophageal reflux disease)   . History of skin cancer   . Hypothyroidism   . PONV (postoperative nausea and vomiting)    NAUSEA ALL DAY AFTER 02-01-17 SURGERY, PT WANTS ANESTHESIA LIKE 06-23-15 SURGERY WITH DR  [name]  . S/P percutaneous transluminal coronary angioplasty    05-18-1999  to dLAD    Medications:  Scheduled:  . enoxaparin (LOVENOX) injection  40 mg Subcutaneous Q24H  . feeding supplement (ENSURE ENLIVE)  237 mL Oral BID BM  . gabapentin  600 mg Oral TID  . levothyroxine  25 mcg Oral QAC breakfast  . pantoprazole  20 mg Oral Daily  . pravastatin  40 mg Oral Daily   Infusions:  . sodium chloride 250 mL (03/05/18 2109)  . ceFEPime (MAXIPIME) IV    . diltiazem (CARDIZEM) infusion 10 mg/hr (03/06/18 1117)   PRN: sodium  chloride, ALPRAZolam, HYDROcodone-acetaminophen, lip balm, senna-docusate  Assessment: 67 yo male with new onset afib with RVR.  He is s/p ureteral stent placement with hematuria on 03/03/18.  Pharmacy is consulted to dose IV heparin.  PMH includes AAA, anxiety, BPH, CAD s/p PTCA  Baseline PTT, PT/INR pending Hgb 12.6 (down from 15.6 yesterday), Plts WNL SCr 1.0 (improved from 1.63 yesterday) PTA aspirin on hold  Goal of Therapy:  Heparin level 0.3-0.7 units/ml Monitor platelets by anticoagulation protocol: Yes   Plan:   No heparin bolus since received lovenox 40mg  at 10:42  At 5PM (6hr after lovenox), begin IV heparin infusion 1100 units/hr (~12 units/kg/hr)  Check heparin level in 8hrs  Daily heparin level and CBC  Monitor closely for worsening hematuria  Peggyann Juba, PharmD, BCPS Pager: [phone] 03/06/2018,2:47 PM

## 2018-03-06 NOTE — Progress Notes (Signed)
  Echocardiogram 2D Echocardiogram has been performed.  Bobbye Charleston 03/06/2018, 2:54 PM

## 2018-03-06 NOTE — Progress Notes (Signed)
Pt showed AFIB on tele monitor for a 2 min duration before reverting back to NSR. On-Call MD paged. Will continue to monitor.  Kizzie Ide, RN

## 2018-03-06 NOTE — Progress Notes (Signed)
PHARMACY NOTE:  ANTIMICROBIAL RENAL DOSAGE ADJUSTMENT  Current antimicrobial regimen includes a mismatch between antimicrobial dosage and estimated renal function.  As per policy approved by the Pharmacy & Therapeutics and Medical Executive Committees, the antimicrobial dosage will be adjusted accordingly.  Current antimicrobial dosage:  Cefepime 1gm IV q12h  Indication: sepsis  Renal Function:  Estimated Creatinine Clearance: 91.4 mL/min (by C-G formula based on SCr of 1 mg/dL). []      On intermittent HD, scheduled: []      On CRRT    Antimicrobial dosage has been changed to:  Cefepime 1 gm IV q8h  Additional comments:   Thank you for allowing pharmacy to be a part of this patient's care.  Lynelle Doctor, [facility] 03/06/2018 8:15 AM

## 2018-03-06 NOTE — Progress Notes (Signed)
Received call from central monitoring stating patient was in Afib with RVR HR 120s-150s. Pt is alert, no complaints of shortness of breath or  chest pain. Patient said he had no history of irregular heart rhythm. EKG done. MD notified. Rapid Response nurse at beside. Patient transferred to ICU/Stepdown.

## 2018-03-07 DIAGNOSIS — R531 Weakness: Secondary | ICD-10-CM

## 2018-03-07 DIAGNOSIS — N390 Urinary tract infection, site not specified: Secondary | ICD-10-CM | POA: Diagnosis present

## 2018-03-07 DIAGNOSIS — F419 Anxiety disorder, unspecified: Secondary | ICD-10-CM | POA: Diagnosis present

## 2018-03-07 DIAGNOSIS — E872 Acidosis: Secondary | ICD-10-CM | POA: Diagnosis present

## 2018-03-07 DIAGNOSIS — M797 Fibromyalgia: Secondary | ICD-10-CM | POA: Diagnosis present

## 2018-03-07 DIAGNOSIS — E440 Moderate protein-calorie malnutrition: Secondary | ICD-10-CM | POA: Diagnosis present

## 2018-03-07 DIAGNOSIS — I4891 Unspecified atrial fibrillation: Secondary | ICD-10-CM | POA: Diagnosis not present

## 2018-03-07 DIAGNOSIS — N400 Enlarged prostate without lower urinary tract symptoms: Secondary | ICD-10-CM | POA: Diagnosis present

## 2018-03-07 DIAGNOSIS — G629 Polyneuropathy, unspecified: Secondary | ICD-10-CM | POA: Diagnosis present

## 2018-03-07 DIAGNOSIS — Z79899 Other long term (current) drug therapy: Secondary | ICD-10-CM | POA: Diagnosis not present

## 2018-03-07 DIAGNOSIS — E785 Hyperlipidemia, unspecified: Secondary | ICD-10-CM | POA: Diagnosis present

## 2018-03-07 DIAGNOSIS — N179 Acute kidney failure, unspecified: Secondary | ICD-10-CM | POA: Diagnosis present

## 2018-03-07 DIAGNOSIS — I951 Orthostatic hypotension: Secondary | ICD-10-CM | POA: Diagnosis not present

## 2018-03-07 DIAGNOSIS — Z6823 Body mass index (BMI) 23.0-23.9, adult: Secondary | ICD-10-CM | POA: Diagnosis not present

## 2018-03-07 DIAGNOSIS — K219 Gastro-esophageal reflux disease without esophagitis: Secondary | ICD-10-CM | POA: Diagnosis present

## 2018-03-07 DIAGNOSIS — Z23 Encounter for immunization: Secondary | ICD-10-CM | POA: Diagnosis present

## 2018-03-07 DIAGNOSIS — Z85828 Personal history of other malignant neoplasm of skin: Secondary | ICD-10-CM | POA: Diagnosis not present

## 2018-03-07 DIAGNOSIS — E876 Hypokalemia: Secondary | ICD-10-CM | POA: Diagnosis present

## 2018-03-07 DIAGNOSIS — I2510 Atherosclerotic heart disease of native coronary artery without angina pectoris: Secondary | ICD-10-CM | POA: Diagnosis present

## 2018-03-07 DIAGNOSIS — I4892 Unspecified atrial flutter: Secondary | ICD-10-CM | POA: Diagnosis present

## 2018-03-07 DIAGNOSIS — E039 Hypothyroidism, unspecified: Secondary | ICD-10-CM | POA: Diagnosis present

## 2018-03-07 DIAGNOSIS — Z79890 Hormone replacement therapy (postmenopausal): Secondary | ICD-10-CM | POA: Diagnosis not present

## 2018-03-07 DIAGNOSIS — Z7982 Long term (current) use of aspirin: Secondary | ICD-10-CM | POA: Diagnosis not present

## 2018-03-07 DIAGNOSIS — I480 Paroxysmal atrial fibrillation: Secondary | ICD-10-CM | POA: Diagnosis not present

## 2018-03-07 DIAGNOSIS — Z87891 Personal history of nicotine dependence: Secondary | ICD-10-CM | POA: Diagnosis not present

## 2018-03-07 DIAGNOSIS — A419 Sepsis, unspecified organism: Secondary | ICD-10-CM | POA: Diagnosis present

## 2018-03-07 DIAGNOSIS — Z9861 Coronary angioplasty status: Secondary | ICD-10-CM | POA: Diagnosis not present

## 2018-03-07 LAB — CBC WITH DIFFERENTIAL/PLATELET
BASOS ABS: 0 10*3/uL (ref 0.0–0.1)
BASOS PCT: 0 %
EOS ABS: 0.6 10*3/uL — AB (ref 0.0–0.5)
Eosinophils Relative: 11 %
HEMATOCRIT: 42.2 % (ref 39.0–52.0)
Hemoglobin: 14 g/dL (ref 13.0–17.0)
Lymphocytes Relative: 21 %
Lymphs Abs: 1.2 10*3/uL (ref 0.7–4.0)
MCH: 30 pg (ref 26.0–34.0)
MCHC: 33.2 g/dL (ref 30.0–36.0)
MCV: 90.4 fL (ref 80.0–100.0)
MONO ABS: 0.3 10*3/uL (ref 0.1–1.0)
Monocytes Relative: 6 %
NEUTROS ABS: 3.5 10*3/uL (ref 1.7–7.7)
NEUTROS PCT: 62 %
PLATELETS: 182 10*3/uL (ref 150–400)
RBC: 4.67 MIL/uL (ref 4.22–5.81)
RDW: 14.3 % (ref 11.5–15.5)
WBC: 5.6 10*3/uL (ref 4.0–10.5)

## 2018-03-07 LAB — BASIC METABOLIC PANEL
Anion gap: 9 (ref 5–15)
BUN: 13 mg/dL (ref 8–23)
CO2: 24 mmol/L (ref 22–32)
Calcium: 8.4 mg/dL — ABNORMAL LOW (ref 8.9–10.3)
Chloride: 105 mmol/L (ref 98–111)
Creatinine, Ser: 0.85 mg/dL (ref 0.61–1.24)
GFR calc Af Amer: >60 mL/min (ref >60)
GLUCOSE: 109 mg/dL — AB (ref 70–99)
POTASSIUM: 3.3 mmol/L — AB (ref 3.5–5.1)
Sodium: 138 mmol/L (ref 135–145)

## 2018-03-07 LAB — T3: T3, Total: 52 ng/dL — ABNORMAL LOW (ref 71–180)

## 2018-03-07 LAB — HEPARIN LEVEL (UNFRACTIONATED): Heparin Unfractionated: 0.15 IU/mL — ABNORMAL LOW (ref 0.30–0.70)

## 2018-03-07 LAB — MAGNESIUM: MAGNESIUM: 1.8 mg/dL (ref 1.7–2.4)

## 2018-03-07 MED ORDER — METOPROLOL SUCCINATE ER 25 MG PO TB24
25.0000 mg | ORAL_TABLET | Freq: Every day | ORAL | Status: DC
  Administered 2018-03-07: 25 mg via ORAL
  Filled 2018-03-07: qty 1

## 2018-03-07 MED ORDER — APIXABAN 5 MG PO TABS
5.0000 mg | ORAL_TABLET | Freq: Two times a day (BID) | ORAL | Status: DC
  Administered 2018-03-07 – 2018-03-09 (×5): 5 mg via ORAL
  Filled 2018-03-07 (×5): qty 1

## 2018-03-07 MED ORDER — POTASSIUM CHLORIDE CRYS ER 20 MEQ PO TBCR
20.0000 meq | EXTENDED_RELEASE_TABLET | Freq: Once | ORAL | Status: AC
  Administered 2018-03-07: 20 meq via ORAL
  Filled 2018-03-07: qty 1

## 2018-03-07 NOTE — Progress Notes (Signed)
ANTICOAGULATION CONSULT NOTE - Follow Up  Pharmacy Consult for Heparin >> Eliquis Indication: atrial fibrillation  Allergies  Allergen Reactions  . Codeine Nausea And Vomiting    "made my head feel funny too"    Patient Measurements: Height: 6\' 6"  (198.1 cm) Weight: 205 lb (93 kg) IBW/kg (Calculated) : 91.4 Heparin Dosing Weight: actual weight  Vital Signs: Temp: 97.6 F (36.4 C) (10/08 0730) Temp Source: Oral (10/08 0730) BP: 106/70 (10/08 0000) Pulse Rate: 96 (10/08 0000)  Labs: Recent Labs    03/05/18 1343 03/06/18 0507 03/06/18 1458 03/07/18 0111  HGB 15.6 12.6*  --  14.0  HCT 45.6 38.0*  --  42.2  PLT 209 166  --  182  APTT  --   --  37*  --   LABPROT  --   --  12.6  --   INR  --   --  0.95  --   HEPARINUNFRC  --   --   --  0.15*  CREATININE 1.63* 1.00  --  0.85    Estimated Creatinine Clearance: 107.5 mL/min (by C-G formula based on SCr of 0.85 mg/dL).   Medical History: Past Medical History:  Diagnosis Date  . Aneurysm of abdominal aorta (HCC)    a. 08-04-16 3.1 CM X 3.1 CM - sees Dr. Laurann Montana - F/u due 2021.  Marland Kitchen Anxiety   . Arthritis    back  . Benign localized prostatic hyperplasia with lower urinary tract symptoms (LUTS)   . Complication of anesthesia   . Coronary artery disease 2000   a. s/p PTCA to dLAD 2000. b. Normal coronaries in 2003, patent prior PTCA site.  . Fibromyalgia   . GERD (gastroesophageal reflux disease)   . History of skin cancer   . Hypothyroidism   . PONV (postoperative nausea and vomiting)    NAUSEA ALL DAY AFTER 02-01-17 SURGERY, PT WANTS ANESTHESIA LIKE 06-23-15 SURGERY WITH DR  [name]  . S/P percutaneous transluminal coronary angioplasty    05-18-1999  to dLAD    Medications:  Scheduled:  . apixaban  5 mg Oral BID  . feeding supplement (ENSURE ENLIVE)  237 mL Oral BID BM  . gabapentin  600 mg Oral TID  . levothyroxine  25 mcg Oral QAC breakfast  . pantoprazole  20 mg Oral Daily  . pravastatin  40 mg Oral Daily    Infusions:  . sodium chloride 10 mL/hr at 03/07/18 0100  . ceFEPime (MAXIPIME) IV Stopped (03/07/18 0459)  . diltiazem (CARDIZEM) infusion 2.5 mg/hr (03/07/18 0736)   PRN: sodium chloride, ALPRAZolam, HYDROcodone-acetaminophen, lip balm, senna-docusate  Assessment: 67 yo male with new onset afib with RVR.  He is s/p ureteral stent placement with hematuria on 03/03/18.  Pharmacy is consulted to dose IV heparin.  PMH includes AAA, anxiety, BPH, CAD s/p PTCA  Baseline PTT, PT/INR on 10/7 WNL Hgb 14, Plts WNL SCr 0.85 (improved from 1.63 on 10/6) PTA aspirin on hold No hematuria reported  To transition to Eliquis today 10/8.  Per Cardiology, given paroxysmal nature during admission will not plan DCCV bu.t if he remains in this as outpatient can consider after 3 weeks of anticoagulation.  Goal of Therapy:  Therapeutic anticoagulation; prevention of VTE/stroke Monitor platelets by anticoagulation protocol: Yes   Plan:   DC heparin (done ~11AM)  Begin Eliquis 5mg  PO BID 1hr after heparin stopped  Provide education and coupon/voucher prior to discharge  Monitor closely for worsening hematuria  Peggyann Juba, PharmD, BCPS Pager: [phone] 03/07/2018,11:37  AM

## 2018-03-07 NOTE — Discharge Instructions (Signed)

## 2018-03-07 NOTE — Progress Notes (Signed)
[facility]  PROGRESS NOTE  Marvin Gill:[id number] DOB: 03-15-1951 DOA: 03/05/2018 PCP: Lavone Orn, MD   Brief HPI:    67 year old male with a history of AAA, anxiety, BPH, CAD with recent ureteral stent placement on 03/03/2018 as outpatient.  Patient since procedure felt malaise, generalized weakness and myalgia.  Also had nausea and vomiting after he went home.  He has history of BPH, benign bladder tumor and history of kidney stones.  Patient was admitted for possible sepsis, started on empiric antibiotics cefepime and Flagyl.   Subjective   Patient seen and examined, currently on IV Cardizem at 2.5 mg/h.  Heparin for anticoagulation.   Assessment/Plan:     1. New onset atrial fibrillation with RVR-patient developed new onset A. fib with RVR, was started on IV Cardizem and transferred to stepdown.  Cardiology was consulted for further recommendations and patient started on IV heparin.  CHA2DS2VASc score is 2.  He will need lifelong anticoagulation for stroke prevention.  Echocardiogram showed no wall motion abnormality.  EF 55 to 60%.  2. Sepsis-patient was empirically started on cefepime and Flagyl, urine culture showed no growth.  Will discontinue IV antibiotics.  3. Acute kidney injury-patient's creatinine 0.85 today, improved from 1.63 yesterday.  Follow BMP in am.  4. History of CAD-single-vessel disease, aspirin is on hold due to recent procedure.  Continue statin  5. Chronic hypothyroidism-continue Synthroid 25 mcg daily,  TSH is 3.394  6. Chronic GERD-continue PPI  7. History of anxiety-continue Xanax 1 mg p.o. twice daily as needed  8. History of fibromyalgia/neuropathy-continue gabapentin  9. Hypokalemia-we will replace potassium and check BMP in a.m.     CBG: No results for input(s): GLUCAP in the last 168 hours.  CBC: Recent Labs  Lab 03/05/18 1343 03/06/18 0507 03/07/18 0111  WBC 13.3* 6.4 5.6  NEUTROABS 11.7* 4.8 3.5  HGB 15.6 12.6*  14.0  HCT 45.6 38.0* 42.2  MCV 90.1 90.3 90.4  PLT 209 166 656    Basic Metabolic Panel: Recent Labs  Lab 03/05/18 1343 03/06/18 0507 03/07/18 0111  NA 140 139 138  K 3.2* 3.5 3.3*  CL 105 107 105  CO2 23 24 24   GLUCOSE 117* 117* 109*  BUN 35* 23 13  CREATININE 1.63* 1.00 0.85  CALCIUM 8.7* 8.0* 8.4*  MG  --  1.8 1.8     DVT prophylaxis: Lovenox  Code Status: Full code  Family Communication: No family at bedside  Disposition Plan: likely home when medically ready for discharge   Consultants:  None  Procedures:  None   Antibiotics:   Anti-infectives (From admission, onward)   Start     Dose/Rate Route Frequency Ordered Stop   03/06/18 1200  ceFEPIme (MAXIPIME) 1 g in sodium chloride 0.9 % 100 mL IVPB     1 g 200 mL/hr over 30 Minutes Intravenous Every 8 hours 03/06/18 0816     03/06/18 0400  ceFEPIme (MAXIPIME) 1 g in sodium chloride 0.9 % 100 mL IVPB  Status:  Discontinued     1 g 200 mL/hr over 30 Minutes Intravenous Every 12 hours 03/05/18 1839 03/06/18 0816   03/05/18 1430  ceFEPIme (MAXIPIME) 2 g in sodium chloride 0.9 % 100 mL IVPB     2 g 200 mL/hr over 30 Minutes Intravenous  Once 03/05/18 1424 03/05/18 1521   03/05/18 1430  metroNIDAZOLE (FLAGYL) IVPB 500 mg  Status:  Discontinued     500 mg 100 mL/hr over 60 Minutes Intravenous Every  8 hours 03/05/18 1424 03/06/18 1031   03/05/18 1430  vancomycin (VANCOCIN) IVPB 1000 mg/200 mL premix     1,000 mg 200 mL/hr over 60 Minutes Intravenous  Once 03/05/18 1424 03/05/18 1647       Objective   Vitals:   03/06/18 2347 03/07/18 0000 03/07/18 0400 03/07/18 0730  BP:  106/70    Pulse:  96    Resp:  16    Temp: 98 F (36.7 C)  97.6 F (36.4 C) 97.6 F (36.4 C)  TempSrc: Oral  Oral Oral  SpO2:  94%    Weight:      Height:        Intake/Output Summary (Last 24 hours) at 03/07/2018 1152 Last data filed at 03/07/2018 1024 Gross per 24 hour  Intake 823.27 ml  Output 1650 ml  Net -826.73 ml    Filed Weights   03/05/18 1326  Weight: 93 kg     Physical Examination:    Neck: Supple, no deformities, masses, or tenderness Lungs: Normal respiratory effort, bilateral clear to auscultation, no crackles or wheezes.  Heart: Irregular rhythm Abdomen: BS normoactive,soft,nondistended,non-tender to palpation,no organomegaly Extremities: No pretibial edema, no erythema, no cyanosis, no clubbing Neuro : Alert and oriented to time, place and person, No focal deficits     Data Reviewed: I have personally reviewed following labs and imaging studies   Recent Results (from the past 240 hour(s))  Blood Culture (routine x 2)     Status: None (Preliminary result)   Collection Time: 03/05/18  1:43 PM  Result Value Ref Range Status   Specimen Description   Final    BLOOD RIGHT ANTECUBITAL Performed at [facility] [address] [place], [place] [address]    Special Requests   Final    BOTTLES DRAWN AEROBIC AND ANAEROBIC Blood Culture adequate volume Performed at [facility] [address], [place], [place] [address]    Culture   Final    NO GROWTH 2 DAYS Performed at [facility] [address] [place], [place] [address]    Report Status PENDING  Incomplete  Urine culture     Status: None   Collection Time: 03/05/18  1:43 PM  Result Value Ref Range Status   Specimen Description   Final    URINE, RANDOM Performed at [facility] [address] [place], [place] [address]    Special Requests   Final    NONE Performed at [facility], [place] [address] [place], [place] [address]    Culture   Final    NO GROWTH Performed at [facility], [place] [address], [place], [place] [address]    Report Status 03/06/2018 FINAL  Final  Blood Culture (routine x 2)     Status: None (Preliminary result)   Collection Time: 03/05/18  1:48 PM  Result Value Ref Range Status   Specimen Description    Final    BLOOD LEFT ARM Performed at [facility] [address], [place], [place] [address]    Special Requests   Final    BOTTLES DRAWN AEROBIC AND ANAEROBIC Blood Culture adequate volume Performed at [facility] [address] [place], [place] [address]    Culture   Final    NO GROWTH 2 DAYS Performed at [facility] [address], [place], [place] [address]    Report Status PENDING  Incomplete  MRSA PCR Screening  Status: None   Collection Time: 03/06/18 10:33 AM  Result Value Ref Range Status   MRSA by PCR NEGATIVE NEGATIVE Final    Comment:        The GeneXpert MRSA Assay (FDA approved for NASAL specimens only), is one component of a comprehensive MRSA colonization surveillance program. It is not intended to diagnose MRSA infection nor to guide or monitor treatment for MRSA infections. Performed at [facility], [place] [address], [place], [place] [address]      Liver Function Tests: Recent Labs  Lab 03/05/18 1343  AST 25  ALT 22  ALKPHOS 64  BILITOT 0.9  PROT 7.1  ALBUMIN 3.7   No results for input(s): LIPASE, AMYLASE in the last 168 hours. No results for input(s): AMMONIA in the last 168 hours.  Cardiac Enzymes: No results for input(s): CKTOTAL, CKMB, CKMBINDEX, TROPONINI in the last 168 hours. BNP (last 3 results) No results for input(s): BNP in the last 8760 hours.  ProBNP (last 3 results) No results for input(s): PROBNP in the last 8760 hours.    Studies: Dg Chest 2 View  Result Date: 03/05/2018 CLINICAL DATA:  Vomiting and fever since placement of a urinary stent 03/03/2018. EXAM: CHEST - 2 VIEW COMPARISON:  PA and lateral chest 11/30/2017 and 02/10/2016. FINDINGS: The lungs are clear. Heart size is normal. No pneumothorax or pleural fluid. No acute or focal bony abnormality. IMPRESSION: Negative chest. Electronically Signed   By: Inge Rise M.D.   On: 03/05/2018 14:56    Dg Abd 1 View  Result Date: 03/05/2018 CLINICAL DATA:  History of ureteral stent placement 03/03/2018. Onset abdominal pain, dizziness, nausea and chills developing after the procedure. EXAM: ABDOMEN - 1 VIEW COMPARISON:  CT abdomen and pelvis 02/08/2018.  KUB 06/29/2017. FINDINGS: The bowel gas pattern is normal. Left double-J ureteral stent projects in good position. 0.8 cm in diameter calcification in the right upper quadrant correlates with a nonobstructing right renal stone which is seen on the comparison examinations. IMPRESSION: No acute finding. Left double-J stent projects in good position. No change in a 0.8 cm nonobstructing stone lower pole right kidney. Electronically Signed   By: Inge Rise M.D.   On: 03/05/2018 16:16   US Renal  Result Date: 03/05/2018 CLINICAL DATA:  Recent ureteral stent placement, fever EXAM: RENAL / URINARY TRACT ULTRASOUND COMPLETE COMPARISON:  CT abdomen and pelvis 02/08/2018 FINDINGS: Right Kidney: Length: 13.5 cm. Normal cortical thickness and echogenicity. Small cyst lateral RIGHT kidney 15 x 16 x 19 mm. Additional tiny cyst laterally at inferior pole 8 x 6 x 7 mm. No solid mass, hydronephrosis or shadowing calcification. Left Kidney: Length: 15.0 cm. Normal cortical thickness and echogenicity. No mass, hydronephrosis or shadowing calcification. Stent visualized at the renal pelvis. Bladder: Uterus stent visualized. Bladder well distended without mass or wall thickening. Ureteral jets visualized. IMPRESSION: Small RIGHT renal cysts. LEFT ureteral stent without hydronephrosis. Electronically Signed   By: Lavonia Dana M.D.   On: 03/05/2018 17:21    Scheduled Meds: . apixaban  5 mg Oral BID  . feeding supplement (ENSURE ENLIVE)  237 mL Oral BID BM  . gabapentin  600 mg Oral TID  . levothyroxine  25 mcg Oral QAC breakfast  . metoprolol succinate  25 mg Oral Daily  . pantoprazole  20 mg Oral Daily  . pravastatin  40 mg Oral Daily      Time spent: 25  min  [facility] Pager  [phone]. If 7PM-7AM, please contact night-coverage at [email or web address], Office  [phone]  password [place]  03/07/2018, 11:52 AM  LOS: 0 days

## 2018-03-07 NOTE — Progress Notes (Signed)
Initial Nutrition Assessment  DOCUMENTATION CODES:   Non-severe (moderate) malnutrition in context of social or environmental circumstances  INTERVENTION:   Magic Cup BID in vanilla.  NUTRITION DIAGNOSIS:   Moderate Malnutrition related to social / environmental circumstances, acute illness as evidenced by mild fat depletion, mild muscle depletion, percent weight loss.  GOAL:   Patient will meet greater than or equal to 90% of their needs  MONITOR:   PO intake, Labs, Weight trends, Supplement acceptance  REASON FOR ASSESSMENT:   Malnutrition Screening Tool    ASSESSMENT:   67 yo male, admitted for nausea, vomiting, suspected post-surgical sepsis or bacteremia. PMHx AAA, BPH, CAD, kidney stones, benign bladder tumor, recent ureteral stent placement (03/03/18).  Labs: potassium 3.3, T3 52 (L), T4 0.72 (L) Meds: levothyroxine, protonix, pravastatin  Pt resting at time of visit. Reports UBW 230-235# (104.5-106.8 kg), with a 25# wt loss over the last 2-3 months --> 11% wt loss. States his appetite is fine and normal. Per nsg, pt ate only a few bites of breakfast today. Per pt, breakfast tray was cold and unappetizing; lunch was much better and he ate well (meatloaf, mashed potatoes, broccoli). Pt retired in 2009 and went back to work earlier this year. Describes difficulty keeping up with regular meals d/t variable work schedule. He wonders if this, in addition to the stress of returning to work, has led to his wt loss. Pt lives with granddaughter, who is a healthy eater and mostly cooks for herself. He prefers a "meat and potatoes" style. Does not follow any special diet and reports taking vitamin B 1000mg  daily. Describes nausea and vomiting following sedation for recent surgery, but feels better today. Denies diarrhea or constipation, difficulty chewing or swallowing. Last BM 10/8. Reports no trouble with ambulation, though he is eager to get up out of his bed. Encouraged pt to eat  protein foods with every meal and to try to find a more consistent meal pattern at home. Pt amenable to trying Magic Cup with dinner.  NUTRITION - FOCUSED PHYSICAL EXAM:    Most Recent Value  Orbital Region  Mild depletion  Upper Arm Region  Moderate depletion  Thoracic and Lumbar Region  Mild depletion  Buccal Region  Mild depletion  Temple Region  Mild depletion  Clavicle Bone Region  Mild depletion  Clavicle and Acromion Bone Region  No depletion  Scapular Bone Region  No depletion  Dorsal Hand  Mild depletion  Patellar Region  Mild depletion  Anterior Thigh Region  Mild depletion  Posterior Calf Region  No depletion  Edema (RD Assessment)  Mild  Hair  Reviewed  Eyes  Reviewed  Mouth  Reviewed  Skin  Reviewed  Nails  Reviewed       Diet Order:   Diet Order            Diet Heart Room service appropriate? Yes; Fluid consistency: Thin  Diet effective now              EDUCATION NEEDS:   No education needs have been identified at this time  Skin:  Skin Assessment: Reviewed RN Assessment  Last BM:  10/8, type 4  Height:   Ht Readings from Last 1 Encounters:  03/05/18 6\' 6"  (1.981 m)    Weight:   Wt Readings from Last 1 Encounters:  03/05/18 93 kg    Ideal Body Weight:  97.3 kg  BMI:  Body mass index is 23.69 kg/m.  Estimated Nutritional Needs:   Kcal:  9390-3009 kcal/day(25-30 kcal/kg IBW)  Protein:  117-146 g protein/day(1.2-1.5 g/kg IBW)  Fluid:  1 mL/kcal or per MD discretion  Althea Grimmer, MS, RDN, LDN On-call pager: [phone]

## 2018-03-07 NOTE — Progress Notes (Addendum)
Progress Note  Patient Name: Marvin Gill Date of Encounter: 03/07/2018  Primary Cardiologist: Dorris Carnes, MD (remotely Dr. Gwenlyn Found but had not seen in 9 years, new to Dr. Harrington Challenger this admission)  Subjective   No CP or SOB. Feeling fine now. No chills or body aches. Tolerating heparin without frank hematuria.  Inpatient Medications    Scheduled Meds: . feeding supplement (ENSURE ENLIVE)  237 mL Oral BID BM  . gabapentin  600 mg Oral TID  . levothyroxine  25 mcg Oral QAC breakfast  . pantoprazole  20 mg Oral Daily  . pravastatin  40 mg Oral Daily   Continuous Infusions: . sodium chloride 10 mL/hr at 03/07/18 0100  . ceFEPime (MAXIPIME) IV Stopped (03/07/18 0459)  . diltiazem (CARDIZEM) infusion 2.5 mg/hr (03/07/18 0736)  . heparin Stopped (03/07/18 1124)   PRN Meds: sodium chloride, ALPRAZolam, HYDROcodone-acetaminophen, lip balm, senna-docusate   Vital Signs    Vitals:   03/06/18 2347 03/07/18 0000 03/07/18 0400 03/07/18 0730  BP:  106/70    Pulse:  96    Resp:  16    Temp: 98 F (36.7 C)  97.6 F (36.4 C) 97.6 F (36.4 C)  TempSrc: Oral  Oral Oral  SpO2:  94%    Weight:      Height:        Intake/Output Summary (Last 24 hours) at 03/07/2018 1126 Last data filed at 03/07/2018 1024 Gross per 24 hour  Intake 823.27 ml  Output 2350 ml  Net -1526.73 ml   Filed Weights   03/05/18 1326  Weight: 93 kg    Telemetry    Atrial fib rate controlled, brief overnight pause <2 sec - Personally Reviewed  Physical Exam   GEN: No acute distress.  HEENT: Normocephalic, atraumatic, sclera non-icteric. Neck: No JVD or bruits. Cardiac: Irregularly irregular, no murmurs, rubs, or gallops.  Radials/DP/PT 1+ and equal bilaterally.  Respiratory: Clear to auscultation bilaterally. Breathing is unlabored. GI: Soft, nontender, non-distended, BS +x 4. MS: no deformity. Extremities: No clubbing or cyanosis. No edema. Distal pedal pulses are 2+ and equal bilaterally. Neuro:   AAOx3. Follows commands. Psych:  Responds to questions appropriately with a normal affect.  Labs    Chemistry Recent Labs  Lab 03/05/18 1343 03/06/18 0507 03/07/18 0111  NA 140 139 138  K 3.2* 3.5 3.3*  CL 105 107 105  CO2 23 24 24   GLUCOSE 117* 117* 109*  BUN 35* 23 13  CREATININE 1.63* 1.00 0.85  CALCIUM 8.7* 8.0* 8.4*  PROT 7.1  --   --   ALBUMIN 3.7  --   --   AST 25  --   --   ALT 22  --   --   ALKPHOS 64  --   --   BILITOT 0.9  --   --   GFRNONAA 42* >60 >60  GFRAA 48* >60 >60  ANIONGAP 12 8 9      Hematology Recent Labs  Lab 03/05/18 1343 03/06/18 0507 03/07/18 0111  WBC 13.3* 6.4 5.6  RBC 5.06 4.21* 4.67  HGB 15.6 12.6* 14.0  HCT 45.6 38.0* 42.2  MCV 90.1 90.3 90.4  MCH 30.8 29.9 30.0  MCHC 34.2 33.2 33.2  RDW 14.4 14.4 14.3  PLT 209 166 182    Cardiac EnzymesNo results for input(s): TROPONINI in the last 168 hours. No results for input(s): TROPIPOC in the last 168 hours.   BNPNo results for input(s): BNP, PROBNP in the last 168 hours.  DDimer No results for input(s): DDIMER in the last 168 hours.   Radiology    Dg Chest 2 View  Result Date: 03/05/2018 CLINICAL DATA:  Vomiting and fever since placement of a urinary stent 03/03/2018. EXAM: CHEST - 2 VIEW COMPARISON:  PA and lateral chest 11/30/2017 and 02/10/2016. FINDINGS: The lungs are clear. Heart size is normal. No pneumothorax or pleural fluid. No acute or focal bony abnormality. IMPRESSION: Negative chest. Electronically Signed   By: Inge Rise M.D.   On: 03/05/2018 14:56   Dg Abd 1 View  Result Date: 03/05/2018 CLINICAL DATA:  History of ureteral stent placement 03/03/2018. Onset abdominal pain, dizziness, nausea and chills developing after the procedure. EXAM: ABDOMEN - 1 VIEW COMPARISON:  CT abdomen and pelvis 02/08/2018.  KUB 06/29/2017. FINDINGS: The bowel gas pattern is normal. Left double-J ureteral stent projects in good position. 0.8 cm in diameter calcification in the right  upper quadrant correlates with a nonobstructing right renal stone which is seen on the comparison examinations. IMPRESSION: No acute finding. Left double-J stent projects in good position. No change in a 0.8 cm nonobstructing stone lower pole right kidney. Electronically Signed   By: Inge Rise M.D.   On: 03/05/2018 16:16   US Renal  Result Date: 03/05/2018 CLINICAL DATA:  Recent ureteral stent placement, fever EXAM: RENAL / URINARY TRACT ULTRASOUND COMPLETE COMPARISON:  CT abdomen and pelvis 02/08/2018 FINDINGS: Right Kidney: Length: 13.5 cm. Normal cortical thickness and echogenicity. Small cyst lateral RIGHT kidney 15 x 16 x 19 mm. Additional tiny cyst laterally at inferior pole 8 x 6 x 7 mm. No solid mass, hydronephrosis or shadowing calcification. Left Kidney: Length: 15.0 cm. Normal cortical thickness and echogenicity. No mass, hydronephrosis or shadowing calcification. Stent visualized at the renal pelvis. Bladder: Uterus stent visualized. Bladder well distended without mass or wall thickening. Ureteral jets visualized. IMPRESSION: Small RIGHT renal cysts. LEFT ureteral stent without hydronephrosis. Electronically Signed   By: Lavonia Dana M.D.   On: 03/05/2018 17:21    Cardiac Studies   2d echo 03/06/18 Study Conclusions  - Left ventricle: The cavity size was normal. Wall thickness was   increased in a pattern of mild LVH. Systolic function was normal.   The estimated ejection fraction was in the range of 55% to 60%.   Although no diagnostic regional wall motion abnormality was   identified, this possibility cannot be completely excluded on the   basis of this study. The study was not technically sufficient to   allow evaluation of LV diastolic dysfunction due to atrial   fibrillation. - Aortic valve: Trileaflet; moderately calcified leaflets.   Sclerosis without stenosis. - Aorta: Mildly dilated aortic root. Aortic root dimension: 37 mm   (ED). - Mitral valve: Mildly calcified  annulus. There was trivial   regurgitation. - Left atrium: The atrium was mildly dilated. - Right ventricle: The cavity size was normal. Systolic function   was normal. - Right atrium: The atrium was mildly dilated. - Tricuspid valve: Peak RV-RA gradient (S): 17 mm Hg. - Pulmonary arteries: PA peak pressure: 20 mm Hg (S). - Inferior vena cava: The vessel was normal in size. The   respirophasic diameter changes were in the normal range (>= 50%),   consistent with normal central venous pressure.  Impressions:  - The patient was in atrial fibrillation. Normal LV size with mild   LV hypertrophy. EF 55-60%. Normal RV size and systolic function.   No significant valvular abnormalities.  Patient Profile  Marvin Gill is a 67 y.o. male with a hx of CAD s/p PTCA of dLAD 2000, 3.1cm AAA ([place] 07/2016), fibromyalgia, anxiety, GERD, recent renal stenting/hematuria, recent ~20-25lb unintentional weight loss who is being seen today for the evaluation of newly recognized atrial fibrillation at the request of Dr. Darrick Meigs.  Assessment & Plan    1. Sepsis - etiology not yet determined but suspected to be urinary source per IM. It appears he is now only on cefepime. Cultures NTD.  2. Newly recognized paroxysmal atrial fib/flutter - EKGs show course afib but did have brief period of time on telemetry which looked more like flutter. Diltiazem dose has been decreased overnight to 2.5mg /hr. This would equal 60mg  total daily per dose, although lowest possible CD dose is 120mg  daily. D/w Dr. Harrington Challenger. Will instead use Toprol 25mg  daily. Stop dilt drip 30 min after giving BB. Need to be cognizant of baseline HR 50s-60s while in NSR. Stop heparin and will order apixaban per pharmacy consult for next dose timing/education - discussed risks/benefits with patient. Given paroxysmal nature during admission will not plan DCCV but if he remains in this as outpatient can consider after 3 weeks of anticoagulation. Hopefully  will settle down as infection issues resolve. LVEF wnl. CHADSVASC 2.  3. Unintentional weight loss over 3 months' time - await cultures, thyroid function. CT renal stone protocol 01/2018 did not show any specific organ pathology. Further eval per primary team. TSH is normal but free T4 and total T3 slightly low.  4. Acute kidney injury - resolved.  5. CAD s/p remote PTCA - no recent anginal symptoms. Nuc in 2010 showed abnormal EKG tracings but perfusion was normal per report. IM has held aspirin (may need to think resuming at all if we place him on NOAC).  6. Hypokalemia - IM repleting.  For questions or updates, please contact [place] Please consult [email or web address] for contact info under Cardiology/STEMI.  Signed, Charlie Pitter, PA-C 03/07/2018, 11:26 AM    Patient seen and examined   I agree with findings of D Dunn above  Pt appears more comfortable today   Denies dizziness but has not been up On exam:  Lungs are relatively clear Cardiac exam   Irreg irreg   No signif murmurs  No S3 Ext are without edema  Pt's HR jumped up to 126 with sitting in bed.   I would recomm getting orthostatic BP and P   May need some IV fluids   Will continue to follow.  Dorris Carnes

## 2018-03-07 NOTE — Progress Notes (Signed)
ANTICOAGULATION CONSULT NOTE - Follow Up Consult  Pharmacy Consult for heparin Indication: atrial fibrillation  Allergies  Allergen Reactions  . Codeine Nausea And Vomiting    "made my head feel funny too"    Patient Measurements: Height: 6\' 6"  (198.1 cm) Weight: 205 lb (93 kg) IBW/kg (Calculated) : 91.4 Heparin Dosing Weight:   Vital Signs: Temp: 98 F (36.7 C) (10/07 2347) Temp Source: Oral (10/07 2347) BP: 106/70 (10/08 0000) Pulse Rate: 96 (10/08 0000)  Labs: Recent Labs    03/05/18 1343 03/06/18 0507 03/06/18 1458 03/07/18 0111  HGB 15.6 12.6*  --  14.0  HCT 45.6 38.0*  --  42.2  PLT 209 166  --  182  APTT  --   --  37*  --   LABPROT  --   --  12.6  --   INR  --   --  0.95  --   HEPARINUNFRC  --   --   --  0.15*  CREATININE 1.63* 1.00  --  0.85    Estimated Creatinine Clearance: 107.5 mL/min (by C-G formula based on SCr of 0.85 mg/dL).   Medications:  Infusions:  . sodium chloride 10 mL/hr at 03/07/18 0100  . ceFEPime (MAXIPIME) IV Stopped (03/06/18 2101)  . diltiazem (CARDIZEM) infusion 7.5 mg/hr (03/07/18 0100)  . heparin 1,100 Units/hr (03/07/18 0100)    Assessment: Patient with low heparin level.  No heparin issues per RN.  Goal of Therapy:  Heparin level 0.3-0.7 units/ml Monitor platelets by anticoagulation protocol: Yes   Plan:  Increase heparin to 1300 units/hr Recheck level at [place], Shea Stakes [place] 03/07/2018,2:07 AM

## 2018-03-08 DIAGNOSIS — I951 Orthostatic hypotension: Secondary | ICD-10-CM

## 2018-03-08 DIAGNOSIS — E440 Moderate protein-calorie malnutrition: Secondary | ICD-10-CM

## 2018-03-08 LAB — CBC WITH DIFFERENTIAL/PLATELET
Abs Immature Granulocytes: 0.02 10*3/uL (ref 0.00–0.07)
BASOS ABS: 0 10*3/uL (ref 0.0–0.1)
Basophils Relative: 0 %
Eosinophils Absolute: 0.4 10*3/uL (ref 0.0–0.5)
Eosinophils Relative: 6 %
HEMATOCRIT: 43.7 % (ref 39.0–52.0)
HEMOGLOBIN: 14.4 g/dL (ref 13.0–17.0)
IMMATURE GRANULOCYTES: 0 %
LYMPHS ABS: 1.6 10*3/uL (ref 0.7–4.0)
Lymphocytes Relative: 26 %
MCH: 29.5 pg (ref 26.0–34.0)
MCHC: 33 g/dL (ref 30.0–36.0)
MCV: 89.5 fL (ref 80.0–100.0)
Monocytes Absolute: 0.6 10*3/uL (ref 0.1–1.0)
Monocytes Relative: 10 %
NEUTROS PCT: 58 %
NRBC: 0 % (ref 0.0–0.2)
Neutro Abs: 3.5 10*3/uL (ref 1.7–7.7)
Platelets: 206 10*3/uL (ref 150–400)
RBC: 4.88 MIL/uL (ref 4.22–5.81)
RDW: 13.8 % (ref 11.5–15.5)
WBC: 6.1 10*3/uL (ref 4.0–10.5)

## 2018-03-08 LAB — BASIC METABOLIC PANEL
ANION GAP: 8 (ref 5–15)
BUN: 15 mg/dL (ref 8–23)
CHLORIDE: 104 mmol/L (ref 98–111)
CO2: 29 mmol/L (ref 22–32)
Calcium: 8.8 mg/dL — ABNORMAL LOW (ref 8.9–10.3)
Creatinine, Ser: 0.98 mg/dL (ref 0.61–1.24)
GFR calc non Af Amer: >60 mL/min (ref >60)
Glucose, Bld: 111 mg/dL — ABNORMAL HIGH (ref 70–99)
POTASSIUM: 4.1 mmol/L (ref 3.5–5.1)
Sodium: 141 mmol/L (ref 135–145)

## 2018-03-08 LAB — MAGNESIUM: Magnesium: 1.9 mg/dL (ref 1.7–2.4)

## 2018-03-08 MED ORDER — DILTIAZEM HCL 60 MG PO TABS
60.0000 mg | ORAL_TABLET | Freq: Four times a day (QID) | ORAL | Status: DC
  Administered 2018-03-08 – 2018-03-09 (×3): 60 mg via ORAL
  Filled 2018-03-08 (×5): qty 1

## 2018-03-08 MED ORDER — CEPHALEXIN 500 MG PO CAPS
500.0000 mg | ORAL_CAPSULE | Freq: Two times a day (BID) | ORAL | Status: DC
  Administered 2018-03-08 – 2018-03-09 (×3): 500 mg via ORAL
  Filled 2018-03-08 (×3): qty 1

## 2018-03-08 MED ORDER — SODIUM CHLORIDE 0.9 % IV BOLUS: 350.0000 mL | Freq: Once | INTRAVENOUS | Status: DC

## 2018-03-08 MED ORDER — SODIUM CHLORIDE 0.9 % IV SOLN: INTRAVENOUS | Status: DC

## 2018-03-08 MED ORDER — SODIUM CHLORIDE 0.9 % IV SOLN
INTRAVENOUS | Status: AC
  Administered 2018-03-08: 10:00:00 via INTRAVENOUS

## 2018-03-08 MED ORDER — DILTIAZEM HCL 30 MG PO TABS
30.0000 mg | ORAL_TABLET | Freq: Four times a day (QID) | ORAL | Status: DC
  Administered 2018-03-08 (×2): 30 mg via ORAL
  Filled 2018-03-08 (×3): qty 1

## 2018-03-08 MED ORDER — SODIUM CHLORIDE 0.9 % IV BOLUS
350.0000 mL | Freq: Once | INTRAVENOUS | Status: AC
  Administered 2018-03-08: 350 mL via INTRAVENOUS

## 2018-03-08 NOTE — Progress Notes (Signed)
PROGRESS NOTE    Marvin Gill  LEX:[id number] DOB: [birth year] DOA: 03/05/2018 PCP: Lavone Orn, MD  Brief Narrative:  The patient is a 67 year old male with a history of AAA, anxiety, BPH, CAD with recent ureteral stent placement on 03/03/2018 as outpatient.  Patient since procedure felt malaise, generalized weakness and myalgia.  Also had nausea and vomiting after he went home.  He has history of BPH, benign bladder tumor and history of kidney stones.  Patient was admitted for possible sepsis, started on empiric antibiotics cefepime and Flagyl and is now improving.  Hospitalization has been complicated by new onset atrial fibrillation and cardiology was consulted and adjusting medications.  Gust the case with urology who recommends continuing antibiotics for 7 days total and outpatient follow-up for ureteral stent removal.   Assessment & Plan:   Active Problems:   Sepsis ([place])   AKI (acute kidney injury) ([place])   Malnutrition of moderate degree   Orthostasis  New Onset Atrial Fibrillation with RVR -Patient developed new onset A. fib with RVR, was started on IV Cardizem and transferred to stepdown.  -Cardiology was consulted for further recommendations and patient started on IV heparin.  CHA2DS2VASc score is 2.   -He will need lifelong anticoagulation for stroke prevention.   -Echocardiogram showed no wall motion abnormality. EF 55 to 60%. -Now off of Cardizem gtt and Cardiology adjusting medications further due to Uncontrolled HR;  -Per cardiology DO not plan DCCV inpatient but if remains in A. fib can consider as an outpatient after 3 weeks of anticoagulation -Cardiology is discontinuing beta-blocker and starting diltiazem 30 mg every 6h and Dr. Harrington Challenger has increased dig to 60 mg every 6h now  Orthostasis -Was positive given Jump in HR today from lying to sitting -Cardiology gave a 350 mL bolus and Maintenance IVF Hydration at 125 mL/hr x 4 Hours -Repeat Orthostatic Vital Signs in AM     Sepsis ? Transient Bacteremia from a Urinary Source in setting of recent ureteral stent placement -Patient was empirically started on cefepime and Flagyl and Urine Culture showed no growth. -Had elevated WBC on Admission but now has no Leukocytosis -Discontinued IV antibiotics (03/07/18) but Discussed Case with Dr. Gloriann Loan of Urology who recommends continuing Abx with Keflex 500 mg po BID to complete 7 day course -He is supposed to have his ureteral stent removed on 03/10/2018 and I discussed this with Dr. Carlean Jews of urology and he currently recommends antibiotics and if patient is not discharged before then will reschedule for an outpatient stent removal. -Continue to Follow C/x's  -Per my discussion with urology they are recommending outpatient ureteral stent removal and can follow patient in outpatient setting.  I feel the patient may need a cystoscopy but I will defer this to urology at outpatient appointment  Acute Kidney Injury, improved -Improved; Given an additional bolus of IVF and maintenance IVF at 125 mL/hr x 4 hours   -BUN/Cr went from 35/1.63 -> 15/0.98 -Avoid Nephrotoxic Medications if possible -Repeat CMP in AM  History of CAD -Single-vessel Disease -Aspirin is on hold due to recent procedure but will be stopped due to the transition to Apixaban 5 mg po BID . -Continue Pravastatin  Hypothyroidism -Continue Synthroid 25 mcg daily -TSH is 3.394  GERD -C/w Pantoprazole 20 mg po Daily   Anxiety  -Continue Alprazolam 1 mg p.o. twice daily as needed for Anxiety   History of Fibromyalgia/Neuropathy -Continue Gabapentin 600 mg po TID  Hypokalemia Improved.  Patient's potassium went from 3.3 is  now 4.1 -Continue to monitor and replete as necessary -Repeat CMP in the a.m.  DVT prophylaxis: Anticoagulated with Apixaban 5 mg po BID Code Status: FULL CODE Family Communication: No family present at bedside Disposition Plan: Was transferred from SDU to Telemetry floor this  AM; Remain Inpatient for continued workup and treatment and anticipate D/C in the Next 24-48 hours if medically stable  Consultants:   Cardiology    Procedures:  ECHOCARDIOGRAM on 03/06/18 ------------------------------------------------------------------- Study Conclusions  - Left ventricle: The cavity size was normal. Wall thickness was   increased in a pattern of mild LVH. Systolic function was normal.   The estimated ejection fraction was in the range of 55% to 60%.   Although no diagnostic regional wall motion abnormality was   identified, this possibility cannot be completely excluded on the   basis of this study. The study was not technically sufficient to   allow evaluation of LV diastolic dysfunction due to atrial   fibrillation. - Aortic valve: Trileaflet; moderately calcified leaflets.   Sclerosis without stenosis. - Aorta: Mildly dilated aortic root. Aortic root dimension: 37 mm   (ED). - Mitral valve: Mildly calcified annulus. There was trivial   regurgitation. - Left atrium: The atrium was mildly dilated. - Right ventricle: The cavity size was normal. Systolic function   was normal. - Right atrium: The atrium was mildly dilated. - Tricuspid valve: Peak RV-RA gradient (S): 17 mm Hg. - Pulmonary arteries: PA peak pressure: 20 mm Hg (S). - Inferior vena cava: The vessel was normal in size. The   respirophasic diameter changes were in the normal range (>= 50%),   consistent with normal central venous pressure.  Impressions:  - The patient was in atrial fibrillation. Normal LV size with mild   LV hypertrophy. EF 55-60%. Normal RV size and systolic function.   No significant valvular abnormalities.   Antimicrobials:  Anti-infectives (From admission, onward)   Start     Dose/Rate Route Frequency Ordered Stop   03/08/18 1530  cephALEXin (KEFLEX) capsule 500 mg     500 mg Oral Every 12 hours 03/08/18 1520     03/06/18 1200  ceFEPIme (MAXIPIME) 1 g in sodium  chloride 0.9 % 100 mL IVPB  Status:  Discontinued     1 g 200 mL/hr over 30 Minutes Intravenous Every 8 hours 03/06/18 0816 03/07/18 1156   03/06/18 0400  ceFEPIme (MAXIPIME) 1 g in sodium chloride 0.9 % 100 mL IVPB  Status:  Discontinued     1 g 200 mL/hr over 30 Minutes Intravenous Every 12 hours 03/05/18 1839 03/06/18 0816   03/05/18 1430  ceFEPIme (MAXIPIME) 2 g in sodium chloride 0.9 % 100 mL IVPB     2 g 200 mL/hr over 30 Minutes Intravenous  Once 03/05/18 1424 03/05/18 1521   03/05/18 1430  metroNIDAZOLE (FLAGYL) IVPB 500 mg  Status:  Discontinued     500 mg 100 mL/hr over 60 Minutes Intravenous Every 8 hours 03/05/18 1424 03/06/18 1031   03/05/18 1430  vancomycin (VANCOCIN) IVPB 1000 mg/200 mL premix     1,000 mg 200 mL/hr over 60 Minutes Intravenous  Once 03/05/18 1424 03/05/18 1647     Subjective: Seen and examined at bedside and was frustrated with his heart rate going faster today.  No chest pain, lightheadedness or dizziness.  Wanting to know if something can be done about his dysuria so I Discussed the case with urology who recommends outpatient follow-up.  Heart rate is still irregularly irregular  and faster.  Cardiology gave the patient a bolus with improvement.  Patient denies any other complaints or concerns at this time.  Objective: Vitals:   03/08/18 0403 03/08/18 0800 03/08/18 0914 03/08/18 1353  BP:  119/77 129/85 125/71  Pulse:  (!) 104 (!) 45 70  Resp:  17 16 18   Temp: 97.8 F (36.6 C) 97.8 F (36.6 C) 97.8 F (36.6 C) 98.2 F (36.8 C)  TempSrc: Oral Oral Oral Oral  SpO2:  96% 99% 97%  Weight:      Height:        Intake/Output Summary (Last 24 hours) at 03/08/2018 1540 Last data filed at 03/08/2018 1052 Gross per 24 hour  Intake 0.11 ml  Output 2350 ml  Net -2349.89 ml   Filed Weights   03/05/18 1326  Weight: 93 kg   Examination: Physical Exam:  Constitutional: WN/WD Caucasian male in NAD and appears calm and comfortable Eyes: Lids and  conjunctivae normal, sclerae anicteric  ENMT: External Ears, Nose appear normal. Grossly normal hearing. Mucous membranes are moist.   Neck: Appears normal, supple, no cervical masses, normal ROM, no appreciable thyromegaly, no JVD Respiratory: Diminished to auscultation bilaterally, no wheezing, rales, rhonchi or crackles. Normal respiratory effort and patient is not tachypenic. No accessory muscle use.  Cardiovascular: Irregularly Irregular and Tachycardicc, No appreciable murmurs / rubs / gallops. S1 and S2 auscultated. No extremity edema. 2+ pedal pulses.  Abdomen: Soft, non-tender, non-distended. No masses palpated. No appreciable hepatosplenomegaly. Bowel sounds positive z4.  GU: Deferred. Musculoskeletal: No clubbing / cyanosis of digits/nails. No joint deformity upper and lower extremities. Good ROM, no contractures. Normal strength and muscle tone.  Skin: No rashes, lesions, ulcers on a limited skin evaluation. No induration; Warm and dry.  Neurologic: CN 2-12 grossly intact with no focal deficits. Romberg sign and cerebellar reflexes not assessed.  Psychiatric: Normal judgment and insight. Alert and oriented x 3. Frustrated mood and appropriate affect.   Data Reviewed: I have personally reviewed following labs and imaging studies  CBC: Recent Labs  Lab 03/05/18 1343 03/06/18 0507 03/07/18 0111 03/08/18 0328  WBC 13.3* 6.4 5.6 6.1  NEUTROABS 11.7* 4.8 3.5 3.5  HGB 15.6 12.6* 14.0 14.4  HCT 45.6 38.0* 42.2 43.7  MCV 90.1 90.3 90.4 89.5  PLT 209 166 182 696   Basic Metabolic Panel: Recent Labs  Lab 03/05/18 1343 03/06/18 0507 03/07/18 0111 03/08/18 0328  NA 140 139 138 141  K 3.2* 3.5 3.3* 4.1  CL 105 107 105 104  CO2 23 24 24 29   GLUCOSE 117* 117* 109* 111*  BUN 35* 23 13 15   CREATININE 1.63* 1.00 0.85 0.98  CALCIUM 8.7* 8.0* 8.4* 8.8*  MG  --  1.8 1.8 1.9   GFR: Estimated Creatinine Clearance: 93.3 mL/min (by C-G formula based on SCr of 0.98 mg/dL). Liver  Function Tests: Recent Labs  Lab 03/05/18 1343  AST 25  ALT 22  ALKPHOS 64  BILITOT 0.9  PROT 7.1  ALBUMIN 3.7   No results for input(s): LIPASE, AMYLASE in the last 168 hours. No results for input(s): AMMONIA in the last 168 hours. Coagulation Profile: Recent Labs  Lab 03/06/18 1458  INR 0.95   Cardiac Enzymes: No results for input(s): CKTOTAL, CKMB, CKMBINDEX, TROPONINI in the last 168 hours. BNP (last 3 results) No results for input(s): PROBNP in the last 8760 hours. HbA1C: No results for input(s): HGBA1C in the last 72 hours. CBG: No results for input(s): GLUCAP in the last 168  hours. Lipid Profile: No results for input(s): CHOL, HDL, LDLCALC, TRIG, CHOLHDL, LDLDIRECT in the last 72 hours. Thyroid Function Tests: Recent Labs    03/06/18 1111  TSH 3.394  FREET4 0.72*   Anemia Panel: No results for input(s): VITAMINB12, FOLATE, FERRITIN, TIBC, IRON, RETICCTPCT in the last 72 hours. Sepsis Labs: Recent Labs  Lab 03/05/18 1422 03/05/18 1758  LATICACIDVEN 3.44* 1.35   Recent Results (from the past 240 hour(s))  Blood Culture (routine x 2)     Status: None (Preliminary result)   Collection Time: 03/05/18  1:43 PM  Result Value Ref Range Status   Specimen Description   Final    BLOOD RIGHT ANTECUBITAL Performed at [facility] [address], [place], [place] [address]    Special Requests   Final    BOTTLES DRAWN AEROBIC AND ANAEROBIC Blood Culture adequate volume Performed at [facility] [address], [place], [place] [address]    Culture   Final    NO GROWTH 3 DAYS Performed at [facility], [place] [address] [place], [place] [address]    Report Status PENDING  Incomplete  Urine culture     Status: None   Collection Time: 03/05/18  1:43 PM  Result Value Ref Range Status   Specimen Description   Final    URINE, RANDOM Performed at [facility] [address] [place], [place]  [address]    Special Requests   Final    NONE Performed at [facility], [place] [address] [place], [place] [address]    Culture   Final    NO GROWTH Performed at [facility], [place] [address] [place], [place] [address]    Report Status 03/06/2018 FINAL  Final  Blood Culture (routine x 2)     Status: None (Preliminary result)   Collection Time: 03/05/18  1:48 PM  Result Value Ref Range Status   Specimen Description   Final    BLOOD LEFT ARM Performed at [facility] [address], [place], [place] [address]    Special Requests   Final    BOTTLES DRAWN AEROBIC AND ANAEROBIC Blood Culture adequate volume Performed at [facility] [address], [place], [place] [address]    Culture   Final    NO GROWTH 3 DAYS Performed at [facility], [place] [address], [place], [place] [address]    Report Status PENDING  Incomplete  MRSA PCR Screening     Status: None   Collection Time: 03/06/18 10:33 AM  Result Value Ref Range Status   MRSA by PCR NEGATIVE NEGATIVE Final    Comment:        The GeneXpert MRSA Assay (FDA approved for NASAL specimens only), is one component of a comprehensive MRSA colonization surveillance program. It is not intended to diagnose MRSA infection nor to guide or monitor treatment for MRSA infections. Performed at [facility], [place] [address], [place], [place] [address]     Radiology Studies: No results found.  Scheduled Meds: . apixaban  5 mg Oral BID  . cephALEXin  500 mg Oral Q12H  . diltiazem  60 mg Oral Q6H  . feeding supplement (ENSURE ENLIVE)  237 mL Oral BID BM  . gabapentin  600 mg Oral TID  . levothyroxine  25 mcg Oral QAC breakfast  . pantoprazole  20 mg Oral Daily  . pravastatin  40 mg Oral Daily  Continuous Infusions: . sodium chloride Stopped (03/07/18 1256)    LOS: 1 day   Kerney Elbe, DO [facility] PAGER is on [place]  If  7PM-7AM, please contact night-coverage [email or web address] Password [email or web address] 03/08/2018, 3:40 PM

## 2018-03-08 NOTE — Progress Notes (Addendum)
Progress Note  Patient Name: Marvin Gill Date of Encounter: 03/08/2018  Primary Cardiologist: Dorris Carnes, MD (remotely Dr. Gwenlyn Found but had not seen in 9 years, new to Dr. Harrington Challenger this admission)  Subjective   Asymptomatic from cardiac standpoint, but reports dysuria has returned ever since abx discontinued. Orthostatics were positive for 35 beat jump in HR with standing.  Inpatient Medications    Scheduled Meds: . apixaban  5 mg Oral BID  . feeding supplement (ENSURE ENLIVE)  237 mL Oral BID BM  . gabapentin  600 mg Oral TID  . levothyroxine  25 mcg Oral QAC breakfast  . metoprolol succinate  25 mg Oral Daily  . pantoprazole  20 mg Oral Daily  . pravastatin  40 mg Oral Daily   Continuous Infusions: . sodium chloride Stopped (03/07/18 1256)   PRN Meds: sodium chloride, ALPRAZolam, HYDROcodone-acetaminophen, lip balm, senna-docusate   Vital Signs    Vitals:   03/07/18 1905 03/08/18 0007 03/08/18 0400 03/08/18 0403  BP:   115/89   Pulse:   81   Resp:   17   Temp: 97.9 F (36.6 C) 98 F (36.7 C)  97.8 F (36.6 C)  TempSrc: Oral Oral  Oral  SpO2:   96%   Weight:      Height:        Intake/Output Summary (Last 24 hours) at 03/08/2018 0747 Last data filed at 03/08/2018 0635 Gross per 24 hour  Intake 517.8 ml  Output 2325 ml  Net -1807.2 ml   Filed Weights   03/05/18 1326  Weight: 93 kg    Telemetry    Atrial fib rates 110-130s - Personally Reviewed  Physical Exam   GEN: No acute distress.  HEENT: Normocephalic, atraumatic, sclera non-icteric. Neck: No JVD or bruits. Cardiac: Irregularly irregular, tachycardic, no murmurs, rubs, or gallops.  Radials/DP/PT 1+ and equal bilaterally.  Respiratory: Clear to auscultation bilaterally. Breathing is unlabored. GI: Soft, nontender, non-distended, BS +x 4. MS: no deformity. Extremities: No clubbing or cyanosis. No edema. Distal pedal pulses are 2+ and equal bilaterally. Neuro:  AAOx3. Follows commands. Psych:   Responds to questions appropriately with a normal affect.  Labs    Chemistry Recent Labs  Lab 03/05/18 1343 03/06/18 0507 03/07/18 0111 03/08/18 0328  NA 140 139 138 141  K 3.2* 3.5 3.3* 4.1  CL 105 107 105 104  CO2 23 24 24 29   GLUCOSE 117* 117* 109* 111*  BUN 35* 23 13 15   CREATININE 1.63* 1.00 0.85 0.98  CALCIUM 8.7* 8.0* 8.4* 8.8*  PROT 7.1  --   --   --   ALBUMIN 3.7  --   --   --   AST 25  --   --   --   ALT 22  --   --   --   ALKPHOS 64  --   --   --   BILITOT 0.9  --   --   --   GFRNONAA 42* >60 >60 >60  GFRAA 48* >60 >60 >60  ANIONGAP 12 8 9 8      Hematology Recent Labs  Lab 03/06/18 0507 03/07/18 0111 03/08/18 0328  WBC 6.4 5.6 6.1  RBC 4.21* 4.67 4.88  HGB 12.6* 14.0 14.4  HCT 38.0* 42.2 43.7  MCV 90.3 90.4 89.5  MCH 29.9 30.0 29.5  MCHC 33.2 33.2 33.0  RDW 14.4 14.3 13.8  PLT 166 182 206    Radiology    No results found.  Cardiac  Studies   2d echo 03/06/18 Study Conclusions  - Left ventricle: The cavity size was normal. Wall thickness was   increased in a pattern of mild LVH. Systolic function was normal.   The estimated ejection fraction was in the range of 55% to 60%.   Although no diagnostic regional wall motion abnormality was   identified, this possibility cannot be completely excluded on the   basis of this study. The study was not technically sufficient to   allow evaluation of LV diastolic dysfunction due to atrial   fibrillation. - Aortic valve: Trileaflet; moderately calcified leaflets.   Sclerosis without stenosis. - Aorta: Mildly dilated aortic root. Aortic root dimension: 37 mm   (ED). - Mitral valve: Mildly calcified annulus. There was trivial   regurgitation. - Left atrium: The atrium was mildly dilated. - Right ventricle: The cavity size was normal. Systolic function   was normal. - Right atrium: The atrium was mildly dilated. - Tricuspid valve: Peak RV-RA gradient (S): 17 mm Hg. - Pulmonary arteries: PA peak  pressure: 20 mm Hg (S). - Inferior vena cava: The vessel was normal in size. The   respirophasic diameter changes were in the normal range (>= 50%),   consistent with normal central venous pressure.  Impressions:  - The patient was in atrial fibrillation. Normal LV size with mild   LV hypertrophy. EF 55-60%. Normal RV size and systolic function.   No significant valvular abnormalities.  Patient Profile     Marvin Gill a 67 y.o.malewith a hx of CAD s/p PTCA ofdLAD 2000, 3.1cm AAA ([place] 07/2016), fibromyalgia, anxiety, GERD, recent renal stenting/hematuria, recent ~20-25lb unintentional weight losswho is being seen today for the evaluation of newly recognized atrial fibrillationat the request of Dr. Darrick Meigs. Of note patient has baseline HR 50-60s when in NSR.  Assessment & Plan    1.Sepsis -not clear if this was a true diagnosis, possibly instead presentation related to dehydration? Urine, blood cultures have normalized. No fever while inpatient. Abx discontinued but interestingly patient reports dysuria has returned. Will defer to IM.  2. Newly recognized paroxysmal atrial fib/flutter- did not really respond as well to metoprolol as he had to IV dilt. Will d/c BB and start diltiazem 30mg  q6hr. Will also give IVF bolus and drip this AM given positive orthostatics. Given paroxysmal nature during admission will not plan DCCV while inpatient but if he remains in this as outpatient can consider after 3 weeks of anticoagulation. LVEF wnl. CHADSVASC 2.  3. Unintentional weight loss over 3 months' time - cultures negative thus far. No specific localizing symptoms except has had hematuria with recent uro issues. CT renal stone protocol 01/2018 did not show any specific organ pathology. TSH is normal but free T4 and total T3 slightly low. Further eval per primary team.  4. Acute kidney injury -resolved.  5. CAD s/p remote PTCA -no recent anginal symptoms. Nuc in 2010 showed abnormal  EKG tracings but perfusion was normal per report. Should consider statin titration but can defer to OP setting. Will ask Dr. Harrington Challenger to comment on continuing to hold ASA now that he is on Eliquis (was held on admission).  6. Hypokalemia - IM repleted, now normal.  For questions or updates, please contact [place] Please consult [email or web address] for contact info under Cardiology/STEMI.  Signed, Charlie Pitter, PA-C 03/08/2018, 7:47 AM    Patient seen and examined   I agree with findings as noted by D Dunn above  Pt is comfortabl Lungs are  CTA   Cardiac exam;   Irreg irreg   No S3   Abd is benign   Ex are witout edema  Tele shows rates are not controlled 100s to 120  REcomm 1  Increase diltiazem to 60 q 6 hours 2  Stop ASA  3  Continue Eliquis   4  Continue statin   No symtpoms to suggest angina  Will contine to follow.

## 2018-03-09 LAB — CBC WITH DIFFERENTIAL/PLATELET
ABS IMMATURE GRANULOCYTES: 0.02 10*3/uL (ref 0.00–0.07)
BASOS ABS: 0 10*3/uL (ref 0.0–0.1)
Basophils Relative: 0 %
Eosinophils Absolute: 0.3 10*3/uL (ref 0.0–0.5)
Eosinophils Relative: 4 %
HEMATOCRIT: 41.8 % (ref 39.0–52.0)
HEMOGLOBIN: 13.9 g/dL (ref 13.0–17.0)
IMMATURE GRANULOCYTES: 0 %
LYMPHS ABS: 1.5 10*3/uL (ref 0.7–4.0)
LYMPHS PCT: 22 %
MCH: 29.9 pg (ref 26.0–34.0)
MCHC: 33.3 g/dL (ref 30.0–36.0)
MCV: 89.9 fL (ref 80.0–100.0)
Monocytes Absolute: 0.7 10*3/uL (ref 0.1–1.0)
Monocytes Relative: 11 %
NEUTROS PCT: 63 %
NRBC: 0 % (ref 0.0–0.2)
Neutro Abs: 4.2 10*3/uL (ref 1.7–7.7)
Platelets: 232 10*3/uL (ref 150–400)
RBC: 4.65 MIL/uL (ref 4.22–5.81)
RDW: 13.6 % (ref 11.5–15.5)
WBC: 6.7 10*3/uL (ref 4.0–10.5)

## 2018-03-09 LAB — COMPREHENSIVE METABOLIC PANEL
ALBUMIN: 3.1 g/dL — AB (ref 3.5–5.0)
ALK PHOS: 55 U/L (ref 38–126)
ALT: 20 U/L (ref 0–44)
AST: 17 U/L (ref 15–41)
Anion gap: 9 (ref 5–15)
BUN: 17 mg/dL (ref 8–23)
CALCIUM: 8.5 mg/dL — AB (ref 8.9–10.3)
CO2: 23 mmol/L (ref 22–32)
CREATININE: 0.78 mg/dL (ref 0.61–1.24)
Chloride: 106 mmol/L (ref 98–111)
GFR calc Af Amer: >60 mL/min (ref >60)
GFR calc non Af Amer: >60 mL/min (ref >60)
GLUCOSE: 107 mg/dL — AB (ref 70–99)
Potassium: 3.8 mmol/L (ref 3.5–5.1)
Sodium: 138 mmol/L (ref 135–145)
Total Bilirubin: 0.8 mg/dL (ref 0.3–1.2)
Total Protein: 6.2 g/dL — ABNORMAL LOW (ref 6.5–8.1)

## 2018-03-09 LAB — MAGNESIUM: Magnesium: 2 mg/dL (ref 1.7–2.4)

## 2018-03-09 LAB — PHOSPHORUS: Phosphorus: 3 mg/dL (ref 2.5–4.6)

## 2018-03-09 MED ORDER — LEVOTHYROXINE SODIUM 50 MCG PO TABS: 50.0000 ug | ORAL_TABLET | Freq: Every day | ORAL | Status: DC

## 2018-03-09 MED ORDER — ENSURE ENLIVE PO LIQD: 237.0000 mL | Freq: Two times a day (BID) | ORAL | 12 refills | Status: DC

## 2018-03-09 MED ORDER — APIXABAN 5 MG PO TABS: 5.0000 mg | ORAL_TABLET | Freq: Two times a day (BID) | ORAL | 0 refills | Status: DC

## 2018-03-09 MED ORDER — DILTIAZEM HCL ER COATED BEADS 180 MG PO CP24: 180.0000 mg | ORAL_CAPSULE | Freq: Every day | ORAL | 0 refills | Status: DC

## 2018-03-09 MED ORDER — SENNOSIDES-DOCUSATE SODIUM 8.6-50 MG PO TABS: 1.0000 | ORAL_TABLET | Freq: Two times a day (BID) | ORAL | 0 refills | Status: DC | PRN

## 2018-03-09 MED ORDER — LIP MEDEX EX OINT: TOPICAL_OINTMENT | CUTANEOUS | 0 refills | Status: DC | PRN

## 2018-03-09 MED ORDER — DILTIAZEM HCL ER COATED BEADS 180 MG PO CP24
180.0000 mg | ORAL_CAPSULE | Freq: Every day | ORAL | Status: DC
  Administered 2018-03-09: 180 mg via ORAL
  Filled 2018-03-09: qty 1

## 2018-03-09 MED ORDER — LEVOTHYROXINE SODIUM 50 MCG PO TABS: 50.0000 ug | ORAL_TABLET | Freq: Every day | ORAL | 0 refills | Status: AC

## 2018-03-09 MED ORDER — CEPHALEXIN 500 MG PO CAPS: 500.0000 mg | ORAL_CAPSULE | Freq: Two times a day (BID) | ORAL | 0 refills | Status: AC

## 2018-03-09 NOTE — Progress Notes (Signed)
OT Cancellation Note  Patient Details Name: Marvin Gill MRN: [id number] DOB: [birth year]   Cancelled Treatment:    Reason Eval/Treat Not Completed: OT screened, no needs identified, will sign off.  Pt up to bathroom himself and took shower himself.  Checked with pt:  No OT needs at this time  [facility] 03/09/2018, 3:23 PM  Lesle Chris, OTR/L Acute Rehabilitation Services [phone] [facility] pager [phone] office 03/09/2018

## 2018-03-09 NOTE — Progress Notes (Signed)
Patient verbalized understanding of discharge instructions. Patient is stable at discharge. 

## 2018-03-09 NOTE — Progress Notes (Signed)
Progress Note  Patient Name: Marvin Gill Date of Encounter: 03/09/2018  Primary Cardiologist: Dorris Carnes, MD (remotely Dr. Gwenlyn Found but had not seen in 9 years, new to Dr. Harrington Challenger this admission)  Subjective   Feels good  No dizziness  Shaving    Inpatient Medications    Scheduled Meds: . apixaban  5 mg Oral BID  . cephALEXin  500 mg Oral Q12H  . diltiazem  60 mg Oral Q6H  . feeding supplement (ENSURE ENLIVE)  237 mL Oral BID BM  . gabapentin  600 mg Oral TID  . [START ON 03/10/2018] levothyroxine  50 mcg Oral QAC breakfast  . pantoprazole  20 mg Oral Daily  . pravastatin  40 mg Oral Daily   Continuous Infusions: . sodium chloride Stopped (03/07/18 1256)   PRN Meds: sodium chloride, ALPRAZolam, HYDROcodone-acetaminophen, lip balm, senna-docusate   Vital Signs    Vitals:   03/08/18 0914 03/08/18 1353 03/08/18 2021 03/09/18 0450  BP: 129/85 125/71 123/83 114/76  Pulse: (!) 45 70 72 68  Resp: 16 18 20 20   Temp: 97.8 F (36.6 C) 98.2 F (36.8 C) 98.2 F (36.8 C) 98.2 F (36.8 C)  TempSrc: Oral Oral Oral Oral  SpO2: 99% 97% 98% 94%  Weight:      Height:        Intake/Output Summary (Last 24 hours) at 03/09/2018 1332 Last data filed at 03/09/2018 0804 Gross per 24 hour  Intake 1232.15 ml  Output 700 ml  Net 532.15 ml   Filed Weights   03/05/18 1326  Weight: 93 kg    Telemetry    Off of telemetry  EKG     SR  69 bpm    Physical Exam   GEN: No acute distress.  HEENT: Normocephalic, atraumatic, sclera non-icteric. Neck: No JVD or bruits. Cardiac: RRR   NO S3  no murmurs, rubs, or gallops.  Radials/DP/PT 1+ and equal bilaterally.  Respiratory: Clear to auscultation bilaterally. Breathing is unlabored. GI: Soft, nontender, non-distended, BS +x 4. MS: no deformity. Extremities: No clubbing or cyanosis. No edema. Distal pedal pulses are 2+ and equal bilaterally. Neuro:  AAOx3. Follows commands. Psych:  Responds to questions appropriately with a  normal affect.  Labs    Chemistry Recent Labs  Lab 03/05/18 1343  03/07/18 0111 03/08/18 0328 03/09/18 0500  NA 140   < > 138 141 138  K 3.2*   < > 3.3* 4.1 3.8  CL 105   < > 105 104 106  CO2 23   < > 24 29 23   GLUCOSE 117*   < > 109* 111* 107*  BUN 35*   < > 13 15 17   CREATININE 1.63*   < > 0.85 0.98 0.78  CALCIUM 8.7*   < > 8.4* 8.8* 8.5*  PROT 7.1  --   --   --  6.2*  ALBUMIN 3.7  --   --   --  3.1*  AST 25  --   --   --  17  ALT 22  --   --   --  20  ALKPHOS 64  --   --   --  55  BILITOT 0.9  --   --   --  0.8  GFRNONAA 42*   < > >60 >60 >60  GFRAA 48*   < > >60 >60 >60  ANIONGAP 12   < > 9 8 9    < > = values in this interval not displayed.  Hematology Recent Labs  Lab 03/07/18 0111 03/08/18 0328 03/09/18 0500  WBC 5.6 6.1 6.7  RBC 4.67 4.88 4.65  HGB 14.0 14.4 13.9  HCT 42.2 43.7 41.8  MCV 90.4 89.5 89.9  MCH 30.0 29.5 29.9  MCHC 33.2 33.0 33.3  RDW 14.3 13.8 13.6  PLT 182 206 232    Cardiac EnzymesNo results for input(s): TROPONINI in the last 168 hours. No results for input(s): TROPIPOC in the last 168 hours.   BNPNo results for input(s): BNP, PROBNP in the last 168 hours.   DDimer No results for input(s): DDIMER in the last 168 hours.   Radiology    No results found.  Cardiac Studies   2d echo 03/06/18 Study Conclusions  - Left ventricle: The cavity size was normal. Wall thickness was   increased in a pattern of mild LVH. Systolic function was normal.   The estimated ejection fraction was in the range of 55% to 60%.   Although no diagnostic regional wall motion abnormality was   identified, this possibility cannot be completely excluded on the   basis of this study. The study was not technically sufficient to   allow evaluation of LV diastolic dysfunction due to atrial   fibrillation. - Aortic valve: Trileaflet; moderately calcified leaflets.   Sclerosis without stenosis. - Aorta: Mildly dilated aortic root. Aortic root dimension: 37  mm   (ED). - Mitral valve: Mildly calcified annulus. There was trivial   regurgitation. - Left atrium: The atrium was mildly dilated. - Right ventricle: The cavity size was normal. Systolic function   was normal. - Right atrium: The atrium was mildly dilated. - Tricuspid valve: Peak RV-RA gradient (S): 17 mm Hg. - Pulmonary arteries: PA peak pressure: 20 mm Hg (S). - Inferior vena cava: The vessel was normal in size. The   respirophasic diameter changes were in the normal range (>= 50%),   consistent with normal central venous pressure.  Impressions:  - The patient was in atrial fibrillation. Normal LV size with mild   LV hypertrophy. EF 55-60%. Normal RV size and systolic function.   No significant valvular abnormalities.  Patient Profile     Marvin Gill is a 67 y.o. male with a hx of CAD s/p PTCA of dLAD 2000, 3.1cm AAA ([place] 07/2016), fibromyalgia, anxiety, GERD, recent renal stenting/hematuria, recent ~20-25lb unintentional weight loss who is being seen today for the evaluation of newly recognized atrial fibrillation at the request of Dr. Darrick Meigs.  Assessment & Plan   1. Newly recognized paroxysmal atrial fib/flutter -  Pt is back in SR    WOuld recomm switchind diltiazem to 180 CD     On Apixiban  2. Acute kidney injury - resolved.  3. CAD s/p remote PTCA - no recent anginal symptoms.   4   HL  Continue statin     Will make sure he has outpt f/u   For questions or updates, please contact [place] Please consult [email or web address] for contact info under Cardiology/STEMI.  Signed, Dorris Carnes, MD 03/09/2018, 1:32 PM

## 2018-03-09 NOTE — Discharge Summary (Signed)
Physician Discharge Summary  Marvin Gill KPV:[id number] DOB: April 09, 1951 DOA: 03/05/2018  PCP: Lavone Orn, MD  Admit date: 03/05/2018 Discharge date: 03/09/2018  Admitted From: Home Disposition: Home  Recommendations for Outpatient Follow-up:  1. Follow up with PCP in 1-2 weeks 2. Follow up with Cardiology as an outpatient; Appoint already scheduled 3. Follow up with Urology Dr. Gloriann Loan as an outpatient. Patient has an appointment for Stent removal tomorrow 4. Please obtain CMP/CBC, Mag, Phos in one week 5. Please follow up on the following pending results:  Home Health: No  Equipment/Devices: None recommended by PT  Discharge Condition: Stable CODE STATUS: FULL CODE Diet recommendation: Heart Healthy Diet  Brief/Interim Summary: The patient is a 67 year old male with a history of AAA, anxiety, BPH, CAD with recent ureteral stent placement on 03/03/2018 as outpatient. Patient since procedure felt malaise, generalized weakness and myalgia. Also had nausea and vomiting after he went home. He has history of BPH, benign bladder tumor and history of kidney stones. Patient was admitted for possible sepsis, started on empiric antibiotics cefepime and Flagyl and is now improving.  Hospitalization has been complicated by new onset atrial fibrillation and cardiology was consulted and adjusting medications.  Discussde the case with urology who recommends continuing antibiotics for 7 days total and outpatient follow-up for ureteral stent removal.   Discharge Diagnoses:  Active Problems:   Sepsis ([place])   AKI (acute kidney injury) ([place])   Malnutrition of moderate degree   Orthostasis  New Onset Atrial Fibrillation with RVR now in NSR -Patient developed new onset A. fib with RVR, was started on IV Cardizem and transferred to stepdown.  -Cardiology was consulted for further recommendations and patient started on IV heparin. CHA2DS2VASc score is2. -He will need lifelong anticoagulation  for stroke prevention.  -Echocardiogram showed no wall motion abnormality. EF 55 to 60%. -Now off of Cardizem gtt and Cardiology adjusting medications further due to Uncontrolled HR but now in NSR -Per cardiology they do not plan DCCV inpatient but if remains in A. fib can consider as an outpatient after 3 weeks of anticoagulation -Cardiology is discontinuing beta-blocker and started diltiazem 30 mg every 6h and Dr. Harrington Challenger has increased dig to 60 mg every 6h; Now has been changed to Diltiazem 180 mg CD and will continue -Follow up with Cardiology as an outpatient  -Improved -PT/OT recommending no Follow Up  Orthostasis, improved  -Was positive given Jump in HR today from lying to sitting -Cardiology gave a 350 mL bolus and Maintenance IVF Hydration at 125 mL/hr x 4 Hours yesterday  -Repeat Orthostatic Vital Signs this AM and was not Orthostatic   Sepsis ? Transient Bacteremia from a Urinary Source in setting of recent ureteral stent placement, improved -Met Criteria on admission being tachycardic, tachypenic, having a Leukocytosis and a presumed source of infection in the Urinary Tract -Sepsis Physiology is much improved and resolved.  -Patient was empirically started on cefepime and Flagyl and Urine Cultureshowed no growth so Abx were discontinued but then resumed at the recommendation of Urology  -Had elevated WBC on Admission but now has no Leukocytosis -Discontinued IV antibiotics (03/07/18) but Discussed Case with Dr. Gloriann Loan of Urology who recommends continuing Abx with Keflex 500 mg po BID to complete 7 day course -He is supposed to have his ureteral stent removed on 03/10/2018 and I discussed this with Dr. Gloriann Loan of urology and he currently recommends antibiotics and if patient is not discharged before then will reschedule for an outpatient stent removal but patient  is being discharged today so can follow up tomorrow  -Continue to Follow C/x's; Urine and Blood Cx negative sofar  -Per my  discussion with Urology they are recommending outpatient ureteral stent removal and can follow patient in outpatient setting.  I feel the patient may need a cystoscopy but I will defer this to urology at outpatient appointment -Patient has Stent removal scheduled for tomorrow   Acute Kidney Injury, improved -Improved; Given an additional bolus of IVF and maintenance IVF at 125 mL/hr x 4 hours   -BUN/Cr went from 35/1.63 -> 17/0.58 -Avoid Nephrotoxic Medications if possible -Repeat CMP as an outaptient   History of CAD -Single-vessel Disease -Aspirin was on hold due to recent procedure but will be stopped due to the transition to Apixaban 5 mg po BID . -Continue Pravastatin -Follow up with Cardiology as an outpatient   Hypothyroidism -Continued Synthroid 25 mcg daily and increased to 50 mcg po Daily  -TSH is 3.394, Free T3 was low at 52 and Free T4 was low at 0.72 -Follow up as an outpatient and repeat TFT's in 4-6 weeks and have Adjustment in medication   GERD -C/w Pantoprazole 20 mg po Daily   Anxiety  -Continue Alprazolam 1 mg p.o. twice daily as needed for Anxiety   History of Fibromyalgia/Neuropathy -Continue Gabapentin 600 mg po TID  Hypokalemia Improved.  Patient's potassium went from 3.3 is now 3.8 -Continue to monitor and replete as necessary -Repeat CMP as an outpatient   Discharge Instructions  Discharge Instructions    Call MD for:  difficulty breathing, headache or visual disturbances   Complete by:  As directed    Call MD for:  extreme fatigue   Complete by:  As directed    Call MD for:  hives   Complete by:  As directed    Call MD for:  persistant dizziness or light-headedness   Complete by:  As directed    Call MD for:  persistant nausea and vomiting   Complete by:  As directed    Call MD for:  redness, tenderness, or signs of infection (pain, swelling, redness, odor or green/yellow discharge around incision site)   Complete by:  As directed     Call MD for:  severe uncontrolled pain   Complete by:  As directed    Call MD for:  temperature >100.4   Complete by:  As directed    Diet - low sodium heart healthy   Complete by:  As directed    Discharge instructions   Complete by:  As directed    You were cared for by a hospitalist during your hospital stay. If you have any questions about your discharge medications or the care you received while you were in the hospital after you are discharged, you can call the unit and ask to speak with the hospitalist on call if the hospitalist that took care of you is not available. Once you are discharged, your primary care physician will handle any further medical issues. Please note that NO REFILLS for any discharge medications will be authorized once you are discharged, as it is imperative that you return to your primary care physician (or establish a relationship with a primary care physician if you do not have one) for your aftercare needs so that they can reassess your need for medications and monitor your lab values.  Follow up with PCP, Cardiology, and Urology. Take all medications as prescribed. If symptoms change or worsen please return to the ED  for evaluation   Increase activity slowly   Complete by:  As directed      Allergies as of 03/09/2018      Reactions   Codeine Nausea And Vomiting   "made my head feel funny too"      Medication List    STOP taking these medications   aspirin EC 81 MG tablet     TAKE these medications   ALPRAZolam 1 MG tablet Commonly known as:  XANAX Take 1 mg by mouth 2 (two) times daily.   apixaban 5 MG Tabs tablet Commonly known as:  ELIQUIS Take 1 tablet (5 mg total) by mouth 2 (two) times daily.   cephALEXin 500 MG capsule Commonly known as:  KEFLEX Take 1 capsule (500 mg total) by mouth every 12 (twelve) hours for 4 days. What changed:  when to take this   diltiazem 180 MG 24 hr capsule Commonly known as:  CARDIZEM CD Take 1 capsule (180  mg total) by mouth daily.   esomeprazole 40 MG capsule Commonly known as:  NEXIUM Take 40 mg by mouth every morning.   feeding supplement (ENSURE ENLIVE) Liqd Take 237 mLs by mouth 2 (two) times daily between meals.   gabapentin 600 MG tablet Commonly known as:  NEURONTIN Take 600 mg by mouth 3 (three) times daily.   HYDROcodone-acetaminophen 5-325 MG tablet Commonly known as:  NORCO/VICODIN Take 1 tablet by mouth every 4 (four) hours as needed for moderate pain.   levothyroxine 50 MCG tablet Commonly known as:  SYNTHROID, LEVOTHROID Take 1 tablet (50 mcg total) by mouth daily before breakfast. Start taking on:  03/10/2018 What changed:    medication strength  how much to take   lip balm ointment Apply topically as needed for lip care.   phenazopyridine 200 MG tablet Commonly known as:  PYRIDIUM Take 1 tablet (200 mg total) by mouth 3 (three) times daily.   pravastatin 40 MG tablet Commonly known as:  PRAVACHOL Take 40 mg by mouth daily.   senna-docusate 8.6-50 MG tablet Commonly known as:  Senokot-S Take 1 tablet by mouth 2 (two) times daily as needed for mild constipation.   VALTREX 1000 MG tablet Generic drug:  valACYclovir Take 1,000 mg by mouth 2 (two) times daily. X 7 DAYS, THEN 500 MG BID X 6 MONTHS      Follow-up Information    [facility], [place], [place]. Go on 03/30/2018.   Specialty:  Cardiology Why:  _0 :30am for hospital folllow up Contact information: [address] [place] [address] [phone]          Allergies  Allergen Reactions  . Codeine Nausea And Vomiting    "made my head feel funny too"   Consultations:  Cardiology  Discussed Case with Urology Dr. Gloriann Loan  Procedures/Studies: Dg Chest 2 View  Result Date: 03/05/2018 CLINICAL DATA:  Vomiting and fever since placement of a urinary stent 03/03/2018. EXAM: CHEST - 2 VIEW COMPARISON:  PA and lateral chest 11/30/2017 and 02/10/2016. FINDINGS: The lungs are clear.  Heart size is normal. No pneumothorax or pleural fluid. No acute or focal bony abnormality. IMPRESSION: Negative chest. Electronically Signed   By: Inge Rise M.D.   On: 03/05/2018 14:56   Dg Abd 1 View  Result Date: 03/05/2018 CLINICAL DATA:  History of ureteral stent placement 03/03/2018. Onset abdominal pain, dizziness, nausea and chills developing after the procedure. EXAM: ABDOMEN - 1 VIEW COMPARISON:  CT abdomen and pelvis 02/08/2018.  KUB 06/29/2017. FINDINGS:  The bowel gas pattern is normal. Left double-J ureteral stent projects in good position. 0.8 cm in diameter calcification in the right upper quadrant correlates with a nonobstructing right renal stone which is seen on the comparison examinations. IMPRESSION: No acute finding. Left double-J stent projects in good position. No change in a 0.8 cm nonobstructing stone lower pole right kidney. Electronically Signed   By: Inge Rise M.D.   On: 03/05/2018 16:16   US Renal  Result Date: 03/05/2018 CLINICAL DATA:  Recent ureteral stent placement, fever EXAM: RENAL / URINARY TRACT ULTRASOUND COMPLETE COMPARISON:  CT abdomen and pelvis 02/08/2018 FINDINGS: Right Kidney: Length: 13.5 cm. Normal cortical thickness and echogenicity. Small cyst lateral RIGHT kidney 15 x 16 x 19 mm. Additional tiny cyst laterally at inferior pole 8 x 6 x 7 mm. No solid mass, hydronephrosis or shadowing calcification. Left Kidney: Length: 15.0 cm. Normal cortical thickness and echogenicity. No mass, hydronephrosis or shadowing calcification. Stent visualized at the renal pelvis. Bladder: Uterus stent visualized. Bladder well distended without mass or wall thickening. Ureteral jets visualized. IMPRESSION: Small RIGHT renal cysts. LEFT ureteral stent without hydronephrosis. Electronically Signed   By: Lavonia Dana M.D.   On: 03/05/2018 17:21    ECHOCARDIOGRAM on 03/06/18  ------------------------------------------------------------------- Study Conclusions  -  Left ventricle: The cavity size was normal. Wall thickness was   increased in a pattern of mild LVH. Systolic function was normal.   The estimated ejection fraction was in the range of 55% to 60%.   Although no diagnostic regional wall motion abnormality was   identified, this possibility cannot be completely excluded on the   basis of this study. The study was not technically sufficient to   allow evaluation of LV diastolic dysfunction due to atrial   fibrillation. - Aortic valve: Trileaflet; moderately calcified leaflets.   Sclerosis without stenosis. - Aorta: Mildly dilated aortic root. Aortic root dimension: 37 mm   (ED). - Mitral valve: Mildly calcified annulus. There was trivial   regurgitation. - Left atrium: The atrium was mildly dilated. - Right ventricle: The cavity size was normal. Systolic function   was normal. - Right atrium: The atrium was mildly dilated. - Tricuspid valve: Peak RV-RA gradient (S): 17 mm Hg. - Pulmonary arteries: PA peak pressure: 20 mm Hg (S). - Inferior vena cava: The vessel was normal in size. The   respirophasic diameter changes were in the normal range (>= 50%),   consistent with normal central venous pressure.  Impressions:  - The patient was in atrial fibrillation. Normal LV size with mild   LV hypertrophy. EF 55-60%. Normal RV size and systolic function.   No significant valvular abnormalities.  Subjective: He was feeling better.  No chest pain, lightheadedness or dizziness.  No nausea or vomiting but no other concerns at this time and was ready to go home.  Discharge Exam: Vitals:   03/08/18 2021 03/09/18 0450  BP: 123/83 114/76  Pulse: 72 68  Resp: 20 20  Temp: 98.2 F (36.8 C) 98.2 F (36.8 C)  SpO2: 98% 94%   Vitals:   03/08/18 0914 03/08/18 1353 03/08/18 2021 03/09/18 0450  BP: 129/85 125/71 123/83 114/76  Pulse: (!) 45 70 72 68  Resp: _0 Temp: 97.8 F (36.6 C) 98.2 F (36.8 C) 98.2 F (36.8 C) 98.2 F (36.8  C)  TempSrc: Oral Oral Oral Oral  SpO2: 99% 97% 98% 94%  Weight:      Height:  General: Pt is alert, awake, not in acute distress Cardiovascular: RRR, S1/S2 +, no rubs, no gallops Respiratory: CTA bilaterally, no wheezing, no rhonchi Abdominal: Soft, NT, ND, bowel sounds + Extremities: no edema, no cyanosis  The results of significant diagnostics from this hospitalization (including imaging, microbiology, ancillary and laboratory) are listed below for reference.    Microbiology: Recent Results (from the past 240 hour(s))  Blood Culture (routine x 2)     Status: None (Preliminary result)   Collection Time: 03/05/18  1:43 PM  Result Value Ref Range Status   Specimen Description   Final    BLOOD RIGHT ANTECUBITAL Performed at [facility] [address] [place], [place] [address]    Special Requests   Final    BOTTLES DRAWN AEROBIC AND ANAEROBIC Blood Culture adequate volume Performed at [facility] [address], [place], [place] [address]    Culture   Final    NO GROWTH 4 DAYS Performed at [facility], [place] [address] [place], [place] [address]    Report Status PENDING  Incomplete  Urine culture     Status: None   Collection Time: 03/05/18  1:43 PM  Result Value Ref Range Status   Specimen Description   Final    URINE, RANDOM Performed at [facility] [address], [place], [place] [address]    Special Requests   Final    NONE Performed at [facility], [place] [address] [place], [place] [address]    Culture   Final    NO GROWTH Performed at [facility], [place] [address], [place], [place] [address]    Report Status 03/06/2018 FINAL  Final  Blood Culture (routine x 2)     Status: None (Preliminary result)   Collection Time: 03/05/18  1:48 PM  Result Value Ref Range Status   Specimen Description   Final    BLOOD LEFT ARM Performed at [facility] [address] [place], [place] [address]    Special Requests   Final    BOTTLES DRAWN AEROBIC AND ANAEROBIC Blood Culture adequate volume Performed at [facility] [address], [place], [place] [address]    Culture   Final    NO GROWTH 4 DAYS Performed at [facility], [place] [address], [place], [place] [address]    Report Status PENDING  Incomplete  MRSA PCR Screening     Status: None   Collection Time: 03/06/18 10:33 AM  Result Value Ref Range Status   MRSA by PCR NEGATIVE NEGATIVE Final    Comment:        The GeneXpert MRSA Assay (FDA approved for NASAL specimens only), is one component of a comprehensive MRSA colonization surveillance program. It is not intended to diagnose MRSA infection nor to guide or monitor treatment for MRSA infections. Performed at [facility], [place] [address], [place], [place] [address]     Labs: BNP (last 3 results) No results for input(s): BNP in the last 8760 hours. Basic Metabolic Panel: Recent Labs  Lab 03/05/18 1343 03/06/18 0507 03/07/18 0111 03/08/18 0328 03/09/18 0500  NA 140 139 138 141 138  K 3.2* 3.5 3.3* 4.1 3.8  CL 105 107 105 104 106  CO2 _0 GLUCOSE 117* 117* 109* 111* 107*  BUN 35* _1 CREATININE 1.63* 1.00 0.85 0.98 0.78  CALCIUM 8.7* 8.0*  8.4* 8.8* 8.5*  MG  --  1.8 1.8 1.9 2.0  PHOS  --   --   --   --  3.0   Liver Function Tests: Recent Labs  Lab 03/05/18 1343 03/09/18 0500  AST 25 17  ALT 22 20  ALKPHOS 64 55  BILITOT 0.9 0.8  PROT 7.1 6.2*  ALBUMIN 3.7 3.1*   No results for input(s): LIPASE, AMYLASE in the last 168 hours. No results for input(s): AMMONIA in the last 168 hours. CBC: Recent Labs  Lab 03/05/18 1343 03/06/18 0507 03/07/18 0111 03/08/18 0328 03/09/18 0500  WBC 13.3* 6.4 5.6 6.1 6.7  NEUTROABS 11.7* 4.8 3.5 3.5 4.2  HGB 15.6 12.6* 14.0 14.4 13.9  HCT 45.6 38.0* 42.2 43.7 41.8  MCV 90.1 90.3 90.4 89.5 89.9  PLT  209 166 182 206 232   Cardiac Enzymes: No results for input(s): CKTOTAL, CKMB, CKMBINDEX, TROPONINI in the last 168 hours. BNP: Invalid input(s): POCBNP CBG: No results for input(s): GLUCAP in the last 168 hours. D-Dimer No results for input(s): DDIMER in the last 72 hours. Hgb A1c No results for input(s): HGBA1C in the last 72 hours. Lipid Profile No results for input(s): CHOL, HDL, LDLCALC, TRIG, CHOLHDL, LDLDIRECT in the last 72 hours. Thyroid function studies No results for input(s): TSH, T4TOTAL, T3FREE, THYROIDAB in the last 72 hours.  Invalid input(s): FREET3 Anemia work up No results for input(s): VITAMINB12, FOLATE, FERRITIN, TIBC, IRON, RETICCTPCT in the last 72 hours. Urinalysis    Component Value Date/Time   COLORURINE YELLOW 03/05/2018 1343   APPEARANCEUR CLEAR 03/05/2018 1343   LABSPEC 1.019 03/05/2018 1343   PHURINE 6.0 03/05/2018 1343   GLUCOSEU NEGATIVE 03/05/2018 1343   HGBUR LARGE (A) 03/05/2018 1343   BILIRUBINUR NEGATIVE 03/05/2018 1343   KETONESUR NEGATIVE 03/05/2018 1343   PROTEINUR 100 (A) 03/05/2018 1343   UROBILINOGEN 0.2 06/02/2014 0745   NITRITE NEGATIVE 03/05/2018 1343   LEUKOCYTESUR SMALL (A) 03/05/2018 1343   Sepsis Labs Invalid input(s): PROCALCITONIN,  WBC,  LACTICIDVEN Microbiology Recent Results (from the past 240 hour(s))  Blood Culture (routine x 2)     Status: None (Preliminary result)   Collection Time: 03/05/18  1:43 PM  Result Value Ref Range Status   Specimen Description   Final    BLOOD RIGHT ANTECUBITAL Performed at [facility], [place] [address] [place], [place] [address]    Special Requests   Final    BOTTLES DRAWN AEROBIC AND ANAEROBIC Blood Culture adequate volume Performed at [facility] [address], [place], [place] [address]    Culture   Final    NO GROWTH 4 DAYS Performed at [facility], [place] [address], [place], [place] [address]    Report Status PENDING   Incomplete  Urine culture     Status: None   Collection Time: 03/05/18  1:43 PM  Result Value Ref Range Status   Specimen Description   Final    URINE, RANDOM Performed at [facility] [address], [place], [place] [address]    Special Requests   Final    NONE Performed at [facility], [place] [address] [place], [place] [address]    Culture   Final    NO GROWTH Performed at [facility], [place] [address], [place],  [address]    Report Status 03/06/2018 FINAL  Final  Blood Culture (routine x 2)     Status: None (Preliminary result)  Collection Time: 03/05/18  1:48 PM  Result Value Ref Range Status   Specimen Description   Final    BLOOD LEFT ARM Performed at [facility] [address] [place], [place] [address]    Special Requests   Final    BOTTLES DRAWN AEROBIC AND ANAEROBIC Blood Culture adequate volume Performed at [facility] [address], [place], [place] [address]    Culture   Final    NO GROWTH 4 DAYS Performed at [facility], [place] [address], [place], [place] [address]    Report Status PENDING  Incomplete  MRSA PCR Screening     Status: None   Collection Time: 03/06/18 10:33 AM  Result Value Ref Range Status   MRSA by PCR NEGATIVE NEGATIVE Final    Comment:        The GeneXpert MRSA Assay (FDA approved for NASAL specimens only), is one component of a comprehensive MRSA colonization surveillance program. It is not intended to diagnose MRSA infection nor to guide or monitor treatment for MRSA infections. Performed at [facility], [place] [address] [place],  [address]    Time coordinating discharge: 35 minutes  SIGNED:  Kerney Elbe, DO [facility] 03/09/2018, 4:50 PM Pager is on [place]  If 7PM-7AM, please contact night-coverage [email or web address] Password [email or web address]

## 2018-03-09 NOTE — Evaluation (Signed)
Physical Therapy Evaluation-1x Patient Details Name: Marvin Gill MRN: [id number] DOB: 12-05-1950 Today's Date: 03/09/2018   History of Present Illness  67 yo male admitted with sepsis, Afib. Hx of AAA, BPH, CAD, fibromyalgia, ureteral stent placement 03/03/18  Clinical Impression  On eval, pt was Mod Ind with mobility. He walked ~250 feet without a device. Mildly unsteady at times but no overt LOB. Pt denied lightheadedness/dizziness. No acute PT needs at this time. 1x eval. Will sign off.     Follow Up Recommendations No PT follow up;Supervision - Intermittent    Equipment Recommendations  None recommended by PT    Recommendations for Other Services       Precautions / Restrictions Precautions Precautions: Fall Restrictions Weight Bearing Restrictions: No      Mobility  Bed Mobility Overal bed mobility: Independent                Transfers Overall transfer level: Independent                  Ambulation/Gait Ambulation/Gait assistance: Modified independent (Device/Increase time) Gait Distance (Feet): 250 Feet Assistive device: None Gait Pattern/deviations: Drifts right/left     General Gait Details: Noted some drifting with head turns/scanning of environment. Fair gait speed.   Stairs            Wheelchair Mobility    Modified Rankin (Stroke Patients Only)       Balance Overall balance assessment: Mild deficits observed, not formally tested                                           Pertinent Vitals/Pain Pain Assessment: No/denies pain    Home Living Family/patient expects to be discharged to:: Private residence Living Arrangements: Children Available Help at Discharge: Family;Available PRN/intermittently Type of Home: House Home Access: Level entry       Home Equipment: None      Prior Function Level of Independence: Independent               Hand Dominance        Extremity/Trunk Assessment    Upper Extremity Assessment Upper Extremity Assessment: Overall WFL for tasks assessed    Lower Extremity Assessment Lower Extremity Assessment: Overall WFL for tasks assessed    Cervical / Trunk Assessment Cervical / Trunk Assessment: Normal  Communication   Communication: No difficulties  Cognition Arousal/Alertness: Awake/alert Behavior During Therapy: WFL for tasks assessed/performed Overall Cognitive Status: Within Functional Limits for tasks assessed                                        General Comments      Exercises     Assessment/Plan    PT Assessment Patent does not need any further PT services  PT Problem List         PT Treatment Interventions      PT Goals (Current goals can be found in the Care Plan section)  Acute Rehab PT Goals Patient Stated Goal: home PT Goal Formulation: All assessment and education complete, DC therapy    Frequency     Barriers to discharge        Co-evaluation               AM-PAC PT "6 Clicks" Daily  Activity  Outcome Measure Difficulty turning over in bed (including adjusting bedclothes, sheets and blankets)?: None Difficulty moving from lying on back to sitting on the side of the bed? : None Difficulty sitting down on and standing up from a chair with arms (e.g., wheelchair, bedside commode, etc,.)?: None Help needed moving to and from a bed to chair (including a wheelchair)?: None Help needed walking in hospital room?: None Help needed climbing 3-5 steps with a railing? : None 6 Click Score: 24    End of Session   Activity Tolerance: Patient tolerated treatment well Patient left: in bed;with call bell/phone within reach        Time: 1140-1154 PT Time Calculation (min) (ACUTE ONLY): 14 min   Charges:   PT Evaluation $PT Eval Low Complexity: [place], PT Acute Rehabilitation Services Pager: [phone] Office: [phone]

## 2018-03-10 LAB — CULTURE, BLOOD (ROUTINE X 2)
CULTURE: NO GROWTH
Culture: NO GROWTH
SPECIAL REQUESTS: ADEQUATE
Special Requests: ADEQUATE

## 2018-03-15 ENCOUNTER — Telehealth: Payer: Self-pay

## 2018-03-15 NOTE — Telephone Encounter (Signed)
Follow Up:    Pt calling again, said he can not take the medicine, he would like something else to take please.

## 2018-03-15 NOTE — Telephone Encounter (Signed)
Patient called to inform us that he was admitted to the hospital last week for 5-6 days.  He was put on Diltiazem 180 mg for his HR and Afib.  Since last Friday, he has felt weak, dizzy and has absolutely no strength.  Please advise, thank you.

## 2018-03-15 NOTE — Telephone Encounter (Signed)
Spoke to patient and informed him that Dr Harrington Challenger would like for him to come in for an Medon on 10/17.  I have arranged an appt @ 11:30am to evaluate rhythm and BP.

## 2018-03-15 NOTE — Telephone Encounter (Signed)
Patient needs to be seen in clinic    Have BP taken, possible EKG He came in with question sepsis   He was orthostatic   IN afib Needs appt asap with APP tomorrow or go to primary care

## 2018-03-16 ENCOUNTER — Ambulatory Visit: Payer: Medicare Other | Admitting: Physician Assistant

## 2018-03-16 ENCOUNTER — Encounter: Payer: Self-pay | Admitting: Physician Assistant

## 2018-03-16 VITALS — BP 130/60 | HR 80 | Ht 78.0 in | Wt 204.8 lb

## 2018-03-16 DIAGNOSIS — R319 Hematuria, unspecified: Secondary | ICD-10-CM

## 2018-03-16 DIAGNOSIS — E785 Hyperlipidemia, unspecified: Secondary | ICD-10-CM | POA: Diagnosis not present

## 2018-03-16 DIAGNOSIS — I951 Orthostatic hypotension: Secondary | ICD-10-CM

## 2018-03-16 DIAGNOSIS — I2510 Atherosclerotic heart disease of native coronary artery without angina pectoris: Secondary | ICD-10-CM | POA: Diagnosis not present

## 2018-03-16 DIAGNOSIS — I480 Paroxysmal atrial fibrillation: Secondary | ICD-10-CM | POA: Diagnosis not present

## 2018-03-16 NOTE — Progress Notes (Signed)
Cardiology Office Note    Date:  03/16/2018   ID:  Marvin Gill, DOB 05-01-1951, MRN [id number]  PCP:  Marvin Orn, MD  Cardiologist:  Marvin Gill  Chief Complaint: Hospital follow up   History of Present Illness:   Marvin Gill is a 67 y.o. male with a hx of CAD s/p PTCA ofdLAD 2000, 3.1cm AAA ([place] 07/2016), fibromyalgia, anxiety, GERD, recent ureteral stenting/hematuria, recent ~20-25lb unintentional weight losspresents for follow up.   Marvin Gill has not been seen by cardiology for a long time, citing that he's done well so didn't see the need to f/u. Last PCI was in 2000 and last cath 2003 showed normal coronaries and patent PTCA site. Echo in 2007 showed normal LVEF. Nuclear stress test scanned in from 2010 showed abnormal EKG positive for ischemia, but nuclear imaging showed diaphragmatic attenuation without evidence for ischemia or infarct, EF 60%.   Admitted earlier this month with newly recognized atrial fibrillation/flutter in setting of possible sepsis. ? Transient Bacteremia from a Urinary Source insetting of recent ureteral stent placement, improved.  Empirically started on cefepime and Flagy. Echocardiogram showed no wall motion abnormality. EF 55 to 60%. Converted to NSR on cardizem. Started Eliquis for stoke prevention. CHADSVASc score of 2. AKI improved with hydration. Also increased synthroid.   Here today for follow up.  Patient had a dizziness intermittently post discharge which is resolved after discontinuation of diltiazem.  No recurrence.  He continues to have hematuria on Eliquis.  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  May be upcoming surgery.  He denies chest pain, palpitation, shortness of breath, orthopnea, PND, syncope, dizziness, lower extremity edema or blood in his stool.  Maintaining sinus rhythm.   Past Medical History:  Diagnosis Date  . Aneurysm of abdominal aorta (HCC)    a. 08-04-16 3.1 CM X 3.1 CM - sees Dr. Laurann Gill -  F/u due 2021.  Marland Kitchen Anxiety   . Arthritis    back  . Benign localized prostatic hyperplasia with lower urinary tract symptoms (LUTS)   . Complication of anesthesia   . Coronary artery disease 2000   a. s/p PTCA to dLAD 2000. b. Normal coronaries in 2003, patent prior PTCA site.  . Fibromyalgia   . GERD (gastroesophageal reflux disease)   . History of skin cancer   . Hypothyroidism   . PONV (postoperative nausea and vomiting)    NAUSEA ALL DAY AFTER 02-01-17 SURGERY, PT WANTS ANESTHESIA LIKE 06-23-15 SURGERY WITH DR  Marvin Gill  . S/P percutaneous transluminal coronary angioplasty    05-18-1999  to dLAD    Past Surgical History:  Procedure Laterality Date  . CARDIAC CATHETERIZATION  06-14-2001  dr Marvin Gill   normal coronary arteries and patent previous dLAD   . COLONOSCOPY WITH PROPOFOL N/A 12/13/2013   Procedure: COLONOSCOPY WITH PROPOFOL;  Surgeon: Marvin Fair, MD;  Location: [facility];  Service: Endoscopy;  Laterality: N/A;  . CORONARY ANGIOPLASTY  05-18-1999  dr Marvin Gill   PTCA balloon to dLAD 80% (single vessel disease),  ef 60%  . CYSTOSCOPY WITH FULGERATION N/A 08/15/2017   Procedure: CYSTOSCOP/ BLADDER BIOPSY/TURBT;  Surgeon: Marvin Retort, MD;  Location[facility];  Service: Urology;  Laterality: N/A;  . CYSTOSCOPY WITH INSERTION OF UROLIFT N/A 06/23/2015   Procedure: CYSTOSCOPY WITH INSERTION OF UROLIFT;  Surgeon: Marvin Clines, MD;  Location: [facility];  Service: Urology;  Laterality: N/A;  . CYSTOSCOPY/URETEROSCOPY/HOLMIUM LASER/STENT PLACEMENT Bilateral 02/01/2017   Procedure: CYSTOSCOPY/URETEROSCOPY/HOLMIUM  LASER/STENT PLACEMENT,STONE BASKETRY, BLADDER BIOPSY;  Surgeon: Marvin Retort, MD;  Location: [facility];  Service: Urology;  Laterality: Bilateral;  . ESOPHAGOGASTRODUODENOSCOPY  08-24-2006  . TRANSURETHRAL INCISION OF PROSTATE N/A 04/01/2014   Procedure: TRANSURETHRAL INCISION OF THE PROSTATE (TUIP);  Surgeon: Marvin Rud, MD;  Location: [facility];  Service: Urology;  Laterality: N/A;    Current Medications: Prior to Admission medications   Medication Sig Start Date End Date Taking? Authorizing Provider  ALPRAZolam Duanne Moron) 1 MG tablet Take 1 mg by mouth 2 (two) times daily.     [provider]  apixaban (ELIQUIS) 5 MG TABS tablet Take 1 tablet (5 mg total) by mouth 2 (two) times daily. 03/09/18   Marvin Noble Latif, DO  diltiazem (CARDIZEM CD) 180 MG 24 hr capsule Take 1 capsule (180 mg total) by mouth daily. 03/09/18   Marvin Noble Latif, DO  esomeprazole (NEXIUM) 40 MG capsule Take 40 mg by mouth every morning.     [provider]  feeding supplement, ENSURE ENLIVE, (ENSURE ENLIVE) LIQD Take 237 mLs by mouth 2 (two) times daily between meals. 03/09/18   Sheikh, Omair Latif, DO  gabapentin (NEURONTIN) 600 MG tablet Take 600 mg by mouth 3 (three) times daily.  05/11/15   [provider]  HYDROcodone-acetaminophen (NORCO) 5-325 MG tablet Take 1 tablet by mouth every 4 (four) hours as needed for moderate pain. Patient not taking: Reported on 03/05/2018 08/15/17   Marvin Retort, MD  levothyroxine (SYNTHROID, LEVOTHROID) 50 MCG tablet Take 1 tablet (50 mcg total) by mouth daily before breakfast. 03/10/18   Marvin Noble Latif, DO  lip balm (CARMEX) ointment Apply topically as needed for lip care. 03/09/18   Marvin Noble Latif, DO  phenazopyridine (PYRIDIUM) 200 MG tablet Take 1 tablet (200 mg total) by mouth 3 (three) times daily. Patient not taking: Reported on 03/05/2018 01/29/18   Marvin Heady, PA-C  pravastatin (PRAVACHOL) 40 MG tablet Take 40 mg by mouth daily.  05/07/15   [provider]  senna-docusate (SENOKOT-S) 8.6-50 MG tablet Take 1 tablet by mouth 2 (two) times daily as needed for mild constipation. 03/09/18   Sheikh, Omair Latif, DO  valACYclovir (VALTREX) 1000 MG tablet Take 1,000 mg by mouth 2 (two) times daily. X 7 DAYS, THEN 500 MG  BID X 6 MONTHS    [provider]   Allergies:   Codeine   Social History   Socioeconomic History  . Marital status: Divorced    Spouse name: Not on file  . Number of children: Not on file  . Years of education: Not on file  . Highest education level: Not on file  Occupational History  . Not on file  Social Needs  . Financial resource strain: Not on file  . Food insecurity:    Worry: Not on file    Inability: Not on file  . Transportation needs:    Medical: Not on file    Non-medical: Not on file  Tobacco Use  . Smoking status: Former Smoker    Packs/day: 1.00    Years: 25.00    Pack years: 25.00    Types: Cigarettes    Last attempt to quit: 06/01/2003    Years since quitting: 14.8  . Smokeless tobacco: Never Used  Substance and Sexual Activity  . Alcohol use: No    Frequency: Never  . Drug use: No  . Sexual activity: Not on file  Lifestyle  . Physical activity:  Days per week: Not on file    Minutes per session: Not on file  . Stress: Not on file  Relationships  . Social connections:    Talks on phone: Not on file    Gets together: Not on file    Attends religious service: Not on file    Active member of club or organization: Not on file    Attends meetings of clubs or organizations: Not on file    Relationship status: Not on file  Other Topics Concern  . Not on file  Social History Narrative  . Not on file     Family History:  The patient's family history includes Anuerysm in his mother.   ROS:   Please see the history of present illness.    ROS All other systems reviewed and are negative.   PHYSICAL EXAM:   VS:  BP 130/60   Pulse 80   Ht 6\' 6"  (1.981 m)   Wt 204 lb 12.8 oz (92.9 kg)   SpO2 98%   BMI 23.67 kg/m    GEN: Well nourished, well developed, in no acute distress  HEENT: normal  Neck: no JVD, carotid bruits, or masses Cardiac:RRR; no murmurs, rubs, or gallops,no edema  Respiratory:  clear to auscultation bilaterally, normal  work of breathing GI: soft, nontender, nondistended, + BS MS: no deformity or atrophy  Skin: warm and dry, no rash Neuro:  Alert and Oriented x 3, Strength and sensation are intact Psych: euthymic mood, full affect  Wt Readings from Last 3 Encounters:  03/16/18 204 lb 12.8 oz (92.9 kg)  03/05/18 205 lb (93 kg)  01/29/18 205 lb (93 kg)      Studies/Labs Reviewed:   EKG:  EKG is ordered today.  The ekg ordered today demonstrates normal sinus rhythm at rate of 75 bpm  Recent Labs: 03/06/2018: TSH 3.394 03/09/2018: ALT 20; BUN 17; Creatinine, Ser 0.78; Hemoglobin 13.9; Magnesium 2.0; Platelets 232; Potassium 3.8; Sodium 138   Lipid Panel    Component Value Date/Time   CHOL  05/10/2007 0305    158        ATP III CLASSIFICATION:  <200     mg/dL   Desirable  200-239  mg/dL   Borderline High  >=240    mg/dL   High   TRIG 86 05/10/2007 0305   HDL 35 (L) 05/10/2007 0305   CHOLHDL 4.5 05/10/2007 0305   VLDL 17 05/10/2007 0305   LDLCALC (H) 05/10/2007 0305    106        Total Cholesterol/HDL:CHD Risk Coronary Heart Disease Risk Table                     Men   Women  1/2 Average Risk   3.4   3.3    Additional studies/ records that were reviewed today include:   Echocardiogram:03/06/18 Study Conclusions  - Left ventricle: The cavity size was normal. Wall thickness was   increased in a pattern of mild LVH. Systolic function was normal.   The estimated ejection fraction was in the range of 55% to 60%.   Although no diagnostic regional wall motion abnormality was   identified, this possibility cannot be completely excluded on the   basis of this study. The study was not technically sufficient to   allow evaluation of LV diastolic dysfunction due to atrial   fibrillation. - Aortic valve: Trileaflet; moderately calcified leaflets.   Sclerosis without stenosis. - Aorta: Mildly dilated aortic root.  Aortic root dimension: 37 mm   (ED). - Mitral valve: Mildly calcified annulus.  There was trivial   regurgitation. - Left atrium: The atrium was mildly dilated. - Right ventricle: The cavity size was normal. Systolic function   was normal. - Right atrium: The atrium was mildly dilated. - Tricuspid valve: Peak RV-RA gradient (S): 17 mm Hg. - Pulmonary arteries: PA peak pressure: 20 mm Hg (S). - Inferior vena cava: The vessel was normal in size. The   respirophasic diameter changes were in the normal range (>= 50%),   consistent with normal central venous pressure.  Impressions:  - The patient was in atrial fibrillation. Normal LV size with mild   LV hypertrophy. EF 55-60%. Normal RV size and systolic function.   No significant valvular abnormalities.    ASSESSMENT & PLAN:    1. PAF -Maintaining sinus rhythm.  His dizziness resolved after discontinuation of Cardizem.  No recurrence.  He denies palpitation or syncope.  No need for rate control medication currently.  Continue Eliquis for stroke prevention.  Chads vas score of 2.  2. CAD -Remote history of angioplasty.  No angina.  Not on aspirin due to need of anticoagulation.  3. HLD - Followed by PCP  4.  Ureteral stent/hematuria -Ongoing hematuria.  This is ongoing prior to starting Eliquis.  No change.  He might have upcoming surgery for questionable cancer to ureteral stent.  No preop clearance has received so far.  He will be clear at acceptable risk if required surgery.  No cardiac symptoms currently.  He will need phone call.  Anticoagulation per Dr. Reche Dixon.   Medication Adjustments/Labs and Tests Ordered: Current medicines are reviewed at length with the patient today.  Concerns regarding medicines are outlined above.  Medication changes, Labs and Tests ordered today are listed in the Patient Instructions below. Patient Instructions  Medication Instructions:  Your physician recommends that you continue on your current medications as directed. Please refer to the Current Medication list  given to you today.  If you need a refill on your cardiac medications before your next appointment, please call your pharmacy.   Lab work: None ordered  If you have labs (blood work) drawn today and your tests are completely normal, you will receive your results only by: Marland Kitchen MyChart Message (if you have MyChart) OR . A paper copy in the mail If you have any lab test that is abnormal or we need to change your treatment, we will call you to review the results.  Testing/Procedures: None ordered  Follow-Up: At [facility], you and your health needs are our priority.  As part of our continuing mission to provide you with exceptional heart care, we have created designated Provider Care Teams.  These Care Teams include your primary Cardiologist (physician) and Advanced Practice Providers (APPs -  Physician Assistants and Nurse Practitioners) who all work together to provide you with the care you need, when you need it. Marland Kitchen 2-3 MONTHS WITH DR. ROSS  Any Other Special Instructions Will Be Listed Below (If Applicable).       Jarrett Soho, [place]  03/16/2018 11:53 AM    [facility] [address], [place], [place]  [address] Phone: [phone]; Fax: [phone]

## 2018-03-16 NOTE — Patient Instructions (Signed)
Medication Instructions:  Your physician recommends that you continue on your current medications as directed. Please refer to the Current Medication list given to you today.  If you need a refill on your cardiac medications before your next appointment, please call your pharmacy.   Lab work: None ordered  If you have labs (blood work) drawn today and your tests are completely normal, you will receive your results only by: Marland Kitchen MyChart Message (if you have MyChart) OR . A paper copy in the mail If you have any lab test that is abnormal or we need to change your treatment, we will call you to review the results.  Testing/Procedures: None ordered  Follow-Up: At [facility], you and your health needs are our priority.  As part of our continuing mission to provide you with exceptional heart care, we have created designated Provider Care Teams.  These Care Teams include your primary Cardiologist (physician) and Advanced Practice Providers (APPs -  Physician Assistants and Nurse Practitioners) who all work together to provide you with the care you need, when you need it. Marland Kitchen 2-3 MONTHS WITH DR. ROSS  Any Other Special Instructions Will Be Listed Below (If Applicable).

## 2018-03-21 ENCOUNTER — Telehealth: Payer: Self-pay | Admitting: Internal Medicine

## 2018-03-21 NOTE — Telephone Encounter (Signed)
Pt calling to report he is tired, no energy and nausea. Not SOB. Really fatigued. Asks if can be r/t eliquis No vomiting No diarrhea.  Ate breakfast and has been drinking fluids today. No other food yet. Went to PCP, labs drawn, HR 56, BP 128/62, Temp 98.4.  No chills/sweats.  Worked yesterday but felt fatigued there. The more he moves the more blood he notices when urinates.   No clots. Urine is red.  Normal bowel movements. Continue to have hematuria and Is concerned about this.  Has contacted urology, waiting to hear back from them.  Advised his symptoms are not likely r/t Eliquis Expects to hear from PCP tomorrow re: lab results. Aware I will forward to Dr. Harrington Challenger to inform.

## 2018-03-21 NOTE — Telephone Encounter (Signed)
New message   Pt c/o medication issue:  1. Name of Medication:apixaban (ELIQUIS) 5 MG TABS tablet  2. How are you currently taking this medication (dosage and times per day)? Twice daily  3. Are you having a reaction (difficulty breathing--STAT)?no   4. What is your medication issue? Patient is nauseated, weak and lightheaded. Patient wants to know if any of these symptoms may be coming from this medication?

## 2018-03-22 NOTE — Telephone Encounter (Signed)
I am not convinced symptoms are related to Eliquis    Needs to have labs followed (CBC) Make sure pt has urology f/u and that urology is aware of hematuria

## 2018-03-27 NOTE — Telephone Encounter (Signed)
Left message for patient that Dr. Harrington Challenger does not feel like his symptoms are related to Eliquis. Adv that as long as labs were checked and he has follow up soon with urology and they area aware of hematuria that he should be able to follow up on this with them. Adv to call back if needs anything further.

## 2018-03-29 ENCOUNTER — Telehealth: Payer: Self-pay | Admitting: Physician Assistant

## 2018-03-29 NOTE — Telephone Encounter (Signed)
° °  [facility] Pre-operative Risk Assessment    Request for surgical clearance:  1. What type of surgery is being performed? 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 When is this surgery scheduled? Pending   What type of clearance is required (medical clearance vs. Pharmacy clearance to hold med vs. Both)?Cardiac Clearance- pt last ov here was 03-16-18 3. Are there any medications that need to be held prior to surgery and how long?Can pt hold Eliquis 2 to 3 days prior to procedure?   4. Practice name and name of physician performing surgery? Dr Harrell Gave [facility]   5. What is your office phone number [phone]     7.   What is your office fax [phone]  8.   Anesthesia type (None, local, MAC, general) ?  General 03/29/2018, 4:30 PM  _________________________________________________________________   (provider comments below)

## 2018-03-30 ENCOUNTER — Ambulatory Visit: Payer: Medicare Other | Admitting: Physician Assistant

## 2018-03-30 NOTE — Telephone Encounter (Signed)
   Primary Cardiologist: Dorris Carnes, MD  Chart reviewed as part of pre-operative protocol coverage. Given past medical history and time since last visit, based on ACC/AHA guidelines, Marvin Gill would be at acceptable risk for the planned procedure without further cardiovascular testing.   Pharmacy to review anticoagulation.   [place], [place] 03/30/2018, 2:17 PM

## 2018-03-31 NOTE — Telephone Encounter (Signed)
Patient with diagnosis of atrial fibrillation on Eliquis for anticoagulation.    Procedure: cytoscopy left ureteroscopy laser... Date of procedure: TBD  CHADS2-VASc score of  2 (, AGE,  CAD, )  CrCl 119.9 Platelet count 121  Per office protocol, patient can hold Eliquis for 3 days prior to procedure.

## 2018-04-04 ENCOUNTER — Encounter (HOSPITAL_BASED_OUTPATIENT_CLINIC_OR_DEPARTMENT_OTHER): Payer: Self-pay | Admitting: *Deleted

## 2018-04-04 ENCOUNTER — Other Ambulatory Visit: Payer: Self-pay | Admitting: Urology

## 2018-04-05 ENCOUNTER — Other Ambulatory Visit: Payer: Self-pay

## 2018-04-05 ENCOUNTER — Encounter (HOSPITAL_BASED_OUTPATIENT_CLINIC_OR_DEPARTMENT_OTHER): Payer: Self-pay | Admitting: *Deleted

## 2018-04-05 NOTE — Progress Notes (Addendum)
Spoke w/ pt via phone for pre-op interview.  Npo after mn w/ exception clear liquids until 0730 (no cream/ mild products).  Arrive at [facility].  Current EKG and lab results (dated 03-09-2018) in chart and epic.  Will take am meds dos w/ sips of water.  Cardiac telephone clearance , dated 03-29-2018 in chart and epic.

## 2018-04-07 ENCOUNTER — Ambulatory Visit (HOSPITAL_BASED_OUTPATIENT_CLINIC_OR_DEPARTMENT_OTHER)
Admission: RE | Admit: 2018-04-07 | Discharge: 2018-04-07 | Disposition: A | Discharge status: Home / Self Care | Payer: Medicare Other | Source: Ambulatory Visit | Attending: Urology | Admitting: Urology

## 2018-04-07 ENCOUNTER — Ambulatory Visit (HOSPITAL_BASED_OUTPATIENT_CLINIC_OR_DEPARTMENT_OTHER): Payer: Medicare Other | Admitting: Anesthesiology

## 2018-04-07 ENCOUNTER — Encounter (HOSPITAL_BASED_OUTPATIENT_CLINIC_OR_DEPARTMENT_OTHER)
Admission: RE | Disposition: A | Discharge status: Home / Self Care | Payer: Self-pay | Source: Ambulatory Visit | Attending: Urology

## 2018-04-07 ENCOUNTER — Encounter (HOSPITAL_BASED_OUTPATIENT_CLINIC_OR_DEPARTMENT_OTHER): Payer: Self-pay | Admitting: *Deleted

## 2018-04-07 ENCOUNTER — Other Ambulatory Visit: Payer: Self-pay

## 2018-04-07 DIAGNOSIS — Z87891 Personal history of nicotine dependence: Secondary | ICD-10-CM | POA: Insufficient documentation

## 2018-04-07 DIAGNOSIS — M1990 Unspecified osteoarthritis, unspecified site: Secondary | ICD-10-CM | POA: Diagnosis not present

## 2018-04-07 DIAGNOSIS — C676 Malignant neoplasm of ureteric orifice: Secondary | ICD-10-CM | POA: Diagnosis present

## 2018-04-07 DIAGNOSIS — K219 Gastro-esophageal reflux disease without esophagitis: Secondary | ICD-10-CM | POA: Diagnosis not present

## 2018-04-07 DIAGNOSIS — Z79899 Other long term (current) drug therapy: Secondary | ICD-10-CM | POA: Insufficient documentation

## 2018-04-07 DIAGNOSIS — I480 Paroxysmal atrial fibrillation: Secondary | ICD-10-CM | POA: Diagnosis not present

## 2018-04-07 DIAGNOSIS — Z7901 Long term (current) use of anticoagulants: Secondary | ICD-10-CM | POA: Diagnosis not present

## 2018-04-07 DIAGNOSIS — Z87442 Personal history of urinary calculi: Secondary | ICD-10-CM | POA: Diagnosis not present

## 2018-04-07 DIAGNOSIS — Z85828 Personal history of other malignant neoplasm of skin: Secondary | ICD-10-CM | POA: Diagnosis not present

## 2018-04-07 DIAGNOSIS — M797 Fibromyalgia: Secondary | ICD-10-CM | POA: Diagnosis not present

## 2018-04-07 DIAGNOSIS — E039 Hypothyroidism, unspecified: Secondary | ICD-10-CM | POA: Insufficient documentation

## 2018-04-07 DIAGNOSIS — F419 Anxiety disorder, unspecified: Secondary | ICD-10-CM | POA: Insufficient documentation

## 2018-04-07 HISTORY — DX: Personal history of urinary calculi: Z87.442

## 2018-04-07 HISTORY — DX: Paroxysmal atrial fibrillation: I48.0

## 2018-04-07 HISTORY — DX: Malignant neoplasm of bladder, unspecified: C67.9

## 2018-04-07 HISTORY — PX: CYSTOSCOPY WITH URETEROSCOPY AND STENT PLACEMENT: SHX6377

## 2018-04-07 SURGERY — CYSTOURETEROSCOPY, WITH STENT INSERTION
Anesthesia: General | Site: Bladder | Laterality: Left

## 2018-04-07 MED ORDER — KETOROLAC TROMETHAMINE 30 MG/ML IJ SOLN
INTRAMUSCULAR | Status: AC
  Filled 2018-04-07: qty 1

## 2018-04-07 MED ORDER — ONDANSETRON HCL 4 MG/2ML IJ SOLN
INTRAMUSCULAR | Status: DC | PRN
  Administered 2018-04-07: 8 mg via INTRAVENOUS

## 2018-04-07 MED ORDER — PHENAZOPYRIDINE HCL 200 MG PO TABS
200.0000 mg | ORAL_TABLET | Freq: Three times a day (TID) | ORAL | Status: DC
  Administered 2018-04-07: 200 mg via ORAL
  Filled 2018-04-07: qty 1

## 2018-04-07 MED ORDER — SCOPOLAMINE 1 MG/3DAYS TD PT72
1.0000 | MEDICATED_PATCH | TRANSDERMAL | Status: DC
  Administered 2018-04-07: 1.5 mg via TRANSDERMAL
  Filled 2018-04-07: qty 1

## 2018-04-07 MED ORDER — PROPOFOL 500 MG/50ML IV EMUL
INTRAVENOUS | Status: DC | PRN
  Administered 2018-04-07: 25 ug/kg/min via INTRAVENOUS

## 2018-04-07 MED ORDER — DEXAMETHASONE SODIUM PHOSPHATE 10 MG/ML IJ SOLN
INTRAMUSCULAR | Status: AC
  Filled 2018-04-07: qty 1

## 2018-04-07 MED ORDER — LIDOCAINE 2% (20 MG/ML) 5 ML SYRINGE
INTRAMUSCULAR | Status: AC
  Filled 2018-04-07: qty 5

## 2018-04-07 MED ORDER — PROPOFOL 10 MG/ML IV BOLUS
INTRAVENOUS | Status: DC | PRN
  Administered 2018-04-07: 150 mg via INTRAVENOUS

## 2018-04-07 MED ORDER — PROPOFOL 500 MG/50ML IV EMUL
INTRAVENOUS | Status: AC
  Filled 2018-04-07: qty 50

## 2018-04-07 MED ORDER — LACTATED RINGERS IV SOLN
INTRAVENOUS | Status: DC
  Administered 2018-04-07 (×2): via INTRAVENOUS
  Filled 2018-04-07: qty 1000

## 2018-04-07 MED ORDER — PHENAZOPYRIDINE HCL 100 MG PO TABS
ORAL_TABLET | ORAL | Status: AC
  Filled 2018-04-07: qty 2

## 2018-04-07 MED ORDER — CEFAZOLIN SODIUM-DEXTROSE 2-4 GM/100ML-% IV SOLN
2.0000 g | Freq: Once | INTRAVENOUS | Status: AC
  Administered 2018-04-07: 2 g via INTRAVENOUS
  Filled 2018-04-07: qty 100

## 2018-04-07 MED ORDER — ONDANSETRON HCL 4 MG/2ML IJ SOLN
INTRAMUSCULAR | Status: AC
  Filled 2018-04-07: qty 2

## 2018-04-07 MED ORDER — FENTANYL CITRATE (PF) 100 MCG/2ML IJ SOLN
INTRAMUSCULAR | Status: AC
  Filled 2018-04-07: qty 2

## 2018-04-07 MED ORDER — CEPHALEXIN 500 MG PO CAPS: 500.0000 mg | ORAL_CAPSULE | Freq: Two times a day (BID) | ORAL | 0 refills | Status: AC

## 2018-04-07 MED ORDER — PHENAZOPYRIDINE HCL 200 MG PO TABS: 200.0000 mg | ORAL_TABLET | Freq: Three times a day (TID) | ORAL | 0 refills | Status: DC | PRN

## 2018-04-07 MED ORDER — KETOROLAC TROMETHAMINE 30 MG/ML IJ SOLN
INTRAMUSCULAR | Status: DC | PRN
  Administered 2018-04-07: 30 mg via INTRAVENOUS

## 2018-04-07 MED ORDER — MIDAZOLAM HCL 5 MG/5ML IJ SOLN
INTRAMUSCULAR | Status: DC | PRN
  Administered 2018-04-07: 2 mg via INTRAVENOUS

## 2018-04-07 MED ORDER — LIDOCAINE HCL (CARDIAC) PF 100 MG/5ML IV SOSY
PREFILLED_SYRINGE | INTRAVENOUS | Status: DC | PRN
  Administered 2018-04-07: 100 mg via INTRAVENOUS

## 2018-04-07 MED ORDER — HYDROCODONE-ACETAMINOPHEN 5-325 MG PO TABS: 1.0000 | ORAL_TABLET | ORAL | 0 refills | Status: DC | PRN

## 2018-04-07 MED ORDER — ONDANSETRON HCL 4 MG PO TABS: 4.0000 mg | ORAL_TABLET | Freq: Every day | ORAL | 1 refills | Status: DC | PRN

## 2018-04-07 MED ORDER — SCOPOLAMINE 1 MG/3DAYS TD PT72
MEDICATED_PATCH | TRANSDERMAL | Status: AC
  Filled 2018-04-07: qty 1

## 2018-04-07 MED ORDER — DEXAMETHASONE SODIUM PHOSPHATE 4 MG/ML IJ SOLN
INTRAMUSCULAR | Status: DC | PRN
  Administered 2018-04-07: 10 mg via INTRAVENOUS

## 2018-04-07 MED ORDER — ACETAMINOPHEN 10 MG/ML IV SOLN
1000.0000 mg | Freq: Once | INTRAVENOUS | Status: DC | PRN
  Filled 2018-04-07: qty 100

## 2018-04-07 MED ORDER — METOCLOPRAMIDE HCL 5 MG/ML IJ SOLN
INTRAMUSCULAR | Status: AC
  Filled 2018-04-07: qty 2

## 2018-04-07 MED ORDER — CEFAZOLIN SODIUM-DEXTROSE 2-4 GM/100ML-% IV SOLN
INTRAVENOUS | Status: AC
  Filled 2018-04-07: qty 100

## 2018-04-07 MED ORDER — FENTANYL CITRATE (PF) 100 MCG/2ML IJ SOLN
25.0000 ug | INTRAMUSCULAR | Status: DC | PRN
  Administered 2018-04-07: 25 ug via INTRAVENOUS
  Filled 2018-04-07: qty 1

## 2018-04-07 MED ORDER — MIDAZOLAM HCL 2 MG/2ML IJ SOLN
INTRAMUSCULAR | Status: AC
  Filled 2018-04-07: qty 2

## 2018-04-07 MED ORDER — METOCLOPRAMIDE HCL 5 MG/ML IJ SOLN
INTRAMUSCULAR | Status: DC | PRN
  Administered 2018-04-07: 10 mg via INTRAVENOUS

## 2018-04-07 MED ORDER — FENTANYL CITRATE (PF) 100 MCG/2ML IJ SOLN
INTRAMUSCULAR | Status: DC | PRN
  Administered 2018-04-07: 25 ug via INTRAVENOUS
  Administered 2018-04-07: 50 ug via INTRAVENOUS
  Administered 2018-04-07: 25 ug via INTRAVENOUS

## 2018-04-07 SURGICAL SUPPLY — 27 items
BAG DRAIN URO-CYSTO SKYTR STRL (DRAIN) IMPLANT
BASKET STONE 1.7 NGAGE (UROLOGICAL SUPPLIES) IMPLANT
BASKET ZERO TIP NITINOL 2.4FR (BASKET) IMPLANT
BENZOIN TINCTURE PRP APPL 2/3 (GAUZE/BANDAGES/DRESSINGS) IMPLANT
CATH URET 5FR 28IN OPEN ENDED (CATHETERS) IMPLANT
CLOSURE WOUND 1/2 X4 (GAUZE/BANDAGES/DRESSINGS)
CLOTH BEACON ORANGE TIMEOUT ST (SAFETY) ×3 IMPLANT
FIBER LASER FLEXIVA 365 (UROLOGICAL SUPPLIES) IMPLANT
FIBER LASER TRAC TIP (UROLOGICAL SUPPLIES) ×3 IMPLANT
GLOVE BIO SURGEON STRL SZ7.5 (GLOVE) ×3 IMPLANT
GOWN STRL REUS W/TWL XL LVL3 (GOWN DISPOSABLE) ×3 IMPLANT
GUIDEWIRE STR DUAL SENSOR (WIRE) IMPLANT
GUIDEWIRE ZIPWRE .038 STRAIGHT (WIRE) ×6 IMPLANT
IV NS 1000ML (IV SOLUTION) ×2
IV NS 1000ML BAXH (IV SOLUTION) ×1 IMPLANT
IV NS IRRIG 3000ML ARTHROMATIC (IV SOLUTION) ×3 IMPLANT
KIT TURNOVER CYSTO (KITS) ×3 IMPLANT
MANIFOLD NEPTUNE II (INSTRUMENTS) ×3 IMPLANT
NS IRRIG 500ML POUR BTL (IV SOLUTION) IMPLANT
PACK CYSTO (CUSTOM PROCEDURE TRAY) ×3 IMPLANT
STENT URET 6FRX26 CONTOUR (STENTS) ×3 IMPLANT
STRIP CLOSURE SKIN 1/2X4 (GAUZE/BANDAGES/DRESSINGS) IMPLANT
SYR 10ML LL (SYRINGE) ×3 IMPLANT
TUBE CONNECTING 12'X1/4 (SUCTIONS) ×1
TUBE CONNECTING 12X1/4 (SUCTIONS) ×2 IMPLANT
TUBING UROLOGY SET (TUBING) ×3 IMPLANT
WATER STERILE IRR 3000ML UROMA (IV SOLUTION) ×3 IMPLANT

## 2018-04-07 NOTE — H&P (Signed)
Urology Preoperative H&P   Chief Complaint: Ureteral tumor  History of Present Illness: Marvin Gill is a 67 y.o. male , referred by Dr. 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 He also has a history of kidney stones, ED, chronic orchalgia and BPH with lower urinary tract symptoms. The patient was diagnosed with A-fib following his ureteroscopy on 03/03/18 and was recently started on Eliquis (followed by Dr. Dorris Carnes)   CT urogram (02/08/2018) showed multiple areas of urothelial enhancement in the distal third of the left ureter, most pronounced at the left UVJ. This is associated with mild proximal left hydronephrosis. He is also noted to have multiple nonobstructive calculi in both kidneys, the largest of which measured approximately 11 mm in the lower pole right kidney.   Per Dr. Purvis Sheffield operative note, there was no evidence of proximal ureteral disease and no filling defects were identified within the left renal pelvis on RPG. Flexible ureteroscopy of the left renal pelvis was not performed.   Today, the patient reports on-going, dull left sided flank pain with radiation into the left testicle as well as dysuria and intermittent episodes of hematuria. He denies N/V/F/C.    Past Medical History:  Diagnosis Date  . AF (paroxysmal atrial fibrillation) ([place]) 03/05/2018   new dx in settin sepsis  . Aneurysm of abdominal aorta (HCC)    a. ultasound 08-04-16 --- 3.1 CM X 3.1 CM - sees Dr. Laurann Montana - F/u due 2021.  Marland Kitchen Anxiety   . Arthritis    back  . Benign localized prostatic hyperplasia with lower urinary tract symptoms (LUTS)   . Bladder cancer ([place])   . Coronary artery disease primary cardiologist-- dr Dorris Carnes   a. s/p PTCA to dLAD 2000. b. Normal coronaries in 2003, patent prior PTCA site.  . Fibromyalgia   . GERD (gastroesophageal reflux disease)   . History of kidney stones   . History of skin cancer   .  Hypothyroidism   . PONV (postoperative nausea and vomiting)    NAUSEA ALL DAY AFTER 02-01-17 SURGERY, PT WANTS ANESTHESIA LIKE 06-23-15 SURGERY WITH DR  [name]  . S/P percutaneous transluminal coronary angioplasty    05-18-1999  to dLAD  . Wears glasses     Past Surgical History:  Procedure Laterality Date  . CARDIAC CATHETERIZATION  06-14-2001  dr Einar Gip   normal coronary arteries and patent previous dLAD   . CARDIOVASCULAR STRESS TEST  06/12/2008   Low risk nuclear study w/ no evidence ischemia/  normal LV funciton and wall motion , ef 60%  . COLONOSCOPY WITH PROPOFOL N/A 12/13/2013   Procedure: COLONOSCOPY WITH PROPOFOL;  Surgeon: Garlan Fair, MD;  Location: [facility];  Service: Endoscopy;  Laterality: N/A;  . CORONARY ANGIOPLASTY  05-18-1999  dr Tami Ribas   PTCA balloon to dLAD 80% (single vessel disease),  ef 60%  . CYSTOSCOPY WITH FULGERATION N/A 08/15/2017   Procedure: CYSTOSCOP/ BLADDER BIOPSY/TURBT;  Surgeon: Nickie Retort, MD;  Location[facility];  Service: Urology;  Laterality: N/A;  . CYSTOSCOPY WITH INSERTION OF UROLIFT N/A 06/23/2015   Procedure: CYSTOSCOPY WITH INSERTION OF UROLIFT;  Surgeon: Carolan Clines, MD;  Location: [facility];  Service: Urology;  Laterality: N/A;  . CYSTOSCOPY/URETEROSCOPY/HOLMIUM LASER/STENT PLACEMENT Bilateral 02/01/2017   Procedure: CYSTOSCOPY/URETEROSCOPY/HOLMIUM LASER/STENT PLACEMENT,STONE BASKETRY, BLADDER BIOPSY;  Surgeon: Nickie Retort, MD;  Location: [facility];  Service: Urology;  Laterality: Bilateral;  .  ESOPHAGOGASTRODUODENOSCOPY  08-24-2006  . TRANSTHORACIC ECHOCARDIOGRAM  03/06/2018   mild LVH,  ef 55-60%,  pt in atrial fib. LV diastolic not evaluated/  mild LAE and RAE/  moderate AV calcification with sclerosis , no stenosis or regurg./  mild dilated aortic root, 73mm/  trival MR and TR  . TRANSURETHRAL INCISION OF PROSTATE N/A 04/01/2014   Procedure: TRANSURETHRAL INCISION OF THE  PROSTATE (TUIP);  Surgeon: Ailene Rud, MD;  Location: [facility];  Service: Urology;  Laterality: N/A;    Allergies:  Allergies  Allergen Reactions  . Codeine Nausea And Vomiting    "made my head feel funny too"    Family History  Problem Relation Age of Onset  . Anuerysm Mother        Brain    Social History:  reports that he quit smoking about 14 years ago. His smoking use included cigarettes. He has a 25.00 pack-year smoking history. He has never used smokeless tobacco. He reports that he does not drink alcohol or use drugs.  ROS: A complete review of systems was performed.  All systems are negative except for pertinent findings as noted.  Physical Exam:  Vital signs in last 24 hours: Temp:  [97.3 F (36.3 C)] 97.3 F (36.3 C) (11/08 1057) Pulse Rate:  [61] 61 (11/08 1057) Resp:  [18] 18 (11/08 1057) BP: (155)/(93) 155/93 (11/08 1057) SpO2:  [100 %] 100 % (11/08 1057) Weight:  [95.1 kg] 95.1 kg (11/08 1057) Constitutional:  Alert and oriented, No acute distress Cardiovascular: Regular rate and rhythm, No JVD Respiratory: Normal respiratory effort, Lungs clear bilaterally GI: Abdomen is soft, nontender, nondistended, no abdominal masses GU: No CVA tenderness Lymphatic: No lymphadenopathy Neurologic: Grossly intact, no focal deficits Psychiatric: Normal mood and affect  Laboratory Data:  No results for input(s): WBC, HGB, HCT, PLT in the last 72 hours.  No results for input(s): NA, K, CL, GLUCOSE, BUN, CALCIUM, CREATININE in the last 72 hours.  Invalid input(s): CO3   No results found for this or any previous visit (from the past 24 hour(s)). No results found for this or any previous visit (from the past 240 hour(s)).  Renal Function: No results for input(s): CREATININE in the last 168 hours. CrCl cannot be calculated (Patient's most recent lab result is older than the maximum 21 days allowed.).  Radiologic Imaging: No results  found.  I independently reviewed the above imaging studies.  Assessment and Plan Marvin Gill is a 67 y.o. male with low grade urothelial carncinoma of the left ureter at the UVJ.  -The risks, benefits and alternatives of cystoscopy with left ureteroscopy, possible laser tumor ablation and ureteral stent placement was discussed the patient. Risks included, but are not limited to: bleeding, urinary tract infection, ureteral injury/avulsion, ureteral stricture formation, ureteral tumor recurrence, the possibility that multiple surgeries may be required to treat the ureteral tumor, MI, stroke, PE and the inherent risks of general anesthesia. The patient voices understanding and wishes to proceed.   -Pending the findings of his upcoming ureteroscopy, a treatment plan will be formulated for the treatment of his upper tract UCC, which could be continued surveillance and laser tumor ablation vs. distal left ureterectomy with ureteral re-implantation. This plan was discussed at length with the patient and he voices understanding.   Ellison Hughs, MD 04/07/2018, 12:20 PM  [facility] Pager: [phone]

## 2018-04-07 NOTE — Op Note (Signed)
Operative Note  Preoperative diagnosis:  1.  Low-grade TA urothelial carcinoma of the left distal ureter/ureteral orifice  Postoperative diagnosis: 1.  Same  Procedure(s): 1.  Cystoscopy with left ureteroscopy, holmium laser tumor ablation of the left distal ureteral tumor 2.  Bladder biopsy 3.  Left JJ stent placement  Surgeon: Ellison Hughs, MD  Assistants:  None  Anesthesia:  General  Complications:  None  EBL: Less than 5 mL  Specimens: 1.  Left ureteral orifice biopsy  Drains/Catheters: 1.  Left 6 French by 26 cm JJ stent without tether  Intraoperative findings:   1. Cystoscopy revealed possible tumor recurrence involving the left ureteral orifice versus post and edema.  This areas was biopsied and sent to pathology for permanent section.  Left ureteroscopy revealed no papillary tumors involving the left renal pelvis or proximal aspects of the left ureter.  There was a 2 similar area in the most distal aspects of the left ureter with papillary tumor within it.  These areas were extensively ablated with the holmium laser.  Indication:  Marvin Gill is a 67 y.o. male with a history of low-grade TA urothelial carcinoma of the left distal ureter/ureteral orifice.  He is here today for a repeat cystoscopy and ureteroscopy to assess for tumor recurrence as well as assess his upper urinary tract.  He has been consented for the above procedures, voices understanding and wished to proceed.  Description of procedure:  After informed consent was obtained, the patient was brought to the operating room and general LMA anesthesia was administered. The patient was then placed in the dorsolithotomy position and prepped and draped in usual sterile fashion. A timeout was performed. A 23 French rigid cystoscope was then inserted into the urethral meatus and advanced into the bladder under direct vision. A complete bladder survey revealed papillary lesion immediately adjacent to the  left ureteral orifice, otherwise, there were no other intravesical lesions or masses.  His previously placed left JJ stent was grasped at its distal curl and retracted to the urethral meatus.  A Glidewire was then used to intubate the lumen of the stent and was advanced up the left ureter up to the left renal pelvis, under fluoroscopic guidance.    The previously placed stent was removed intact, inspected and discarded.  An additional Glidewire was then placed up to the left renal pelvis, under fluoroscopic guidance.  A flexible ureteroscope was then advanced over the safety wire and into position within the left renal pelvis.  Careful inspection of all left renal calyces as well as a left renal pelvis revealed no evidence of papillary tumor or other lesions involving the urothelium.  The flexible ureteroscope was then slowly retracted in antegrade fashion down the proximal aspects of the left ureter, again, finding no evidence of ureteral tumors.  The distal aspects of the left ureter did show a 2 cm area of papillary tumor.  The flexible ureteroscope was then exchanged for a semirigid ureteroscope and a 200 m holmium laser was then used to ablate the left distal ureteral tumors.  The semirigid ureteroscope was then exchanged for a rigid cystoscope and a cold cup biopsy was taken of the lesion involving the left ureteral orifice.  The area of biopsy was then extensively fulgurated with electrocautery until hemostasis was achieved.  The biopsy specimen was sent to pathology for permanent section.  A 6 French by 26 cm JJ stent was then advanced over the wire and into good position within the left collecting  system, confirming placement by fluoroscopy.  The patient's bladder was drained.  He tolerated the procedure well was transferred to the postanesthesia in stable condition.  Plan: Follow-up in 2 weeks for office cystoscopy and stent removal.  I will discuss induction BCG vs left distal ureterectomy with  ureteral reimplant at his follow-up visit.

## 2018-04-07 NOTE — Anesthesia Procedure Notes (Signed)
Procedure Name: LMA Insertion Date/Time: 04/07/2018 1:45 PM Performed by: Justice Rocher, CRNA Pre-anesthesia Checklist: Patient identified, Emergency Drugs available, Suction available and Patient being monitored Patient Re-evaluated:Patient Re-evaluated prior to induction Oxygen Delivery Method: Circle system utilized Preoxygenation: Pre-oxygenation with 100% oxygen Induction Type: IV induction Ventilation: Mask ventilation without difficulty LMA: LMA inserted LMA Size: 5.0 Number of attempts: 1 Airway Equipment and Method: Bite block Placement Confirmation: positive ETCO2 and breath sounds checked- equal and bilateral Tube secured with: Tape Dental Injury: Teeth and Oropharynx as per pre-operative assessment

## 2018-04-07 NOTE — Anesthesia Postprocedure Evaluation (Signed)
Anesthesia Post Note  Patient: Mclain Freer Antu  Procedure(s) Performed: CYSTOSCOPY WITH URETEROSCOPY AND STENT EXCHANGE/ BLADDER BIOPSY/LASER ABLATION OF URETERAL TUMOR (Left Bladder)     Patient location during evaluation: PACU Anesthesia Type: General Level of consciousness: awake and alert Pain management: pain level controlled Vital Signs Assessment: post-procedure vital signs reviewed and stable Respiratory status: spontaneous breathing, nonlabored ventilation, respiratory function stable and patient connected to nasal cannula oxygen Cardiovascular status: blood pressure returned to baseline and stable Postop Assessment: no apparent nausea or vomiting Anesthetic complications: no    Last Vitals:  Vitals:   04/07/18 1515 04/07/18 1530  BP: 111/69 122/71  Pulse: (!) 54 (!) 51  Resp: 14 14  Temp:    SpO2: 96% 97%    Last Pain:  Vitals:   04/07/18 1530  TempSrc:   PainSc: 5                  Arlynn Stare L Micahel Omlor

## 2018-04-07 NOTE — Discharge Instructions (Signed)
[facility] [phone] Post Ureteroscopy With or Without Stent Instructions  Definitions:  Ureter: The duct that transports urine from the kidney to the bladder. Stent:   A plastic hollow tube that is placed into the ureter, from the kidney to the                 bladder to prevent the ureter from swelling shut.  GENERAL INSTRUCTIONS:  Despite the fact that no skin incisions were used, the area around the ureter and bladder is raw and irritated. The stent is a foreign body which will further irritate the bladder wall. This irritation is manifested by increased frequency of urination, both day and night, and by an increase in the urge to urinate. In some, the urge to urinate is present almost always. Sometimes the urge is strong enough that you may not be able to stop yourself from urinating. The only real cure is to remove the stent and then give time for the bladder wall to heal which can't be done until the danger of the ureter swelling shut has passed, which varies.  You may see some blood in your urine while the stent is in place and a few days afterwards. Do not be alarmed, even if the urine was clear for a while. Get off your feet and drink lots of fluids until clearing occurs. If you start to pass clots or don't improve, call us.  DIET: You may return to your normal diet immediately. Because of the raw surface of your bladder, alcohol, spicy foods, acid type foods and drinks with caffeine may cause irritation or frequency and should be used in moderation. To keep your urine flowing freely and to avoid constipation, drink plenty of fluids during the day ( 8-10 glasses ). Tip: Avoid cranberry juice because it is very acidic.  ACTIVITY: Your physical activity doesn't need to be restricted. However, if you are very active, you may see some blood in your urine. We suggest that you reduce your activity under these circumstances until the bleeding has stopped.  BOWELS: It is  important to keep your bowels regular during the postoperative period. Straining with bowel movements can cause bleeding. A bowel movement every other day is reasonable. Use a mild laxative if needed, such as Milk of Magnesia 2-3 tablespoons, or 2 Dulcolax tablets. Call if you continue to have problems. If you have been taking narcotics for pain, before, during or after your surgery, you may be constipated. Take a laxative if necessary.   MEDICATION: You should resume your pre-surgery medications unless told not to. In addition you will often be given an antibiotic to prevent infection. These should be taken as prescribed until the bottles are finished unless you are having an unusual reaction to one of the drugs.  PROBLEMS YOU SHOULD REPORT TO [place]:  Fevers over 100.5 Fahrenheit.  Heavy bleeding, or clots ( See above notes about blood in urine ).  Inability to urinate.  Drug reactions ( hives, rash, nausea, vomiting, diarrhea ).  Severe burning or pain with urination that is not improving.  FOLLOW-UP: You will need a follow-up appointment to monitor your progress. Call for this appointment at the number listed above. Usually the first appointment will be about three to fourteen days after your surgery.     NO ADVIL, ALEVE, MOTRIN, IBUPROFEN UNTIL 8:20 PM TONIGHT    Post Anesthesia Home Care Instructions  Activity: Get plenty of rest for the remainder of the day. A responsible adult  should stay with you for 24 hours following the procedure.  For the next 24 hours, DO NOT: -Drive a car -Paediatric nurse -Drink alcoholic beverages -Take any medication unless instructed by your physician -Make any legal decisions or sign important papers.  Meals: Start with liquid foods such as gelatin or soup. Progress to regular foods as tolerated. Avoid greasy, spicy, heavy foods. If nausea and/or vomiting occur, drink only clear liquids until the nausea and/or vomiting subsides. Call your  physician if vomiting continues.  Special Instructions/Symptoms: Your throat may feel dry or sore from the anesthesia or the breathing tube placed in your throat during surgery. If this causes discomfort, gargle with warm salt water. The discomfort should disappear within 24 hours.  If you had a scopolamine patch placed behind your ear for the management of post- operative nausea and/or vomiting:  1. The medication in the patch is effective for 72 hours, after which it should be removed.  Wrap patch in a tissue and discard in the trash. Wash hands thoroughly with soap and water. 2. You may remove the patch earlier than 72 hours if you experience unpleasant side effects which may include dry mouth, dizziness or visual disturbances. 3. Avoid touching the patch. Wash your hands with soap and water after contact with the patch.    Post Anesthesia Home Care Instructions  Activity: Get plenty of rest for the remainder of the day. A responsible adult should stay with you for 24 hours following the procedure.  For the next 24 hours, DO NOT: -Drive a car -Paediatric nurse -Drink alcoholic beverages -Take any medication unless instructed by your physician -Make any legal decisions or sign important papers.  Meals: Start with liquid foods such as gelatin or soup. Progress to regular foods as tolerated. Avoid greasy, spicy, heavy foods. If nausea and/or vomiting occur, drink only clear liquids until the nausea and/or vomiting subsides. Call your physician if vomiting continues.  Special Instructions/Symptoms: Your throat may feel dry or sore from the anesthesia or the breathing tube placed in your throat during surgery. If this causes discomfort, gargle with warm salt water. The discomfort should disappear within 24 hours.  If you had a scopolamine patch placed behind your ear for the management of post- operative nausea and/or vomiting:  1. The medication in the patch is effective for 72 hours,  after which it should be removed.  Wrap patch in a tissue and discard in the trash. Wash hands thoroughly with soap and water. 2. You may remove the patch earlier than 72 hours if you experience unpleasant side effects which may include dry mouth, dizziness or visual disturbances. 3. Avoid touching the patch. Wash your hands with soap and water after contact with the patch.

## 2018-04-07 NOTE — Transfer of Care (Signed)
Immediate Anesthesia Transfer of Care Note  Patient: Marvin Gill  Procedure(s) Performed: Procedure(s) (LRB): CYSTOSCOPY WITH URETEROSCOPY AND STENT EXCHANGE/ BLADDER BIOPSY/LASER ABLATION OF URETERAL TUMOR (Left)  Patient Location: PACU  Anesthesia Type: General  Level of Consciousness: awake, sedated, patient cooperative and responds to stimulation  Airway & Oxygen Therapy: Patient Spontanous Breathing and Patient connected to [place] oxygen  Post-op Assessment: Report given to PACU RN, Post -op Vital signs reviewed and stable and Patient moving all extremities  Post vital signs: Reviewed and stable  Complications: No apparent anesthesia complications

## 2018-04-07 NOTE — Anesthesia Preprocedure Evaluation (Addendum)
Anesthesia Evaluation  Patient identified by MRN, date of birth, ID band Patient awake    Reviewed: Allergy & Precautions, NPO status , Patient's Chart, lab work & pertinent test results  History of Anesthesia Complications (+) PONV  Airway Mallampati: III  TM Distance: >3 FB Neck ROM: Full    Dental no notable dental hx. (+) Teeth Intact, Dental Advisory Given   Pulmonary neg pulmonary ROS, former smoker,    Pulmonary exam normal breath sounds clear to auscultation       Cardiovascular + CAD (s/p angioplasty 2000) and + Peripheral Vascular Disease (AAA 3.1x3.1cm)  Normal cardiovascular exam+ dysrhythmias (on eliquis, last dose Tuesday evening) Atrial Fibrillation  Rhythm:Regular Rate:Normal  TTE 02/2018 Impressions: - The patient was in atrial fibrillation. Normal LV size with mild LV hypertrophy. EF 55-60%. Normal RV size and systolic function. No significant valvular abnormalities.  Stress Test 2010  abnormal EKG positive for ischemia, but nuclear imaging showed diaphragmatic attenuation without evidence for ischemia or infarct, EF 60%.   [place] 2003 unremarkable    Neuro/Psych PSYCHIATRIC DISORDERS Anxiety negative neurological ROS     GI/Hepatic Neg liver ROS, GERD  Medicated,  Endo/Other  Hypothyroidism   Renal/GU negative Renal ROS   BPH    Musculoskeletal  (+) Arthritis , Fibromyalgia -  Abdominal   Peds  Hematology negative hematology ROS (+)   Anesthesia Other Findings Bladder cancer  Reproductive/Obstetrics                           Anesthesia Physical Anesthesia Plan  ASA: III  Anesthesia Plan: General   Post-op Pain Management:    Induction: Intravenous  PONV Risk Score and Plan: 3 and Ondansetron, Dexamethasone, TIVA and Midazolam  Airway Management Planned:   Additional Equipment:   Intra-op Plan:   Post-operative Plan: Extubation in OR  Informed Consent:  I have reviewed the patients History and Physical, chart, labs and discussed the procedure including the risks, benefits and alternatives for the proposed anesthesia with the patient or authorized representative who has indicated his/her understanding and acceptance.   Dental advisory given  Plan Discussed with: CRNA  Anesthesia Plan Comments:         Anesthesia Quick Evaluation

## 2018-04-10 ENCOUNTER — Encounter (HOSPITAL_BASED_OUTPATIENT_CLINIC_OR_DEPARTMENT_OTHER): Payer: Self-pay | Admitting: Urology

## 2018-04-10 ENCOUNTER — Telehealth: Payer: Self-pay | Admitting: Internal Medicine

## 2018-04-10 MED ORDER — APIXABAN 5 MG PO TABS: 5.0000 mg | ORAL_TABLET | Freq: Two times a day (BID) | ORAL | 11 refills | Status: DC

## 2018-04-10 NOTE — Telephone Encounter (Signed)
Was off of Eliquis for surgery, not sure if he should be back on it. If he does need to be back on Eliquis he does need a refill.    *STAT* If patient is at the pharmacy, call can be transferred to refill team.   1. Which medications need to be refilled? (please list name of each medication and dose if known)   apixaban (ELIQUIS) 5 MG TABS tablet  2. Which pharmacy/location (including street and city if local pharmacy) is medication to be sent to?  [id number] - [place], [place] - [address] [place] [address] [place] [address]  3. Do they need a 30 day or 90 day supply? 30 days

## 2018-04-10 NOTE — Telephone Encounter (Signed)
Reviewed with Dr. Harrington Challenger. Pt should be taking Eliquis. Held x 3 days prior to urology procedure on 04/07/18. Refill sent to [facility]. Pt informed, verbalizes understanding. He didn't know because he only had couple pills left and called pharmacy and they did not have any refills for it. Asked him to double check that urology hadn't asked him to hold it post procedure.  Not having hematuria.

## 2018-04-12 DIAGNOSIS — R310 Gross hematuria: Secondary | ICD-10-CM | POA: Diagnosis not present

## 2018-04-12 DIAGNOSIS — R339 Retention of urine, unspecified: Secondary | ICD-10-CM | POA: Diagnosis not present

## 2018-04-12 DIAGNOSIS — N9989 Other postprocedural complications and disorders of genitourinary system: Secondary | ICD-10-CM | POA: Insufficient documentation

## 2018-04-13 ENCOUNTER — Encounter (HOSPITAL_COMMUNITY): Payer: Self-pay | Admitting: Emergency Medicine

## 2018-04-13 ENCOUNTER — Other Ambulatory Visit: Payer: Self-pay

## 2018-04-13 ENCOUNTER — Emergency Department (HOSPITAL_COMMUNITY)
Admission: EM | Admit: 2018-04-13 | Discharge: 2018-04-13 | Disposition: A | Discharge status: Home / Self Care | Payer: Medicare Other | Attending: Emergency Medicine | Admitting: Emergency Medicine

## 2018-04-13 DIAGNOSIS — R310 Gross hematuria: Secondary | ICD-10-CM

## 2018-04-13 DIAGNOSIS — R338 Other retention of urine: Secondary | ICD-10-CM

## 2018-04-13 LAB — URINALYSIS, ROUTINE W REFLEX MICROSCOPIC
Bilirubin Urine: NEGATIVE
Glucose, UA: 100 mg/dL — AB
Ketones, ur: 15 mg/dL — AB
NITRITE: POSITIVE — AB
Specific Gravity, Urine: 1.01 (ref 1.005–1.030)
pH: 8 (ref 5.0–8.0)

## 2018-04-13 LAB — URINALYSIS, MICROSCOPIC (REFLEX): Squamous Epithelial / LPF: NONE SEEN (ref 0–5)

## 2018-04-13 MED ORDER — HYOSCYAMINE SULFATE 0.125 MG PO TBDP
0.1250 mg | ORAL_TABLET | Freq: Once | ORAL | Status: AC
  Administered 2018-04-13: 0.125 mg via ORAL
  Filled 2018-04-13: qty 1

## 2018-04-13 NOTE — ED Notes (Signed)
I did a bladder scan and patient had 63ml in the bladder

## 2018-04-13 NOTE — ED Notes (Addendum)
Pt verbally upset with nurse, stating he doesn't understand why we will not let him stay overnight until his appointment tomorrow. This nurse explained that we cannot let people stay in the rooms because they have to be available for the next patient. Pt interrupted this nurses explanation to yell to turn off the "beeping" on the monitor.

## 2018-04-13 NOTE — ED Notes (Addendum)
Pt stating he has pain in his lower abdomen, Dr. Florina Ou made aware.

## 2018-04-13 NOTE — ED Triage Notes (Signed)
Patient is complaining of pain because he can not urinate. Patient states when he urinates it is blood. Bladder scanner showed over 600.

## 2018-04-13 NOTE — ED Notes (Signed)
This nurse told patient she would walk him out, patient became upset stating "dont you think you should get a wheelchair and wheel me to my car. You are really going to make me walk?" This nurse apologized stating that since he walked in and back to the room I did not think he would want a wheelchair, but would be happy to get one and wheel him out. This nurse let security know that a patient would need a ride to his car. When we got outside and to the security [name], pt states "My car isnt that far away, why should I get into the [facility] when you can just wheel me over there." Pt brought to his car safely.

## 2018-04-13 NOTE — ED Notes (Addendum)
Further documentation in the paper charting due to downtime.  Daughter of pt arrived while this nurse was in the room. Daughter stating she is on hold and nobody has answered the phone. This nurse apologized stating that I am in here and have been unable to get to the phone while being with the patient.

## 2018-04-13 NOTE — ED Provider Notes (Signed)
See Epic downtime documentation.   Shanon Rosser, MD 04/13/18 [phone]

## 2018-04-13 NOTE — ED Notes (Addendum)
Pt up and ambulatory throughout the halls, back and forth to the restroom. This nurse redirected to the room so we could insert a catheter. Pt states "he needs to shit" and went to the restroom again. Pt had urinated and had a bowel movement in the triage rooms trash can. Will attempt to reinsert cath when patient returns to the room.

## 2018-04-15 LAB — URINE CULTURE: CULTURE: NO GROWTH

## 2018-04-16 ENCOUNTER — Encounter (HOSPITAL_COMMUNITY): Payer: Self-pay

## 2018-04-16 ENCOUNTER — Other Ambulatory Visit: Payer: Self-pay

## 2018-04-16 ENCOUNTER — Emergency Department (HOSPITAL_COMMUNITY)
Admission: EM | Admit: 2018-04-16 | Discharge: 2018-04-16 | Disposition: A | Discharge status: Home / Self Care | Payer: Medicare Other | Attending: Emergency Medicine | Admitting: Emergency Medicine

## 2018-04-16 DIAGNOSIS — Z7901 Long term (current) use of anticoagulants: Secondary | ICD-10-CM | POA: Diagnosis not present

## 2018-04-16 DIAGNOSIS — I480 Paroxysmal atrial fibrillation: Secondary | ICD-10-CM | POA: Diagnosis not present

## 2018-04-16 DIAGNOSIS — Z79899 Other long term (current) drug therapy: Secondary | ICD-10-CM | POA: Insufficient documentation

## 2018-04-16 DIAGNOSIS — T83098A Other mechanical complication of other indwelling urethral catheter, initial encounter: Secondary | ICD-10-CM | POA: Insufficient documentation

## 2018-04-16 DIAGNOSIS — R339 Retention of urine, unspecified: Secondary | ICD-10-CM | POA: Diagnosis present

## 2018-04-16 DIAGNOSIS — Y829 Unspecified medical devices associated with adverse incidents: Secondary | ICD-10-CM | POA: Diagnosis not present

## 2018-04-16 DIAGNOSIS — Z8551 Personal history of malignant neoplasm of bladder: Secondary | ICD-10-CM | POA: Diagnosis not present

## 2018-04-16 DIAGNOSIS — I2510 Atherosclerotic heart disease of native coronary artery without angina pectoris: Secondary | ICD-10-CM | POA: Insufficient documentation

## 2018-04-16 DIAGNOSIS — E039 Hypothyroidism, unspecified: Secondary | ICD-10-CM | POA: Diagnosis not present

## 2018-04-16 DIAGNOSIS — T839XXA Unspecified complication of genitourinary prosthetic device, implant and graft, initial encounter: Secondary | ICD-10-CM

## 2018-04-16 DIAGNOSIS — Z87891 Personal history of nicotine dependence: Secondary | ICD-10-CM | POA: Diagnosis not present

## 2018-04-16 LAB — CBC WITH DIFFERENTIAL/PLATELET
Abs Immature Granulocytes: 0.01 10*3/uL (ref 0.00–0.07)
BASOS PCT: 0 %
Basophils Absolute: 0 10*3/uL (ref 0.0–0.1)
Eosinophils Absolute: 0.1 10*3/uL (ref 0.0–0.5)
Eosinophils Relative: 2 %
HEMATOCRIT: 42 % (ref 39.0–52.0)
Hemoglobin: 13.8 g/dL (ref 13.0–17.0)
IMMATURE GRANULOCYTES: 0 %
LYMPHS ABS: 1.4 10*3/uL (ref 0.7–4.0)
Lymphocytes Relative: 23 %
MCH: 30.4 pg (ref 26.0–34.0)
MCHC: 32.9 g/dL (ref 30.0–36.0)
MCV: 92.5 fL (ref 80.0–100.0)
MONOS PCT: 11 %
Monocytes Absolute: 0.7 10*3/uL (ref 0.1–1.0)
NEUTROS PCT: 64 %
Neutro Abs: 4 10*3/uL (ref 1.7–7.7)
PLATELETS: 203 10*3/uL (ref 150–400)
RBC: 4.54 MIL/uL (ref 4.22–5.81)
RDW: 14 % (ref 11.5–15.5)
WBC: 6.3 10*3/uL (ref 4.0–10.5)
nRBC: 0 % (ref 0.0–0.2)

## 2018-04-16 LAB — BASIC METABOLIC PANEL
ANION GAP: 8 (ref 5–15)
BUN: 16 mg/dL (ref 8–23)
CO2: 26 mmol/L (ref 22–32)
Calcium: 9 mg/dL (ref 8.9–10.3)
Chloride: 108 mmol/L (ref 98–111)
Creatinine, Ser: 0.79 mg/dL (ref 0.61–1.24)
GFR calc Af Amer: >60 mL/min (ref >60)
GLUCOSE: 93 mg/dL (ref 70–99)
POTASSIUM: 4 mmol/L (ref 3.5–5.1)
Sodium: 142 mmol/L (ref 135–145)

## 2018-04-16 LAB — URINALYSIS, ROUTINE W REFLEX MICROSCOPIC
Bilirubin Urine: NEGATIVE
Glucose, UA: NEGATIVE mg/dL
Ketones, ur: NEGATIVE mg/dL
LEUKOCYTES UA: NEGATIVE
NITRITE: POSITIVE — AB
PH: 6 (ref 5.0–8.0)
Protein, ur: 100 mg/dL — AB
RBC / HPF: >50 RBC/hpf — ABNORMAL HIGH (ref 0–5)
Specific Gravity, Urine: 1.016 (ref 1.005–1.030)

## 2018-04-16 MED ORDER — SODIUM CHLORIDE 0.9 % IV BOLUS
1000.0000 mL | Freq: Once | INTRAVENOUS | Status: AC
  Administered 2018-04-16: 1000 mL via INTRAVENOUS

## 2018-04-16 MED ORDER — CEPHALEXIN 500 MG PO CAPS: 500.0000 mg | ORAL_CAPSULE | Freq: Three times a day (TID) | ORAL | 0 refills | Status: DC

## 2018-04-16 NOTE — ED Notes (Signed)
Pt stated that leg bag was filled with urine.  Pt emptied leg bag.  Informed RN.

## 2018-04-16 NOTE — ED Notes (Signed)
200 mL tea colored urine with small blood clots emptied from patients leg bag.

## 2018-04-16 NOTE — ED Triage Notes (Signed)
He states he had a foley catheter place tha day after a "bladder cancer treatment and stent insertion" last Friday (9 days ago) d/t urinary retention. He is here today with c/o "only a dribble of urine comes out of it, and there were blood clots in it-I think they have it stopped up".

## 2018-04-16 NOTE — Discharge Instructions (Addendum)
Try to drink at least 8 glasses of water daily to keep your urine clear and flowing  Take the antibiotic as prescribed; if the culture is negative, you can stop  Follow-up with Urology as scheduled, this week

## 2018-04-16 NOTE — ED Notes (Signed)
Pt reports have burning at meatus of penis, starting this morning.  Denies pain currently.

## 2018-04-16 NOTE — ED Provider Notes (Signed)
[facility] DEPT Provider Note   CSN: [id number] Arrival date & time: 04/16/18  1143     History   Chief Complaint Chief Complaint  Patient presents with  . Urinary Retention    HPI Marvin Gill is a 67 y.o. male.  HPI 67 year old male with past medical history as below here with urinary retention.  The patient recently had procedure for his BPH.  He has a Foley in place.  He states that earlier today, he felt like something got stuck in his Foley.  He then experienced swelling, burning sensation at the tip of his penis.  He was able to move around and had slight relief, but symptoms returned.  He states that in order to help relieve the pressure, he ran, but this is not improved.  While waiting in the waiting room, he reportedly passed a small clot through his Foley bag and is now feeling much better.  His pain is resolved.  He denies any fevers.  No flank pain.  Past Medical History:  Diagnosis Date  . AF (paroxysmal atrial fibrillation) ([place]) 03/05/2018   new dx in settin sepsis  . Aneurysm of abdominal aorta ([facility])    a. ultasound 08-04-16 --- 3.1 CM X 3.1 CM - sees Dr. Laurann Montana - F/u due 2021.  Marland Kitchen Anxiety   . Arthritis    back  . Benign localized prostatic hyperplasia with lower urinary tract symptoms (LUTS)   . Bladder cancer ([place])   . Coronary artery disease primary cardiologist-- dr Dorris Carnes   a. s/p PTCA to dLAD 2000. b. Normal coronaries in 2003, patent prior PTCA site.  . Fibromyalgia   . GERD (gastroesophageal reflux disease)   . History of kidney stones   . History of skin cancer   . Hypothyroidism   . PONV (postoperative nausea and vomiting)    NAUSEA ALL DAY AFTER 02-01-17 SURGERY, PT WANTS ANESTHESIA LIKE 06-23-15 SURGERY WITH DR  [name]  . S/P percutaneous transluminal coronary angioplasty    05-18-1999  to dLAD  . Wears glasses     Patient Active Problem List   Diagnosis Date Noted  . Orthostasis 03/08/2018  .  Malnutrition of moderate degree 03/07/2018  . Sepsis ([place]) 03/05/2018  . AKI (acute kidney injury) ([place]) 03/05/2018  . Benign prostate hyperplasia 04/01/2014    Past Surgical History:  Procedure Laterality Date  . CARDIAC CATHETERIZATION  06-14-2001  dr Einar Gip   normal coronary arteries and patent previous dLAD   . CARDIOVASCULAR STRESS TEST  06/12/2008   Low risk nuclear study w/ no evidence ischemia/  normal LV funciton and wall motion , ef 60%  . COLONOSCOPY WITH PROPOFOL N/A 12/13/2013   Procedure: COLONOSCOPY WITH PROPOFOL;  Surgeon: Garlan Fair, MD;  Location: [facility];  Service: Endoscopy;  Laterality: N/A;  . CORONARY ANGIOPLASTY  05-18-1999  dr Tami Ribas   PTCA balloon to dLAD 80% (single vessel disease),  ef 60%  . CYSTOSCOPY WITH FULGERATION N/A 08/15/2017   Procedure: CYSTOSCOP/ BLADDER BIOPSY/TURBT;  Surgeon: Nickie Retort, MD;  Location: [facility];  Service: Urology;  Laterality: N/A;  . CYSTOSCOPY WITH INSERTION OF UROLIFT N/A 06/23/2015   Procedure: CYSTOSCOPY WITH INSERTION OF UROLIFT;  Surgeon: Carolan Clines, MD;  Location: [facility];  Service: Urology;  Laterality: N/A;  . CYSTOSCOPY WITH URETEROSCOPY AND STENT PLACEMENT Left 04/07/2018   Procedure: CYSTOSCOPY WITH URETEROSCOPY AND STENT EXCHANGE/ BLADDER BIOPSY/LASER ABLATION OF URETERAL TUMOR;  Surgeon: Ceasar Mons, MD;  Location: [facility];  Service: Urology;  Laterality: Left;  ONLY NEEDS 60 MIN  . CYSTOSCOPY/URETEROSCOPY/HOLMIUM LASER/STENT PLACEMENT Bilateral 02/01/2017   Procedure: CYSTOSCOPY/URETEROSCOPY/HOLMIUM LASER/STENT PLACEMENT,STONE BASKETRY, BLADDER BIOPSY;  Surgeon: Nickie Retort, MD;  Location: [facility];  Service: Urology;  Laterality: Bilateral;  . ESOPHAGOGASTRODUODENOSCOPY  08-24-2006  . TRANSTHORACIC ECHOCARDIOGRAM  03/06/2018   mild LVH,  ef 55-60%,  pt in atrial fib. LV diastolic not evaluated/  mild LAE and RAE/   moderate AV calcification with sclerosis , no stenosis or regurg./  mild dilated aortic root, 71mm/  trival MR and TR  . TRANSURETHRAL INCISION OF PROSTATE N/A 04/01/2014   Procedure: TRANSURETHRAL INCISION OF THE PROSTATE (TUIP);  Surgeon: Ailene Rud, MD;  Location[facility];  Service: Urology;  Laterality: N/A;        Home Medications    Prior to Admission medications   Medication Sig Start Date End Date Taking? Authorizing Provider  ALPRAZolam Duanne Moron) 1 MG tablet Take 1 mg by mouth 2 (two) times daily.     [provider]  apixaban (ELIQUIS) 5 MG TABS tablet Take 1 tablet (5 mg total) by mouth 2 (two) times daily. 04/10/18   Fay Records, MD  cephALEXin (KEFLEX) 500 MG capsule Take 1 capsule (500 mg total) by mouth 3 (three) times daily for 7 days. 04/16/18 04/23/18  Duffy Bruce, MD  esomeprazole (NEXIUM) 40 MG capsule Take 40 mg by mouth every morning.     [provider]  gabapentin (NEURONTIN) 800 MG tablet Take 800 mg by mouth 3 (three) times daily.  12/30/17   [provider]  HYDROcodone-acetaminophen (NORCO) 5-325 MG tablet Take 1 tablet by mouth every 4 (four) hours as needed for moderate pain. 04/07/18   Ceasar Mons, MD  levothyroxine (SYNTHROID, LEVOTHROID) 50 MCG tablet Take 1 tablet (50 mcg total) by mouth daily before breakfast. Patient taking differently: Take 50 mcg by mouth daily before breakfast.  03/10/18   Raiford Noble Latif, DO  lip balm (CARMEX) ointment Apply topically as needed for lip care. 03/09/18   Sheikh, Omair Latif, DO  ondansetron (ZOFRAN) 4 MG tablet Take 1 tablet (4 mg total) by mouth daily as needed for nausea or vomiting. 04/07/18 04/07/19  Ceasar Mons, MD  phenazopyridine (PYRIDIUM) 200 MG tablet Take 1 tablet (200 mg total) by mouth 3 (three) times daily as needed (for pain with urination). 04/07/18 04/07/19  Ceasar Mons, MD  pravastatin (PRAVACHOL) 40 MG  tablet Take 40 mg by mouth every morning.  05/07/15   [provider]  valACYclovir (VALTREX) 1000 MG tablet Take 1,000 mg by mouth 2 (two) times daily as needed. X 7 DAYS, THEN 500 MG BID X 6 MONTHS    [provider]  vitamin B-12 (CYANOCOBALAMIN) 1000 MCG tablet Take 1,000 mcg by mouth daily.    [provider]    Family History Family History  Problem Relation Age of Onset  . Anuerysm Mother        Brain    Social History Social History   Tobacco Use  . Smoking status: Former Smoker    Packs/day: 1.00    Years: 25.00    Pack years: 25.00    Types: Cigarettes    Last attempt to quit: 06/01/2003    Years since quitting: 14.8  . Smokeless tobacco: Never Used  Substance Use Topics  . Alcohol use: No    Frequency: Never  . Drug  use: No     Allergies   Codeine   Review of Systems Review of Systems  Constitutional: Negative for chills, fatigue and fever.  HENT: Negative for congestion and rhinorrhea.   Eyes: Negative for visual disturbance.  Respiratory: Negative for cough, shortness of breath and wheezing.   Cardiovascular: Negative for chest pain and leg swelling.  Gastrointestinal: Negative for abdominal pain, diarrhea, nausea and vomiting.  Genitourinary: Positive for decreased urine volume, difficulty urinating and penile pain. Negative for dysuria and flank pain.  Musculoskeletal: Negative for neck pain and neck stiffness.  Skin: Negative for rash and wound.  Allergic/Immunologic: Negative for immunocompromised state.  Neurological: Negative for syncope, weakness and headaches.  All other systems reviewed and are negative.    Physical Exam Updated Vital Signs BP 132/70   Pulse 63   Temp (!) 97.4 F (36.3 C) (Oral)   Resp 16   SpO2 98%   Physical Exam  Constitutional: He is oriented to person, place, and time. He appears well-developed and well-nourished. No distress.  HENT:  Head: Normocephalic and atraumatic.  Eyes:  Conjunctivae are normal.  Neck: Neck supple.  Cardiovascular: Normal rate, regular rhythm and normal heart sounds. Exam reveals no friction rub.  No murmur heard. Pulmonary/Chest: Effort normal and breath sounds normal. No respiratory distress. He has no wheezes. He has no rales.  Abdominal: Soft. Bowel sounds are normal. He exhibits no distension. There is no tenderness.  No CVAT bilaterally. No suprapubic TTP.  Genitourinary:  Genitourinary Comments: 59F catheter in place. No pericatheter leakage. No drainage or discharge.  Musculoskeletal: He exhibits no edema.  Neurological: He is alert and oriented to person, place, and time. He exhibits normal muscle tone.  Skin: Skin is warm. Capillary refill takes less than 2 seconds.  Psychiatric: He has a normal mood and affect.  Nursing note and vitals reviewed.    ED Treatments / Results  Labs (all labs ordered are listed, but only abnormal results are displayed) Labs Reviewed  URINALYSIS, ROUTINE W REFLEX MICROSCOPIC - Abnormal; Notable for the following components:      Result Value   Color, Urine AMBER (*)    Hgb urine dipstick LARGE (*)    Protein, ur 100 (*)    Nitrite POSITIVE (*)    RBC / HPF >50 (*)    Bacteria, UA RARE (*)    All other components within normal limits  URINE CULTURE  CBC WITH DIFFERENTIAL/PLATELET  BASIC METABOLIC PANEL    EKG None  Radiology No results found.  Procedures Procedures (including critical care time)  Medications Ordered in ED Medications  sodium chloride 0.9 % bolus 1,000 mL (0 mLs Intravenous Stopped 04/16/18 1456)     Initial Impression / Assessment and Plan / ED Course  I have reviewed the triage vital signs and the nursing notes.  Pertinent labs & imaging results that were available during my care of the patient were reviewed by me and considered in my medical decision making (see chart for details).    67 year old male here with recurrent, transient urinary retention  likely due to clot formation in his Foley catheter.  He has a 34 [place] in place which seems to be draining without difficulty.  There is no drainage around the catheter.  Bladder scan shows no retained urine.  Patient was given a bag of fluid to help with hydration and lab work today is very reassuring.  He does have increasing amounts of bacteria and pyuria in his urine.  Urine culture from several days ago was negative, but he does note increased sediment.  Given his increased sediment and now transient clot, I think is reasonable to start him on Keflex and discontinue this if the culture is repeated negative.  Hopefully this will prevent any increase in sedimentation of his urine.  Advised hydration and outpatient urology follow-up.  Return precautions given.  Final Clinical Impressions(s) / ED Diagnoses   Final diagnoses:  Complication of Foley catheter, initial encounter [facility])    ED Discharge Orders         Ordered    cephALEXin (KEFLEX) 500 MG capsule  3 times daily     04/16/18 1545           Duffy Bruce, MD 04/16/18 1935

## 2018-04-17 ENCOUNTER — Other Ambulatory Visit: Payer: Self-pay | Admitting: Internal Medicine

## 2018-04-17 MED ORDER — DILTIAZEM HCL ER COATED BEADS 180 MG PO CP24: 180.0000 mg | ORAL_CAPSULE | Freq: Every day | ORAL | 3 refills | Status: DC

## 2018-04-17 NOTE — Telephone Encounter (Signed)
Spoke with the pt and he is still taking the diltiazem CD 180mg  a day. He was having to hold if for 2 days during a procedure but was told to resume it and he needs RX sent in.. RX sent.

## 2018-04-17 NOTE — Telephone Encounter (Signed)
Pt was seen by B Bhagat on Mar 16, 2018  Was on diltiazem at that time   He was seen in ED a couple times   Last visit it was not on his list   I am not sure who stopped   Needs to be clarified

## 2018-04-17 NOTE — Telephone Encounter (Signed)
[facility] pharmacy is requesting a refill on Diltiazem CD 180  Mg Capsules. This medication was D/C, but I do not see that a provider D/C this medication. Does the pt supposed to still be taking this medication? Please address

## 2018-04-18 LAB — URINE CULTURE
Culture: NO GROWTH
Special Requests: NORMAL

## 2018-04-21 ENCOUNTER — Encounter (HOSPITAL_COMMUNITY): Payer: Self-pay

## 2018-04-21 ENCOUNTER — Inpatient Hospital Stay (HOSPITAL_COMMUNITY)
Admission: EM | Admit: 2018-04-21 | Discharge: 2018-04-25 | DRG: 853 | Disposition: A | Discharge status: Home / Self Care | Payer: Medicare Other | Attending: Internal Medicine | Admitting: Internal Medicine

## 2018-04-21 ENCOUNTER — Inpatient Hospital Stay (HOSPITAL_COMMUNITY): Payer: Medicare Other | Admitting: Anesthesiology

## 2018-04-21 ENCOUNTER — Emergency Department (HOSPITAL_COMMUNITY): Payer: Medicare Other

## 2018-04-21 ENCOUNTER — Encounter (HOSPITAL_COMMUNITY)
Admission: EM | Disposition: A | Discharge status: Home / Self Care | Payer: Self-pay | Source: Home / Self Care | Attending: Internal Medicine

## 2018-04-21 DIAGNOSIS — M479 Spondylosis, unspecified: Secondary | ICD-10-CM | POA: Diagnosis present

## 2018-04-21 DIAGNOSIS — N135 Crossing vessel and stricture of ureter without hydronephrosis: Secondary | ICD-10-CM | POA: Diagnosis not present

## 2018-04-21 DIAGNOSIS — J439 Emphysema, unspecified: Secondary | ICD-10-CM | POA: Diagnosis present

## 2018-04-21 DIAGNOSIS — I482 Chronic atrial fibrillation, unspecified: Secondary | ICD-10-CM | POA: Diagnosis present

## 2018-04-21 DIAGNOSIS — R6521 Severe sepsis with septic shock: Secondary | ICD-10-CM | POA: Diagnosis present

## 2018-04-21 DIAGNOSIS — I2510 Atherosclerotic heart disease of native coronary artery without angina pectoris: Secondary | ICD-10-CM | POA: Diagnosis present

## 2018-04-21 DIAGNOSIS — M797 Fibromyalgia: Secondary | ICD-10-CM | POA: Diagnosis present

## 2018-04-21 DIAGNOSIS — Z7901 Long term (current) use of anticoagulants: Secondary | ICD-10-CM

## 2018-04-21 DIAGNOSIS — Z87891 Personal history of nicotine dependence: Secondary | ICD-10-CM | POA: Diagnosis not present

## 2018-04-21 DIAGNOSIS — E039 Hypothyroidism, unspecified: Secondary | ICD-10-CM | POA: Diagnosis present

## 2018-04-21 DIAGNOSIS — Z419 Encounter for procedure for purposes other than remedying health state, unspecified: Secondary | ICD-10-CM

## 2018-04-21 DIAGNOSIS — Z87442 Personal history of urinary calculi: Secondary | ICD-10-CM

## 2018-04-21 DIAGNOSIS — E876 Hypokalemia: Secondary | ICD-10-CM | POA: Diagnosis present

## 2018-04-21 DIAGNOSIS — Z885 Allergy status to narcotic agent status: Secondary | ICD-10-CM

## 2018-04-21 DIAGNOSIS — Z452 Encounter for adjustment and management of vascular access device: Secondary | ICD-10-CM | POA: Diagnosis not present

## 2018-04-21 DIAGNOSIS — N131 Hydronephrosis with ureteral stricture, not elsewhere classified: Secondary | ICD-10-CM | POA: Diagnosis present

## 2018-04-21 DIAGNOSIS — A419 Sepsis, unspecified organism: Secondary | ICD-10-CM | POA: Diagnosis present

## 2018-04-21 DIAGNOSIS — Z8551 Personal history of malignant neoplasm of bladder: Secondary | ICD-10-CM | POA: Diagnosis not present

## 2018-04-21 DIAGNOSIS — I480 Paroxysmal atrial fibrillation: Secondary | ICD-10-CM | POA: Diagnosis present

## 2018-04-21 DIAGNOSIS — Z85828 Personal history of other malignant neoplasm of skin: Secondary | ICD-10-CM | POA: Diagnosis not present

## 2018-04-21 DIAGNOSIS — Z79890 Hormone replacement therapy (postmenopausal): Secondary | ICD-10-CM

## 2018-04-21 DIAGNOSIS — K219 Gastro-esophageal reflux disease without esophagitis: Secondary | ICD-10-CM | POA: Diagnosis present

## 2018-04-21 DIAGNOSIS — Z9861 Coronary angioplasty status: Secondary | ICD-10-CM | POA: Diagnosis not present

## 2018-04-21 DIAGNOSIS — N401 Enlarged prostate with lower urinary tract symptoms: Secondary | ICD-10-CM | POA: Diagnosis present

## 2018-04-21 HISTORY — PX: CYSTOSCOPY WITH STENT PLACEMENT: SHX5790

## 2018-04-21 LAB — URINALYSIS, ROUTINE W REFLEX MICROSCOPIC
Bilirubin Urine: NEGATIVE
Glucose, UA: NEGATIVE mg/dL
Ketones, ur: NEGATIVE mg/dL
Leukocytes, UA: NEGATIVE
Nitrite: NEGATIVE
Protein, ur: 30 mg/dL — AB
RBC / HPF: >50 RBC/hpf — ABNORMAL HIGH (ref 0–5)
Specific Gravity, Urine: 1.012 (ref 1.005–1.030)
pH: 5 (ref 5.0–8.0)

## 2018-04-21 LAB — CBC WITH DIFFERENTIAL/PLATELET
Abs Immature Granulocytes: 0.01 10*3/uL (ref 0.00–0.07)
Basophils Absolute: 0 10*3/uL (ref 0.0–0.1)
Basophils Relative: 0 %
Eosinophils Absolute: 0 10*3/uL (ref 0.0–0.5)
Eosinophils Relative: 0 %
HCT: 46.9 % (ref 39.0–52.0)
Hemoglobin: 14.6 g/dL (ref 13.0–17.0)
Immature Granulocytes: 0 %
Lymphocytes Relative: 9 %
Lymphs Abs: 0.9 10*3/uL (ref 0.7–4.0)
MCH: 28.5 pg (ref 26.0–34.0)
MCHC: 31.1 g/dL (ref 30.0–36.0)
MCV: 91.4 fL (ref 80.0–100.0)
Monocytes Absolute: 0.1 10*3/uL (ref 0.1–1.0)
Monocytes Relative: 1 %
Neutro Abs: 8.7 10*3/uL — ABNORMAL HIGH (ref 1.7–7.7)
Neutrophils Relative %: 90 %
Platelets: 280 10*3/uL (ref 150–400)
RBC: 5.13 MIL/uL (ref 4.22–5.81)
RDW: 13.4 % (ref 11.5–15.5)
WBC: 9.7 10*3/uL (ref 4.0–10.5)
nRBC: 0 % (ref 0.0–0.2)

## 2018-04-21 LAB — CORTISOL: Cortisol, Plasma: 25 ug/dL

## 2018-04-21 LAB — PROTIME-INR
INR: 1.35
PROTHROMBIN TIME: 16.6 s — AB (ref 11.4–15.2)

## 2018-04-21 LAB — I-STAT CG4 LACTIC ACID, ED
LACTIC ACID, VENOUS: 3.13 mmol/L — AB (ref 0.5–1.9)
Lactic Acid, Venous: 4.46 mmol/L (ref 0.5–1.9)
Lactic Acid, Venous: 4.93 mmol/L (ref 0.5–1.9)

## 2018-04-21 LAB — COMPREHENSIVE METABOLIC PANEL
ALT: 20 U/L (ref 0–44)
AST: 20 U/L (ref 15–41)
Albumin: 3.8 g/dL (ref 3.5–5.0)
Alkaline Phosphatase: 78 U/L (ref 38–126)
Anion gap: 11 (ref 5–15)
BUN: 12 mg/dL (ref 8–23)
CO2: 23 mmol/L (ref 22–32)
Calcium: 9.1 mg/dL (ref 8.9–10.3)
Chloride: 107 mmol/L (ref 98–111)
Creatinine, Ser: 1.05 mg/dL (ref 0.61–1.24)
GFR calc Af Amer: >60 mL/min (ref >60)
GFR calc non Af Amer: >60 mL/min (ref >60)
Glucose, Bld: 108 mg/dL — ABNORMAL HIGH (ref 70–99)
Potassium: 3.4 mmol/L — ABNORMAL LOW (ref 3.5–5.1)
Sodium: 141 mmol/L (ref 135–145)
Total Bilirubin: 1 mg/dL (ref 0.3–1.2)
Total Protein: 6.9 g/dL (ref 6.5–8.1)

## 2018-04-21 LAB — PROCALCITONIN: PROCALCITONIN: 16.06 ng/mL

## 2018-04-21 LAB — LIPASE, BLOOD: Lipase: 25 U/L (ref 11–51)

## 2018-04-21 LAB — I-STAT TROPONIN, ED: Troponin i, poc: 0 ng/mL (ref 0.00–0.08)

## 2018-04-21 LAB — LACTIC ACID, PLASMA: Lactic Acid, Venous: 3.2 mmol/L (ref 0.5–1.9)

## 2018-04-21 SURGERY — CYSTOSCOPY, WITH STENT INSERTION
Anesthesia: General | Site: Abdomen | Laterality: Left

## 2018-04-21 MED ORDER — SUFENTANIL CITRATE 50 MCG/ML IV SOLN
INTRAVENOUS | Status: AC
  Filled 2018-04-21: qty 1

## 2018-04-21 MED ORDER — IOPAMIDOL (ISOVUE-370) INJECTION 76%
100.0000 mL | Freq: Once | INTRAVENOUS | Status: AC | PRN
  Administered 2018-04-21: 100 mL via INTRAVENOUS

## 2018-04-21 MED ORDER — LACTATED RINGERS IV BOLUS
1000.0000 mL | Freq: Once | INTRAVENOUS | Status: AC
  Administered 2018-04-21: 1000 mL via INTRAVENOUS

## 2018-04-21 MED ORDER — DILTIAZEM HCL-DEXTROSE 100-5 MG/100ML-% IV SOLN (PREMIX)
5.0000 mg/h | INTRAVENOUS | Status: DC
  Administered 2018-04-21: 5 mg/h via INTRAVENOUS
  Filled 2018-04-21: qty 100

## 2018-04-21 MED ORDER — SODIUM CHLORIDE 0.9 % IV BOLUS (SEPSIS)
1000.0000 mL | Freq: Once | INTRAVENOUS | Status: AC
  Administered 2018-04-21: 1000 mL via INTRAVENOUS

## 2018-04-21 MED ORDER — LIDOCAINE HCL URETHRAL/MUCOSAL 2 % EX GEL
1.0000 "application " | Freq: Once | CUTANEOUS | Status: AC
  Administered 2018-04-21: 1 via TOPICAL
  Filled 2018-04-21: qty 20

## 2018-04-21 MED ORDER — IOPAMIDOL (ISOVUE-370) INJECTION 76%
INTRAVENOUS | Status: AC
  Filled 2018-04-21: qty 100

## 2018-04-21 MED ORDER — PIPERACILLIN-TAZOBACTAM 3.375 G IVPB
3.3750 g | Freq: Three times a day (TID) | INTRAVENOUS | Status: DC
  Administered 2018-04-22 – 2018-04-24 (×7): 3.375 g via INTRAVENOUS
  Filled 2018-04-21 (×7): qty 50

## 2018-04-21 MED ORDER — SODIUM CHLORIDE 0.9 % IV BOLUS
1000.0000 mL | Freq: Once | INTRAVENOUS | Status: AC
  Administered 2018-04-21: 1000 mL via INTRAVENOUS

## 2018-04-21 MED ORDER — SODIUM CHLORIDE 0.9 % IV SOLN: INTRAVENOUS | Status: DC

## 2018-04-21 MED ORDER — HYDROMORPHONE HCL 1 MG/ML IJ SOLN
1.0000 mg | Freq: Once | INTRAMUSCULAR | Status: AC
  Administered 2018-04-21: 1 mg via INTRAVENOUS
  Filled 2018-04-21: qty 1

## 2018-04-21 MED ORDER — FLUORESCEIN SODIUM 10 % IV SOLN
INTRAVENOUS | Status: AC
  Filled 2018-04-21: qty 5

## 2018-04-21 MED ORDER — NOREPINEPHRINE 4 MG/250ML-% IV SOLN
2.0000 ug/min | INTRAVENOUS | Status: DC
  Administered 2018-04-21: 2 ug/min via INTRAVENOUS
  Administered 2018-04-22: 10 ug/min via INTRAVENOUS
  Administered 2018-04-22: 9 ug/min via INTRAVENOUS
  Filled 2018-04-21 (×3): qty 250

## 2018-04-21 MED ORDER — SUFENTANIL CITRATE 50 MCG/ML IV SOLN
INTRAVENOUS | Status: DC | PRN
  Administered 2018-04-21: 10 ug via INTRAVENOUS

## 2018-04-21 MED ORDER — SODIUM CHLORIDE 0.9 % IV SOLN
250.0000 mL | INTRAVENOUS | Status: DC
  Administered 2018-04-22: 250 mL via INTRAVENOUS

## 2018-04-21 MED ORDER — HEPARIN SODIUM (PORCINE) 5000 UNIT/ML IJ SOLN
5000.0000 [IU] | Freq: Three times a day (TID) | INTRAMUSCULAR | Status: DC
  Administered 2018-04-22 – 2018-04-23 (×4): 5000 [IU] via SUBCUTANEOUS
  Filled 2018-04-21 (×4): qty 1

## 2018-04-21 MED ORDER — FAMOTIDINE IN NACL 20-0.9 MG/50ML-% IV SOLN
20.0000 mg | Freq: Two times a day (BID) | INTRAVENOUS | Status: DC
  Filled 2018-04-21: qty 50

## 2018-04-21 MED ORDER — PIPERACILLIN-TAZOBACTAM 3.375 G IVPB 30 MIN
3.3750 g | Freq: Once | INTRAVENOUS | Status: AC
  Administered 2018-04-21: 3.375 g via INTRAVENOUS
  Filled 2018-04-21: qty 50

## 2018-04-21 MED ORDER — LEVOTHYROXINE SODIUM 100 MCG IV SOLR
25.0000 ug | Freq: Every day | INTRAVENOUS | Status: DC
  Filled 2018-04-21: qty 5

## 2018-04-21 MED ORDER — MIDAZOLAM HCL 2 MG/2ML IJ SOLN
INTRAMUSCULAR | Status: AC
  Filled 2018-04-21: qty 2

## 2018-04-21 MED ORDER — IOHEXOL 300 MG/ML  SOLN: 100.0000 mL | Freq: Once | INTRAMUSCULAR | Status: DC | PRN

## 2018-04-21 MED ORDER — LACTATED RINGERS IV SOLN
INTRAVENOUS | Status: DC | PRN
  Administered 2018-04-21 – 2018-04-22 (×2): via INTRAVENOUS

## 2018-04-21 MED ORDER — VANCOMYCIN HCL 10 G IV SOLR
1500.0000 mg | Freq: Once | INTRAVENOUS | Status: AC
  Administered 2018-04-21: 1500 mg via INTRAVENOUS
  Filled 2018-04-21: qty 1500

## 2018-04-21 MED ORDER — MORPHINE SULFATE (PF) 2 MG/ML IV SOLN
1.0000 mg | INTRAVENOUS | Status: DC | PRN
  Administered 2018-04-21: 2 mg via INTRAVENOUS
  Administered 2018-04-22: 1 mg via INTRAVENOUS
  Administered 2018-04-22 (×2): 2 mg via INTRAVENOUS
  Administered 2018-04-22 (×2): 1 mg via INTRAVENOUS
  Administered 2018-04-23: 2 mg via INTRAVENOUS
  Filled 2018-04-21 (×7): qty 1

## 2018-04-21 MED ORDER — VANCOMYCIN HCL IN DEXTROSE 1-5 GM/200ML-% IV SOLN
1000.0000 mg | Freq: Two times a day (BID) | INTRAVENOUS | Status: DC
  Administered 2018-04-22 – 2018-04-24 (×5): 1000 mg via INTRAVENOUS
  Filled 2018-04-21 (×5): qty 200

## 2018-04-21 MED ORDER — FENTANYL CITRATE (PF) 100 MCG/2ML IJ SOLN
100.0000 ug | Freq: Once | INTRAMUSCULAR | Status: AC
  Administered 2018-04-21: 100 ug via INTRAVENOUS
  Filled 2018-04-21: qty 2

## 2018-04-21 MED ORDER — NOREPINEPHRINE 4 MG/250ML-% IV SOLN: 0.0000 ug/min | INTRAVENOUS | Status: DC

## 2018-04-21 MED ORDER — MIDAZOLAM HCL 2 MG/2ML IJ SOLN
INTRAMUSCULAR | Status: DC | PRN
  Administered 2018-04-21: 2 mg via INTRAVENOUS

## 2018-04-21 MED ORDER — PROPOFOL 10 MG/ML IV BOLUS
INTRAVENOUS | Status: AC
  Filled 2018-04-21: qty 20

## 2018-04-21 MED ORDER — DILTIAZEM LOAD VIA INFUSION
10.0000 mg | Freq: Once | INTRAVENOUS | Status: AC
  Administered 2018-04-21: 10 mg via INTRAVENOUS
  Filled 2018-04-21: qty 10

## 2018-04-21 MED ORDER — SUCCINYLCHOLINE CHLORIDE 20 MG/ML IJ SOLN
INTRAMUSCULAR | Status: DC | PRN
  Administered 2018-04-21: 100 mg via INTRAVENOUS

## 2018-04-21 MED ORDER — PROPOFOL 10 MG/ML IV BOLUS
INTRAVENOUS | Status: DC | PRN
  Administered 2018-04-21: 160 mg via INTRAVENOUS

## 2018-04-21 MED ORDER — PHENYLEPHRINE HCL 10 MG/ML IJ SOLN
INTRAMUSCULAR | Status: DC | PRN
  Administered 2018-04-21 (×2): 80 ug via INTRAVENOUS

## 2018-04-21 MED ORDER — IOPAMIDOL (ISOVUE-300) INJECTION 61%
INTRAVENOUS | Status: AC
  Filled 2018-04-21: qty 50

## 2018-04-21 MED ORDER — ACETAMINOPHEN 500 MG PO TABS
1000.0000 mg | ORAL_TABLET | Freq: Once | ORAL | Status: AC
  Administered 2018-04-21: 1000 mg via ORAL
  Filled 2018-04-21: qty 2

## 2018-04-21 SURGICAL SUPPLY — 13 items
BAG URO CATCHER STRL LF (MISCELLANEOUS) ×2 IMPLANT
GAUZE SPONGE 4X4 12PLY STRL (GAUZE/BANDAGES/DRESSINGS) ×2 IMPLANT
GLOVE BIOGEL M 6.5 STRL (GLOVE) ×4 IMPLANT
GLOVE BIOGEL M STRL SZ7.5 (GLOVE) ×2 IMPLANT
GOWN STRL REUS W/TWL XL LVL3 (GOWN DISPOSABLE) ×6 IMPLANT
GUIDEWIRE STR DUAL SENSOR (WIRE) ×2 IMPLANT
KIT BASIN OR (CUSTOM PROCEDURE TRAY) ×2 IMPLANT
PACK LITHOTOMY IV (CUSTOM PROCEDURE TRAY) ×2 IMPLANT
STENT 6X26 (STENTS) ×2 IMPLANT
SYR 30ML LL (SYRINGE) ×2 IMPLANT
SYR TOOMEY 50ML (SYRINGE) ×2 IMPLANT
TRAY FOLEY W/BAG SLVR 16FR (SET/KITS/TRAYS/PACK) ×1
TRAY FOLEY W/BAG SLVR 16FR ST (SET/KITS/TRAYS/PACK) ×1 IMPLANT

## 2018-04-21 NOTE — Progress Notes (Signed)
ED Doctor placed PIV

## 2018-04-21 NOTE — Consult Note (Addendum)
Urology Consult Note   Requesting Attending Physician:  Deno Etienne, DO Service Providing Consult: Urology  Consulting Attending: Bjorn Loser, MD   Reason for Consult: Left hydronephrosis and sepsis following ureteral stent removal  HPI: Marvin Gill is seen in consultation for reasons noted above at the request of Deno Etienne, DO for evaluation of sepsis and left hydronephrosis following ureteral stent removal earlier today..  This is a 67 y.o. 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 He also has a history of kidney stones, ED, chronic orchalgia, and BPH with LUTS. He was recently diagnosed with atrial fibrillation following his ureteroscopy on 03/03/18 and is now on eliqiuis (followed by Dr. Dorris Carnes).  He subsequently underwent cystoscopy with left ureteroscopy, holmium laser ablation of the left distal ureteral tumor, bladder biopsy, and left JJ stent placement on 04/07/18. 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   On 04/13/18, patient presented to ED with urinary retention and gross hematuria and urinary catheter was placed. He subsequently underwent future trial of void which he passed.  He returned to our office today and left ureteral stent was removed. He denied difficulty voiding at that time and was feeling well. Urine culture was sent. Pathology discussed and plan for BCG to start in 6 weeks was made.   This evening, patient presented to the ED due to overall sensation of not feeling well with leg pain and chills. He was initially stable and was given normal saline bolus. However, during his ED visit, the patient became acute tachycardic to the 160s and 180s and EKG demonstrated atrial fibrillation with RVR. Diltiazem drip was started and patient became febrile to 102 and hypotensive. Dilt gtt was discontinued and sepsis work-up was performed. Patient has now received over 4 L of fluid  boluses and BP improved to 94/62 (was 71/55 previously). He was empirically started on vancomycin/zosyn.  Patient reports that he has chronic back pain, but worsened back pain (L>R) while riding his tractor this afternoon. He started feeling worse while riding his tractor.  Lactate resulted at 4.46. Creatinine 1.05. WBC 9.7.  Urinalysis demonstrates: negative LE, negative nitrite, > 50 RBCs, 6-10 WBCs Most recently febrile to 103.1.   Foley catheter placed with 300 cc urine output.   CT abdomen pelvis obtained and it demonstrated persistent asymmetric left ureteral dilation and wall thickening. No obstructive calculus or high-grade ureteral obstruction or focal lesion identified.   Review of previous urine cultures demonstrated no growth.   Past Medical History: Past Medical History:  Diagnosis Date  . AF (paroxysmal atrial fibrillation) ([place]) 03/05/2018   new dx in settin sepsis  . Aneurysm of abdominal aorta ([facility])    a. ultasound 08-04-16 --- 3.1 CM X 3.1 CM - sees Dr. Laurann Montana - F/u due 2021.  Marland Kitchen Anxiety   . Arthritis    back  . Benign localized prostatic hyperplasia with lower urinary tract symptoms (LUTS)   . Bladder cancer ([place])   . Coronary artery disease primary cardiologist-- dr Dorris Carnes   a. s/p PTCA to dLAD 2000. b. Normal coronaries in 2003, patent prior PTCA site.  . Fibromyalgia   . GERD (gastroesophageal reflux disease)   . History of kidney stones   . History of skin cancer   . Hypothyroidism   . PONV (postoperative nausea and vomiting)    NAUSEA ALL DAY AFTER 02-01-17 SURGERY, PT WANTS ANESTHESIA LIKE 06-23-15 SURGERY WITH DR  TANNENBAUM  . S/P percutaneous transluminal coronary angioplasty    05-18-1999  to dLAD  . Wears glasses     Past Surgical History:  Past Surgical History:  Procedure Laterality Date  . CARDIAC CATHETERIZATION  06-14-2001  dr Einar Gip   normal coronary arteries and patent previous dLAD   . CARDIOVASCULAR STRESS TEST  06/12/2008   Low risk  nuclear study w/ no evidence ischemia/  normal LV funciton and wall motion , ef 60%  . COLONOSCOPY WITH PROPOFOL N/A 12/13/2013   Procedure: COLONOSCOPY WITH PROPOFOL;  Surgeon: Garlan Fair, MD;  Location: [facility];  Service: Endoscopy;  Laterality: N/A;  . CORONARY ANGIOPLASTY  05-18-1999  dr Tami Ribas   PTCA balloon to dLAD 80% (single vessel disease),  ef 60%  . CYSTOSCOPY WITH FULGERATION N/A 08/15/2017   Procedure: CYSTOSCOP/ BLADDER BIOPSY/TURBT;  Surgeon: Nickie Retort, MD;  Location: [facility];  Service: Urology;  Laterality: N/A;  . CYSTOSCOPY WITH INSERTION OF UROLIFT N/A 06/23/2015   Procedure: CYSTOSCOPY WITH INSERTION OF UROLIFT;  Surgeon: Carolan Clines, MD;  Location: [facility];  Service: Urology;  Laterality: N/A;  . CYSTOSCOPY WITH URETEROSCOPY AND STENT PLACEMENT Left 04/07/2018   Procedure: CYSTOSCOPY WITH URETEROSCOPY AND STENT EXCHANGE/ BLADDER BIOPSY/LASER ABLATION OF URETERAL TUMOR;  Surgeon: Ceasar Mons, MD;  Location: [facility];  Service: Urology;  Laterality: Left;  ONLY NEEDS 60 MIN  . CYSTOSCOPY/URETEROSCOPY/HOLMIUM LASER/STENT PLACEMENT Bilateral 02/01/2017   Procedure: CYSTOSCOPY/URETEROSCOPY/HOLMIUM LASER/STENT PLACEMENT,STONE BASKETRY, BLADDER BIOPSY;  Surgeon: Nickie Retort, MD;  Location[facility];  Service: Urology;  Laterality: Bilateral;  . ESOPHAGOGASTRODUODENOSCOPY  08-24-2006  . TRANSTHORACIC ECHOCARDIOGRAM  03/06/2018   mild LVH,  ef 55-60%,  pt in atrial fib. LV diastolic not evaluated/  mild LAE and RAE/  moderate AV calcification with sclerosis , no stenosis or regurg./  mild dilated aortic root, 39mm/  trival MR and TR  . TRANSURETHRAL INCISION OF PROSTATE N/A 04/01/2014   Procedure: TRANSURETHRAL INCISION OF THE PROSTATE (TUIP);  Surgeon: Ailene Rud, MD;  Location: [facility];  Service: Urology;  Laterality: N/A;    Medication: Current  Facility-Administered Medications  Medication Dose Route Frequency Provider Last Rate Last Dose  . iohexol (OMNIPAQUE) 300 MG/ML solution 100 mL  100 mL Intravenous Once PRN Curatolo, Adam, DO      . iopamidol (ISOVUE-370) 76 % injection           . lactated ringers bolus 1,000 mL  1,000 mL Intravenous Once Erskine Squibb, MD      . Derrill Memo ON 04/22/2018] vancomycin (VANCOCIN) IVPB 1000 mg/200 mL premix  1,000 mg Intravenous Q12H Willia Craze, Student-PharmD       Current Outpatient Medications  Medication Sig Dispense Refill  . ALPRAZolam (XANAX) 1 MG tablet Take 1 mg by mouth 2 (two) times daily.     Marland Kitchen apixaban (ELIQUIS) 5 MG TABS tablet Take 1 tablet (5 mg total) by mouth 2 (two) times daily. 60 tablet 11  . cephALEXin (KEFLEX) 500 MG capsule Take 1 capsule (500 mg total) by mouth 3 (three) times daily for 7 days. 21 capsule 0  . esomeprazole (NEXIUM) 40 MG capsule Take 40 mg by mouth every morning.     . gabapentin (NEURONTIN) 800 MG tablet Take 800 mg by mouth 3 (three) times daily.   3  . levothyroxine (SYNTHROID, LEVOTHROID) 50 MCG tablet Take 1 tablet (50 mcg total) by mouth daily before breakfast. (Patient taking  differently: Take 50 mcg by mouth daily before breakfast. ) 30 tablet 0  . pravastatin (PRAVACHOL) 40 MG tablet Take 40 mg by mouth every morning.   1  . tamsulosin (FLOMAX) 0.4 MG CAPS capsule Take 0.4 mg by mouth every morning.  9  . valACYclovir (VALTREX) 1000 MG tablet Take 1,000 mg by mouth 2 (two) times daily as needed. X 7 DAYS, THEN 500 MG BID X 6 MONTHS    . vitamin B-12 (CYANOCOBALAMIN) 1000 MCG tablet Take 1,000 mcg by mouth daily.    Marland Kitchen diltiazem (CARDIZEM CD) 180 MG 24 hr capsule Take 1 capsule (180 mg total) by mouth daily. (Patient not taking: Reported on 04/21/2018) 90 capsule 3  . HYDROcodone-acetaminophen (NORCO) 5-325 MG tablet Take 1 tablet by mouth every 4 (four) hours as needed for moderate pain. (Patient not taking: Reported on 04/21/2018) 20 tablet 0   . lip balm (CARMEX) ointment Apply topically as needed for lip care. (Patient not taking: Reported on 04/21/2018) 7 g 0  . ondansetron (ZOFRAN) 4 MG tablet Take 1 tablet (4 mg total) by mouth daily as needed for nausea or vomiting. (Patient not taking: Reported on 04/21/2018) 30 tablet 1  . phenazopyridine (PYRIDIUM) 200 MG tablet Take 1 tablet (200 mg total) by mouth 3 (three) times daily as needed (for pain with urination). (Patient not taking: Reported on 04/21/2018) 30 tablet 0    Allergies: Allergies  Allergen Reactions  . Codeine Nausea And Vomiting    "made my head feel funny too"    Social History: Social History   Tobacco Use  . Smoking status: Former Smoker    Packs/day: 1.00    Years: 25.00    Pack years: 25.00    Types: Cigarettes    Last attempt to quit: 06/01/2003    Years since quitting: 14.8  . Smokeless tobacco: Never Used  Substance Use Topics  . Alcohol use: No    Frequency: Never  . Drug use: No    Family History Family History  Problem Relation Age of Onset  . Anuerysm Mother        Brain    Review of Systems 10 systems were reviewed and are negative except as noted specifically in the HPI.  Objective   Vital signs in last 24 hours: BP (!) 96/57   Pulse (!) 147   Temp (!) 103.1 F (39.5 C) (Oral)   Resp (!) 22   SpO2 (!) 88%   Physical Exam General: patient actively rigoring, looks uncomfortable HEENT: [place]/AT, EOMI, MMM, eyes with petechiae Pulmonary: Normal work of breathing Cardiovascular: HDS, adequate peripheral perfusion Abdomen: Soft, NTTP, nondistended, shark tattoo on abdomen. GU: Very mild left CVA tenderness Extremities: warm and well perfused Neuro: Appropriate, no focal neurological deficits  Most Recent Labs: Lab Results  Component Value Date   WBC 9.7 04/21/2018   HGB 14.6 04/21/2018   HCT 46.9 04/21/2018   PLT 280 04/21/2018    Lab Results  Component Value Date   NA 141 04/21/2018   K 3.4 (L) 04/21/2018   CL  107 04/21/2018   CO2 23 04/21/2018   BUN 12 04/21/2018   CREATININE 1.05 04/21/2018   CALCIUM 9.1 04/21/2018   MG 2.0 03/09/2018   PHOS 3.0 03/09/2018    Lab Results  Component Value Date   INR 0.95 03/06/2018   APTT 37 (H) 03/06/2018     IMAGING: Dg Chest Portable 1 View  Result Date: 04/21/2018 CLINICAL DATA:  Fever with low  blood pressure and tachycardia. History of bladder cancer and atrial fibrillation. EXAM: PORTABLE CHEST 1 VIEW COMPARISON:  Radiographs 03/05/2018 and 11/30/2017. FINDINGS: 1555 hours. Two views were obtained. The heart size and mediastinal contours are stable. There is mild biapical scarring. The lungs are otherwise clear. There is no pleural effusion or pneumothorax. Slight tracheal deviation to the right appears stable. No acute osseous findings are seen. Multiple telemetry leads overlie the chest. IMPRESSION: No active cardiopulmonary process. Electronically Signed   By: Richardean Sale M.D.   On: 04/21/2018 16:14   Ct Angio Chest/abd/pel For Dissection W And/or Wo Contrast  Result Date: 04/21/2018 CLINICAL DATA:  Generalized abdominal pain with vomiting. Recent ureteral stent removal. Aortic dissection suspected. EXAM: CT ANGIOGRAPHY CHEST, ABDOMEN AND PELVIS TECHNIQUE: Multidetector CT imaging through the chest, abdomen and pelvis was performed using the standard protocol during bolus administration of intravenous contrast. Multiplanar reconstructed images and MIPs were obtained and reviewed to evaluate the vascular anatomy. CONTRAST:  149mL ISOVUE-370 IOPAMIDOL (ISOVUE-370) INJECTION 76% COMPARISON:  Chest radiographs 04/21/2018. Abdominopelvic CT 02/08/2018. FINDINGS: CTA CHEST FINDINGS Cardiovascular: Pre contrast images demonstrate minimal atherosclerosis of the aorta and great vessels. There are calcifications of the aortic valve. Following contrast, the aorta opacifies normally. There is no evidence of aortic aneurysm or dissection. The pulmonary arteries  are well opacified with contrast. There is no evidence of acute pulmonary embolism. The heart size is normal. There is no pericardial effusion. Mediastinum/Nodes: There are small right hilar lymph nodes. There are no enlarged mediastinal or axillary lymph nodes. The thyroid gland, trachea and esophagus demonstrate no significant findings. Lungs/Pleura: There is no pleural effusion or pneumothorax. There is mild emphysema with mild biapical and bibasilar scarring. There are scattered small pulmonary nodules, including a subpleural nodule in the right lower lobe measuring 5 mm on image 80/7 which is unchanged from previous abdominal CT. In the lingula, there is a 4 mm nodule on image 52/7. Musculoskeletal/Chest wall: There is no chest wall mass or suspicious osseous finding. Review of the MIP images confirms the above findings. CTA ABDOMEN AND PELVIS FINDINGS VASCULAR Aorta: Minimal atherosclerosis.  No aneurysm or dissection. Celiac: Mild calcification at the ostium.  No occlusion or aneurysm. SMA: Normal. Renals: Normal. IMA: Normal Inflow: Minimal iliac atherosclerosis without aneurysm or stenosis. Veins: Limited opacification.  No abnormalities identified. Review of the MIP images confirms the above findings. NON-VASCULAR Hepatobiliary: The pre contrast images demonstrate decreased hepatic density consistent with steatosis. Following contrast. No focal lesion or abnormal enhancement demonstrated. No evidence of gallstones, gallbladder wall thickening or biliary dilatation. Pancreas: Unremarkable. No pancreatic ductal dilatation or surrounding inflammatory changes. Spleen: Normal in size without focal abnormality. Adrenals/Urinary Tract: Both adrenal glands appear normal. The right kidney appears stable with a nonobstructing 9 mm calculus in its lower pole on image 150/6. There is an adjacent simple cyst. There is no right-sided hydronephrosis or ureteral calculus. No focal left renal abnormality is identified.  There is new perinephric and periureteral soft tissue stranding on the left associated with persistent mild dilatation of the left ureter. Left ureteral stent seen on recent radiographs has been removed. There is persistent ureteral wall thickening and hyperenhancement. This protocol does not include delayed post-contrast images through the ureters. Foley catheter is in place. The bladder is incompletely distended with mild wall thickening. No definite stone fragments in either ureter or the bladder. Stomach/Bowel: No evidence of bowel wall thickening, distention or surrounding inflammatory change. The appendix appears normal. Lymphatic: There are stable small  lymph nodes within the porta hepatis. Reproductive: Fiducial markers again noted in the prostate gland. No abnormality of the seminal vesicle seen. Other: Stable mild fat in both inguinal canals. Musculoskeletal: No acute or significant osseous findings. Mild lumbar spondylosis. Old rib fractures on the left. Review of the MIP images confirms the above findings. IMPRESSION: 1. No evidence of aortic dissection, aneurysm or large vessel occlusion. 2. Persistent asymmetric left ureteral dilatation and wall thickening following ureteral stent removal. No obstructing calculus identified. This protocol does not include delayed imaging through the ureters, but no high-grade ureteral obstruction or focal urothelial lesion identified. Urology follow-up recommended. 3. Mild Aortic Atherosclerosis (ICD10-I70.0). Calcifications of the aortic valve. 4.  Emphysema (ICD10-J43.9). 5. Scattered small pulmonary nodules, nonspecific. Those at the lung bases appear chronic and unchanged. No routine follow-up is necessary in a low risk patient per consensus guidelines. Electronically Signed   By: Richardean Sale M.D.   On: 04/21/2018 18:53    ------  Assessment:  67 y.o. male with a history of low grade urothelial carcinoma in his left distal ureter who underwent left  ureteral stent removal earlier today and subsequently presented to the ED and developed fevers, tachycardia, and hypotension refractory to over 4L of resuscitative fluids. CT demonstrates moderate left hydronephrosis and hydroureter to the level of the distal ureter / bladder without high grade obstruction. He does not have any right urinary tract dilation. While it is possible to have mild hydronephrosis while a ureteral stent is in place and this may have not resolved in the short time period, the amount of hydronephrosis on CT scan today is more than I would expect if it were residual from his previous stent. Instead, it seems more likely that the patient has hydronephrosis following left ureteral stent placement due to ongoing ureteral obstruction. While his creatinine is normal, he has developed fluid-refractory hypotension and sepsis which is likely urinary in origin.   We discussed management options for his ureteral obstruction and, given likely urinary tract infection resulting in sepsis, recommended decompression of his left renal collecting system. We discussed the rational for this recommendation and that it can be accomplished with either nephrostomy tube or ureteral stent. The patient took his eliquis this morning. Discussed possible nephrostomy placement with interventional radiology, who recommended at least 24 hours between last dose of eliquis and PCN placement. Since patient took eliquis this AM, it is currently unsafe to proceed with PCN. Therefore, we recommended cystoscopy with ureteral stent placement. Discussed risks, benefits, alternatives, including risk of worsening infection and death with ureteral stent placement. Patient and his daughter would like to proceed with the procedure.   Discussed patient with anesthesia given hypotension and risk of anesthesia and they are okay to proceed with procedure tonight.  Recommendations: - Recommend OR for placement of left ureteral stent  emergently tonight. - Follow-up blood and urine cultures. - Appreciate the critical care team's assistance with this patient.  - Please keep patient NPO pending procedure.   Thank you for this consult. Please contact the urology consult pager with any further questions/concerns.  After a thorough review of the management options for the patient's condition the patient  elected to proceed with surgical therapy as noted above. We have discussed the potential benefits and risks of the procedure, side effects of the proposed treatment, the likelihood of the patient achieving the goals of the procedure, and any potential problems that might occur during the procedure or recuperation. Informed consent has been obtained.I performed a  history and physical examination of the patient and discussed his management with the resident.  I reviewed the resident's note and agree with the documented findings and plan of care    I performed a history and physical examination of the patient and discussed his management with the resident.  I reviewed the resident's note and agree with the documented findings and plan of care    Reviewed surgery in detail including failure to pass stent/injury and medical and surgical sequelae and worsening sepsis  Picture drawn

## 2018-04-21 NOTE — ED Provider Notes (Signed)
[facility] EMERGENCY DEPARTMENT Provider Note   CSN: [id number] Arrival date & time: 04/21/18  1401     History   Chief Complaint Chief Complaint  Patient presents with  . Abdominal Pain    N/V    HPI Marvin Gill is a 67 y.o. male with history of paroxysmal A. fib, AAA, BPH, bladder cancer, CAD, fibromyalgia, nephrolithiasis presents for evaluation of acute onset, progressively worsening pain "all over ".  Yesterday he had a Foley catheter removed from his bladder.  Earlier today he had an in office cystoscopy and left JJ stent removal with Dr. Lovena Neighbours the urologist.  He states that he tolerated the procedure without difficulty and was discharged home at around 11 AM. He states he has not passed any urine since the procedure, but did pass urine after the foley removal yesterday.  He states that he went home and was mowing the lawn when he began to feel unwell.  He reports that he went inside his home and began to feel pain all over his body from his chest to his abdomen to his legs.  He states the leg pain feels consistent with his usual fibromyalgia pains.  He has had a few episodes of nonbloody nonbilious emesis, nausea improved with the Zofran.  Denies fevers, cough, sore throat.   States he has a history of paroxysmal A. fib which was diagnosed when he was septic in October.  He is currently on Eliquis and diltiazem.  To his knowledge he has not been in A. fib again since then.  The history is provided by the patient.    Past Medical History:  Diagnosis Date  . AF (paroxysmal atrial fibrillation) ([place]) 03/05/2018   new dx in settin sepsis  . Aneurysm of abdominal aorta ([facility])    a. ultasound 08-04-16 --- 3.1 CM X 3.1 CM - sees Dr. Laurann Montana - F/u due 2021.  Marland Kitchen Anxiety   . Arthritis    back  . Benign localized prostatic hyperplasia with lower urinary tract symptoms (LUTS)   . Bladder cancer ([place])   . Coronary artery disease primary cardiologist-- dr Dorris Carnes    a. s/p PTCA to dLAD 2000. b. Normal coronaries in 2003, patent prior PTCA site.  . Fibromyalgia   . GERD (gastroesophageal reflux disease)   . History of kidney stones   . History of skin cancer   . Hypothyroidism   . PONV (postoperative nausea and vomiting)    NAUSEA ALL DAY AFTER 02-01-17 SURGERY, PT WANTS ANESTHESIA LIKE 06-23-15 SURGERY WITH DR  [name]  . S/P percutaneous transluminal coronary angioplasty    05-18-1999  to dLAD  . Wears glasses     Patient Active Problem List   Diagnosis Date Noted  . Orthostasis 03/08/2018  . Malnutrition of moderate degree 03/07/2018  . Sepsis ([place]) 03/05/2018  . AKI (acute kidney injury) ([place]) 03/05/2018  . Benign prostate hyperplasia 04/01/2014    Past Surgical History:  Procedure Laterality Date  . CARDIAC CATHETERIZATION  06-14-2001  dr Einar Gip   normal coronary arteries and patent previous dLAD   . CARDIOVASCULAR STRESS TEST  06/12/2008   Low risk nuclear study w/ no evidence ischemia/  normal LV funciton and wall motion , ef 60%  . COLONOSCOPY WITH PROPOFOL N/A 12/13/2013   Procedure: COLONOSCOPY WITH PROPOFOL;  Surgeon: Garlan Fair, MD;  Location: [facility];  Service: Endoscopy;  Laterality: N/A;  . CORONARY ANGIOPLASTY  05-18-1999  dr Tami Ribas   PTCA balloon  to dLAD 80% (single vessel disease),  ef 60%  . CYSTOSCOPY WITH FULGERATION N/A 08/15/2017   Procedure: CYSTOSCOP/ BLADDER BIOPSY/TURBT;  Surgeon: Nickie Retort, MD;  Location: [facility];  Service: Urology;  Laterality: N/A;  . CYSTOSCOPY WITH INSERTION OF UROLIFT N/A 06/23/2015   Procedure: CYSTOSCOPY WITH INSERTION OF UROLIFT;  Surgeon: Carolan Clines, MD;  Location: [facility];  Service: Urology;  Laterality: N/A;  . CYSTOSCOPY WITH URETEROSCOPY AND STENT PLACEMENT Left 04/07/2018   Procedure: CYSTOSCOPY WITH URETEROSCOPY AND STENT EXCHANGE/ BLADDER BIOPSY/LASER ABLATION OF URETERAL TUMOR;  Surgeon: Ceasar Mons, MD;  Location: [facility];  Service: Urology;  Laterality: Left;  ONLY NEEDS 60 MIN  . CYSTOSCOPY/URETEROSCOPY/HOLMIUM LASER/STENT PLACEMENT Bilateral 02/01/2017   Procedure: CYSTOSCOPY/URETEROSCOPY/HOLMIUM LASER/STENT PLACEMENT,STONE BASKETRY, BLADDER BIOPSY;  Surgeon: Nickie Retort, MD;  Location: [facility];  Service: Urology;  Laterality: Bilateral;  . ESOPHAGOGASTRODUODENOSCOPY  08-24-2006  . TRANSTHORACIC ECHOCARDIOGRAM  03/06/2018   mild LVH,  ef 55-60%,  pt in atrial fib. LV diastolic not evaluated/  mild LAE and RAE/  moderate AV calcification with sclerosis , no stenosis or regurg./  mild dilated aortic root, 65mm/  trival MR and TR  . TRANSURETHRAL INCISION OF PROSTATE N/A 04/01/2014   Procedure: TRANSURETHRAL INCISION OF THE PROSTATE (TUIP);  Surgeon: Ailene Rud, MD;  Location: [facility];  Service: Urology;  Laterality: N/A;        Home Medications    Prior to Admission medications   Medication Sig Start Date End Date Taking? Authorizing Provider  ALPRAZolam Duanne Moron) 1 MG tablet Take 1 mg by mouth 2 (two) times daily.     [provider]  apixaban (ELIQUIS) 5 MG TABS tablet Take 1 tablet (5 mg total) by mouth 2 (two) times daily. 04/10/18   Fay Records, MD  cephALEXin (KEFLEX) 500 MG capsule Take 1 capsule (500 mg total) by mouth 3 (three) times daily for 7 days. 04/16/18 04/23/18  Duffy Bruce, MD  diltiazem (CARDIZEM CD) 180 MG 24 hr capsule Take 1 capsule (180 mg total) by mouth daily. 04/17/18 07/16/18  Fay Records, MD  esomeprazole (NEXIUM) 40 MG capsule Take 40 mg by mouth every morning.     [provider]  gabapentin (NEURONTIN) 800 MG tablet Take 800 mg by mouth 3 (three) times daily.  12/30/17   [provider]  HYDROcodone-acetaminophen (NORCO) 5-325 MG tablet Take 1 tablet by mouth every 4 (four) hours as needed for moderate pain. 04/07/18   Ceasar Mons, MD  levothyroxine (SYNTHROID,  LEVOTHROID) 50 MCG tablet Take 1 tablet (50 mcg total) by mouth daily before breakfast. Patient taking differently: Take 50 mcg by mouth daily before breakfast.  03/10/18   Raiford Noble Latif, DO  lip balm (CARMEX) ointment Apply topically as needed for lip care. 03/09/18   Sheikh, Omair Latif, DO  ondansetron (ZOFRAN) 4 MG tablet Take 1 tablet (4 mg total) by mouth daily as needed for nausea or vomiting. 04/07/18 04/07/19  Ceasar Mons, MD  phenazopyridine (PYRIDIUM) 200 MG tablet Take 1 tablet (200 mg total) by mouth 3 (three) times daily as needed (for pain with urination). 04/07/18 04/07/19  Ceasar Mons, MD  pravastatin (PRAVACHOL) 40 MG tablet Take 40 mg by mouth every morning.  05/07/15   [provider]  valACYclovir (VALTREX) 1000 MG tablet Take 1,000 mg by mouth 2 (two) times daily as needed. X 7 DAYS, THEN 500 MG BID  X 6 MONTHS    [provider]  vitamin B-12 (CYANOCOBALAMIN) 1000 MCG tablet Take 1,000 mcg by mouth daily.    [provider]    Family History Family History  Problem Relation Age of Onset  . Anuerysm Mother        Brain    Social History Social History   Tobacco Use  . Smoking status: Former Smoker    Packs/day: 1.00    Years: 25.00    Pack years: 25.00    Types: Cigarettes    Last attempt to quit: 06/01/2003    Years since quitting: 14.8  . Smokeless tobacco: Never Used  Substance Use Topics  . Alcohol use: No    Frequency: Never  . Drug use: No     Allergies   Codeine   Review of Systems Review of Systems  Constitutional: Negative for chills and fever.  Respiratory: Negative for shortness of breath.   Cardiovascular: Positive for chest pain.  Gastrointestinal: Positive for abdominal pain, nausea and vomiting.  Musculoskeletal: Positive for arthralgias, back pain and myalgias.  All other systems reviewed and are negative.    Physical Exam Updated Vital Signs BP (!) 141/130   Pulse (!) 152    Temp (!) 102.4 F (39.1 C) (Oral)   Resp 16   SpO2 98%   Physical Exam  Constitutional: He appears well-developed and well-nourished. No distress.  Sitting supine in bed, shaking all over  HENT:  Head: Normocephalic and atraumatic.  Dry mucous membranes  Eyes: Conjunctivae are normal. Right eye exhibits no discharge. Left eye exhibits no discharge.  Neck: No JVD present. No tracheal deviation present.  Cardiovascular: Normal rate, regular rhythm, normal heart sounds and intact distal pulses.  2+ radial and DP/PT pulses bilaterally, Homans sign absent bilaterally, no lower extremity edema, no palpable cords, compartments are soft   Pulmonary/Chest: Effort normal and breath sounds normal.  Abdominal: Soft. He exhibits no distension. There is no tenderness. There is no rigidity, no rebound and no guarding.  Musculoskeletal: He exhibits no edema.  5/5 strength of BUE and BLE major muscle groups. No midline spine TTP, no paraspinal muscle tenderness, no deformity, crepitus, or step-off noted   Neurological: He is alert.  Fluent speech, no facial droop, sensation intact to soft touch of bilateral upper and lower extremities  Skin: Skin is warm and dry. No erythema.  Psychiatric: He has a normal mood and affect. His behavior is normal.  Nursing note and vitals reviewed.     ED Treatments / Results  Labs (all labs ordered are listed, but only abnormal results are displayed) Labs Reviewed  CBC WITH DIFFERENTIAL/PLATELET - Abnormal; Notable for the following components:      Result Value   Neutro Abs 8.7 (*)    All other components within normal limits  COMPREHENSIVE METABOLIC PANEL - Abnormal; Notable for the following components:   Potassium 3.4 (*)    Glucose, Bld 108 (*)    All other components within normal limits  I-STAT CG4 LACTIC ACID, ED - Abnormal; Notable for the following components:   Lactic Acid, Venous 4.46 (*)    All other components within normal limits  CULTURE,  BLOOD (ROUTINE X 2)  CULTURE, BLOOD (ROUTINE X 2)  URINE CULTURE  LIPASE, BLOOD  URINALYSIS, ROUTINE W REFLEX MICROSCOPIC  I-STAT TROPONIN, ED    EKG EKG Interpretation  Date/Time:  Friday April 21 2018 15:32:36 EST Ventricular Rate:  162 PR Interval:    QRS Duration:  154 QT Interval:  341 QTC Calculation: 560 R Axis:   -85 Text Interpretation:  Sinus tachycardia Reconfirmed by Lennice Sites [phone]) on 04/21/2018 3:45:53 PM   Radiology Dg Chest Portable 1 View  Result Date: 04/21/2018 CLINICAL DATA:  Fever with low blood pressure and tachycardia. History of bladder cancer and atrial fibrillation. EXAM: PORTABLE CHEST 1 VIEW COMPARISON:  Radiographs 03/05/2018 and 11/30/2017. FINDINGS: 1555 hours. Two views were obtained. The heart size and mediastinal contours are stable. There is mild biapical scarring. The lungs are otherwise clear. There is no pleural effusion or pneumothorax. Slight tracheal deviation to the right appears stable. No acute osseous findings are seen. Multiple telemetry leads overlie the chest. IMPRESSION: No active cardiopulmonary process. Electronically Signed   By: Richardean Sale M.D.   On: 04/21/2018 16:14    Procedures .Critical Care Performed by: Renita Papa, PA-C Authorized by: Renita Papa, PA-C   Critical care provider statement:    Critical care time (minutes):  60   Critical care was necessary to treat or prevent imminent or life-threatening deterioration of the following conditions:  Sepsis   Critical care was time spent personally by me on the following activities:  Discussions with consultants, evaluation of patient's response to treatment, examination of patient, ordering and performing treatments and interventions, ordering and review of laboratory studies, ordering and review of radiographic studies, pulse oximetry, re-evaluation of patient's condition, obtaining history from patient or surrogate and review of old charts   I assumed  direction of critical care for this patient from another provider in my specialty: no     (including critical care time)  Medications Ordered in ED Medications  piperacillin-tazobactam (ZOSYN) IVPB 3.375 g (3.375 g Intravenous New Bag/Given 04/21/18 1714)  vancomycin (VANCOCIN) 1,500 mg in sodium chloride 0.9 % 500 mL IVPB (has no administration in time range)  vancomycin (VANCOCIN) IVPB 1000 mg/200 mL premix (has no administration in time range)  iohexol (OMNIPAQUE) 300 MG/ML solution 100 mL (has no administration in time range)  sodium chloride 0.9 % bolus 1,000 mL (has no administration in time range)  sodium chloride 0.9 % bolus 1,000 mL (has no administration in time range)  sodium chloride 0.9 % bolus 1,000 mL (has no administration in time range)  iopamidol (ISOVUE-370) 76 % injection (has no administration in time range)  fentaNYL (SUBLIMAZE) injection 100 mcg (100 mcg Intravenous Given 04/21/18 1440)  sodium chloride 0.9 % bolus 1,000 mL (1,000 mLs Intravenous New Bag/Given 04/21/18 1542)  HYDROmorphone (DILAUDID) injection 1 mg (1 mg Intravenous Given 04/21/18 1709)  acetaminophen (TYLENOL) tablet 1,000 mg (1,000 mg Oral Given 04/21/18 1633)  lidocaine (XYLOCAINE) 2 % jelly 1 application (1 application Topical Given 04/21/18 1629)  diltiazem (CARDIZEM) 1 mg/mL load via infusion 10 mg (10 mg Intravenous Bolus from Bag 04/21/18 1645)     Initial Impression / Assessment and Plan / ED Course  I have reviewed the triage vital signs and the nursing notes.  Pertinent labs & imaging results that were available during my care of the patient were reviewed by me and considered in my medical decision making (see chart for details).     Patient presents for evaluation of generalized body aches.  Unable to reproduce pain on palpation.  Recently had JJ stent removed in urology office earlier today prior to symptom onset.  Initially afebrile with otherwise stable vital signs.  Initially  uncomfortable but nontoxic in appearance.  He did appear dehydrated and while in the ED the patient  became tachycardic up to 180 bpm when sat upright.  He noted feeling lightheaded and experiencing palpitations during this time.  He has a history of A. fib with RVR in the setting of sepsis in the past.  He was put on diltiazem drip and fluid bolus.   He then became hypotensive and upon recheck had an oral temp of 102.4 F which was not present on initial assessment. Patient with 300cc on bladder scan. He was able to pass a very small amount of urine; foley catheter placed.   Lab work with no leukocytosis, no renal insufficiency, normal troponin.  Does have an elevated lactate of 4.46.  Concern for urosepsis versus bladder perforation.  Code sepsis was called, broad-spectrum antibiotics and further fluid resuscitation initiated.  His blood pressure corrected with discontinuation of Cardizem drip and fluid bolus. On reassessment, patient is primarily complaining of back pain and bilateral lower extremity pain.  No focal neurologic deficits, no concern for cauda equina or spinal abscess. Will obtain dissection study for further evaluation.  5:23 PM Signed out to oncoming provider Dr. Fabio Bering.  Awaiting results of imaging and reassessment.  Will require admission for sepsis.  Final Clinical Impressions(s) / ED Diagnoses   Final diagnoses:  Sepsis, due to unspecified organism, unspecified whether acute organ dysfunction present [facility])    ED Discharge Orders    None       Renita Papa, PA-C 04/21/18 Rosa Sanchez, [place], DO 04/21/18 1745

## 2018-04-21 NOTE — Progress Notes (Signed)
CRITICAL VALUE ALERT  Critical Value:  Lactic acid 3.2  Date & Time Notied:  04/21/18 2325  Provider Notified: Warren Lacy  Orders Received/Actions taken:

## 2018-04-21 NOTE — ED Triage Notes (Signed)
Pt arrived via [facility]; pt from [facility] with c/o abd pn; bladder stent removal today and finished approx 11am; pt went home to do yard work and started having abd pain now it is everywhere; pt vomited 300cc; 4mg  Zofran IM administered; 114/73, 86, 95% on RA

## 2018-04-21 NOTE — Progress Notes (Signed)
Pharmacy Antibiotic Note  Marvin Gill is a 67 y.o. male admitted on 04/21/2018 with UTI.  Pharmacy has been consulted for vancomycin dosing.  Of note, patient had foley catheter removed from bladder yesterday as well as office cystoscopy and left JJ stent removal today.  Currently, febrile (Tmax 102.4) and WBC wnl. Scr 1.05 mg/dL (BL ~0.8 mg/dL).   Plan: Vancomycin 1500 mg IV once, then 1000 mg IV q12h Zosyn 3.375 gm IV once per MD Monitor renal function and vancomycin trough at steady-state F/u cultures, LOT, and maintenance gram negative coverage       Temp (24hrs), Avg:100.3 F (37.9 C), Min:98.1 F (36.7 C), Max:102.4 F (39.1 C)  Recent Labs  Lab 04/16/18 1335 04/21/18 1424  WBC 6.3 9.7  CREATININE 0.79 1.05    Estimated Creatinine Clearance: 87 mL/min (by C-G formula based on SCr of 1.05 mg/dL).    Allergies  Allergen Reactions  . Codeine Nausea And Vomiting    "made my head feel funny too"    Antimicrobials this admission: Zosyn 11/22 x1 Vancomycin 11/22>>  Microbiology results: 11/22 UCx: sent 11/22 BCx: sent  Thank you for allowing pharmacy to be a part of this patient's care.  Willia Craze, Pharmacy Student

## 2018-04-21 NOTE — ED Provider Notes (Addendum)
Medical screening examination/treatment/procedure(s) were conducted as a shared visit with non-physician practitioner(s) and myself.  I personally evaluated the patient during the encounter. Briefly, the patient is a 67 y.o. 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  Patient with unremarkable vitals upon my initial evaluation.  Patient has already been seen by physician assistant.  Patient upon my evaluation states that he has pain in his legs but they are not reproducible.  Patient recently had urinary catheter in place and had a stent placed in 1 of his ureters and have that removed today by urology.  Patient states that he was mowing his lawn when he all of a sudden began to have pain in his leg, chills.  He states that he is on an antibiotic but he does not know what antibiotic.  Patient overall on exam appears distressed but has no abdominal tenderness on exam, strong pulses throughout, no tenderness in his legs.  Patient appears agitated but overall the exam is unremarkable.  labs were ordered and patient was given IV fentanyl for pain.  Patient did appear to be dry on exam as his mucous membranes were dry.  Patient given normal saline bolus.  During work-up patient acutely became tachycardic to the 160s.  Multiple EKGs were obtained that showed possibly sinus tachycardia.  Eventually patient became tachycardic to the 180s and appeared to be atrial fibrillation with RVR.  At that time diltiazem drip was initiated as well as an IV diltiazem bolus.  Shortly after this was initiated patient was found to have a fever of 102 and got briefly hypotensive.  At that time diltiazem was discontinued.  And sepsis work-up was initiated as patient with fever and tachycardia.  It appears that patient was in atrial  fibrillation in the past was due to sepsis.  At that time blood cultures were collected, urine culture, lactic acid was ordered.  Patient given additional normal saline bolus.  Patient with improvement of blood pressure after diltiazem drip was discontinued.  Patient empirically started on IV antibiotics as well.  Lactic acid came back at 4.46.  Therefore 30 cc/kg of fluids was ordered.  Patient had no significant leukocytosis, anemia, electrolyte abnormality.  Lipase within normal limits.  Patient appears to have urinary retention as well and urine Foley was placed as bladder scan showed about 300 cc of urine after patient tried to fully void.  X-ray showed no signs of pneumonia.  Patient was signed out to oncoming ED staff with patient pending urinalysis. Suspect urinary source.   Patient started to complain of worsening abdominal pain, low back pain and given his history of AAA and recent instrumentation of his bladder CT, dissection study was ordered to rule out other causes of hypotension, fever sources.  Please see oncoming ED provider's note for further results, evaluation, disposition of the patient.  This chart was dictated using voice recognition software.  Despite best efforts to proofread,  errors can occur which can change the documentation meaning.   .Critical Care Performed by: Lennice Sites, DO Authorized by: Lennice Sites, DO   Critical care provider statement:    Critical care time (minutes):  45   Critical care time was exclusive of:  Separately billable procedures and treating other patients and teaching time   Critical care was necessary to treat or prevent imminent or life-threatening  deterioration of the following conditions:  Sepsis and cardiac failure (afib w rvr)   Critical care was time spent personally by me on the following activities:  Development of treatment plan with patient or surrogate, evaluation of patient's response to treatment, examination of patient,  obtaining history from patient or surrogate, ordering and performing treatments and interventions, ordering and review of laboratory studies, ordering and review of radiographic studies, pulse oximetry, re-evaluation of patient's condition and review of old charts   I assumed direction of critical care for this patient from another provider in my specialty: no     EMERGENCY DEPARTMENT  US GUIDANCE EXAM Emergency Ultrasound:  US Guidance for Needle Guidance  INDICATIONS: Difficult vascular access Linear probe used in real-time to visualize location of needle entry through skin.   PERFORMED BY: Myself IMAGES ARCHIVED?: Yes LIMITATIONS: None VIEWS USED: Transverse INTERPRETATION: Needle visualized within vein     EKG Interpretation  Date/Time:  Friday April 21 2018 16:05:56 EST Ventricular Rate:  178 PR Interval:    QRS Duration: 74 QT Interval:  262 QTC Calculation: 463 R Axis:   67 Text Interpretation:  Afib/flut and V-paced complexes No further rhythm analysis attempted due to paced rhythm ST depression, probably rate related Confirmed by Lennice Sites [phone]) on 04/21/2018 5:27:13 PM           Lennice Sites, DO 04/21/18 [place], [place], DO 04/21/18 1745

## 2018-04-21 NOTE — ED Notes (Signed)
Pt just returned from c-t  3rd loter of fluid  Half infused  [name] still going

## 2018-04-21 NOTE — ED Notes (Signed)
Admitting doctors at the bedside  Critical care here also

## 2018-04-21 NOTE — ED Notes (Signed)
Dr Tyrone Nine informed of lactic acid results 4.93

## 2018-04-21 NOTE — Op Note (Signed)
Preoperative diagnosis: Left hydronephrosis with sepsis Postoperative diagnosis: Left hydronephrosis and sepsis Surgery: Cystoscopy and insertion of left ureteral stent Surgeon: Dr. Nicki Reaper Azriel Jakob  The patient has the above diagnosis and consented the above procedure.  Preoperative assessment was thoroughly documented.  Patient had a low blood pressure.  He was on Eliquis.  Patient was prepped and draped in usual fashion.  He is on IV antibiotics.  Anesthesia was supporting his blood pressure.  46 French cystoscope was utilized.  Penile bulbar membranous urethra normal.  He bilobar enlargement of the prostate.  He had mild elevation of the bladder neck.  On cystoscopy the right ureteral orifice looked normal.  The majority of the bladder was normal.  To the left side there was yellowish almost necrotic tissue retracted around likely a left ureteral orifice.  The trigone was a little bit tented or distracted to the left.  Initially under fluoroscopy dye could be seen in a moderately distended ureter from the CT scan.  We were able to easily passed a sensor wire up through the ureteral orifice identified above throughout the ureter curling in the upper pole calyx.  Over the wire using fluoroscopic and cystoscopic guidance I passed a 26 cm x 6 French stent.  There was mild urine output.  X-rays were taken.  It was in good position.  79 French Foley catheter was inserted and draining.  Vision will be treated in the intensive care unit for urosepsis

## 2018-04-21 NOTE — ED Notes (Signed)
Scanner at the bedside is not working every time  Numerus attempts at getting the scanner to scan   unsuccessful

## 2018-04-21 NOTE — H&P (Addendum)
NAME:  Marvin Gill, MRN:  [id number], DOB:  [birth year], LOS: 0 ADMISSION DATE:  04/21/2018, CONSULTATION DATE:  11/22 REFERRING MD:  Dr. Tyrone Nine EDP, CHIEF COMPLAINT:  Sepsis   Brief History   67 year old male status post ureteral stent removal presents later same day with abdominal pain, fevers, chills. CT with ureteral edema and possible obstruction. Borderline shock. Admit for suspected urosepsis on pressors with urology consulting.   History of present illness   67 year old male with PMH as below, which is significant for atrial fibrillation on eliquis, AAA, urothelial carcinoma, Fibromyalgia, and CAD. He was recently diagnosed with both urothelial carcinoma and atrial fibrillaion. AF treated with diltiazem and eliquis. He has been followed by urology and underwent cystoscopy with left ureteroscopy, holmium laser ablation of the left distal ureteral tumor, bladder biopsy, and left JJ stent placement on 04/07/18. Marland Kitchen He had an admission since that time for urologic infection from 10/6-10/10. He was treated with antibiotics and discharged on keppra. There was some concern he may have been bacteremic, but this was never proven by culture.   He had stent removed 11/22 as an outpatient procedure. Shortly after removal he returned home and attempted to mow the yard, but developed severe abdominal pain. Also developed leg pain and chills. In the ED he was found to be tachycardic, which was thought to represent AF and was started on diltiazem infusion. He then became bradycaric and developed fever, hypotension. Lactic elevated. He was then felt to be septic and given 4L IVF, but remained tachycardic. Repeat lactic elevated further. CT of the abdomen demonstrated left ureteral dialtion and wall thickening. No obvious obstruction, but obstruction not ruled out. He was treated with broad spectrum antibiotics and PCCM was asked to admit.   Past Medical History   has a past medical history of AF (paroxysmal  atrial fibrillation) ([place]) (03/05/2018), Aneurysm of abdominal aorta (HCC), Anxiety, Arthritis, Benign localized prostatic hyperplasia with lower urinary tract symptoms (LUTS), Bladder cancer ([place]), Coronary artery disease (primary cardiologist-- dr Dorris Carnes), Fibromyalgia, GERD (gastroesophageal reflux disease), History of kidney stones, History of skin cancer, Hypothyroidism, PONV (postoperative nausea and vomiting), S/P percutaneous transluminal coronary angioplasty, and Wears glasses.  Significant Hospital Events   11/22 admit  Consults:  Urology 11/22 >  Procedures:    Significant Diagnostic Tests:  CT angio abdomen/pelvis/chest 11/22 > Persistent asymmetric left ureteral dilatation and wall thickening following ureteral stent removal. No obstructing calculus Identified. This protocol does not include delayed imaging through the ureters, but no high-grade ureteral obstruction or focal urothelial lesion identified. Scattered small pulmonary nodules, nonspecific.   Micro Data:  Blood 11/22 > Urine 11/22 >  Antimicrobials:  Zosyn 11/22 > Vancomycin 11/22 >   Interim history/subjective:    Objective   Blood pressure 98/67, pulse (!) 140, temperature (!) 103.1 F (39.5 C), temperature source Oral, resp. rate (!) 24, SpO2 93 %.        Intake/Output Summary (Last 24 hours) at 04/21/2018 2047 Last data filed at 04/21/2018 1959 Gross per 24 hour  Intake 4500 ml  Output -  Net 4500 ml   There were no vitals filed for this visit.  Examination: General: Middle aged acutely ill appearing male. Generalized tremor/rigors.  HENT: [place]/AT, PERRL, no JVD Lungs: Clear bilateral breath sounds Cardiovascular: Tachy, regular, no MRG Abdomen: Soft, diffusely tender, non-distended. CVA tenderness L.  Extremities: No acute deformity or ROM limitation.  Neuro: Alert, oriented, non-focal Skin: widespread erythema GU: Foley  Resolved Hospital Problem list     Assessment & Plan:    Septic shock: presumed urinary source given recent history of urothelial carcinoma and stent placement, s/p stent removal 11/22. CT abdomen would further this presumption. Lactic acid initially 4.3 and climbing now to 4.9. - Admit to ICU - Urology consulting, awaiting recommendations - s/p 4L IVF in ED, will start norepinephrine for MAP goal 87mmHg and to facilitate pain medications.  - Trend lactic - Empiric antibiotics as above - Follow blood, urine cultures.  - Morphine PRN for abdominal/flank pain.   Atrial fibrillation: currently sinus - telemetry monitoring - holding home eliquis, diltiazem - Check coags  Fibromyalgia: with current excruciating leg pain - hold gabapentin while NPO  Hypothyroid - TSH recently 3.39 - convert synthroid to IV while NPO  Hypokalemia:  - Supp K  Best practice:  Diet: NPO Pain/Anxiety/Delirium protocol (if indicated): PRN morphine VAP protocol (if indicated):  DVT prophylaxis: heparin GI prophylaxis: Pepcid Glucose control:  Mobility: Bed rest Code Status: Full Family Communication:  Patient updated in ED Disposition: ICU  Labs   CBC: Recent Labs  Lab 04/16/18 1335 04/21/18 1424  WBC 6.3 9.7  NEUTROABS 4.0 8.7*  HGB 13.8 14.6  HCT 42.0 46.9  MCV 92.5 91.4  PLT 203 767    Basic Metabolic Panel: Recent Labs  Lab 04/16/18 1335 04/21/18 1424  NA 142 141  K 4.0 3.4*  CL 108 107  CO2 26 23  GLUCOSE 93 108*  BUN 16 12  CREATININE 0.79 1.05  CALCIUM 9.0 9.1   GFR: Estimated Creatinine Clearance: 87 mL/min (by C-G formula based on SCr of 1.05 mg/dL). Recent Labs  Lab 04/16/18 1335 04/21/18 1424 04/21/18 1706 04/21/18 1951  WBC 6.3 9.7  --   --   LATICACIDVEN  --   --  4.46* 4.93*    Liver Function Tests: Recent Labs  Lab 04/21/18 1424  AST 20  ALT 20  ALKPHOS 78  BILITOT 1.0  PROT 6.9  ALBUMIN 3.8   Recent Labs  Lab 04/21/18 1424  LIPASE 25   No results for input(s): AMMONIA in the last 168  hours.  ABG    Component Value Date/Time   HCO3 28.3 (H) 05/09/2007 0910   TCO2 23 08/17/2017 0225     Coagulation Profile: No results for input(s): INR, PROTIME in the last 168 hours.  Cardiac Enzymes: No results for input(s): CKTOTAL, CKMB, CKMBINDEX, TROPONINI in the last 168 hours.  HbA1C: No results found for: HGBA1C  CBG: No results for input(s): GLUCAP in the last 168 hours.  Review of Systems:   Bolds are positive  Constitutional: weight loss, gain, night sweats, Fevers, chills, fatigue .  HEENT: headaches, Sore throat, sneezing, nasal congestion, post nasal drip, Difficulty swallowing, Tooth/dental problems, visual complaints visual changes, ear ache CV:  chest pain, radiates:,Orthopnea, PND, swelling in lower extremities, dizziness, palpitations, syncope.  GI  heartburn, indigestion, abdominal pain, nausea, vomiting, diarrhea, change in bowel habits, loss of appetite, bloody stools.  Resp: cough, productive:, hemoptysis, dyspnea, chest pain, pleuritic.  Skin: rash or itching or icterus GU: dysuria, change in color of urine, urgency or frequency. flank pain, hematuria  MS: joint pain or swelling. decreased range of motion.  Psych: change in mood or affect. depression or anxiety.  Neuro: difficulty with speech, weakness, numbness, ataxia. Lower extremity nerve pain    Past Medical History  He,  has a past medical history of AF (paroxysmal atrial fibrillation) ([place]) (03/05/2018), Aneurysm of  abdominal aorta (HCC), Anxiety, Arthritis, Benign localized prostatic hyperplasia with lower urinary tract symptoms (LUTS), Bladder cancer ([place]), Coronary artery disease (primary cardiologist-- dr Dorris Carnes), Fibromyalgia, GERD (gastroesophageal reflux disease), History of kidney stones, History of skin cancer, Hypothyroidism, PONV (postoperative nausea and vomiting), S/P percutaneous transluminal coronary angioplasty, and Wears glasses.   Surgical History    Past Surgical History:   Procedure Laterality Date  . CARDIAC CATHETERIZATION  06-14-2001  dr Einar Gip   normal coronary arteries and patent previous dLAD   . CARDIOVASCULAR STRESS TEST  06/12/2008   Low risk nuclear study w/ no evidence ischemia/  normal LV funciton and wall motion , ef 60%  . COLONOSCOPY WITH PROPOFOL N/A 12/13/2013   Procedure: COLONOSCOPY WITH PROPOFOL;  Surgeon: Garlan Fair, MD;  Location: [facility];  Service: Endoscopy;  Laterality: N/A;  . CORONARY ANGIOPLASTY  05-18-1999  dr Tami Ribas   PTCA balloon to dLAD 80% (single vessel disease),  ef 60%  . CYSTOSCOPY WITH FULGERATION N/A 08/15/2017   Procedure: CYSTOSCOP/ BLADDER BIOPSY/TURBT;  Surgeon: Nickie Retort, MD;  Location[facility];  Service: Urology;  Laterality: N/A;  . CYSTOSCOPY WITH INSERTION OF UROLIFT N/A 06/23/2015   Procedure: CYSTOSCOPY WITH INSERTION OF UROLIFT;  Surgeon: Carolan Clines, MD;  Location: [facility];  Service: Urology;  Laterality: N/A;  . CYSTOSCOPY WITH URETEROSCOPY AND STENT PLACEMENT Left 04/07/2018   Procedure: CYSTOSCOPY WITH URETEROSCOPY AND STENT EXCHANGE/ BLADDER BIOPSY/LASER ABLATION OF URETERAL TUMOR;  Surgeon: Ceasar Mons, MD;  Location: [facility];  Service: Urology;  Laterality: Left;  ONLY NEEDS 60 MIN  . CYSTOSCOPY/URETEROSCOPY/HOLMIUM LASER/STENT PLACEMENT Bilateral 02/01/2017   Procedure: CYSTOSCOPY/URETEROSCOPY/HOLMIUM LASER/STENT PLACEMENT,STONE BASKETRY, BLADDER BIOPSY;  Surgeon: Nickie Retort, MD;  Location: [facility];  Service: Urology;  Laterality: Bilateral;  . ESOPHAGOGASTRODUODENOSCOPY  08-24-2006  . TRANSTHORACIC ECHOCARDIOGRAM  03/06/2018   mild LVH,  ef 55-60%,  pt in atrial fib. LV diastolic not evaluated/  mild LAE and RAE/  moderate AV calcification with sclerosis , no stenosis or regurg./  mild dilated aortic root, 33mm/  trival MR and TR  . TRANSURETHRAL INCISION OF PROSTATE N/A 04/01/2014   Procedure:  TRANSURETHRAL INCISION OF THE PROSTATE (TUIP);  Surgeon: Ailene Rud, MD;  Location: [facility];  Service: Urology;  Laterality: N/A;     Social History   reports that he quit smoking about 14 years ago. His smoking use included cigarettes. He has a 25.00 pack-year smoking history. He has never used smokeless tobacco. He reports that he does not drink alcohol or use drugs.   Family History   His family history includes Anuerysm in his mother.   Allergies Allergies  Allergen Reactions  . Codeine Nausea And Vomiting    "made my head feel funny too"     Home Medications  Prior to Admission medications   Medication Sig Start Date End Date Taking? Authorizing Provider  ALPRAZolam Duanne Moron) 1 MG tablet Take 1 mg by mouth 2 (two) times daily.    Yes [provider]  apixaban (ELIQUIS) 5 MG TABS tablet Take 1 tablet (5 mg total) by mouth 2 (two) times daily. 04/10/18  Yes Fay Records, MD  cephALEXin (KEFLEX) 500 MG capsule Take 1 capsule (500 mg total) by mouth 3 (three) times daily for 7 days. 04/16/18 04/23/18 Yes Duffy Bruce, MD  esomeprazole (NEXIUM) 40 MG capsule Take 40 mg by mouth every morning.    Yes [provider]  gabapentin (NEURONTIN) 800 MG tablet Take 800 mg by mouth 3 (three) times daily.  12/30/17  Yes [provider]  levothyroxine (SYNTHROID, LEVOTHROID) 50 MCG tablet Take 1 tablet (50 mcg total) by mouth daily before breakfast. Patient taking differently: Take 50 mcg by mouth daily before breakfast.  03/10/18  Yes Sheikh, Omair Latif, DO  pravastatin (PRAVACHOL) 40 MG tablet Take 40 mg by mouth every morning.  05/07/15  Yes [provider]  tamsulosin (FLOMAX) 0.4 MG CAPS capsule Take 0.4 mg by mouth every morning. 04/02/18  Yes [provider]  valACYclovir (VALTREX) 1000 MG tablet Take 1,000 mg by mouth 2 (two) times daily as needed. X 7 DAYS, THEN 500 MG BID X 6 MONTHS   Yes [provider]    vitamin B-12 (CYANOCOBALAMIN) 1000 MCG tablet Take 1,000 mcg by mouth daily.   Yes [provider]  diltiazem (CARDIZEM CD) 180 MG 24 hr capsule Take 1 capsule (180 mg total) by mouth daily. Patient not taking: Reported on 04/21/2018 04/17/18 07/16/18  Fay Records, MD  HYDROcodone-acetaminophen [facility]) 5-325 MG tablet Take 1 tablet by mouth every 4 (four) hours as needed for moderate pain. Patient not taking: Reported on 04/21/2018 04/07/18   Ceasar Mons, MD  lip balm [facility]) ointment Apply topically as needed for lip care. Patient not taking: Reported on 04/21/2018 03/09/18   Raiford Noble Latif, DO  ondansetron (ZOFRAN) 4 MG tablet Take 1 tablet (4 mg total) by mouth daily as needed for nausea or vomiting. Patient not taking: Reported on 04/21/2018 04/07/18 04/07/19  Ceasar Mons, MD  phenazopyridine (PYRIDIUM) 200 MG tablet Take 1 tablet (200 mg total) by mouth 3 (three) times daily as needed (for pain with urination). Patient not taking: Reported on 04/21/2018 04/07/18 04/07/19  Ceasar Mons, MD     Critical care time:35 mins     Georgann Housekeeper, AGACNP-BC [place] Pager [phone] or [phone]  04/21/2018 9:13 PM

## 2018-04-21 NOTE — ED Notes (Signed)
To ct

## 2018-04-22 ENCOUNTER — Inpatient Hospital Stay (HOSPITAL_COMMUNITY): Payer: Medicare Other

## 2018-04-22 DIAGNOSIS — R6521 Severe sepsis with septic shock: Secondary | ICD-10-CM

## 2018-04-22 LAB — BASIC METABOLIC PANEL
Anion gap: 6 (ref 5–15)
BUN: 13 mg/dL (ref 8–23)
CALCIUM: 7.6 mg/dL — AB (ref 8.9–10.3)
CO2: 22 mmol/L (ref 22–32)
CREATININE: 1.22 mg/dL (ref 0.61–1.24)
Chloride: 108 mmol/L (ref 98–111)
GFR calc Af Amer: >60 mL/min (ref >60)
GFR calc non Af Amer: 59 mL/min — ABNORMAL LOW (ref >60)
GLUCOSE: 128 mg/dL — AB (ref 70–99)
Potassium: 3.8 mmol/L (ref 3.5–5.1)
Sodium: 136 mmol/L (ref 135–145)

## 2018-04-22 LAB — CBC
HEMATOCRIT: 36.4 % — AB (ref 39.0–52.0)
Hemoglobin: 11.5 g/dL — ABNORMAL LOW (ref 13.0–17.0)
MCH: 28.9 pg (ref 26.0–34.0)
MCHC: 31.6 g/dL (ref 30.0–36.0)
MCV: 91.5 fL (ref 80.0–100.0)
Platelets: 217 10*3/uL (ref 150–400)
RBC: 3.98 MIL/uL — ABNORMAL LOW (ref 4.22–5.81)
RDW: 13.8 % (ref 11.5–15.5)
WBC: 17.6 10*3/uL — ABNORMAL HIGH (ref 4.0–10.5)
nRBC: 0 % (ref 0.0–0.2)

## 2018-04-22 LAB — MAGNESIUM: Magnesium: 1.3 mg/dL — ABNORMAL LOW (ref 1.7–2.4)

## 2018-04-22 LAB — HIV ANTIBODY (ROUTINE TESTING W REFLEX): HIV Screen 4th Generation wRfx: NONREACTIVE

## 2018-04-22 LAB — PHOSPHORUS: Phosphorus: 2.9 mg/dL (ref 2.5–4.6)

## 2018-04-22 LAB — TROPONIN I: Troponin I: 0.66 ng/mL (ref <0.03)

## 2018-04-22 LAB — MRSA PCR SCREENING: MRSA BY PCR: NEGATIVE

## 2018-04-22 LAB — LACTIC ACID, PLASMA: Lactic Acid, Venous: 2.3 mmol/L (ref 0.5–1.9)

## 2018-04-22 MED ORDER — MAGNESIUM SULFATE 4 GM/100ML IV SOLN
4.0000 g | Freq: Once | INTRAVENOUS | Status: AC
  Administered 2018-04-22: 4 g via INTRAVENOUS
  Filled 2018-04-22: qty 100

## 2018-04-22 MED ORDER — PHENYLEPHRINE 40 MCG/ML (10ML) SYRINGE FOR IV PUSH (FOR BLOOD PRESSURE SUPPORT)
PREFILLED_SYRINGE | INTRAVENOUS | Status: AC
  Filled 2018-04-22: qty 10

## 2018-04-22 MED ORDER — ONDANSETRON HCL 4 MG/2ML IJ SOLN
INTRAMUSCULAR | Status: AC
  Filled 2018-04-22: qty 2

## 2018-04-22 MED ORDER — PANTOPRAZOLE SODIUM 40 MG PO TBEC
40.0000 mg | DELAYED_RELEASE_TABLET | Freq: Every day | ORAL | Status: DC
  Administered 2018-04-22 – 2018-04-25 (×4): 40 mg via ORAL
  Filled 2018-04-22 (×4): qty 1

## 2018-04-22 MED ORDER — DILTIAZEM HCL-DEXTROSE 100-5 MG/100ML-% IV SOLN (PREMIX)
5.0000 mg/h | INTRAVENOUS | Status: DC
  Filled 2018-04-22: qty 100

## 2018-04-22 MED ORDER — LEVOTHYROXINE SODIUM 50 MCG PO TABS
50.0000 ug | ORAL_TABLET | Freq: Every day | ORAL | Status: DC
  Administered 2018-04-22 – 2018-04-25 (×4): 50 ug via ORAL
  Filled 2018-04-22 (×4): qty 1

## 2018-04-22 MED ORDER — ACETAMINOPHEN 325 MG PO TABS
650.0000 mg | ORAL_TABLET | ORAL | Status: DC | PRN
  Administered 2018-04-22: 650 mg via ORAL
  Filled 2018-04-22: qty 2

## 2018-04-22 MED ORDER — ONDANSETRON HCL 4 MG/2ML IJ SOLN
INTRAMUSCULAR | Status: DC | PRN
  Administered 2018-04-22: 4 mg via INTRAVENOUS

## 2018-04-22 MED ORDER — LACTATED RINGERS IV BOLUS
1000.0000 mL | Freq: Once | INTRAVENOUS | Status: AC
  Administered 2018-04-22: 1000 mL via INTRAVENOUS

## 2018-04-22 MED ORDER — GABAPENTIN 400 MG PO CAPS
800.0000 mg | ORAL_CAPSULE | Freq: Three times a day (TID) | ORAL | Status: DC
  Administered 2018-04-22 – 2018-04-25 (×10): 800 mg via ORAL
  Filled 2018-04-22 (×10): qty 2

## 2018-04-22 MED ORDER — LIDOCAINE 2% (20 MG/ML) 5 ML SYRINGE
INTRAMUSCULAR | Status: AC
  Filled 2018-04-22: qty 5

## 2018-04-22 MED ORDER — LIDOCAINE HCL (CARDIAC) PF 100 MG/5ML IV SOSY
PREFILLED_SYRINGE | INTRAVENOUS | Status: DC | PRN
  Administered 2018-04-21: 100 mg via INTRATRACHEAL

## 2018-04-22 MED ORDER — ONDANSETRON HCL 4 MG/2ML IJ SOLN
4.0000 mg | Freq: Three times a day (TID) | INTRAMUSCULAR | Status: DC | PRN
  Administered 2018-04-22: 4 mg via INTRAVENOUS
  Filled 2018-04-22: qty 2

## 2018-04-22 MED ORDER — SUCCINYLCHOLINE CHLORIDE 200 MG/10ML IV SOSY
PREFILLED_SYRINGE | INTRAVENOUS | Status: AC
  Filled 2018-04-22: qty 10

## 2018-04-22 MED ORDER — 0.9 % SODIUM CHLORIDE (POUR BTL) OPTIME
TOPICAL | Status: DC | PRN
  Administered 2018-04-21: 1000 mL

## 2018-04-22 MED ORDER — SODIUM CHLORIDE (PF) 0.9 % IJ SOLN
INTRAMUSCULAR | Status: AC
  Filled 2018-04-22: qty 10

## 2018-04-22 MED ORDER — ALPRAZOLAM 0.5 MG PO TABS
1.0000 mg | ORAL_TABLET | Freq: Two times a day (BID) | ORAL | Status: DC | PRN
  Administered 2018-04-22 – 2018-04-23 (×2): 1 mg via ORAL
  Filled 2018-04-22 (×2): qty 2

## 2018-04-22 NOTE — Anesthesia Procedure Notes (Signed)
Central Venous Catheter Insertion Performed by: Oleta Mouse, MD, anesthesiologist Start/End11/23/2019 12:00 AM, 04/22/2018 12:09 AM Patient location: OR. Preanesthetic checklist: patient identified, IV checked, site marked, risks and benefits discussed, surgical consent, monitors and equipment checked, pre-op evaluation, timeout performed and anesthesia consent Position: supine Lidocaine 1% used for infiltration Hand hygiene performed  and maximum sterile barriers used  Catheter size: 8 Fr Total catheter length 16. Central line was placed.Double lumen Procedure performed using ultrasound guided technique. Ultrasound Notes:anatomy identified, needle tip was noted to be adjacent to the nerve/plexus identified, no ultrasound evidence of intravascular and/or intraneural injection and image(s) printed for medical record Attempts: 1 Following insertion, dressing applied, line sutured and Biopatch. Post procedure assessment: blood return through all ports, free fluid flow and no air  Patient tolerated the procedure well with no immediate complications.

## 2018-04-22 NOTE — Progress Notes (Signed)
Urology Progress Note   1 Day Post-Op  Subjective/ Interval events: Pain improved compared to yesterday. Increasing NE overnight, currently at 37.5 with improved pressures.   Objective: Vital signs in last 24 hours: Temp:  [98.1 F (36.7 C)-103.2 F (39.6 C)] 98.8 F (37.1 C) (11/23 0400) Pulse Rate:  [73-159] 85 (11/23 0700) Resp:  [14-28] 16 (11/23 0700) BP: (71-141)/(52-130) 102/64 (11/23 0700) SpO2:  [84 %-100 %] 96 % (11/23 0700) Arterial Line BP: (88-126)/(36-54) 113/53 (11/23 0700) Weight:  [102.1 kg] 102.1 kg (11/23 0100)  Intake/Output from previous day: 11/22 0701 - 11/23 0700 In: 5958.1 [I.V.:5707.9; IV Piggyback:250.2] Out: 650 [Urine:650] Intake/Output this shift: No intake/output data recorded.  Physical Exam:  General: Alert and oriented CV: Regular rate Lungs: NWOB Abdomen: Soft, NT, ND GU: Foley in place draining clear yellow urine, no CVA tenderness Ext: NT, No erythema  Lab Results: Recent Labs    04/21/18 1424 04/22/18 0345  HGB 14.6 11.5*  HCT 46.9 36.4*   BMET Recent Labs    04/21/18 1424 04/22/18 0345  NA 141 136  K 3.4* 3.8  CL 107 108  CO2 23 22  GLUCOSE 108* 128*  BUN 12 13  CREATININE 1.05 1.22  CALCIUM 9.1 7.6*     Studies/Results: Dg Chest Port 1 View  Result Date: 04/22/2018 CLINICAL DATA:  Central line plate basement. EXAM: PORTABLE CHEST 1 VIEW COMPARISON:  Chest x-ray dated 04/21/2018. FINDINGS: Heart size and mediastinal contours are stable. Lungs are clear. No pleural effusion or pneumothorax seen. RIGHT IJ central line in place with tip adequately positioned at the level of the mid SVC. IMPRESSION: 1. RIGHT IJ central line now in place with tip adequately positioned at the level of the mid SVC. 2. No pneumothorax. Electronically Signed   By: Franki Cabot M.D.   On: 04/22/2018 01:11   Dg Chest Portable 1 View  Result Date: 04/21/2018 CLINICAL DATA:  Fever with low blood pressure and tachycardia. History of bladder  cancer and atrial fibrillation. EXAM: PORTABLE CHEST 1 VIEW COMPARISON:  Radiographs 03/05/2018 and 11/30/2017. FINDINGS: 1555 hours. Two views were obtained. The heart size and mediastinal contours are stable. There is mild biapical scarring. The lungs are otherwise clear. There is no pleural effusion or pneumothorax. Slight tracheal deviation to the right appears stable. No acute osseous findings are seen. Multiple telemetry leads overlie the chest. IMPRESSION: No active cardiopulmonary process. Electronically Signed   By: Richardean Sale M.D.   On: 04/21/2018 16:14   Ct Angio Chest/abd/pel For Dissection W And/or Wo Contrast  Result Date: 04/21/2018 CLINICAL DATA:  Generalized abdominal pain with vomiting. Recent ureteral stent removal. Aortic dissection suspected. EXAM: CT ANGIOGRAPHY CHEST, ABDOMEN AND PELVIS TECHNIQUE: Multidetector CT imaging through the chest, abdomen and pelvis was performed using the standard protocol during bolus administration of intravenous contrast. Multiplanar reconstructed images and MIPs were obtained and reviewed to evaluate the vascular anatomy. CONTRAST:  193mL ISOVUE-370 IOPAMIDOL (ISOVUE-370) INJECTION 76% COMPARISON:  Chest radiographs 04/21/2018. Abdominopelvic CT 02/08/2018. FINDINGS: CTA CHEST FINDINGS Cardiovascular: Pre contrast images demonstrate minimal atherosclerosis of the aorta and great vessels. There are calcifications of the aortic valve. Following contrast, the aorta opacifies normally. There is no evidence of aortic aneurysm or dissection. The pulmonary arteries are well opacified with contrast. There is no evidence of acute pulmonary embolism. The heart size is normal. There is no pericardial effusion. Mediastinum/Nodes: There are small right hilar lymph nodes. There are no enlarged mediastinal or axillary lymph nodes. The thyroid  gland, trachea and esophagus demonstrate no significant findings. Lungs/Pleura: There is no pleural effusion or pneumothorax.  There is mild emphysema with mild biapical and bibasilar scarring. There are scattered small pulmonary nodules, including a subpleural nodule in the right lower lobe measuring 5 mm on image 80/7 which is unchanged from previous abdominal CT. In the lingula, there is a 4 mm nodule on image 52/7. Musculoskeletal/Chest wall: There is no chest wall mass or suspicious osseous finding. Review of the MIP images confirms the above findings. CTA ABDOMEN AND PELVIS FINDINGS VASCULAR Aorta: Minimal atherosclerosis.  No aneurysm or dissection. Celiac: Mild calcification at the ostium.  No occlusion or aneurysm. SMA: Normal. Renals: Normal. IMA: Normal Inflow: Minimal iliac atherosclerosis without aneurysm or stenosis. Veins: Limited opacification.  No abnormalities identified. Review of the MIP images confirms the above findings. NON-VASCULAR Hepatobiliary: The pre contrast images demonstrate decreased hepatic density consistent with steatosis. Following contrast. No focal lesion or abnormal enhancement demonstrated. No evidence of gallstones, gallbladder wall thickening or biliary dilatation. Pancreas: Unremarkable. No pancreatic ductal dilatation or surrounding inflammatory changes. Spleen: Normal in size without focal abnormality. Adrenals/Urinary Tract: Both adrenal glands appear normal. The right kidney appears stable with a nonobstructing 9 mm calculus in its lower pole on image 150/6. There is an adjacent simple cyst. There is no right-sided hydronephrosis or ureteral calculus. No focal left renal abnormality is identified. There is new perinephric and periureteral soft tissue stranding on the left associated with persistent mild dilatation of the left ureter. Left ureteral stent seen on recent radiographs has been removed. There is persistent ureteral wall thickening and hyperenhancement. This protocol does not include delayed post-contrast images through the ureters. Foley catheter is in place. The bladder is  incompletely distended with mild wall thickening. No definite stone fragments in either ureter or the bladder. Stomach/Bowel: No evidence of bowel wall thickening, distention or surrounding inflammatory change. The appendix appears normal. Lymphatic: There are stable small lymph nodes within the porta hepatis. Reproductive: Fiducial markers again noted in the prostate gland. No abnormality of the seminal vesicle seen. Other: Stable mild fat in both inguinal canals. Musculoskeletal: No acute or significant osseous findings. Mild lumbar spondylosis. Old rib fractures on the left. Review of the MIP images confirms the above findings. IMPRESSION: 1. No evidence of aortic dissection, aneurysm or large vessel occlusion. 2. Persistent asymmetric left ureteral dilatation and wall thickening following ureteral stent removal. No obstructing calculus identified. This protocol does not include delayed imaging through the ureters, but no high-grade ureteral obstruction or focal urothelial lesion identified. Urology follow-up recommended. 3. Mild Aortic Atherosclerosis (ICD10-I70.0). Calcifications of the aortic valve. 4.  Emphysema (ICD10-J43.9). 5. Scattered small pulmonary nodules, nonspecific. Those at the lung bases appear chronic and unchanged. No routine follow-up is necessary in a low risk patient per consensus guidelines. Electronically Signed   By: Richardean Sale M.D.   On: 04/21/2018 18:53    Assessment/Plan:  67 y.o. male s/p left ureteral stent placement for ureteral obstruction in setting of likely urosepsis. He remains critically ill in the ICU with increased pressor requirements. Lactate improving, creatinine 1.22.  - Continue empiric broad spectrum antibiotics. Follow-up urine and blood cultures and adjust antibiotics appropriately. Urology will also follow-up urine culture from clinic on 11/22.  - Continue foley catheter until patient is stable, off pressors, afebrile x 24 hours, and on culture-specific  antibiotics.  - Urology will arrange outpatient follow-up to discuss of plan for left ureteral stent. Plan for stent to remain in  place at time of discharge.    Thank you for this consult.    LOS: 1 day   Dorothey Baseman 04/22/2018, 7:50 AM

## 2018-04-22 NOTE — ED Provider Notes (Signed)
  Physical Exam  BP 102/64   Pulse 85   Temp 99.1 F (37.3 C) (Oral)   Resp 16   Wt 102.1 kg   SpO2 96%   BMI 26.01 kg/m   Physical Exam  Constitutional: He appears well-developed and well-nourished.  HENT:  Head: Normocephalic and atraumatic.  Eyes: Conjunctivae are normal.  Neck: Neck supple.  Cardiovascular: An irregularly irregular rhythm present. Tachycardia present.  No murmur heard. Pulmonary/Chest: Effort normal and breath sounds normal. No respiratory distress.  Abdominal: Soft. There is no tenderness.  Musculoskeletal: He exhibits no edema.  Neurological: He is alert.  Skin: Skin is warm and dry.  Psychiatric: He has a normal mood and affect.  Nursing note and vitals reviewed.   ED Course/Procedures     Procedures  MDM  Care was assumed from [facility] at 4:30 pm.  CT A/P was obtained that showed no new acute abnormalities and persistent dilatation of L ureter after stent removal. Patient continued to be hypotensive despite 4L LR and vanc/zosyn. Critical care was contacted and requested repeat lactic acid which was trending upwards to 4.9 and additional 1L LR.  Patient continued to be tachycardiac, BP was low with MAP 70-75.  Patient will be admitted to the ICU for further treatment.        Erskine Squibb, MD 04/22/18 [place], [place], DO 04/22/18 [phone]

## 2018-04-22 NOTE — Anesthesia Procedure Notes (Signed)
Arterial Line Insertion Start/End11/23/2019 12:01 AM, 04/22/2018 12:05 AM Performed by: Oleta Mouse, MD  Patient location: OR. Patient sedated radial was placed Catheter size: 20 G Hand hygiene performed  and maximum sterile barriers used   Attempts: 2 Procedure performed using ultrasound guided technique. Ultrasound Notes:anatomy identified, needle tip was noted to be adjacent to the nerve/plexus identified and no ultrasound evidence of intravascular and/or intraneural injection Following insertion, dressing applied and Biopatch.

## 2018-04-22 NOTE — Anesthesia Preprocedure Evaluation (Signed)
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 Normal LV size with mild LV hypertrophy. EF 55-60%. Normal RV size and systolic function. No significant valvular abnormalities.  Stress Test 2010  abnormal EKG positive for ischemia, but nuclear imaging showed diaphragmatic attenuation without evidence for ischemia or infarct, EF 60%.   [place] 2003 unremarkable    Neuro/Psych PSYCHIATRIC DISORDERS Anxiety negative neurological ROS     GI/Hepatic Neg liver ROS, GERD  Medicated,  Endo/Other  Hypothyroidism   Renal/GU Renal diseaseLeft hydronephrosis and sepsis after stent removal    BPH    Musculoskeletal  (+) Arthritis , Fibromyalgia -  Abdominal   Peds  Hematology negative hematology ROS (+)   Anesthesia Other Findings Bladder cancer Levophed gtt from ED  Reproductive/Obstetrics                             Anesthesia Physical Anesthesia Plan  ASA: II and emergent  Anesthesia Plan: General   Post-op Pain Management:    Induction: Intravenous and Rapid sequence  PONV Risk Score and Plan: 3 and Ondansetron and Dexamethasone  Airway Management Planned: Oral ETT  Additional Equipment: Arterial line, Ultrasound  Guidance Line Placement and CVP  Intra-op Plan:   Post-operative Plan: Possible Post-op intubation/ventilation and Extubation in OR  Informed Consent: I have reviewed the patients History and Physical, chart, labs and discussed the procedure including the risks, benefits and alternatives for the proposed anesthesia with the patient or authorized representative who has indicated his/her understanding and acceptance.   Dental advisory given  Plan Discussed with: CRNA and Surgeon  Anesthesia Plan Comments:         Anesthesia Quick Evaluation

## 2018-04-22 NOTE — Progress Notes (Signed)
CRITICAL VALUE ALERT  Critical Value:  Lactic acid 2.3   Date & Time Notied:  04/22/18 0450  Provider Notified: Warren Lacy   Orders Received/Actions taken:

## 2018-04-22 NOTE — Progress Notes (Signed)
Patient in A. Fib w/ HR 140-150 bpm. ELink notified. Cardizem gtt ordered. However, patient converted to NSR with HR 80-90 prior to administering medication. Will hold med and start if patient converts back into A. Fib.

## 2018-04-22 NOTE — Progress Notes (Signed)
NAME:  Marvin Gill, MRN:  [id number], DOB:  09-25-1950, LOS: 1 ADMISSION DATE:  04/21/2018, CONSULTATION DATE:  11/22 REFERRING MD:  Dr. Tyrone Nine EDP, CHIEF COMPLAINT:  Sepsis   Brief History    67 year old male with PMH as below, which is significant for atrial fibrillation on eliquis, AAA, urothelial carcinoma, Fibromyalgia, and CAD. He was recently diagnosed with both urothelial carcinoma and atrial fibrillaion. AF treated with diltiazem and eliquis. He has been followed by urology and underwent cystoscopy with left ureteroscopy, holmium laser ablation of the left distal ureteral tumor, bladder biopsy, and left JJ stent placement on 04/07/18. Marland Kitchen He had an admission since that time for urologic infection from 10/6-10/10. He was treated with antibiotics and discharged on keppra. There was some concern he may have been bacteremic, but this was never proven by culture.   He had stent removed 11/22 as an outpatient procedure. Shortly after removal he returned home and attempted to mow the yard, but developed severe abdominal pain. Also developed leg pain and chills. In the ED he was found to be tachycardic, which was thought to represent AF and was started on diltiazem infusion. He then became bradycaric and developed fever, hypotension. Lactic elevated. He was then felt to be septic and given 4L IVF, but remained tachycardic. Repeat lactic elevated further. CT of the abdomen demonstrated left ureteral dialtion and wall thickening. No obvious obstruction, but obstruction not ruled out. He was treated with broad spectrum antibiotics and PCCM was asked to admit.    has a past medical history of AF (paroxysmal atrial fibrillation) ([place]) (03/05/2018), Aneurysm of abdominal aorta ([facility]), Anxiety, Arthritis, Benign localized prostatic hyperplasia with lower urinary tract symptoms (LUTS), Bladder cancer ([place]), Coronary artery disease (primary cardiologist-- dr Dorris Carnes), Fibromyalgia, GERD (gastroesophageal  reflux disease), History of kidney stones, History of skin cancer, Hypothyroidism, PONV (postoperative nausea and vomiting), S/P percutaneous transluminal coronary angioplasty, and Wears glasses.  Significant Hospital Events   11/22 admit 1`06/21/17 - 11/22 - Postoperative diagnosis: Left hydronephrosis and sepsis Surgery: Cystoscopy and insertion of left ureteral stent Surgeon: Dr. Kenton Kingfisher  Consults:  Urology 11/22 >  Procedures:  R IJ 04/21/18  Urology 11/22 - Cystoscopy and insertion of left ureteral stent Surgeon: Dr. Bjorn Loser   Significant Diagnostic Tests:  CT angio abdomen/pelvis/chest 11/22 > Persistent asymmetric left ureteral dilatation and wall thickening following ureteral stent removal. No obstructing calculus Identified. This protocol does not include delayed imaging through the ureters, but no high-grade ureteral obstruction or focal urothelial lesion identified. Scattered small pulmonary nodules, nonspecific.   Micro Data:  Blood 11/22 > Urine 11/22 >  Antimicrobials:  Zosyn 11/22 > Vancomycin 11/22 >   Interim history/subjective:   04/22/18 - fever dropped from 103F to 79F . Levophed 93mcg ongoing. Says he feels better but still hurting all over, Had A Fib RVR and converted to NSR post op yesterday   Objective   Blood pressure 102/64, pulse 85, temperature 98.8 F (37.1 C), temperature source Oral, resp. rate 16, weight 102.1 kg, SpO2 96 %.        Intake/Output Summary (Last 24 hours) at 04/22/2018 0936 Last data filed at 04/22/2018 0700 Gross per 24 hour  Intake 5958.13 ml  Output 650 ml  Net 5308.13 ml   Filed Weights   04/22/18 0100  Weight: 102.1 kg   General Appearance:  Looks a bit toxic Head:  Normocephalic, without obvious abnormality, atraumatic Eyes:  PERRL - yes, conjunctiva/corneas - clear  Ears:  Normal external ear canals, both ears Nose:  G tube - no Throat:  ETT TUBE - no , OG tube - no Neck:  Supple,  No  enlargement/tenderness/nodules Lungs: Clear to auscultation bilaterally,  Heart:  S1 and S2 normal, no murmur, CVP - no.  Pressors - no Abdomen:  Soft, no masses, no organomegaly Genitalia / Rectal:  Not done Extremities:  Extremities- intact Skin:  ntact in exposed areas . Neurologic:  Sedation - none -> RASS - +1 . Moves all 4s - yes. CAM-ICU - neg . Orientation - x3+    LABS    PULMONARY No results for input(s): PHART, PCO2ART, PO2ART, HCO3, TCO2, O2SAT in the last 168 hours.  Invalid input(s): PCO2, PO2  CBC Recent Labs  Lab 04/16/18 1335 04/21/18 1424 04/22/18 0345  HGB 13.8 14.6 11.5*  HCT 42.0 46.9 36.4*  WBC 6.3 9.7 17.6*  PLT 203 280 217    COAGULATION Recent Labs  Lab 04/21/18 2236  INR 1.35    CARDIAC  No results for input(s): TROPONINI in the last 168 hours. No results for input(s): PROBNP in the last 168 hours.   CHEMISTRY Recent Labs  Lab 04/16/18 1335 04/21/18 1424 04/22/18 0345  NA 142 141 136  K 4.0 3.4* 3.8  CL 108 107 108  CO2 26 23 22   GLUCOSE 93 108* 128*  BUN 16 12 13   CREATININE 0.79 1.05 1.22  CALCIUM 9.0 9.1 7.6*  MG  --   --  1.3*  PHOS  --   --  2.9   Estimated Creatinine Clearance: 74.9 mL/min (by C-G formula based on SCr of 1.22 mg/dL).   LIVER Recent Labs  Lab 04/21/18 1424 04/21/18 2236  AST 20  --   ALT 20  --   ALKPHOS 78  --   BILITOT 1.0  --   PROT 6.9  --   ALBUMIN 3.8  --   INR  --  1.35     INFECTIOUS Recent Labs  Lab 04/21/18 2236 04/21/18 2244 04/22/18 0345  LATICACIDVEN 3.2* 3.13* 2.3*  PROCALCITON 16.06  --   --      ENDOCRINE CBG (last 3)  No results for input(s): GLUCAP in the last 72 hours.       IMAGING x48h  - image(s) personally visualized  -   highlighted in bold Dg Cystogram  Result Date: 04/22/2018 CLINICAL DATA:  Recurrent left hydroureteronephrosis with distal ureteral obstruction EXAM: CYSTOGRAM - 3+ VIEW CONTRAST:  See operative note FLUOROSCOPY TIME:   Fluoroscopy Time:  28 seconds Radiation Exposure Index (if provided by the fluoroscopic device): Not available Number of Acquired Spot Images: 4 COMPARISON:  None. FINDINGS: Spot fluoroscopic views during the operative procedure demonstrate left retrograde pyelogram. Mild left hydroureteronephrosis. Retrograde wire access performed into the left upper pole calyx for placement of a left ureteral stent successfully. IMPRESSION: Successful insertion of a left ureteral stent. Electronically Signed   By: Jerilynn Mages.  Shick M.D.   On: 04/22/2018 08:20   Dg Chest Port 1 View  Result Date: 04/22/2018 CLINICAL DATA:  Central line plate basement. EXAM: PORTABLE CHEST 1 VIEW COMPARISON:  Chest x-ray dated 04/21/2018. FINDINGS: Heart size and mediastinal contours are stable. Lungs are clear. No pleural effusion or pneumothorax seen. RIGHT IJ central line in place with tip adequately positioned at the level of the mid SVC. IMPRESSION: 1. RIGHT IJ central line now in place with tip adequately positioned at the level of the mid SVC. 2. No  pneumothorax. Electronically Signed   By: Franki Cabot M.D.   On: 04/22/2018 01:11   Dg Chest Portable 1 View  Result Date: 04/21/2018 CLINICAL DATA:  Fever with low blood pressure and tachycardia. History of bladder cancer and atrial fibrillation. EXAM: PORTABLE CHEST 1 VIEW COMPARISON:  Radiographs 03/05/2018 and 11/30/2017. FINDINGS: 1555 hours. Two views were obtained. The heart size and mediastinal contours are stable. There is mild biapical scarring. The lungs are otherwise clear. There is no pleural effusion or pneumothorax. Slight tracheal deviation to the right appears stable. No acute osseous findings are seen. Multiple telemetry leads overlie the chest. IMPRESSION: No active cardiopulmonary process. Electronically Signed   By: Richardean Sale M.D.   On: 04/21/2018 16:14   Ct Angio Chest/abd/pel For Dissection W And/or Wo Contrast  Result Date: 04/21/2018 CLINICAL DATA:   Generalized abdominal pain with vomiting. Recent ureteral stent removal. Aortic dissection suspected. EXAM: CT ANGIOGRAPHY CHEST, ABDOMEN AND PELVIS TECHNIQUE: Multidetector CT imaging through the chest, abdomen and pelvis was performed using the standard protocol during bolus administration of intravenous contrast. Multiplanar reconstructed images and MIPs were obtained and reviewed to evaluate the vascular anatomy. CONTRAST:  126mL ISOVUE-370 IOPAMIDOL (ISOVUE-370) INJECTION 76% COMPARISON:  Chest radiographs 04/21/2018. Abdominopelvic CT 02/08/2018. FINDINGS: CTA CHEST FINDINGS Cardiovascular: Pre contrast images demonstrate minimal atherosclerosis of the aorta and great vessels. There are calcifications of the aortic valve. Following contrast, the aorta opacifies normally. There is no evidence of aortic aneurysm or dissection. The pulmonary arteries are well opacified with contrast. There is no evidence of acute pulmonary embolism. The heart size is normal. There is no pericardial effusion. Mediastinum/Nodes: There are small right hilar lymph nodes. There are no enlarged mediastinal or axillary lymph nodes. The thyroid gland, trachea and esophagus demonstrate no significant findings. Lungs/Pleura: There is no pleural effusion or pneumothorax. There is mild emphysema with mild biapical and bibasilar scarring. There are scattered small pulmonary nodules, including a subpleural nodule in the right lower lobe measuring 5 mm on image 80/7 which is unchanged from previous abdominal CT. In the lingula, there is a 4 mm nodule on image 52/7. Musculoskeletal/Chest wall: There is no chest wall mass or suspicious osseous finding. Review of the MIP images confirms the above findings. CTA ABDOMEN AND PELVIS FINDINGS VASCULAR Aorta: Minimal atherosclerosis.  No aneurysm or dissection. Celiac: Mild calcification at the ostium.  No occlusion or aneurysm. SMA: Normal. Renals: Normal. IMA: Normal Inflow: Minimal iliac  atherosclerosis without aneurysm or stenosis. Veins: Limited opacification.  No abnormalities identified. Review of the MIP images confirms the above findings. NON-VASCULAR Hepatobiliary: The pre contrast images demonstrate decreased hepatic density consistent with steatosis. Following contrast. No focal lesion or abnormal enhancement demonstrated. No evidence of gallstones, gallbladder wall thickening or biliary dilatation. Pancreas: Unremarkable. No pancreatic ductal dilatation or surrounding inflammatory changes. Spleen: Normal in size without focal abnormality. Adrenals/Urinary Tract: Both adrenal glands appear normal. The right kidney appears stable with a nonobstructing 9 mm calculus in its lower pole on image 150/6. There is an adjacent simple cyst. There is no right-sided hydronephrosis or ureteral calculus. No focal left renal abnormality is identified. There is new perinephric and periureteral soft tissue stranding on the left associated with persistent mild dilatation of the left ureter. Left ureteral stent seen on recent radiographs has been removed. There is persistent ureteral wall thickening and hyperenhancement. This protocol does not include delayed post-contrast images through the ureters. Foley catheter is in place. The bladder is incompletely distended with mild wall  thickening. No definite stone fragments in either ureter or the bladder. Stomach/Bowel: No evidence of bowel wall thickening, distention or surrounding inflammatory change. The appendix appears normal. Lymphatic: There are stable small lymph nodes within the porta hepatis. Reproductive: Fiducial markers again noted in the prostate gland. No abnormality of the seminal vesicle seen. Other: Stable mild fat in both inguinal canals. Musculoskeletal: No acute or significant osseous findings. Mild lumbar spondylosis. Old rib fractures on the left. Review of the MIP images confirms the above findings. IMPRESSION: 1. No evidence of aortic  dissection, aneurysm or large vessel occlusion. 2. Persistent asymmetric left ureteral dilatation and wall thickening following ureteral stent removal. No obstructing calculus identified. This protocol does not include delayed imaging through the ureters, but no high-grade ureteral obstruction or focal urothelial lesion identified. Urology follow-up recommended. 3. Mild Aortic Atherosclerosis (ICD10-I70.0). Calcifications of the aortic valve. 4.  Emphysema (ICD10-J43.9). 5. Scattered small pulmonary nodules, nonspecific. Those at the lung bases appear chronic and unchanged. No routine follow-up is necessary in a low risk patient per consensus guidelines. Electronically Signed   By: Richardean Sale M.D.   On: 04/21/2018 18:53     Resolved Hospital Problem list     Assessment & Plan:   Septic shock: presumed urinary source given recent history of urothelial carcinoma and stent placement, s/p stent removal 11/22 and ? Reinsertion of tent 04/21/18  04/22/18 - still on levophed but at 59mcg with MAP 79  PLAN - xtra 1 Liter LR bolus -  Levophed for MAP > 65 -antibiotics as above - Follow blood, urine cultures.  - Morphine PRN for abdominal/flank pain.   Atrial fibrillation: currently sinus - telemetry monitoring - holding home eliquis, diltiazem - urology clearance needed to restaart eliquuis   Fibromyalgia: with current excruciating leg pain - hold gabapentin while NPO  Hypothyroid - TSH recently 3.39 - convert synthroid to IV while NPO  Hypomagnesemia - replete mag  Best practice:  Diet: ok for oral diet Pain/Anxiety/Delirium protocol (if indicated): PRN morphine VAP protocol (if indicated):  DVT prophylaxis: heparin sq proph dose GI prophylaxis: Pepcid Glucose control:  Mobility: Bed rest Code Status: Full Family Communication:  Patient updated at bedside 04/22/18 Disposition: ICU     ATTESTATION & SIGNATURE   The patient KIYON FIDALGO is critically ill with  multiple organ systems failure and requires high complexity decision making for assessment and support, frequent evaluation and titration of therapies, application of advanced monitoring technologies and extensive interpretation of multiple databases.   Critical Care Time devoted to patient care services described in this note is  30  Minutes. This time reflects time of care of this signee Dr Brand Males. This critical care time does not reflect procedure time, or teaching time or supervisory time of PA/NP/Med student/Med Resident etc but could involve care discussion time     Dr. Brand Males, M.D., [facility].C.P Pulmonary and Critical Care Medicine Staff Physician [facility] Pager: [phone], If no answer or between  15:00h - 7:00h: call [phone]  04/22/2018 9:37 AM

## 2018-04-22 NOTE — Transfer of Care (Signed)
Immediate Anesthesia Transfer of Care Note  Patient: Marvin Gill  Procedure(s) Performed: CYSTOSCOPY WITH STENT PLACEMENT (Left Abdomen)  Patient Location: ICU  Anesthesia Type:General  Level of Consciousness: awake, alert , oriented and patient cooperative  Airway & Oxygen Therapy: Patient Spontanous Breathing and Patient connected to nasal cannula oxygen  Post-op Assessment: Report given to RN, Post -op Vital signs reviewed and stable and Patient moving all extremities X 4  Post vital signs: Reviewed and stable  Last Vitals:  Vitals Value Taken Time  BP 90/64 04/22/2018 12:48 AM  Temp    Pulse 135 04/22/2018 12:46 AM  Resp 16 04/22/2018 12:50 AM  SpO2 95 % 04/22/2018 12:46 AM  Vitals shown include unvalidated device data.  Last Pain:  Vitals:   04/21/18 2135  TempSrc:   PainSc: 5          Complications: No apparent anesthesia complications

## 2018-04-22 NOTE — Anesthesia Procedure Notes (Signed)
Arterial Line Insertion Start/End11/22/2019 11:50 PM, 04/21/2018 11:56 PM Performed by: Oleta Mouse, MD  Patient location: OR. Preanesthetic checklist: patient identified, IV checked, site marked, risks and benefits discussed, surgical consent, monitors and equipment checked, pre-op evaluation, timeout performed and anesthesia consent Left, radial was placed Catheter size: 20 G Hand hygiene performed  and maximum sterile barriers used   Attempts: 1 Procedure performed using ultrasound guided technique. Ultrasound Notes:anatomy identified, needle tip was noted to be adjacent to the nerve/plexus identified, no ultrasound evidence of intravascular and/or intraneural injection and image(s) printed for medical record Following insertion, dressing applied and Biopatch. Post procedure assessment: normal and unchanged  Patient tolerated the procedure well with no immediate complications.

## 2018-04-22 NOTE — Anesthesia Procedure Notes (Signed)
Procedure Name: Intubation Date/Time: 04/21/2018 11:28 PM Performed by: Claris Che, CRNA Pre-anesthesia Checklist: Patient identified, Emergency Drugs available, Suction available, Patient being monitored and Timeout performed Patient Re-evaluated:Patient Re-evaluated prior to induction Oxygen Delivery Method: Circle system utilized Preoxygenation: Pre-oxygenation with 100% oxygen Induction Type: IV induction, Rapid sequence and Cricoid Pressure applied Laryngoscope Size: Mac and 3 Grade View: Grade II Tube type: Oral Tube size: 8.0 mm Number of attempts: 1 Airway Equipment and Method: Stylet Placement Confirmation: ETT inserted through vocal cords under direct vision,  positive ETCO2 and breath sounds checked- equal and bilateral Secured at: 24 cm Tube secured with: Tape Dental Injury: Teeth and Oropharynx as per pre-operative assessment

## 2018-04-23 LAB — CBC WITH DIFFERENTIAL/PLATELET
Abs Immature Granulocytes: 0.02 10*3/uL (ref 0.00–0.07)
BASOS ABS: 0 10*3/uL (ref 0.0–0.1)
BASOS PCT: 0 %
EOS ABS: 0.3 10*3/uL (ref 0.0–0.5)
Eosinophils Relative: 4 %
HCT: 35 % — ABNORMAL LOW (ref 39.0–52.0)
Hemoglobin: 11.4 g/dL — ABNORMAL LOW (ref 13.0–17.0)
IMMATURE GRANULOCYTES: 0 %
Lymphocytes Relative: 20 %
Lymphs Abs: 1.5 10*3/uL (ref 0.7–4.0)
MCH: 29.5 pg (ref 26.0–34.0)
MCHC: 32.6 g/dL (ref 30.0–36.0)
MCV: 90.7 fL (ref 80.0–100.0)
Monocytes Absolute: 0.4 10*3/uL (ref 0.1–1.0)
Monocytes Relative: 5 %
Neutro Abs: 5.3 10*3/uL (ref 1.7–7.7)
Neutrophils Relative %: 71 %
PLATELETS: 168 10*3/uL (ref 150–400)
RBC: 3.86 MIL/uL — ABNORMAL LOW (ref 4.22–5.81)
RDW: 13.9 % (ref 11.5–15.5)
WBC: 7.4 10*3/uL (ref 4.0–10.5)
nRBC: 0 % (ref 0.0–0.2)

## 2018-04-23 LAB — URINE CULTURE: Culture: NO GROWTH

## 2018-04-23 LAB — LACTIC ACID, PLASMA: LACTIC ACID, VENOUS: 1.2 mmol/L (ref 0.5–1.9)

## 2018-04-23 LAB — BASIC METABOLIC PANEL
Anion gap: 6 (ref 5–15)
BUN: 9 mg/dL (ref 8–23)
CO2: 22 mmol/L (ref 22–32)
CREATININE: 0.97 mg/dL (ref 0.61–1.24)
Calcium: 8.3 mg/dL — ABNORMAL LOW (ref 8.9–10.3)
Chloride: 108 mmol/L (ref 98–111)
GFR calc Af Amer: >60 mL/min (ref >60)
Glucose, Bld: 101 mg/dL — ABNORMAL HIGH (ref 70–99)
POTASSIUM: 3.9 mmol/L (ref 3.5–5.1)
SODIUM: 136 mmol/L (ref 135–145)

## 2018-04-23 LAB — PHOSPHORUS: Phosphorus: 2.2 mg/dL — ABNORMAL LOW (ref 2.5–4.6)

## 2018-04-23 LAB — MAGNESIUM: MAGNESIUM: 2 mg/dL (ref 1.7–2.4)

## 2018-04-23 MED ORDER — DILTIAZEM HCL-DEXTROSE 100-5 MG/100ML-% IV SOLN (PREMIX)
5.0000 mg/h | INTRAVENOUS | Status: DC
  Administered 2018-04-23: 10 mg/h via INTRAVENOUS
  Administered 2018-04-23: 5 mg/h via INTRAVENOUS
  Filled 2018-04-23 (×2): qty 100

## 2018-04-23 MED ORDER — APIXABAN 5 MG PO TABS
5.0000 mg | ORAL_TABLET | Freq: Two times a day (BID) | ORAL | Status: DC
  Administered 2018-04-23 – 2018-04-25 (×5): 5 mg via ORAL
  Filled 2018-04-23 (×5): qty 1

## 2018-04-23 MED ORDER — SODIUM CHLORIDE 0.9% FLUSH: 10.0000 mL | INTRAVENOUS | Status: DC | PRN

## 2018-04-23 MED ORDER — WHITE PETROLATUM EX OINT
TOPICAL_OINTMENT | CUTANEOUS | Status: AC
  Administered 2018-04-23: 0.2
  Filled 2018-04-23: qty 28.35

## 2018-04-23 MED ORDER — ACETAMINOPHEN 325 MG PO TABS
650.0000 mg | ORAL_TABLET | Freq: Four times a day (QID) | ORAL | Status: DC | PRN
  Administered 2018-04-23: 650 mg via ORAL
  Filled 2018-04-23: qty 2

## 2018-04-23 MED ORDER — POTASSIUM PHOSPHATES 15 MMOLE/5ML IV SOLN
10.0000 mmol | Freq: Once | INTRAVENOUS | Status: AC
  Administered 2018-04-23: 10 mmol via INTRAVENOUS
  Filled 2018-04-23: qty 3.33

## 2018-04-23 MED ORDER — DILTIAZEM HCL ER COATED BEADS 180 MG PO CP24
180.0000 mg | ORAL_CAPSULE | Freq: Every day | ORAL | Status: DC
  Administered 2018-04-23 – 2018-04-25 (×3): 180 mg via ORAL
  Filled 2018-04-23 (×3): qty 1

## 2018-04-23 NOTE — Progress Notes (Signed)
Text paged Dr Chase Caller - notified that Urology cleared to restart Eliquis

## 2018-04-23 NOTE — Progress Notes (Signed)
Urology Progress Note   2 Days Post-Op  Subjective/ Interval events: Reports pain in his lower extremities, back and arms. Tolerating stent well. NE off this AM with stable blood pressures. Poor po intake. Troponin 0.66. Lactate 1.2, Creatinine 0.97.   Objective: Vital signs in last 24 hours: Temp:  [98.4 F (36.9 C)-101.1 F (38.4 C)] 98.4 F (36.9 C) (11/24 0400) Pulse Rate:  [62-102] 73 (11/24 0600) Resp:  [12-19] 13 (11/24 0600) BP: (97-128)/(49-86) 127/75 (11/24 0600) SpO2:  [92 %-100 %] 94 % (11/24 0600) Arterial Line BP: (97-158)/(50-75) 139/75 (11/24 0600) Weight:  [99.4 kg] 99.4 kg (11/24 0417)  Intake/Output from previous day: 11/23 0701 - 11/24 0700 In: 1889.6 [I.V.:439; IV Piggyback:1450.5] Out: 2350 [Urine:2350] Intake/Output this shift: Total I/O In: 98.8 [I.V.:21.3; IV Piggyback:77.5] Out: 2350 [Urine:2350]  Physical Exam:  General: Alert and oriented CV: Regular rate Lungs: NWOB Abdomen: Soft, NT, ND GU: Foley in place draining clear yellow urine, no CVA tenderness Ext: NT, No erythema  Lab Results: Recent Labs    04/21/18 1424 04/22/18 0345 04/23/18 0408  HGB 14.6 11.5* 11.4*  HCT 46.9 36.4* 35.0*   BMET Recent Labs    04/22/18 0345 04/23/18 0408  NA 136 136  K 3.8 3.9  CL 108 108  CO2 22 22  GLUCOSE 128* 101*  BUN 13 9  CREATININE 1.22 0.97  CALCIUM 7.6* 8.3*     Studies/Results: Dg Cystogram  Result Date: 04/22/2018 CLINICAL DATA:  Recurrent left hydroureteronephrosis with distal ureteral obstruction EXAM: CYSTOGRAM - 3+ VIEW CONTRAST:  See operative note FLUOROSCOPY TIME:  Fluoroscopy Time:  28 seconds Radiation Exposure Index (if provided by the fluoroscopic device): Not available Number of Acquired Spot Images: 4 COMPARISON:  None. FINDINGS: Spot fluoroscopic views during the operative procedure demonstrate left retrograde pyelogram. Mild left hydroureteronephrosis. Retrograde wire access performed into the left upper pole calyx  for placement of a left ureteral stent successfully. IMPRESSION: Successful insertion of a left ureteral stent. Electronically Signed   By: Jerilynn Mages.  Shick M.D.   On: 04/22/2018 08:20   Dg Chest Port 1 View  Result Date: 04/22/2018 CLINICAL DATA:  Central line plate basement. EXAM: PORTABLE CHEST 1 VIEW COMPARISON:  Chest x-ray dated 04/21/2018. FINDINGS: Heart size and mediastinal contours are stable. Lungs are clear. No pleural effusion or pneumothorax seen. RIGHT IJ central line in place with tip adequately positioned at the level of the mid SVC. IMPRESSION: 1. RIGHT IJ central line now in place with tip adequately positioned at the level of the mid SVC. 2. No pneumothorax. Electronically Signed   By: Franki Cabot M.D.   On: 04/22/2018 01:11   Dg Chest Portable 1 View  Result Date: 04/21/2018 CLINICAL DATA:  Fever with low blood pressure and tachycardia. History of bladder cancer and atrial fibrillation. EXAM: PORTABLE CHEST 1 VIEW COMPARISON:  Radiographs 03/05/2018 and 11/30/2017. FINDINGS: 1555 hours. Two views were obtained. The heart size and mediastinal contours are stable. There is mild biapical scarring. The lungs are otherwise clear. There is no pleural effusion or pneumothorax. Slight tracheal deviation to the right appears stable. No acute osseous findings are seen. Multiple telemetry leads overlie the chest. IMPRESSION: No active cardiopulmonary process. Electronically Signed   By: Richardean Sale M.D.   On: 04/21/2018 16:14   Ct Angio Chest/abd/pel For Dissection W And/or Wo Contrast  Result Date: 04/21/2018 CLINICAL DATA:  Generalized abdominal pain with vomiting. Recent ureteral stent removal. Aortic dissection suspected. EXAM: CT ANGIOGRAPHY CHEST, ABDOMEN AND PELVIS  TECHNIQUE: Multidetector CT imaging through the chest, abdomen and pelvis was performed using the standard protocol during bolus administration of intravenous contrast. Multiplanar reconstructed images and MIPs were obtained  and reviewed to evaluate the vascular anatomy. CONTRAST:  163mL ISOVUE-370 IOPAMIDOL (ISOVUE-370) INJECTION 76% COMPARISON:  Chest radiographs 04/21/2018. Abdominopelvic CT 02/08/2018. FINDINGS: CTA CHEST FINDINGS Cardiovascular: Pre contrast images demonstrate minimal atherosclerosis of the aorta and great vessels. There are calcifications of the aortic valve. Following contrast, the aorta opacifies normally. There is no evidence of aortic aneurysm or dissection. The pulmonary arteries are well opacified with contrast. There is no evidence of acute pulmonary embolism. The heart size is normal. There is no pericardial effusion. Mediastinum/Nodes: There are small right hilar lymph nodes. There are no enlarged mediastinal or axillary lymph nodes. The thyroid gland, trachea and esophagus demonstrate no significant findings. Lungs/Pleura: There is no pleural effusion or pneumothorax. There is mild emphysema with mild biapical and bibasilar scarring. There are scattered small pulmonary nodules, including a subpleural nodule in the right lower lobe measuring 5 mm on image 80/7 which is unchanged from previous abdominal CT. In the lingula, there is a 4 mm nodule on image 52/7. Musculoskeletal/Chest wall: There is no chest wall mass or suspicious osseous finding. Review of the MIP images confirms the above findings. CTA ABDOMEN AND PELVIS FINDINGS VASCULAR Aorta: Minimal atherosclerosis.  No aneurysm or dissection. Celiac: Mild calcification at the ostium.  No occlusion or aneurysm. SMA: Normal. Renals: Normal. IMA: Normal Inflow: Minimal iliac atherosclerosis without aneurysm or stenosis. Veins: Limited opacification.  No abnormalities identified. Review of the MIP images confirms the above findings. NON-VASCULAR Hepatobiliary: The pre contrast images demonstrate decreased hepatic density consistent with steatosis. Following contrast. No focal lesion or abnormal enhancement demonstrated. No evidence of gallstones,  gallbladder wall thickening or biliary dilatation. Pancreas: Unremarkable. No pancreatic ductal dilatation or surrounding inflammatory changes. Spleen: Normal in size without focal abnormality. Adrenals/Urinary Tract: Both adrenal glands appear normal. The right kidney appears stable with a nonobstructing 9 mm calculus in its lower pole on image 150/6. There is an adjacent simple cyst. There is no right-sided hydronephrosis or ureteral calculus. No focal left renal abnormality is identified. There is new perinephric and periureteral soft tissue stranding on the left associated with persistent mild dilatation of the left ureter. Left ureteral stent seen on recent radiographs has been removed. There is persistent ureteral wall thickening and hyperenhancement. This protocol does not include delayed post-contrast images through the ureters. Foley catheter is in place. The bladder is incompletely distended with mild wall thickening. No definite stone fragments in either ureter or the bladder. Stomach/Bowel: No evidence of bowel wall thickening, distention or surrounding inflammatory change. The appendix appears normal. Lymphatic: There are stable small lymph nodes within the porta hepatis. Reproductive: Fiducial markers again noted in the prostate gland. No abnormality of the seminal vesicle seen. Other: Stable mild fat in both inguinal canals. Musculoskeletal: No acute or significant osseous findings. Mild lumbar spondylosis. Old rib fractures on the left. Review of the MIP images confirms the above findings. IMPRESSION: 1. No evidence of aortic dissection, aneurysm or large vessel occlusion. 2. Persistent asymmetric left ureteral dilatation and wall thickening following ureteral stent removal. No obstructing calculus identified. This protocol does not include delayed imaging through the ureters, but no high-grade ureteral obstruction or focal urothelial lesion identified. Urology follow-up recommended. 3. Mild Aortic  Atherosclerosis (ICD10-I70.0). Calcifications of the aortic valve. 4.  Emphysema (ICD10-J43.9). 5. Scattered small pulmonary nodules, nonspecific. Those at  the lung bases appear chronic and unchanged. No routine follow-up is necessary in a low risk patient per consensus guidelines. Electronically Signed   By: Richardean Sale M.D.   On: 04/21/2018 18:53    Assessment/Plan:  67 y.o. male s/p left ureteral stent placement for ureteral obstruction in setting of likely urosepsis. Hemodynamics improving with NE off this AM and stable blood pressures.   - Continue empiric broad spectrum antibiotics. Follow-up urine and blood cultures and adjust antibiotics appropriately. Urology will also follow-up urine culture from clinic on 11/22.  - Continue foley catheter until patient is stable, off pressors, afebrile x 24 hours, and on culture-specific antibiotics.  - Urology will arrange outpatient follow-up to discuss of plan for left ureteral stent. Plan for stent to remain in place at time of discharge.    Thank you for this consult.    LOS: 2 days   Dorothey Baseman 04/23/2018, 6:33 AM

## 2018-04-23 NOTE — Progress Notes (Signed)
Called report to 5 [place] - spoke to [employer] - aware pt is on a Cardizem gtt at 12

## 2018-04-23 NOTE — Progress Notes (Signed)
Text page to Urology for DR [facility] - Urology cleared pt to restart Eliquis -

## 2018-04-23 NOTE — Progress Notes (Signed)
RN says patient in a fib rvr 120s  Plan Restart po cardizem - while waiting for it to work - do IV cardizem gtt    SIGNATURE    Dr. Brand Males, M.D., F.C.C.P,  Pulmonary and Critical Care Medicine Staff Physician, [facility] Director - Interstitial Lung Disease  Program  [facility] [place], [place], [address]  Pager: [phone], If no answer or between  15:00h - 7:00h: call [phone] Telephone: [phone]  12:57 PM 04/23/2018

## 2018-04-23 NOTE — Progress Notes (Signed)
Pt transported to [id number] per recliner - with monitor and Cardizem gtt. Pt says he will call his family to make sure they know his new room number.

## 2018-04-23 NOTE — Anesthesia Postprocedure Evaluation (Addendum)
Anesthesia Post Note  Patient: Marvin Gill  Procedure(s) Performed: CYSTOSCOPY WITH STENT PLACEMENT (Left Abdomen)     Patient location during evaluation: SICU Anesthesia Type: General Level of consciousness: awake and patient cooperative Pain management: pain level controlled Vital Signs Assessment: post-procedure vital signs reviewed and stable Respiratory status: spontaneous breathing and respiratory function stable Cardiovascular status: stable Postop Assessment: no apparent nausea or vomiting Anesthetic complications: no    Last Vitals:  Vitals:   04/23/18 1600 04/23/18 1714  BP: 118/69 110/76  Pulse: 90 98  Resp: 20 18  Temp:  37.2 C  SpO2: 92%     Last Pain:  Vitals:   04/23/18 1714  TempSrc: Oral  PainSc:                  Kachina Niederer

## 2018-04-23 NOTE — Progress Notes (Addendum)
NAME:  Marvin Gill, MRN:  [id number], DOB:  [birth year], LOS: 2 ADMISSION DATE:  04/21/2018, CONSULTATION DATE:  11/22 REFERRING MD:  Dr. Tyrone Nine EDP, CHIEF COMPLAINT:  Sepsis   Brief History    67 year old male with PMH as below, which is significant for atrial fibrillation on eliquis, AAA, urothelial carcinoma, Fibromyalgia, and CAD. He was recently diagnosed with both urothelial carcinoma and atrial fibrillaion. AF treated with diltiazem and eliquis. He has been followed by urology and underwent cystoscopy with left ureteroscopy, holmium laser ablation of the left distal ureteral tumor, bladder biopsy, and left JJ stent placement on 04/07/18. Marland Kitchen He had an admission since that time for urologic infection from 10/6-10/10. He was treated with antibiotics and discharged on keppra. There was some concern he may have been bacteremic, but this was never proven by culture.   He had stent removed 11/22 as an outpatient procedure. Shortly after removal he returned home and attempted to mow the yard, but developed severe abdominal pain. Also developed leg pain and chills. In the ED he was found to be tachycardic, which was thought to represent AF and was started on diltiazem infusion. He then became bradycaric and developed fever, hypotension. Lactic elevated. He was then felt to be septic and given 4L IVF, but remained tachycardic. Repeat lactic elevated further. CT of the abdomen demonstrated left ureteral dialtion and wall thickening. No obvious obstruction, but obstruction not ruled out. He was treated with broad spectrum antibiotics and PCCM was asked to admit.   Significant Hospital Events   11/22 admit 04/21/18 - 11/22 - Postoperative diagnosis: Left hydronephrosis and sepsis Surgery: Cystoscopy and insertion of left ureteral stent. Surgeon: Dr. Nicki Reaper MacDiarm  04/22/18 - fever dropped from 103F to 42F . Levophed 46mcg ongoing Had A Fib RVR and converted to NSR post op yesterday. Per Urology " "  Continue foley catheter until patient is stable, off pressors, afebrile x 24 hours, and on culture-specific antibiotics. - Urology will arrange outpatient follow-up to discuss of plan for left ureteral stent. Plan for stent to remain in place at time of discharge. "    Consults:  Urology 11/22 >  Procedures:  R IJ 04/21/18  Urology 11/22 - Cystoscopy and insertion of left ureteral stent Surgeon: Dr. Bjorn Loser   Significant Diagnostic Tests:  CT angio abdomen/pelvis/chest 11/22 > Persistent asymmetric left ureteral dilatation and wall thickening following ureteral stent removal. No obstructing calculus Identified. This protocol does not include delayed imaging through the ureters, but no high-grade ureteral obstruction or focal urothelial lesion identified. Scattered small pulmonary nodules, nonspecific.   Micro Data:  Blood 11/22 > Urine 11/22 >  Antimicrobials:  Zosyn 11/22 > Vancomycin 11/22 >   Interim history/subjective:   04/23/2018 - off levophed. C/o deep seated abdominal pain/pressure. MIld,. Getting better. No radiation. Not related to meal. No relationship to palpation.Ur OP from foley is good and clear   Objective   Blood pressure 127/75, pulse 73, temperature 98.7 F (37.1 C), temperature source Oral, resp. rate 13, weight 99.4 kg, SpO2 94 %.        Intake/Output Summary (Last 24 hours) at 04/23/2018 0833 Last data filed at 04/23/2018 0600 Gross per 24 hour  Intake 1889.55 ml  Output 2350 ml  Net -460.45 ml   Filed Weights   04/22/18 0100 04/23/18 0417  Weight: 102.1 kg 99.4 kg  General Appearance:  Looks better.  Head:  Normocephalic, without obvious abnormality, atraumatic Eyes:  PERRL - yes, conjunctiva/corneas -  clear     Ears:  Normal external ear canals, both ears Nose:  G tube - no Throat:  ETT TUBE - no , OG tube - no Neck:  Supple,  No enlargement/tenderness/nodules Lungs: Clear to auscultation bilaterally,  Heart:  S1 and S2 normal, no  murmur, CVP - no.  Pressors - no Abdomen:  Soft, no masses, no organomegaly Genitalia / Rectal:  Not done Extremities:  Extremities- intact. Mild edema + Skin:  ntact in exposed areas . Sacral area - intact Neurologic:  Sedation - none -> RASS - +1 . Moves all 4s - yes. CAM-ICU - neg . Orientation - x3+       LABS    PULMONARY No results for input(s): PHART, PCO2ART, PO2ART, HCO3, TCO2, O2SAT in the last 168 hours.  Invalid input(s): PCO2, PO2  CBC Recent Labs  Lab 04/21/18 1424 04/22/18 0345 04/23/18 0408  HGB 14.6 11.5* 11.4*  HCT 46.9 36.4* 35.0*  WBC 9.7 17.6* 7.4  PLT 280 217 168    COAGULATION Recent Labs  Lab 04/21/18 2236  INR 1.35    CARDIAC   Recent Labs  Lab 04/22/18 1056  TROPONINI 0.66*   No results for input(s): PROBNP in the last 168 hours.   CHEMISTRY Recent Labs  Lab 04/16/18 1335 04/21/18 1424 04/22/18 0345 04/23/18 0408  NA 142 141 136 136  K 4.0 3.4* 3.8 3.9  CL 108 107 108 108  CO2 26 23 22 22   GLUCOSE 93 108* 128* 101*  BUN 16 12 13 9   CREATININE 0.79 1.05 1.22 0.97  CALCIUM 9.0 9.1 7.6* 8.3*  MG  --   --  1.3* 2.0  PHOS  --   --  2.9 2.2*   Estimated Creatinine Clearance: 94.2 mL/min (by C-G formula based on SCr of 0.97 mg/dL).   LIVER Recent Labs  Lab 04/21/18 1424 04/21/18 2236  AST 20  --   ALT 20  --   ALKPHOS 78  --   BILITOT 1.0  --   PROT 6.9  --   ALBUMIN 3.8  --   INR  --  1.35     INFECTIOUS Recent Labs  Lab 04/21/18 2236 04/21/18 2244 04/22/18 0345 04/23/18 0408  LATICACIDVEN 3.2* 3.13* 2.3* 1.2  PROCALCITON 16.06  --   --   --      ENDOCRINE CBG (last 3)  No results for input(s): GLUCAP in the last 72 hours.       IMAGING x48h  - image(s) personally visualized  -   highlighted in bold Dg Cystogram  Result Date: 04/22/2018 CLINICAL DATA:  Recurrent left hydroureteronephrosis with distal ureteral obstruction EXAM: CYSTOGRAM - 3+ VIEW CONTRAST:  See operative note FLUOROSCOPY  TIME:  Fluoroscopy Time:  28 seconds Radiation Exposure Index (if provided by the fluoroscopic device): Not available Number of Acquired Spot Images: 4 COMPARISON:  None. FINDINGS: Spot fluoroscopic views during the operative procedure demonstrate left retrograde pyelogram. Mild left hydroureteronephrosis. Retrograde wire access performed into the left upper pole calyx for placement of a left ureteral stent successfully. IMPRESSION: Successful insertion of a left ureteral stent. Electronically Signed   By: Jerilynn Mages.  Shick M.D.   On: 04/22/2018 08:20   Dg Chest Port 1 View  Result Date: 04/22/2018 CLINICAL DATA:  Central line plate basement. EXAM: PORTABLE CHEST 1 VIEW COMPARISON:  Chest x-ray dated 04/21/2018. FINDINGS: Heart size and mediastinal contours are stable. Lungs are clear. No pleural effusion or pneumothorax seen. RIGHT IJ central line  in place with tip adequately positioned at the level of the mid SVC. IMPRESSION: 1. RIGHT IJ central line now in place with tip adequately positioned at the level of the mid SVC. 2. No pneumothorax. Electronically Signed   By: Franki Cabot M.D.   On: 04/22/2018 01:11   Dg Chest Portable 1 View  Result Date: 04/21/2018 CLINICAL DATA:  Fever with low blood pressure and tachycardia. History of bladder cancer and atrial fibrillation. EXAM: PORTABLE CHEST 1 VIEW COMPARISON:  Radiographs 03/05/2018 and 11/30/2017. FINDINGS: 1555 hours. Two views were obtained. The heart size and mediastinal contours are stable. There is mild biapical scarring. The lungs are otherwise clear. There is no pleural effusion or pneumothorax. Slight tracheal deviation to the right appears stable. No acute osseous findings are seen. Multiple telemetry leads overlie the chest. IMPRESSION: No active cardiopulmonary process. Electronically Signed   By: Richardean Sale M.D.   On: 04/21/2018 16:14   Ct Angio Chest/abd/pel For Dissection W And/or Wo Contrast  Result Date: 04/21/2018 CLINICAL DATA:   Generalized abdominal pain with vomiting. Recent ureteral stent removal. Aortic dissection suspected. EXAM: CT ANGIOGRAPHY CHEST, ABDOMEN AND PELVIS TECHNIQUE: Multidetector CT imaging through the chest, abdomen and pelvis was performed using the standard protocol during bolus administration of intravenous contrast. Multiplanar reconstructed images and MIPs were obtained and reviewed to evaluate the vascular anatomy. CONTRAST:  178mL ISOVUE-370 IOPAMIDOL (ISOVUE-370) INJECTION 76% COMPARISON:  Chest radiographs 04/21/2018. Abdominopelvic CT 02/08/2018. FINDINGS: CTA CHEST FINDINGS Cardiovascular: Pre contrast images demonstrate minimal atherosclerosis of the aorta and great vessels. There are calcifications of the aortic valve. Following contrast, the aorta opacifies normally. There is no evidence of aortic aneurysm or dissection. The pulmonary arteries are well opacified with contrast. There is no evidence of acute pulmonary embolism. The heart size is normal. There is no pericardial effusion. Mediastinum/Nodes: There are small right hilar lymph nodes. There are no enlarged mediastinal or axillary lymph nodes. The thyroid gland, trachea and esophagus demonstrate no significant findings. Lungs/Pleura: There is no pleural effusion or pneumothorax. There is mild emphysema with mild biapical and bibasilar scarring. There are scattered small pulmonary nodules, including a subpleural nodule in the right lower lobe measuring 5 mm on image 80/7 which is unchanged from previous abdominal CT. In the lingula, there is a 4 mm nodule on image 52/7. Musculoskeletal/Chest wall: There is no chest wall mass or suspicious osseous finding. Review of the MIP images confirms the above findings. CTA ABDOMEN AND PELVIS FINDINGS VASCULAR Aorta: Minimal atherosclerosis.  No aneurysm or dissection. Celiac: Mild calcification at the ostium.  No occlusion or aneurysm. SMA: Normal. Renals: Normal. IMA: Normal Inflow: Minimal iliac  atherosclerosis without aneurysm or stenosis. Veins: Limited opacification.  No abnormalities identified. Review of the MIP images confirms the above findings. NON-VASCULAR Hepatobiliary: The pre contrast images demonstrate decreased hepatic density consistent with steatosis. Following contrast. No focal lesion or abnormal enhancement demonstrated. No evidence of gallstones, gallbladder wall thickening or biliary dilatation. Pancreas: Unremarkable. No pancreatic ductal dilatation or surrounding inflammatory changes. Spleen: Normal in size without focal abnormality. Adrenals/Urinary Tract: Both adrenal glands appear normal. The right kidney appears stable with a nonobstructing 9 mm calculus in its lower pole on image 150/6. There is an adjacent simple cyst. There is no right-sided hydronephrosis or ureteral calculus. No focal left renal abnormality is identified. There is new perinephric and periureteral soft tissue stranding on the left associated with persistent mild dilatation of the left ureter. Left ureteral stent seen on recent radiographs  has been removed. There is persistent ureteral wall thickening and hyperenhancement. This protocol does not include delayed post-contrast images through the ureters. Foley catheter is in place. The bladder is incompletely distended with mild wall thickening. No definite stone fragments in either ureter or the bladder. Stomach/Bowel: No evidence of bowel wall thickening, distention or surrounding inflammatory change. The appendix appears normal. Lymphatic: There are stable small lymph nodes within the porta hepatis. Reproductive: Fiducial markers again noted in the prostate gland. No abnormality of the seminal vesicle seen. Other: Stable mild fat in both inguinal canals. Musculoskeletal: No acute or significant osseous findings. Mild lumbar spondylosis. Old rib fractures on the left. Review of the MIP images confirms the above findings. IMPRESSION: 1. No evidence of aortic  dissection, aneurysm or large vessel occlusion. 2. Persistent asymmetric left ureteral dilatation and wall thickening following ureteral stent removal. No obstructing calculus identified. This protocol does not include delayed imaging through the ureters, but no high-grade ureteral obstruction or focal urothelial lesion identified. Urology follow-up recommended. 3. Mild Aortic Atherosclerosis (ICD10-I70.0). Calcifications of the aortic valve. 4.  Emphysema (ICD10-J43.9). 5. Scattered small pulmonary nodules, nonspecific. Those at the lung bases appear chronic and unchanged. No routine follow-up is necessary in a low risk patient per consensus guidelines. Electronically Signed   By: Richardean Sale M.D.   On: 04/21/2018 18:53     Resolved Hospital Problem list     Assessment & Plan:   Septic shock: presumed urinary source given recent history of urothelial carcinoma and stent placement, s/p stent removal 11/22 and  Reinsertion of stent 04/21/18  04/23/18 - off pressors since 2am  PLAN - dc aline - dc foley based on urology guidelines above from 04/22/18 - aBx titration 04/24/18 based on culture - if negative can atleast stop vanc  Atrial fibrillation: currently sinus - telemetry monitoring - holding home eliquis, diltiazem - urology clearance needed to restaart eliquuis (RN goING call them) - dc aline   Fibromyalgia: with current excruciating leg pain -home gabapentin  Hypothyroid - TSH recently 3.39 - po synthroid  Low phos - replete phos  Nonspecific abd pressure - RN will run it by urology  Best practice:  Diet: oral diet Pain/Anxiety/Delirium protocol (if indicated): PRN morphine VAP protocol (if indicated):  DVT prophylaxis: heparin sq proph dose. RN asking about eliquis GI prophylaxis: Pepcid Glucose control:  Mobility: OOB to chair Code Status: Full Family Communication:  Patient updated at bedside 04/22/18. Patient 04/23/2018  Disposition: move to tele and TRH  primary from 04/24/18 and cc.m off - D/w Dr Lenor Coffin    Dr. Brand Males, M.D., F.C.C.P,  Pulmonary and Critical Care Medicine Staff Physician, [place] Director - Interstitial Lung Disease  Program  P[facility] [place] [facility] [place], [place], [address]  Pager: [phone], If no answer or between  15:00h - 7:00h: call [phone] Telephone: [phone]  8:53 AM 04/23/2018

## 2018-04-24 ENCOUNTER — Encounter (HOSPITAL_COMMUNITY): Payer: Self-pay | Admitting: Urology

## 2018-04-24 DIAGNOSIS — E039 Hypothyroidism, unspecified: Secondary | ICD-10-CM

## 2018-04-24 DIAGNOSIS — I482 Chronic atrial fibrillation, unspecified: Secondary | ICD-10-CM

## 2018-04-24 LAB — CBC WITH DIFFERENTIAL/PLATELET
Abs Immature Granulocytes: 0.02 10*3/uL (ref 0.00–0.07)
BASOS ABS: 0 10*3/uL (ref 0.0–0.1)
Basophils Relative: 0 %
EOS ABS: 0.3 10*3/uL (ref 0.0–0.5)
EOS PCT: 5 %
HEMATOCRIT: 38.2 % — AB (ref 39.0–52.0)
HEMOGLOBIN: 12.6 g/dL — AB (ref 13.0–17.0)
Immature Granulocytes: 0 %
LYMPHS ABS: 1.2 10*3/uL (ref 0.7–4.0)
LYMPHS PCT: 23 %
MCH: 29.8 pg (ref 26.0–34.0)
MCHC: 33 g/dL (ref 30.0–36.0)
MCV: 90.3 fL (ref 80.0–100.0)
MONOS PCT: 6 %
Monocytes Absolute: 0.3 10*3/uL (ref 0.1–1.0)
Neutro Abs: 3.5 10*3/uL (ref 1.7–7.7)
Neutrophils Relative %: 66 %
Platelets: 195 10*3/uL (ref 150–400)
RBC: 4.23 MIL/uL (ref 4.22–5.81)
RDW: 13.8 % (ref 11.5–15.5)
WBC: 5.3 10*3/uL (ref 4.0–10.5)
nRBC: 0 % (ref 0.0–0.2)

## 2018-04-24 LAB — MAGNESIUM: Magnesium: 1.9 mg/dL (ref 1.7–2.4)

## 2018-04-24 LAB — BASIC METABOLIC PANEL
Anion gap: 5 (ref 5–15)
BUN: 7 mg/dL — AB (ref 8–23)
CALCIUM: 8.6 mg/dL — AB (ref 8.9–10.3)
CHLORIDE: 109 mmol/L (ref 98–111)
CO2: 26 mmol/L (ref 22–32)
CREATININE: 1 mg/dL (ref 0.61–1.24)
Glucose, Bld: 117 mg/dL — ABNORMAL HIGH (ref 70–99)
POTASSIUM: 3.6 mmol/L (ref 3.5–5.1)
Sodium: 140 mmol/L (ref 135–145)

## 2018-04-24 LAB — PROCALCITONIN: Procalcitonin: 4.55 ng/mL

## 2018-04-24 LAB — PHOSPHORUS: Phosphorus: 3.2 mg/dL (ref 2.5–4.6)

## 2018-04-24 LAB — TROPONIN I: TROPONIN I: 0.17 ng/mL — AB (ref <0.03)

## 2018-04-24 MED ORDER — TAMSULOSIN HCL 0.4 MG PO CAPS
0.4000 mg | ORAL_CAPSULE | Freq: Every day | ORAL | Status: DC
  Administered 2018-04-24 – 2018-04-25 (×2): 0.4 mg via ORAL
  Filled 2018-04-24 (×2): qty 1

## 2018-04-24 MED ORDER — SODIUM CHLORIDE 0.9 % IV SOLN
1.0000 g | Freq: Every day | INTRAVENOUS | Status: AC
  Administered 2018-04-24: 1 g via INTRAVENOUS
  Filled 2018-04-24: qty 10

## 2018-04-24 MED ORDER — ALPRAZOLAM 0.5 MG PO TABS: 0.5000 mg | ORAL_TABLET | Freq: Every evening | ORAL | Status: DC | PRN

## 2018-04-24 NOTE — Evaluation (Signed)
Physical Therapy Evaluation Patient Details Name: Marvin Gill MRN: [id number] DOB: 05-29-1951 Today's Date: 04/24/2018   History of Present Illness  Patient is a 67 y/o male status post ureteral stent removal presents later same day (11/22) with abdominal pain, fevers, chills. CT with ureteral edema and possible obstruction. Cystoscopy and insertion of left ureteral stent 04/21/18.  PMH significant for atrial fibrillation on eliquis, AAA, urothelial carcinoma, Fibromyalgia, and CAD.    Clinical Impression  Marvin Gill is a very pleasant 67 y/o male admitted with the above listed diagnosis. Patient reports that prior to admission he was Independent with all aspects of mobility and ADLs. Patient today requiring general min guard for safety and immediate standing balance. Patient reporting "stinging/shocking" pain intermittently with MD aware and present during session with no concerns. Do not anticipate patient requiring PT services at discharge, however PT to follow acutely to ensure safe and independent functional mobility prior to return home.     Follow Up Recommendations No PT follow up    Equipment Recommendations  None recommended by PT    Recommendations for Other Services       Precautions / Restrictions Precautions Precautions: Fall Restrictions Weight Bearing Restrictions: No      Mobility  Bed Mobility Overal bed mobility: Modified Independent             General bed mobility comments: use of bed rails and increased time and effort  Transfers Overall transfer level: Needs assistance Equipment used: None Transfers: Sit to/from Stand Sit to Stand: Supervision         General transfer comment: for immediate standing balance and safety  Ambulation/Gait Ambulation/Gait assistance: Min guard Gait Distance (Feet): 120 Feet Assistive device: None Gait Pattern/deviations: Step-through pattern;Decreased stride length;Drifts right/left Gait velocity:  decreased   General Gait Details: slow pace of gait; very cautious  Stairs            Wheelchair Mobility    Modified Rankin (Stroke Patients Only)       Balance Overall balance assessment: Mild deficits observed, not formally tested                                           Pertinent Vitals/Pain Pain Assessment: No/denies pain    Home Living Family/patient expects to be discharged to:: Private residence Living Arrangements: Children Available Help at Discharge: Family;Available PRN/intermittently Type of Home: House Home Access: Stairs to enter Entrance Stairs-Rails: Right Entrance Stairs-Number of Steps: 4 Home Layout: One level Home Equipment: None      Prior Function Level of Independence: Independent         Comments: drives, hunts, fishes     Hand Dominance        Extremity/Trunk Assessment   Upper Extremity Assessment Upper Extremity Assessment: Overall WFL for tasks assessed    Lower Extremity Assessment Lower Extremity Assessment: Overall WFL for tasks assessed    Cervical / Trunk Assessment Cervical / Trunk Assessment: Normal  Communication   Communication: No difficulties  Cognition Arousal/Alertness: Awake/alert Behavior During Therapy: WFL for tasks assessed/performed Overall Cognitive Status: Within Functional Limits for tasks assessed                                        General Comments General comments (skin integrity, edema,  etc.): very motivated to participate    Exercises     Assessment/Plan    PT Assessment Patient needs continued PT services  PT Problem List Decreased strength;Decreased balance;Decreased mobility       PT Treatment Interventions Gait training;Stair training;Functional mobility training;Therapeutic activities;Therapeutic exercise;Balance training;Patient/family education    PT Goals (Current goals can be found in the Care Plan section)  Acute Rehab PT  Goals Patient Stated Goal: return home PT Goal Formulation: With patient Time For Goal Achievement: 05/01/18 Potential to Achieve Goals: Good    Frequency Min 4X/week   Barriers to discharge        Co-evaluation               AM-PAC PT "6 Clicks" Mobility  Outcome Measure Help needed turning from your back to your side while in a flat bed without using bedrails?: None Help needed moving from lying on your back to sitting on the side of a flat bed without using bedrails?: A Little Help needed moving to and from a bed to a chair (including a wheelchair)?: A Little Help needed standing up from a chair using your arms (e.g., wheelchair or bedside chair)?: A Little Help needed to walk in hospital room?: A Little Help needed climbing 3-5 steps with a railing? : A Little 6 Click Score: 19    End of Session Equipment Utilized During Treatment: Gait belt Activity Tolerance: Patient tolerated treatment well Patient left: in chair;with call bell/phone within reach;with nursing/sitter in room Nurse Communication: Mobility status PT Visit Diagnosis: Unsteadiness on feet (R26.81);Muscle weakness (generalized) (M62.81)    Time: 0998-3382 PT Time Calculation (min) (ACUTE ONLY): 26 min   Charges:   PT Evaluation $PT Eval Moderate Complexity: 1 Mod PT Treatments $Gait Training: 8-22 mins       Lanney Gins, PT, DPT Supplemental Physical Therapist 04/24/18 11:07 AM Pager: [phone] Office: [phone]

## 2018-04-24 NOTE — Progress Notes (Signed)
PROGRESS NOTE                                                                                                                                                                                                             Patient Demographics:    Marvin Gill, is a 67 y.o. male, DOB - 1951/04/08, RCV:[id number]  Admit date - 04/21/2018   Admitting Physician Kandice Hams, MD  Outpatient Primary MD for the patient is Lavone Orn, MD  LOS - 3  Chief Complaint  Patient presents with  . Abdominal Pain    N/V       Brief Narrative  67 year old male with PMH as below, which is significant for atrial fibrillation on eliquis, AAA, urothelial carcinoma, Fibromyalgia, and CAD. He was recently diagnosed with both urothelial carcinoma and atrial fibrillaion. AF treated with diltiazem and eliquis. He has been followed by urology and underwent cystoscopy with left ureteroscopy, holmium laser ablation of the left distal ureteral tumor, bladder biopsy, and left JJ stent placement on 04/07/18.   He had stent removed 11/22 as an outpatient procedure. Shortly after removal he returned home and attempted to mow the yard, but developed severe abdominal pain. Also developed leg pain and chills he was diagnosed with sepsis and admitted by critical care.  Urology was consulted another stent reinsertion on 04/21/2018, he required IV antibiotics and Levophed, he was stabilized and transferred to hospitalist service on 04/24/2018..     Subjective:    Marvin Gill today has, No headache, No chest pain, No abdominal pain - No Nausea, No new weakness tingling or numbness, No Cough - SOB.   Assessment  & Plan :      1.  Septic shock likely caused by gram-negative infection arising from left ureteric stent related infection present on admission.  He definitely had bacteremia clinically, very high procalcitonin unfortunately his cultures have been negative, at this point his sepsis  pathophysiology has resolved and I will transition him to Rocephin, MRSA PCR negative in the nares.  Stop vancomycin and Zosyn.  Advance activity, discussed with urologist Dr. Junious Silk on 04/24/2018, will DC Foley catheter and prepare for discharge tomorrow if he remains stable.  2.  Chronic atrial fibrillation and RVR.  Mali vas 2 score of at least 3.  Recent echo noted, RVR likely due to sepsis, currently in rate control, continue oral Cardizem and Eliquis.  EKG when in sinus rhythm is unremarkable.  3.  Hypothyroidism.  Continue home dose  Synthroid.  4.  GERD. PPI.   Family Communication  :  None  Code Status :  Full  Disposition Plan  :  Ned  Consults  :  Urology, PCCM  Procedures  :    R IJ 04/21/18 will be removed 04/24/2018  Urology 11/22 - Cystoscopy and removal of old left ureteric stent and replacement/insertion of left ureteral stent Surgeon: Dr. Nicki Reaper MacDiarmid  CT -  1. No evidence of aortic dissection, aneurysm or large vessel occlusion. 2. Persistent asymmetric left ureteral dilatation and wall thickening following ureteral stent removal. No obstructing calculus identified. This protocol does not include delayed imaging through the ureters, but no high-grade ureteral obstruction or focal urothelial lesion identified. Urology follow-up recommended. 3. Mild Aortic Atherosclerosis (ICD10-I70.0). Calcifications of the aortic valve. 4.  Emphysema (ICD10-J43.9). 5. Scattered small pulmonary nodules, nonspecific. Those at the lung bases appear chronic and unchanged. No routine follow-up is necessary in a low risk patient per consensus guidelines.  TTE 02/2018 - noted EF of 55 to 60%, no significant wall motion abnormality.   DVT Prophylaxis  :  Eliquis  Lab Results  Component Value Date   PLT 195 04/24/2018    Diet :  Diet Order            Diet Heart Room service appropriate? Yes; Fluid consistency: Thin  Diet effective now               Inpatient  Medications Scheduled Meds: . apixaban  5 mg Oral BID  . diltiazem  180 mg Oral Daily  . gabapentin  800 mg Oral TID  . levothyroxine  50 mcg Oral Q0600  . pantoprazole  40 mg Oral Daily   Continuous Infusions: . sodium chloride    . cefTRIAXone (ROCEPHIN)  IV 1 g (04/24/18 0802)   PRN Meds:.acetaminophen, ALPRAZolam, morphine injection, ondansetron, sodium chloride flush  Antibiotics  :   Anti-infectives (From admission, onward)   Start     Dose/Rate Route Frequency Ordered Stop   04/24/18 0800  cefTRIAXone (ROCEPHIN) 1 g in sodium chloride 0.9 % 100 mL IVPB     1 g 200 mL/hr over 30 Minutes Intravenous Daily 04/24/18 0712     04/22/18 0600  vancomycin (VANCOCIN) IVPB 1000 mg/200 mL premix  Status:  Discontinued     1,000 mg 200 mL/hr over 60 Minutes Intravenous Every 12 hours 04/21/18 1631 04/24/18 0904   04/22/18 0100  piperacillin-tazobactam (ZOSYN) IVPB 3.375 g  Status:  Discontinued     3.375 g 12.5 mL/hr over 240 Minutes Intravenous Every 8 hours 04/21/18 2212 04/24/18 0715   04/21/18 1645  vancomycin (VANCOCIN) 1,500 mg in sodium chloride 0.9 % 500 mL IVPB     1,500 mg 250 mL/hr over 120 Minutes Intravenous  Once 04/21/18 1631 04/21/18 1959   04/21/18 1630  piperacillin-tazobactam (ZOSYN) IVPB 3.375 g     3.375 g 100 mL/hr over 30 Minutes Intravenous  Once 04/21/18 1615 04/21/18 1734        Objective:   Vitals:   04/23/18 2055 04/24/18 0115 04/24/18 0515 04/24/18 0904  BP: 128/81 (!) 91/55 104/72 111/79  Pulse: 66 (!) 55 60 62  Resp: 17  (!) 23   Temp: 98.4 F (36.9 C) 97.6 F (36.4 C) 97.8 F (36.6 C) 97.6 F (36.4 C)  TempSrc:  Oral Oral Axillary  SpO2: 97% 90% 94% 96%  Weight:      Height:        Wt Readings from Last  3 Encounters:  04/23/18 97.5 kg  04/13/18 95.1 kg  04/07/18 95.1 kg     Intake/Output Summary (Last 24 hours) at 04/24/2018 1012 Last data filed at 04/24/2018 0800 Gross per 24 hour  Intake 3709.55 ml  Output 3175 ml  Net  534.55 ml     Physical Exam  Awake Alert, Oriented X 3, No new F.N deficits, Normal affect [place].AT,PERRAL Supple Neck,No JVD, No cervical lymphadenopathy appriciated.  Symmetrical Chest wall movement, Good air movement bilaterally, CTAB iRRR,No Gallops,Rubs or new Murmurs, No Parasternal Heave +ve B.Sounds, Abd Soft, No tenderness, No organomegaly appriciated, No rebound - guarding or rigidity. No Cyanosis, Clubbing or edema, No new Rash or bruise      Data Review:    CBC Recent Labs  Lab 04/21/18 1424 04/22/18 0345 04/23/18 0408 04/24/18 0407  WBC 9.7 17.6* 7.4 5.3  HGB 14.6 11.5* 11.4* 12.6*  HCT 46.9 36.4* 35.0* 38.2*  PLT 280 217 168 195  MCV 91.4 91.5 90.7 90.3  MCH 28.5 28.9 29.5 29.8  MCHC 31.1 31.6 32.6 33.0  RDW 13.4 13.8 13.9 13.8  LYMPHSABS 0.9  --  1.5 1.2  MONOABS 0.1  --  0.4 0.3  EOSABS 0.0  --  0.3 0.3  BASOSABS 0.0  --  0.0 0.0    Chemistries  Recent Labs  Lab 04/21/18 1424 04/22/18 0345 04/23/18 0408 04/24/18 0407  NA 141 136 136 140  K 3.4* 3.8 3.9 3.6  CL 107 108 108 109  CO2 23 22 22 26   GLUCOSE 108* 128* 101* 117*  BUN 12 13 9  7*  CREATININE 1.05 1.22 0.97 1.00  CALCIUM 9.1 7.6* 8.3* 8.6*  MG  --  1.3* 2.0 1.9  AST 20  --   --   --   ALT 20  --   --   --   ALKPHOS 78  --   --   --   BILITOT 1.0  --   --   --    ------------------------------------------------------------------------------------------------------------------ No results for input(s): CHOL, HDL, LDLCALC, TRIG, CHOLHDL, LDLDIRECT in the last 72 hours.  No results found for: HGBA1C ------------------------------------------------------------------------------------------------------------------ No results for input(s): TSH, T4TOTAL, T3FREE, THYROIDAB in the last 72 hours.  Invalid input(s): FREET3 ------------------------------------------------------------------------------------------------------------------ No results for input(s): VITAMINB12, FOLATE, FERRITIN,  TIBC, IRON, RETICCTPCT in the last 72 hours.  Coagulation profile Recent Labs  Lab 04/21/18 2236  INR 1.35    No results for input(s): DDIMER in the last 72 hours.  Cardiac Enzymes Recent Labs  Lab 04/22/18 1056 04/24/18 0814  TROPONINI 0.66* 0.17*   ------------------------------------------------------------------------------------------------------------------ No results found for: BNP  Micro Results Recent Results (from the past 240 hour(s))  Urine culture     Status: None   Collection Time: 04/16/18  2:45 PM  Result Value Ref Range Status   Specimen Description   Final    URINE, CATHETERIZED Performed at [facility] [address], [place], [place] [address]    Special Requests   Final    Normal Performed at [facility], [place] [address], [place], [place] [address]    Culture   Final    NO GROWTH Performed at [facility], [place] [address] [place], [place] [address]    Report Status 04/18/2018 FINAL  Final  Blood culture (routine x 2)     Status: None (Preliminary result)   Collection Time: 04/21/18  3:49 PM  Result Value Ref Range Status   Specimen Description BLOOD LEFT  ANTECUBITAL  Final   Special Requests   Final    BOTTLES DRAWN AEROBIC AND ANAEROBIC Blood Culture adequate volume   Culture   Final    NO GROWTH 3 DAYS Performed at [facility], [address] [place], [place] [address]    Report Status PENDING  Incomplete  Urine culture     Status: None   Collection Time: 04/21/18  3:50 PM  Result Value Ref Range Status   Specimen Description URINE, RANDOM  Final   Special Requests NONE  Final   Culture   Final    NO GROWTH Performed at [facility], [address], [place], [place] [address]    Report Status 04/23/2018 FINAL  Final  MRSA PCR Screening     Status: None   Collection Time: 04/22/18 12:52 AM  Result Value Ref Range Status   MRSA by PCR NEGATIVE NEGATIVE Final    Comment:         The GeneXpert MRSA Assay (FDA approved for NASAL specimens only), is one component of a comprehensive MRSA colonization surveillance program. It is not intended to diagnose MRSA infection nor to guide or monitor treatment for MRSA infections. Performed at [facility], [place] [address] [place], [place] [address]     Radiology Reports Dg Cystogram  Result Date: 04/22/2018 CLINICAL DATA:  Recurrent left hydroureteronephrosis with distal ureteral obstruction EXAM: CYSTOGRAM - 3+ VIEW CONTRAST:  See operative note FLUOROSCOPY TIME:  Fluoroscopy Time:  28 seconds Radiation Exposure Index (if provided by the fluoroscopic device): Not available Number of Acquired Spot Images: 4 COMPARISON:  None. FINDINGS: Spot fluoroscopic views during the operative procedure demonstrate left retrograde pyelogram. Mild left hydroureteronephrosis. Retrograde wire access performed into the left upper pole calyx for placement of a left ureteral stent successfully. IMPRESSION: Successful insertion of a left ureteral stent. Electronically Signed   By: Jerilynn Mages.  Shick M.D.   On: 04/22/2018 08:20   Dg Chest Port 1 View  Result Date: 04/22/2018 CLINICAL DATA:  Central line plate basement. EXAM: PORTABLE CHEST 1 VIEW COMPARISON:  Chest x-ray dated 04/21/2018. FINDINGS: Heart size and mediastinal contours are stable. Lungs are clear. No pleural effusion or pneumothorax seen. RIGHT IJ central line in place with tip adequately positioned at the level of the mid SVC. IMPRESSION: 1. RIGHT IJ central line now in place with tip adequately positioned at the level of the mid SVC. 2. No pneumothorax. Electronically Signed   By: Franki Cabot M.D.   On: 04/22/2018 01:11   Dg Chest Portable 1 View  Result Date: 04/21/2018 CLINICAL DATA:  Fever with low blood pressure and tachycardia. History of bladder cancer and atrial fibrillation. EXAM: PORTABLE CHEST 1 VIEW COMPARISON:  Radiographs 03/05/2018 and 11/30/2017. FINDINGS: 1555  hours. Two views were obtained. The heart size and mediastinal contours are stable. There is mild biapical scarring. The lungs are otherwise clear. There is no pleural effusion or pneumothorax. Slight tracheal deviation to the right appears stable. No acute osseous findings are seen. Multiple telemetry leads overlie the chest. IMPRESSION: No active cardiopulmonary process. Electronically Signed   By: Richardean Sale M.D.   On: 04/21/2018 16:14   Ct Angio Chest/abd/pel For Dissection W And/or Wo Contrast  Result Date: 04/21/2018 CLINICAL DATA:  Generalized abdominal pain with vomiting. Recent ureteral stent removal. Aortic dissection suspected. EXAM: CT ANGIOGRAPHY CHEST, ABDOMEN AND PELVIS TECHNIQUE: Multidetector CT imaging through the chest, abdomen and pelvis was performed using the standard protocol during  bolus administration of intravenous contrast. Multiplanar reconstructed images and MIPs were obtained and reviewed to evaluate the vascular anatomy. CONTRAST:  151mL ISOVUE-370 IOPAMIDOL (ISOVUE-370) INJECTION 76% COMPARISON:  Chest radiographs 04/21/2018. Abdominopelvic CT 02/08/2018. FINDINGS: CTA CHEST FINDINGS Cardiovascular: Pre contrast images demonstrate minimal atherosclerosis of the aorta and great vessels. There are calcifications of the aortic valve. Following contrast, the aorta opacifies normally. There is no evidence of aortic aneurysm or dissection. The pulmonary arteries are well opacified with contrast. There is no evidence of acute pulmonary embolism. The heart size is normal. There is no pericardial effusion. Mediastinum/Nodes: There are small right hilar lymph nodes. There are no enlarged mediastinal or axillary lymph nodes. The thyroid gland, trachea and esophagus demonstrate no significant findings. Lungs/Pleura: There is no pleural effusion or pneumothorax. There is mild emphysema with mild biapical and bibasilar scarring. There are scattered small pulmonary nodules, including a  subpleural nodule in the right lower lobe measuring 5 mm on image 80/7 which is unchanged from previous abdominal CT. In the lingula, there is a 4 mm nodule on image 52/7. Musculoskeletal/Chest wall: There is no chest wall mass or suspicious osseous finding. Review of the MIP images confirms the above findings. CTA ABDOMEN AND PELVIS FINDINGS VASCULAR Aorta: Minimal atherosclerosis.  No aneurysm or dissection. Celiac: Mild calcification at the ostium.  No occlusion or aneurysm. SMA: Normal. Renals: Normal. IMA: Normal Inflow: Minimal iliac atherosclerosis without aneurysm or stenosis. Veins: Limited opacification.  No abnormalities identified. Review of the MIP images confirms the above findings. NON-VASCULAR Hepatobiliary: The pre contrast images demonstrate decreased hepatic density consistent with steatosis. Following contrast. No focal lesion or abnormal enhancement demonstrated. No evidence of gallstones, gallbladder wall thickening or biliary dilatation. Pancreas: Unremarkable. No pancreatic ductal dilatation or surrounding inflammatory changes. Spleen: Normal in size without focal abnormality. Adrenals/Urinary Tract: Both adrenal glands appear normal. The right kidney appears stable with a nonobstructing 9 mm calculus in its lower pole on image 150/6. There is an adjacent simple cyst. There is no right-sided hydronephrosis or ureteral calculus. No focal left renal abnormality is identified. There is new perinephric and periureteral soft tissue stranding on the left associated with persistent mild dilatation of the left ureter. Left ureteral stent seen on recent radiographs has been removed. There is persistent ureteral wall thickening and hyperenhancement. This protocol does not include delayed post-contrast images through the ureters. Foley catheter is in place. The bladder is incompletely distended with mild wall thickening. No definite stone fragments in either ureter or the bladder. Stomach/Bowel: No  evidence of bowel wall thickening, distention or surrounding inflammatory change. The appendix appears normal. Lymphatic: There are stable small lymph nodes within the porta hepatis. Reproductive: Fiducial markers again noted in the prostate gland. No abnormality of the seminal vesicle seen. Other: Stable mild fat in both inguinal canals. Musculoskeletal: No acute or significant osseous findings. Mild lumbar spondylosis. Old rib fractures on the left. Review of the MIP images confirms the above findings. IMPRESSION: 1. No evidence of aortic dissection, aneurysm or large vessel occlusion. 2. Persistent asymmetric left ureteral dilatation and wall thickening following ureteral stent removal. No obstructing calculus identified. This protocol does not include delayed imaging through the ureters, but no high-grade ureteral obstruction or focal urothelial lesion identified. Urology follow-up recommended. 3. Mild Aortic Atherosclerosis (ICD10-I70.0). Calcifications of the aortic valve. 4.  Emphysema (ICD10-J43.9). 5. Scattered small pulmonary nodules, nonspecific. Those at the lung bases appear chronic and unchanged. No routine follow-up is necessary in a low risk patient per  consensus guidelines. Electronically Signed   By: Richardean Sale M.D.   On: 04/21/2018 18:53    Time Spent in minutes  30   Lala Lund M.D on 04/24/2018 at 10:12 AM  To page go to [email or web address] - password [email or web address]

## 2018-04-24 NOTE — Progress Notes (Signed)
Urology Progress Note   3 Days Post-Op  Subjective/ Interval events: Reports pain in his arms and shoulders bilaterally. Has not been ambulating since OR. Denies abdominal pain. BM yesterday, passing flatus. BP remains stable off pressors, transferred to stepdown yesterday.   Objective: Vital signs in last 24 hours: Temp:  [97.6 F (36.4 C)-98.9 F (37.2 C)] 97.6 F (36.4 C) (11/25 0115) Pulse Rate:  [55-98] 55 (11/25 0115) Resp:  [13-20] 17 (11/24 2055) BP: (91-128)/(55-88) 91/55 (11/25 0115) SpO2:  [90 %-97 %] 90 % (11/25 0115) Arterial Line BP: (139)/(75) 139/75 (11/24 0600) Weight:  [97.5 kg] 97.5 kg (11/24 1714)  Intake/Output from previous day: 11/24 0701 - 11/25 0700 In: 3333.6 [P.O.:500; I.V.:139.7; IV Piggyback:393.9] Out: 0973 [Urine:4175] Intake/Output this shift: Total I/O In: 2300 [Other:2300] Out: -   Physical Exam:  General: Alert and oriented CV: Regular rate Lungs: NWOB Abdomen: Soft, NT, ND GU: no CVA tenderness Ext: NT, No erythema  Lab Results: Recent Labs    04/22/18 0345 04/23/18 0408 04/24/18 0407  HGB 11.5* 11.4* 12.6*  HCT 36.4* 35.0* 38.2*   BMET Recent Labs    04/23/18 0408 04/24/18 0407  NA 136 140  K 3.9 3.6  CL 108 109  CO2 22 26  GLUCOSE 101* 117*  BUN 9 7*  CREATININE 0.97 1.00  CALCIUM 8.3* 8.6*     Studies/Results: No results found.  Assessment/Plan:  67 y.o. male s/p left ureteral stent placement for ureteral obstruction in setting of likely urosepsis. Hemodynamics improving with NE off this AM and stable blood pressures.   - Continue empiric broad spectrum antibiotics. Follow-up urine and blood cultures and adjust antibiotics appropriately. Recommend a two week course of culture-specific antibiotics.  - Urine culture from urology clinic on 04/21/18 showed no growth.  - Discontinue foley catheter today. Perform trial of void.  - Urology will arrange outpatient follow-up in 2-3 weeks to discuss of plan for left  ureteral stent. Plan for stent to remain in place at time of discharge.    Thank you for this consult.    LOS: 3 days   Dorothey Baseman 04/24/2018, 5:59 AM

## 2018-04-24 NOTE — Care Management Important Message (Signed)
Important Message  Patient Details  Name: Marvin Gill MRN: [id number] Date of Birth: 10-27-1950   Medicare Important Message Given:  Yes    Kashlynn Kundert Montine Circle 04/24/2018, 4:23 PM

## 2018-04-24 NOTE — Progress Notes (Signed)
Pt HR 56 Stopped Cardizem gtt Paged Candiss Norse, MD to notify Awaiting new orders

## 2018-04-24 NOTE — Discharge Instructions (Addendum)
Follow with Primary MD Lavone Orn, MD in 7 days   Get CBC, CMP  checked  by Primary MD in 5-7 days   Activity: As tolerated with Full fall precautions use walker/cane & assistance as needed  Disposition Home   Diet: Heart Healthy    Special Instructions: If you have smoked or chewed Tobacco  in the last 2 yrs please stop smoking, stop any regular Alcohol  and or any Recreational drug use.  On your next visit with your primary care physician please Get Medicines reviewed and adjusted.  Please request your Prim.MD to go over all Hospital Tests and Procedure/Radiological results at the follow up, please get all Hospital records sent to your Prim MD by signing hospital release before you go home.  If you experience worsening of your admission symptoms, develop shortness of breath, life threatening emergency, suicidal or homicidal thoughts you must seek medical attention immediately by calling 911 or calling your MD immediately  if symptoms less severe.  You Must read complete instructions/literature along with all the possible adverse reactions/side effects for all the Medicines you take and that have been prescribed to you. Take any new Medicines after you have completely understood and accpet all the possible adverse reactions/side effects.        Information on my medicine - ELIQUIS (apixaban)  Why was Eliquis prescribed for you? Eliquis was prescribed for you to reduce the risk of a blood clot forming that can cause a stroke if you have a medical condition called atrial fibrillation (a type of irregular heartbeat).  What do You need to know about Eliquis ? Take your Eliquis TWICE DAILY - one tablet in the morning and one tablet in the evening with or without food. If you have difficulty swallowing the tablet whole please discuss with your pharmacist how to take the medication safely.  Take Eliquis exactly as prescribed by your doctor and DO NOT stop taking Eliquis without  talking to the doctor who prescribed the medication.  Stopping may increase your risk of developing a stroke.  Refill your prescription before you run out.  After discharge, you should have regular check-up appointments with your healthcare provider that is prescribing your Eliquis.  In the future your dose may need to be changed if your kidney function or weight changes by a significant amount or as you get older.  What do you do if you miss a dose? If you miss a dose, take it as soon as you remember on the same day and resume taking twice daily.  Do not take more than one dose of ELIQUIS at the same time to make up a missed dose.  Important Safety Information A possible side effect of Eliquis is bleeding. You should call your healthcare provider right away if you experience any of the following: ? Bleeding from an injury or your nose that does not stop. ? Unusual colored urine (red or dark brown) or unusual colored stools (red or black). ? Unusual bruising for unknown reasons. ? A serious fall or if you hit your head (even if there is no bleeding).  Some medicines may interact with Eliquis and might increase your risk of bleeding or clotting while on Eliquis. To help avoid this, consult your healthcare provider or pharmacist prior to using any new prescription or non-prescription medications, including herbals, vitamins, non-steroidal anti-inflammatory drugs (NSAIDs) and supplements.  This website has more information on Eliquis (apixaban): http://www.eliquis.com/eliquis/home

## 2018-04-25 DIAGNOSIS — Z452 Encounter for adjustment and management of vascular access device: Secondary | ICD-10-CM

## 2018-04-25 DIAGNOSIS — Z419 Encounter for procedure for purposes other than remedying health state, unspecified: Secondary | ICD-10-CM

## 2018-04-25 LAB — BASIC METABOLIC PANEL
Anion gap: 9 (ref 5–15)
BUN: 10 mg/dL (ref 8–23)
CALCIUM: 9 mg/dL (ref 8.9–10.3)
CHLORIDE: 108 mmol/L (ref 98–111)
CO2: 21 mmol/L — ABNORMAL LOW (ref 22–32)
CREATININE: 0.93 mg/dL (ref 0.61–1.24)
Glucose, Bld: 108 mg/dL — ABNORMAL HIGH (ref 70–99)
Potassium: 3.6 mmol/L (ref 3.5–5.1)
SODIUM: 138 mmol/L (ref 135–145)

## 2018-04-25 LAB — CBC WITH DIFFERENTIAL/PLATELET
Abs Immature Granulocytes: 0.03 10*3/uL (ref 0.00–0.07)
BASOS ABS: 0 10*3/uL (ref 0.0–0.1)
Basophils Relative: 0 %
EOS PCT: 4 %
Eosinophils Absolute: 0.2 10*3/uL (ref 0.0–0.5)
HCT: 41.2 % (ref 39.0–52.0)
HEMOGLOBIN: 13.3 g/dL (ref 13.0–17.0)
Immature Granulocytes: 1 %
LYMPHS PCT: 20 %
Lymphs Abs: 1.3 10*3/uL (ref 0.7–4.0)
MCH: 28.4 pg (ref 26.0–34.0)
MCHC: 32.3 g/dL (ref 30.0–36.0)
MCV: 88 fL (ref 80.0–100.0)
Monocytes Absolute: 0.5 10*3/uL (ref 0.1–1.0)
Monocytes Relative: 7 %
NRBC: 0 % (ref 0.0–0.2)
Neutro Abs: 4.6 10*3/uL (ref 1.7–7.7)
Neutrophils Relative %: 68 %
PLATELETS: 242 10*3/uL (ref 150–400)
RBC: 4.68 MIL/uL (ref 4.22–5.81)
RDW: 13.5 % (ref 11.5–15.5)
WBC: 6.6 10*3/uL (ref 4.0–10.5)

## 2018-04-25 LAB — PHOSPHORUS: Phosphorus: 3.4 mg/dL (ref 2.5–4.6)

## 2018-04-25 LAB — MAGNESIUM: Magnesium: 1.9 mg/dL (ref 1.7–2.4)

## 2018-04-25 MED ORDER — CEPHALEXIN 500 MG PO CAPS: 500.0000 mg | ORAL_CAPSULE | Freq: Three times a day (TID) | ORAL | 0 refills | Status: AC

## 2018-04-25 MED ORDER — CEPHALEXIN 500 MG PO CAPS
500.0000 mg | ORAL_CAPSULE | Freq: Three times a day (TID) | ORAL | Status: DC
  Administered 2018-04-25: 500 mg via ORAL
  Filled 2018-04-25: qty 1

## 2018-04-25 NOTE — Progress Notes (Signed)
Morning meds given to patient. Dc MAR updated.

## 2018-04-25 NOTE — Discharge Summary (Signed)
Marvin Gill TMA:[id number] DOB: 1951-03-17 DOA: 04/21/2018  PCP: Lavone Orn, MD  Admit date: 04/21/2018  Discharge date: 04/25/2018  Admitted From: Home   Disposition:  Home   Recommendations for Outpatient Follow-up:   Follow up with PCP in 1-2 weeks  PCP Please obtain BMP/CBC, 2 view CXR in 1week,  (see Discharge instructions)   PCP Please follow up on the following pending results:    Home Health: None   Equipment/Devices: None  Consultations: Urology Discharge Condition: Stable CODE STATUS: Full   Diet Recommendation: Heart Healthy     Chief Complaint  Patient presents with  . Abdominal Pain    N/V     Brief history of present illness from the day of admission and additional interim summary     67 year old male with PMH as below, which is significant for atrial fibrillation on eliquis, AAA, urothelial carcinoma, Fibromyalgia, and CAD. He was recently diagnosed with both urothelial carcinoma and atrial fibrillaion. AF treated with diltiazem and eliquis. He has been followed by urology and underwent cystoscopy with left ureteroscopy, holmium laser ablation of the left distal ureteral tumor, bladder biopsy, and left JJ stent placement on 04/07/18.   He had stent removed 11/22 as an outpatient procedure. Shortly after removal he returned home and attempted to mow the yard, but developed severe abdominal pain. Also developed leg pain and chills he was diagnosed with sepsis and admitted by critical care.  Urology was consulted another stent reinsertion on 04/21/2018, he required IV antibiotics and Levophed, he was stabilized and transferred to hospitalist service on 04/24/2018.Marland Kitchen                                                                 Hospital Course    1.  Septic shock likely caused by  gram-negative infection arising from left ureteric stent related infection present on admission.  He definitely had bacteremia clinically, very high procalcitonin unfortunately his cultures have been negative, at this point his sepsis pathophysiology has resolved and I will transition him to Rocephin, MRSA PCR negative in the nares.  Stopped Vancomycin and Zosyn yesterday. Discussed with urologist Dr. Junious Silk on 04/24/2018, Foley [place] he was transitioned to Rocephin which he tolerated well for 24 hours he will now be transitioned to oral Keflex for 5 more days and discharged home with outpatient urology follow-up.  2.  Chronic atrial fibrillation and RVR.  Mali vas 2 score of at least 3.  Recent echo noted, RVR likely due to sepsis, currently in rate control, continue oral Cardizem and Eliquis.  EKG when in sinus rhythm is unremarkable.  3.  Hypothyroidism.  Continue home dose Synthroid.  4.  GERD. PPI.    Discharge diagnosis     Active Problems:   Septic shock ([place])  Discharge instructions    Discharge Instructions    Diet - low sodium heart healthy   Complete by:  As directed    Discharge instructions   Complete by:  As directed    Follow with Primary MD Lavone Orn, MD in 7 days   Get CBC, CMP  checked  by Primary MD in 5-7 days   Activity: As tolerated with Full fall precautions use walker/cane & assistance as needed  Disposition Home   Diet: Heart Healthy    Special Instructions: If you have smoked or chewed Tobacco  in the last 2 yrs please stop smoking, stop any regular Alcohol  and or any Recreational drug use.  On your next visit with your primary care physician please Get Medicines reviewed and adjusted.  Please request your Prim.MD to go over all Hospital Tests and Procedure/Radiological results at the follow up, please get all Hospital records sent to your Prim MD by signing hospital release before you go home.  If you experience worsening of your  admission symptoms, develop shortness of breath, life threatening emergency, suicidal or homicidal thoughts you must seek medical attention immediately by calling 911 or calling your MD immediately  if symptoms less severe.  You Must read complete instructions/literature along with all the possible adverse reactions/side effects for all the Medicines you take and that have been prescribed to you. Take any new Medicines after you have completely understood and accpet all the possible adverse reactions/side effects.   Increase activity slowly   Complete by:  As directed       Discharge Medications   Allergies as of 04/25/2018      Reactions   Codeine Nausea And Vomiting   "made my head feel funny too"      Medication List    TAKE these medications   ALPRAZolam 1 MG tablet Commonly known as:  XANAX Take 1 mg by mouth 2 (two) times daily.   apixaban 5 MG Tabs tablet Commonly known as:  ELIQUIS Take 1 tablet (5 mg total) by mouth 2 (two) times daily.   cephALEXin 500 MG capsule Commonly known as:  KEFLEX Take 1 capsule (500 mg total) by mouth 3 (three) times daily for 5 days.   diltiazem 180 MG 24 hr capsule Commonly known as:  CARDIZEM CD Take 1 capsule (180 mg total) by mouth daily.   esomeprazole 40 MG capsule Commonly known as:  NEXIUM Take 40 mg by mouth every morning.   gabapentin 800 MG tablet Commonly known as:  NEURONTIN Take 800 mg by mouth 3 (three) times daily.   HYDROcodone-acetaminophen 5-325 MG tablet Commonly known as:  NORCO/VICODIN Take 1 tablet by mouth every 4 (four) hours as needed for moderate pain.   levothyroxine 50 MCG tablet Commonly known as:  SYNTHROID, LEVOTHROID Take 1 tablet (50 mcg total) by mouth daily before breakfast.   lip balm ointment Apply topically as needed for lip care.   ondansetron 4 MG tablet Commonly known as:  ZOFRAN Take 1 tablet (4 mg total) by mouth daily as needed for nausea or vomiting.   phenazopyridine 200 MG  tablet Commonly known as:  PYRIDIUM Take 1 tablet (200 mg total) by mouth 3 (three) times daily as needed (for pain with urination).   pravastatin 40 MG tablet Commonly known as:  PRAVACHOL Take 40 mg by mouth every morning.   tamsulosin 0.4 MG Caps capsule Commonly known as:  FLOMAX Take 0.4 mg by mouth every morning.   VALTREX 1000  MG tablet Generic drug:  valACYclovir Take 1,000 mg by mouth 2 (two) times daily as needed. X 7 DAYS, THEN 500 MG BID X 6 MONTHS   vitamin B-12 1000 MCG tablet Commonly known as:  CYANOCOBALAMIN Take 1,000 mcg by mouth daily.       Follow-up Information    Ceasar Mons, MD. Schedule an appointment as soon as possible for a visit in 1 week(s).   Specialty:  Urology Why:  Call our office to schedule a follow-up appointment in 2 weeks for stent removal. Contact information: [address] [place] [address] [phone]        Lavone Orn, MD. Schedule an appointment as soon as possible for a visit in 1 week(s).   Specialty:  Internal Medicine Contact information: [address] [place] [address] [phone]        Fay Records, MD. Schedule an appointment as soon as possible for a visit in 2 week(s).   Specialty:  Cardiology Contact information: [place] [address] [phone]           Major procedures and Radiology Reports - PLEASE review detailed and final reports thoroughly  -      R IJ 04/21/18 will be removed 04/24/2018  Urology 11/22 - Cystoscopy and removal of old left ureteric stent and replacement/insertion of left ureteral stent Surgeon: Dr. Nicki Reaper MacDiarmid  CT -  1. No evidence of aortic dissection, aneurysm or large vessel occlusion. 2. Persistent asymmetric left ureteral dilatation and wall thickening following ureteral stent removal. No obstructing calculus identified. This protocol does not include delayed imaging through the  ureters, but no high-grade ureteral obstruction or focal urothelial lesion identified. Urology follow-up recommended. 3. Mild Aortic Atherosclerosis (ICD10-I70.0). Calcifications of the aortic valve. 4.  Emphysema (ICD10-J43.9). 5. Scattered small pulmonary nodules, nonspecific. Those at the lung bases appear chronic and unchanged. No routine follow-up is necessary in a low risk patient per consensus guidelines.  TTE 02/2018 - noted EF of 55 to 60%, no significant wall motion abnormality.   Dg Cystogram  Result Date: 04/22/2018 CLINICAL DATA:  Recurrent left hydroureteronephrosis with distal ureteral obstruction EXAM: CYSTOGRAM - 3+ VIEW CONTRAST:  See operative note FLUOROSCOPY TIME:  Fluoroscopy Time:  28 seconds Radiation Exposure Index (if provided by the fluoroscopic device): Not available Number of Acquired Spot Images: 4 COMPARISON:  None. FINDINGS: Spot fluoroscopic views during the operative procedure demonstrate left retrograde pyelogram. Mild left hydroureteronephrosis. Retrograde wire access performed into the left upper pole calyx for placement of a left ureteral stent successfully. IMPRESSION: Successful insertion of a left ureteral stent. Electronically Signed   By: Jerilynn Mages.  Shick M.D.   On: 04/22/2018 08:20   Dg Chest Port 1 View  Result Date: 04/22/2018 CLINICAL DATA:  Central line plate basement. EXAM: PORTABLE CHEST 1 VIEW COMPARISON:  Chest x-ray dated 04/21/2018. FINDINGS: Heart size and mediastinal contours are stable. Lungs are clear. No pleural effusion or pneumothorax seen. RIGHT IJ central line in place with tip adequately positioned at the level of the mid SVC. IMPRESSION: 1. RIGHT IJ central line now in place with tip adequately positioned at the level of the mid SVC. 2. No pneumothorax. Electronically Signed   By: Franki Cabot M.D.   On: 04/22/2018 01:11   Dg Chest Portable 1 View  Result Date: 04/21/2018 CLINICAL DATA:  Fever with low blood pressure and tachycardia.  History of bladder cancer and atrial fibrillation.  EXAM: PORTABLE CHEST 1 VIEW COMPARISON:  Radiographs 03/05/2018 and 11/30/2017. FINDINGS: 1555 hours. Two views were obtained. The heart size and mediastinal contours are stable. There is mild biapical scarring. The lungs are otherwise clear. There is no pleural effusion or pneumothorax. Slight tracheal deviation to the right appears stable. No acute osseous findings are seen. Multiple telemetry leads overlie the chest. IMPRESSION: No active cardiopulmonary process. Electronically Signed   By: Richardean Sale M.D.   On: 04/21/2018 16:14   Ct Angio Chest/abd/pel For Dissection W And/or Wo Contrast  Result Date: 04/21/2018 CLINICAL DATA:  Generalized abdominal pain with vomiting. Recent ureteral stent removal. Aortic dissection suspected. EXAM: CT ANGIOGRAPHY CHEST, ABDOMEN AND PELVIS TECHNIQUE: Multidetector CT imaging through the chest, abdomen and pelvis was performed using the standard protocol during bolus administration of intravenous contrast. Multiplanar reconstructed images and MIPs were obtained and reviewed to evaluate the vascular anatomy. CONTRAST:  135mL ISOVUE-370 IOPAMIDOL (ISOVUE-370) INJECTION 76% COMPARISON:  Chest radiographs 04/21/2018. Abdominopelvic CT 02/08/2018. FINDINGS: CTA CHEST FINDINGS Cardiovascular: Pre contrast images demonstrate minimal atherosclerosis of the aorta and great vessels. There are calcifications of the aortic valve. Following contrast, the aorta opacifies normally. There is no evidence of aortic aneurysm or dissection. The pulmonary arteries are well opacified with contrast. There is no evidence of acute pulmonary embolism. The heart size is normal. There is no pericardial effusion. Mediastinum/Nodes: There are small right hilar lymph nodes. There are no enlarged mediastinal or axillary lymph nodes. The thyroid gland, trachea and esophagus demonstrate no significant findings. Lungs/Pleura: There is no pleural  effusion or pneumothorax. There is mild emphysema with mild biapical and bibasilar scarring. There are scattered small pulmonary nodules, including a subpleural nodule in the right lower lobe measuring 5 mm on image 80/7 which is unchanged from previous abdominal CT. In the lingula, there is a 4 mm nodule on image 52/7. Musculoskeletal/Chest wall: There is no chest wall mass or suspicious osseous finding. Review of the MIP images confirms the above findings. CTA ABDOMEN AND PELVIS FINDINGS VASCULAR Aorta: Minimal atherosclerosis.  No aneurysm or dissection. Celiac: Mild calcification at the ostium.  No occlusion or aneurysm. SMA: Normal. Renals: Normal. IMA: Normal Inflow: Minimal iliac atherosclerosis without aneurysm or stenosis. Veins: Limited opacification.  No abnormalities identified. Review of the MIP images confirms the above findings. NON-VASCULAR Hepatobiliary: The pre contrast images demonstrate decreased hepatic density consistent with steatosis. Following contrast. No focal lesion or abnormal enhancement demonstrated. No evidence of gallstones, gallbladder wall thickening or biliary dilatation. Pancreas: Unremarkable. No pancreatic ductal dilatation or surrounding inflammatory changes. Spleen: Normal in size without focal abnormality. Adrenals/Urinary Tract: Both adrenal glands appear normal. The right kidney appears stable with a nonobstructing 9 mm calculus in its lower pole on image 150/6. There is an adjacent simple cyst. There is no right-sided hydronephrosis or ureteral calculus. No focal left renal abnormality is identified. There is new perinephric and periureteral soft tissue stranding on the left associated with persistent mild dilatation of the left ureter. Left ureteral stent seen on recent radiographs has been removed. There is persistent ureteral wall thickening and hyperenhancement. This protocol does not include delayed post-contrast images through the ureters. Foley catheter is in  place. The bladder is incompletely distended with mild wall thickening. No definite stone fragments in either ureter or the bladder. Stomach/Bowel: No evidence of bowel wall thickening, distention or surrounding inflammatory change. The appendix appears normal. Lymphatic: There are stable small lymph nodes within the porta hepatis. Reproductive: Fiducial markers again noted  in the prostate gland. No abnormality of the seminal vesicle seen. Other: Stable mild fat in both inguinal canals. Musculoskeletal: No acute or significant osseous findings. Mild lumbar spondylosis. Old rib fractures on the left. Review of the MIP images confirms the above findings. IMPRESSION: 1. No evidence of aortic dissection, aneurysm or large vessel occlusion. 2. Persistent asymmetric left ureteral dilatation and wall thickening following ureteral stent removal. No obstructing calculus identified. This protocol does not include delayed imaging through the ureters, but no high-grade ureteral obstruction or focal urothelial lesion identified. Urology follow-up recommended. 3. Mild Aortic Atherosclerosis (ICD10-I70.0). Calcifications of the aortic valve. 4.  Emphysema (ICD10-J43.9). 5. Scattered small pulmonary nodules, nonspecific. Those at the lung bases appear chronic and unchanged. No routine follow-up is necessary in a low risk patient per consensus guidelines. Electronically Signed   By: Richardean Sale M.D.   On: 04/21/2018 18:53    Micro Results     Recent Results (from the past 240 hour(s))  Urine culture     Status: None   Collection Time: 04/16/18  2:45 PM  Result Value Ref Range Status   Specimen Description   Final    URINE, CATHETERIZED Performed at [facility] [address] [place], [place] [address]    Special Requests   Final    Normal Performed at [facility], [place] [address] [place], [place] [address]    Culture   Final    NO GROWTH Performed at [facility], [place] [address], [place], [place] [address]    Report Status 04/18/2018 FINAL  Final  Blood culture (routine x 2)     Status: None (Preliminary result)   Collection Time: 04/21/18  3:49 PM  Result Value Ref Range Status   Specimen Description BLOOD LEFT ANTECUBITAL  Final   Special Requests   Final    BOTTLES DRAWN AEROBIC AND ANAEROBIC Blood Culture adequate volume   Culture   Final    NO GROWTH 4 DAYS Performed at [facility], [place] [address] [place], [place] [address]    Report Status PENDING  Incomplete  Urine culture     Status: None   Collection Time: 04/21/18  3:50 PM  Result Value Ref Range Status   Specimen Description URINE, RANDOM  Final   Special Requests NONE  Final   Culture   Final    NO GROWTH Performed at [facility], [address], [place], [place] [address]    Report Status 04/23/2018 FINAL  Final  MRSA PCR Screening     Status: None   Collection Time: 04/22/18 12:52 AM  Result Value Ref Range Status   MRSA by PCR NEGATIVE NEGATIVE Final    Comment:        The GeneXpert MRSA Assay (FDA approved for NASAL specimens only), is one component of a comprehensive MRSA colonization surveillance program. It is not intended to diagnose MRSA infection nor to guide or monitor treatment for MRSA infections. Performed at [facility], [place] [address], [place],  [address]     Today   Subjective    Deran Barro today has no headache,no chest abdominal pain,no new weakness tingling or numbness, feels much better wants to go home today.     Objective   Blood pressure 120/78, pulse 64, temperature 97.8 F (36.6 C), temperature source Oral, resp. rate 18, height 6\' 6"  (1.981 m), weight 97.5 kg, SpO2 96 %.   Intake/Output Summary (  Last 24 hours) at 04/25/2018 0849 Last data filed at 04/24/2018 1300 Gross per 24 hour  Intake 100.09 ml  Output 1200 ml  Net -1099.91 ml    Exam Awake Alert, Oriented x 3, No new F.N  deficits, Normal affect [place].AT,PERRAL Supple Neck,No JVD, No cervical lymphadenopathy appriciated.  Symmetrical Chest wall movement, Good air movement bilaterally, CTAB RRR,No Gallops,Rubs or new Murmurs, No Parasternal Heave +ve B.Sounds, Abd Soft, Non tender, No organomegaly appriciated, No rebound -guarding or rigidity. No Cyanosis, Clubbing or edema, No new Rash or bruise   Data Review   CBC w Diff:  Lab Results  Component Value Date   WBC 6.6 04/25/2018   HGB 13.3 04/25/2018   HCT 41.2 04/25/2018   PLT 242 04/25/2018   LYMPHOPCT 20 04/25/2018   MONOPCT 7 04/25/2018   EOSPCT 4 04/25/2018   BASOPCT 0 04/25/2018    CMP:  Lab Results  Component Value Date   NA 138 04/25/2018   K 3.6 04/25/2018   CL 108 04/25/2018   CO2 21 (L) 04/25/2018   BUN 10 04/25/2018   CREATININE 0.93 04/25/2018   PROT 6.9 04/21/2018   ALBUMIN 3.8 04/21/2018   BILITOT 1.0 04/21/2018   ALKPHOS 78 04/21/2018   AST 20 04/21/2018   ALT 20 04/21/2018  .   Total Time in preparing paper work, data evaluation and todays exam - 48 minutes  Lala Lund M.D on 04/25/2018 at 8:49 AM  [facility]   Office  [phone]

## 2018-04-25 NOTE — Progress Notes (Signed)
Marvin Gill to be D/C'd  To home per MD order.  Discussed with the patient and all questions fully answered.  VSS, Skin clean, dry and intact without evidence of skin break down, no evidence of skin tears noted. IV catheter discontinued intact. Site without signs and symptoms of complications. Dressing and pressure applied.  An After Visit Summary was printed and given to the patient.   D/c education completed with patient/family including follow up instructions, medication list, d/c activities limitations if indicated, with other d/c instructions as indicated by MD - patient able to verbalize understanding, all questions fully answered.   Patient instructed to return to ED, call 911, or call MD for any changes in condition.   Patient escorted via [place], and D/C home via private auto.  [name] 04/25/2018 9:31 AM

## 2018-04-25 NOTE — Progress Notes (Signed)
Physical Therapy Treatment Patient Details Name: Marvin Gill MRN: [id number] DOB: 1950/07/27 Today's Date: 04/25/2018    History of Present Illness Patient is a 67 y/o male status post ureteral stent removal presents later same day (11/22) with abdominal pain, fevers, chills. CT with ureteral edema and possible obstruction. Cystoscopy and insertion of left ureteral stent 04/21/18.  PMH significant for atrial fibrillation on eliquis, AAA, urothelial carcinoma, Fibromyalgia, and CAD.    PT Comments    Pt was in chair with nurse present upon arrival. Pt seemed very motivated and stated he was ready to return home today. Pt participated in standing LE exercises, gait and stair training this session. Pt required close supervision for gait and stair training for safety this session. Pt has made progress toward stated goals and would benefit from continued PT in order to maximize functional independence. Pt remains appropriate for discharge to home based on current functional status and help from family at home.   Follow Up Recommendations  No PT follow up     Equipment Recommendations  None recommended by PT    Recommendations for Other Services       Precautions / Restrictions Precautions Precautions: Fall Restrictions Weight Bearing Restrictions: No    Mobility  Bed Mobility               General bed mobility comments: Pt in chair upon arrival  Transfers Overall transfer level: Modified independent Equipment used: None Transfers: Sit to/from Stand Sit to Stand: Modified independent (Device/Increase time)            Ambulation/Gait Ambulation/Gait assistance: Supervision Gait Distance (Feet): 120 Feet Assistive device: None Gait Pattern/deviations: Step-through pattern;Decreased stride length;Drifts right/left     General Gait Details: slow pace of gait; very cautious. Cues on maintaining upright posture   Stairs Stairs: Yes Stairs assistance:  Supervision Stair Management: One rail Right Number of Stairs: (flight) General stair comments: Pt ascended and descended stairs with alternating pattern and supervision for safety.   Wheelchair Mobility    Modified Rankin (Stroke Patients Only)       Balance Overall balance assessment: Needs assistance   Sitting balance-Leahy Scale: Good       Standing balance-Leahy Scale: Good Standing balance comment: Pt requries supervison for safety                            Cognition Arousal/Alertness: Awake/alert Behavior During Therapy: WFL for tasks assessed/performed Overall Cognitive Status: Within Functional Limits for tasks assessed                                        Exercises Total Joint Exercises Ankle Circles/Pumps: AROM;10 reps;Standing;Right;Left Hip ABduction/ADduction: AROM;Standing;10 reps;Right;Left Marching in Standing: AROM;10 reps;Right;Left;Standing    General Comments General comments (skin integrity, edema, etc.): very motivated to participate      Pertinent Vitals/Pain Pain Assessment: No/denies pain    Home Living                      Prior Function            PT Goals (current goals can now be found in the care plan section) Acute Rehab PT Goals Patient Stated Goal: return home PT Goal Formulation: With patient Time For Goal Achievement: 05/01/18 Potential to Achieve Goals: Good Progress towards PT goals: Progressing toward  goals    Frequency    Min 4X/week      PT Plan Current plan remains appropriate    Co-evaluation              AM-PAC PT "6 Clicks" Mobility   Outcome Measure  Help needed turning from your back to your side while in a flat bed without using bedrails?: None Help needed moving from lying on your back to sitting on the side of a flat bed without using bedrails?: A Little Help needed moving to and from a bed to a chair (including a wheelchair)?: A Little Help needed  standing up from a chair using your arms (e.g., wheelchair or bedside chair)?: A Little Help needed to walk in hospital room?: A Little Help needed climbing 3-5 steps with a railing? : A Little 6 Click Score: 19    End of Session Equipment Utilized During Treatment: Gait belt Activity Tolerance: Patient tolerated treatment well Patient left: in bed;with call bell/phone within reach;with nursing/sitter in room Nurse Communication: Mobility status PT Visit Diagnosis: Unsteadiness on feet (R26.81);Muscle weakness (generalized) (M62.81)     Time: 3903-0092 PT Time Calculation (min) (ACUTE ONLY): 10 min  Charges:  $Gait Training: 8-22 mins                     [address] [place] [address] [place] 04/25/2018, 11:14 AM

## 2018-04-26 LAB — CULTURE, BLOOD (ROUTINE X 2)
Culture: NO GROWTH
Special Requests: ADEQUATE

## 2018-06-26 ENCOUNTER — Ambulatory Visit: Payer: Medicare Other | Admitting: Internal Medicine

## 2018-06-26 ENCOUNTER — Encounter: Payer: Self-pay | Admitting: Internal Medicine

## 2018-06-26 VITALS — BP 122/80 | HR 66 | Ht 78.0 in | Wt 219.8 lb

## 2018-06-26 DIAGNOSIS — I480 Paroxysmal atrial fibrillation: Secondary | ICD-10-CM | POA: Diagnosis not present

## 2018-06-26 DIAGNOSIS — E782 Mixed hyperlipidemia: Secondary | ICD-10-CM

## 2018-06-26 DIAGNOSIS — I2510 Atherosclerotic heart disease of native coronary artery without angina pectoris: Secondary | ICD-10-CM

## 2018-06-26 NOTE — Progress Notes (Signed)
Cardiology Office Note   Date:  06/26/2018   ID:  Marvin Gill, Marvin Gill 1950/09/30, MRN [id number]  PCP:  Lavone Orn, MD  Cardiologist:   Dorris Carnes, MD   F/U of CAD    History of Present Illness: Marvin Gill is a 68 y.o. male with a history of CAD (s/p PTCA of LAD in 2000; last cath in 2003 normal coronary arteires , patent PTCA site), AAA, GERD, atrial fibrillation (in setting o sepsis), urosepsis.  Echo LVEF 55 to 60%^    Converted to SR   He was seen by B Bhagat in October 2019  Since hospital he  has not had heart racing    Breathing is good     No CP    Followed by Dr Gilford Rile by urology for urothelial cancer [facility])   Denies hematuria      Current Meds  Medication Sig  . ALPRAZolam (XANAX) 1 MG tablet Take 1 mg by mouth 2 (two) times daily.   Marland Kitchen apixaban (ELIQUIS) 5 MG TABS tablet Take 1 tablet (5 mg total) by mouth 2 (two) times daily.  Marland Kitchen diltiazem (CARDIZEM CD) 180 MG 24 hr capsule Take 1 capsule (180 mg total) by mouth daily.  Marland Kitchen esomeprazole (NEXIUM) 40 MG capsule Take 40 mg by mouth every morning.   . gabapentin (NEURONTIN) 800 MG tablet Take 800 mg by mouth 3 (three) times daily.   Marland Kitchen levothyroxine (SYNTHROID, LEVOTHROID) 50 MCG tablet Take 1 tablet (50 mcg total) by mouth daily before breakfast.  . pravastatin (PRAVACHOL) 40 MG tablet Take 40 mg by mouth every morning.   . tamsulosin (FLOMAX) 0.4 MG CAPS capsule Take 0.4 mg by mouth every morning.  . valACYclovir (VALTREX) 1000 MG tablet Take 1,000 mg by mouth 2 (two) times daily as needed. X 7 DAYS, THEN 500 MG BID X 6 MONTHS  . vitamin B-12 (CYANOCOBALAMIN) 1000 MCG tablet Take 1,000 mcg by mouth daily.     Allergies:   Codeine   Past Medical History:  Diagnosis Date  . AF (paroxysmal atrial fibrillation) ([place]) 03/05/2018   new dx in settin sepsis  . Aneurysm of abdominal aorta (HCC)    a. ultasound 08-04-16 --- 3.1 CM X 3.1 CM - sees Dr. Laurann Montana - F/u due 2021.  Marland Kitchen Anxiety   . Arthritis      back  . Benign localized prostatic hyperplasia with lower urinary tract symptoms (LUTS)   . Bladder cancer ([place])   . Coronary artery disease primary cardiologist-- dr Dorris Carnes   a. s/p PTCA to dLAD 2000. b. Normal coronaries in 2003, patent prior PTCA site.  . Fibromyalgia   . GERD (gastroesophageal reflux disease)   . History of kidney stones   . History of skin cancer   . Hypothyroidism   . PONV (postoperative nausea and vomiting)    NAUSEA ALL DAY AFTER 02-01-17 SURGERY, PT WANTS ANESTHESIA LIKE 06-23-15 SURGERY WITH DR  [name]  . S/P percutaneous transluminal coronary angioplasty    05-18-1999  to dLAD  . Wears glasses     Past Surgical History:  Procedure Laterality Date  . CARDIAC CATHETERIZATION  06-14-2001  dr Einar Gip   normal coronary arteries and patent previous dLAD   . CARDIOVASCULAR STRESS TEST  06/12/2008   Low risk nuclear study w/ no evidence ischemia/  normal LV funciton and wall motion , ef 60%  . COLONOSCOPY WITH PROPOFOL N/A 12/13/2013   Procedure: COLONOSCOPY WITH PROPOFOL;  Surgeon: Ursula Alert  Wynetta Emery, MD;  Location: [facility];  Service: Endoscopy;  Laterality: N/A;  . CORONARY ANGIOPLASTY  05-18-1999  dr Tami Ribas   PTCA balloon to dLAD 80% (single vessel disease),  ef 60%  . CYSTOSCOPY WITH FULGERATION N/A 08/15/2017   Procedure: CYSTOSCOP/ BLADDER BIOPSY/TURBT;  Surgeon: Nickie Retort, MD;  Location: [facility];  Service: Urology;  Laterality: N/A;  . CYSTOSCOPY WITH INSERTION OF UROLIFT N/A 06/23/2015   Procedure: CYSTOSCOPY WITH INSERTION OF UROLIFT;  Surgeon: Carolan Clines, MD;  Location: [facility];  Service: Urology;  Laterality: N/A;  . CYSTOSCOPY WITH STENT PLACEMENT Left 04/21/2018   Procedure: CYSTOSCOPY WITH STENT PLACEMENT;  Surgeon: Bjorn Loser, MD;  Location: [place];  Service: Urology;  Laterality: Left;  Emergent case  . CYSTOSCOPY WITH URETEROSCOPY AND STENT PLACEMENT Left 04/07/2018   Procedure: CYSTOSCOPY WITH  URETEROSCOPY AND STENT EXCHANGE/ BLADDER BIOPSY/LASER ABLATION OF URETERAL TUMOR;  Surgeon: Ceasar Mons, MD;  Location: [facility];  Service: Urology;  Laterality: Left;  ONLY NEEDS 60 MIN  . CYSTOSCOPY/URETEROSCOPY/HOLMIUM LASER/STENT PLACEMENT Bilateral 02/01/2017   Procedure: CYSTOSCOPY/URETEROSCOPY/HOLMIUM LASER/STENT PLACEMENT,STONE BASKETRY, BLADDER BIOPSY;  Surgeon: Nickie Retort, MD;  Location: [facility];  Service: Urology;  Laterality: Bilateral;  . ESOPHAGOGASTRODUODENOSCOPY  08-24-2006  . TRANSTHORACIC ECHOCARDIOGRAM  03/06/2018   mild LVH,  ef 55-60%,  pt in atrial fib. LV diastolic not evaluated/  mild LAE and RAE/  moderate AV calcification with sclerosis , no stenosis or regurg./  mild dilated aortic root, 7mm/  trival MR and TR  . TRANSURETHRAL INCISION OF PROSTATE N/A 04/01/2014   Procedure: TRANSURETHRAL INCISION OF THE PROSTATE (TUIP);  Surgeon: Ailene Rud, MD;  Location: [facility];  Service: Urology;  Laterality: N/A;     Social History:  The patient  reports that he quit smoking about 15 years ago. His smoking use included cigarettes. He has a 25.00 pack-year smoking history. He has never used smokeless tobacco. He reports that he does not drink alcohol or use drugs.   Family History:  The patient's family history includes Anuerysm in his mother.    ROS:  Please see the history of present illness. All other systems are reviewed and  Negative to the above problem except as noted.    PHYSICAL EXAM: VS:  BP 122/80   Pulse 66   Ht 6\' 6"  (1.981 m)   Wt 219 lb 12.8 oz (99.7 kg)   SpO2 96%   BMI 25.40 kg/m   GEN: Well nourished, well developed, in no acute distress  HEENT: normal  Neck: JVP is not elevated   NO carotid bruits, or masses Cardiac: RRR; no murmurs, rubs, or gallops,no edema  Respiratory:  clear to auscultation bilaterally, normal work of breathing GI: soft, nontender,  nondistended, + BS  No hepatomegaly  MS: no deformity Moving all extremities   Skin: warm and dry, no rash Neuro:  Strength and sensation are intact Psych: euthymic mood, full affect   EKG:  EKG is not ordered today.   Lipid Panel    Component Value Date/Time   CHOL  05/10/2007 0305    158        ATP III CLASSIFICATION:  <200     mg/dL   Desirable  200-239  mg/dL   Borderline High  >=240    mg/dL   High   TRIG 86 05/10/2007 0305   HDL 35 (L) 05/10/2007 0305   CHOLHDL 4.5 05/10/2007 0305   VLDL  17 05/10/2007 0305   LDLCALC (H) 05/10/2007 0305    106        Total Cholesterol/HDL:CHD Risk Coronary Heart Disease Risk Table                     Men   Women  1/2 Average Risk   3.4   3.3      Wt Readings from Last 3 Encounters:  06/26/18 219 lb 12.8 oz (99.7 kg)  04/23/18 215 lb (97.5 kg)  04/13/18 209 lb 10.5 oz (95.1 kg)      ASSESSMENT AND PLAN:   CAD   No symptoms of angina   Continue meds   2  PAF  Reg rehythm now   Keep on Eliqiuis and diltiazem     Follow   3  HL  COntniue statins   4  Renal  Getting  Treating by urol with chemo  F/U clnic in September       Current medicines are reviewed at length with the patient today.  The patient does not have concerns regarding medicines.  Signed, Dorris Carnes, MD  06/26/2018 4:30 PM    [address], [place], [place]  [address] Phone: [phone]; Fax: [phone]

## 2018-06-26 NOTE — Patient Instructions (Signed)
Medication Instructions:  No change  If you need a refill on your cardiac medications before your next appointment, please call your pharmacy.   Lab work: Lipids today If you have labs (blood work) drawn today and your tests are completely normal, you will receive your results only by: Marland Kitchen MyChart Message (if you have MyChart) OR . A paper copy in the mail If you have any lab test that is abnormal or we need to change your treatment, we will call you to review the results.  Testing/Procedures: none  Follow-Up: At [facility], you and your health needs are our priority.  As part of our continuing mission to provide you with exceptional heart care, we have created designated Provider Care Teams.  These Care Teams include your primary Cardiologist (physician) and Advanced Practice Providers (APPs -  Physician Assistants and Nurse Practitioners) who all work together to provide you with the care you need, when you need it. You will need a follow up appointment in:  8-9 months.  Please call our office 2 months in advance to schedule this appointment.  You may see Dorris Carnes, MD or one of the following Advanced Practice Providers on your designated Care Team: Richardson Dopp, PA-C [place], [place] . Daune Perch, NP  Any Other Special Instructions Will Be Listed Below (If Applicable).

## 2018-06-27 LAB — LIPID PANEL
CHOLESTEROL TOTAL: 176 mg/dL (ref 100–199)
Chol/HDL Ratio: 3.6 ratio (ref 0.0–5.0)
HDL: 49 mg/dL (ref >39)
LDL CALC: 83 mg/dL (ref 0–99)
Triglycerides: 221 mg/dL — ABNORMAL HIGH (ref 0–149)
VLDL CHOLESTEROL CAL: 44 mg/dL — AB (ref 5–40)

## 2018-06-28 ENCOUNTER — Other Ambulatory Visit: Payer: Self-pay | Admitting: *Deleted

## 2018-06-28 DIAGNOSIS — E785 Hyperlipidemia, unspecified: Secondary | ICD-10-CM

## 2018-06-28 MED ORDER — ROSUVASTATIN CALCIUM 20 MG PO TABS: 20.0000 mg | ORAL_TABLET | Freq: Every day | ORAL | 3 refills | Status: DC

## 2018-07-07 DIAGNOSIS — C662 Malignant neoplasm of left ureter: Secondary | ICD-10-CM | POA: Diagnosis not present

## 2018-07-07 DIAGNOSIS — Z5111 Encounter for antineoplastic chemotherapy: Secondary | ICD-10-CM | POA: Diagnosis not present

## 2018-07-13 DIAGNOSIS — Z5111 Encounter for antineoplastic chemotherapy: Secondary | ICD-10-CM | POA: Diagnosis not present

## 2018-07-13 DIAGNOSIS — C662 Malignant neoplasm of left ureter: Secondary | ICD-10-CM | POA: Diagnosis not present

## 2018-07-20 DIAGNOSIS — Z5111 Encounter for antineoplastic chemotherapy: Secondary | ICD-10-CM | POA: Diagnosis not present

## 2018-07-20 DIAGNOSIS — C662 Malignant neoplasm of left ureter: Secondary | ICD-10-CM | POA: Diagnosis not present

## 2018-07-27 DIAGNOSIS — Z5111 Encounter for antineoplastic chemotherapy: Secondary | ICD-10-CM | POA: Diagnosis not present

## 2018-07-27 DIAGNOSIS — C662 Malignant neoplasm of left ureter: Secondary | ICD-10-CM | POA: Diagnosis not present

## 2018-08-30 ENCOUNTER — Other Ambulatory Visit: Payer: Medicare Other

## 2018-09-06 ENCOUNTER — Telehealth: Payer: Self-pay | Admitting: Internal Medicine

## 2018-09-06 DIAGNOSIS — R42 Dizziness and giddiness: Secondary | ICD-10-CM

## 2018-09-06 NOTE — Telephone Encounter (Signed)
Pt had intermittent atrial fibrilllation while hospitalized at [facility] in October for kidney stone.  Not clear it was true "sepsis" diagnosis.  WIth hematuria stop I would set up for 2 wk patch  Zio patch to see if in and out (he was last fall and he did not sense)  Also Does he have BP cuff at home    He was dizzy at hosp last fall Make sure he stays adequately hydrated

## 2018-09-06 NOTE — Telephone Encounter (Signed)
  Marvin Gill is calling because he states his blood pressure has been running low. He is c/o a kidney stone and blood in his urine and his PCP has told him to stop his blood pressure medication but he is not sure if he should do that. He wanted to make an appt to see Dr Harrington Challenger and I explained we are doing virtual visits to which he stated he wants to see her in person. He would like a nurse to call him to discuss what is going on and his options for an appointment.

## 2018-09-06 NOTE — Telephone Encounter (Signed)
Patient seeing Dr. Gilford Rile [facility] urology). Has had hematuria for 2 months, on antibiotics for 6 weeks treating for infection, still with blood in urine, burining. Had CT yesterday and told no infection, has kidney stone.  Labs drawn yesterday.  I will try to get a copy of labs from urology. Was instructed to stop ELIQUIS.  Has not taken any since yesterday am.  BP running low at times - cannot check at home. At urology 89/79 "or something like that" - he gets lightheaded, especially with position changes, no falls  Asked him about diltiazem 180 mg -he said he hasn't started taking yet, dr Harrington Challenger had him on another pill and he was going to finish that one first--does not know the name, threw away the bottle because last few of the pills are in pillbox now.  I cannot see in chart where he had a different medication.   For BP - I told him I will call [facility] to try to find out what other medicine it could be.  I adv to drink plenty of fluids and change positions slowly. Adv I would review all of the above with Dr. Harrington Challenger. He said Dr. Gilford Rile was going to send a note to Dr. Harrington Challenger.

## 2018-09-07 ENCOUNTER — Telehealth: Payer: Self-pay | Admitting: *Deleted

## 2018-09-07 NOTE — Telephone Encounter (Signed)
Patient called to inform he is enrolled for a 14 day ZIO XT long term holter monitor to be mailed to his home. Instruction briefly reviewed as instruction included in kit.

## 2018-09-07 NOTE — Telephone Encounter (Signed)
Called patient to advise that Dr. Harrington Challenger has ordered a heart monitor for him and that someone from our office will call him back with instructions. Per request from Georga Hacking I asked the patient if he has contacted the pharmacy to determine the medication that she discussed with him on 5/8. Patient states he has not called them yet. I advised him to please call back with that information and he verbalized agreement and then hung up the phone.

## 2018-09-07 NOTE — Telephone Encounter (Signed)
Pt does not have a blood pressure cuff at home.  No way to check BP, HR. Order for Zio monitor (14 day) placed.

## 2018-09-12 ENCOUNTER — Ambulatory Visit (INDEPENDENT_AMBULATORY_CARE_PROVIDER_SITE_OTHER): Payer: Medicare Other

## 2018-09-12 DIAGNOSIS — R42 Dizziness and giddiness: Secondary | ICD-10-CM

## 2018-09-12 DIAGNOSIS — I480 Paroxysmal atrial fibrillation: Secondary | ICD-10-CM

## 2018-09-14 NOTE — Telephone Encounter (Signed)
Called medical records at [facility].  Spoke with Bethena Roys.  Asked that recent CBC be faxed to Dr. Harrington Challenger at [phone].  Its on its way.

## 2018-09-21 ENCOUNTER — Other Ambulatory Visit: Payer: Self-pay

## 2018-09-21 ENCOUNTER — Other Ambulatory Visit: Payer: Self-pay | Admitting: Internal Medicine

## 2018-09-21 DIAGNOSIS — R42 Dizziness and giddiness: Secondary | ICD-10-CM

## 2018-09-21 DIAGNOSIS — I480 Paroxysmal atrial fibrillation: Secondary | ICD-10-CM

## 2018-09-26 ENCOUNTER — Telehealth: Payer: Self-pay | Admitting: Internal Medicine

## 2018-09-26 NOTE — Telephone Encounter (Signed)
Patient is calling stating that he is returning a call from Dr Alan Ripper nurse. I am not sure what it is in regards to as he was not very pleasant or forthcoming. I told him I would have his call returned.

## 2018-09-26 NOTE — Telephone Encounter (Signed)
I spoke to the patient and shared the results from his monitor and gave him Dr Harrington Challenger' recommendations.  He would like a call from [facility] to discuss future appt.  He verbalized understanding and was thankful for the call.

## 2018-09-29 ENCOUNTER — Telehealth: Payer: Self-pay | Admitting: Internal Medicine

## 2018-09-29 DIAGNOSIS — I480 Paroxysmal atrial fibrillation: Secondary | ICD-10-CM

## 2018-09-29 DIAGNOSIS — R42 Dizziness and giddiness: Secondary | ICD-10-CM

## 2018-09-29 NOTE — Telephone Encounter (Signed)
Follow up:     Patient returning a call back. Please call patient back 

## 2018-09-29 NOTE — Telephone Encounter (Signed)
New message   Patient is returning call. Please call the patient.

## 2018-09-29 NOTE — Telephone Encounter (Signed)
Returned call, no answer, msg left , dr/rn out of office this afternoon, I will forward to Rn to follow up on

## 2018-09-29 NOTE — Telephone Encounter (Signed)
Notes from Dr. Harrington Challenger: Bonne Dolores to call pt  No answer  Heart monitor shows SR  No atrial fibrillation  How is he feeling  ? Dizzy  What is BP   Is he still having blood in urine  Stay off of anticoagulant for now   Confirm he has f/u in late summer   ____________________________________________________________________________  Called pt. He is still being treated for urinary infection. On 5th week of antibiotic. Going back to urologist for procedure on 5/16. Dizziness is improving. Happening less often, not every day. Continues to have blood in urine "unless I drink 10 bottles of water every day".  States BP has been perfect when checked. Eliquis removed from medication list.  Pt reports only wore the montor for 3 days, he was working outside and sweating and it fell off. He called the monitor company who said they would get in touch with our office to inform.     Will review with Dr. Harrington Challenger and if she would like to re order this will call pt back.

## 2018-09-30 NOTE — Telephone Encounter (Signed)
Will be hard to get I think Would set up for zio later this summer (3 months))  Wear 2 wks

## 2018-10-02 NOTE — Telephone Encounter (Signed)
Pt agreed with Dr. Harrington Challenger' recommendation to wait and reorder a Zio patch in about 3 months 12/2018 for 2 weeks and keep OV with her in Sept 2020.Marland Kitchen pt to call if he needs anything prior to then.

## 2018-10-03 ENCOUNTER — Other Ambulatory Visit: Payer: Self-pay | Admitting: *Deleted

## 2018-10-03 DIAGNOSIS — I480 Paroxysmal atrial fibrillation: Secondary | ICD-10-CM

## 2018-10-03 DIAGNOSIS — R42 Dizziness and giddiness: Secondary | ICD-10-CM

## 2018-10-30 ENCOUNTER — Other Ambulatory Visit: Payer: Medicare Other

## 2018-11-03 ENCOUNTER — Other Ambulatory Visit: Payer: Self-pay | Admitting: Urology

## 2018-11-07 DIAGNOSIS — M7062 Trochanteric bursitis, left hip: Secondary | ICD-10-CM | POA: Insufficient documentation

## 2018-11-24 ENCOUNTER — Other Ambulatory Visit (HOSPITAL_COMMUNITY)
Admission: RE | Admit: 2018-11-24 | Discharge: 2018-11-24 | Disposition: A | Discharge status: Home / Self Care | Payer: Medicare Other | Source: Ambulatory Visit | Attending: Urology | Admitting: Urology

## 2018-11-24 DIAGNOSIS — Z1159 Encounter for screening for other viral diseases: Secondary | ICD-10-CM | POA: Insufficient documentation

## 2018-11-24 LAB — SARS CORONAVIRUS 2 (TAT 6-24 HRS): SARS Coronavirus 2: NEGATIVE

## 2018-11-27 ENCOUNTER — Other Ambulatory Visit: Payer: Self-pay

## 2018-11-27 ENCOUNTER — Encounter (HOSPITAL_BASED_OUTPATIENT_CLINIC_OR_DEPARTMENT_OTHER): Payer: Self-pay | Admitting: *Deleted

## 2018-11-27 NOTE — Progress Notes (Addendum)
Spoke w/ pt via phone for pre-op interview.  Npo after mn.  Arrive at [employer].  Need istat.  Current ekg in chart and epic.  Will take am meds w/ sips of water.   Pt had covid test done 11-24-2018.  Pt denies any cardiac S&S , pt has PAF,  denies SOB.  Pt not on any anticoagulation stopped by cardiologist , dr Denman George, 09-05-2018 due to hematuria.  Chart to be reviewed by anesthesia , Konrad Felix PA.  ADDENDUM:  Per anesthesia , Janett Billow, Ok to proceed .

## 2018-11-27 NOTE — Progress Notes (Signed)

## 2018-11-27 NOTE — Anesthesia Preprocedure Evaluation (Addendum)
Anesthesia Evaluation  Patient identified by MRN, date of birth, ID band Patient awake    Reviewed: Allergy & Precautions, NPO status , Patient's Chart, lab work & pertinent test results  History of Anesthesia Complications (+) PONV and history of anesthetic complications  Airway Mallampati: III  TM Distance: >3 FB Neck ROM: Full    Dental no notable dental hx. (+) Teeth Intact   Pulmonary former smoker,    Pulmonary exam normal breath sounds clear to auscultation       Cardiovascular + CAD and + Cardiac Stents  Normal cardiovascular exam+ dysrhythmias Atrial Fibrillation  Rhythm:Regular Rate:Normal     Neuro/Psych Anxiety  Neuromuscular disease    GI/Hepatic GERD  Medicated and Controlled,  Endo/Other  Hypothyroidism Hyperlipidemia  Renal/GU Renal diseaseHx/o UPJ obstruction   Bladder Ca    Musculoskeletal  (+) Arthritis , Osteoarthritis,  Fibromyalgia -  Abdominal   Peds  Hematology negative hematology ROS (+)   Anesthesia Other Findings   Reproductive/Obstetrics                           Anesthesia Physical Anesthesia Plan  ASA: III  Anesthesia Plan: General   Post-op Pain Management:    Induction: Intravenous  PONV Risk Score and Plan: 4 or greater and Ondansetron, Dexamethasone and Treatment may vary due to age or medical condition  Airway Management Planned: LMA  Additional Equipment:   Intra-op Plan:   Post-operative Plan: Extubation in OR  Informed Consent: I have reviewed the patients History and Physical, chart, labs and discussed the procedure including the risks, benefits and alternatives for the proposed anesthesia with the patient or authorized representative who has indicated his/her understanding and acceptance.     Dental advisory given  Plan Discussed with: CRNA and Surgeon  Anesthesia Plan Comments: (See PAT note 11/27/2018, Konrad Felix,  PA-C  No N2O)      Anesthesia Quick Evaluation

## 2018-11-27 NOTE — Progress Notes (Signed)
Anesthesia Chart Review   Case: [id number] Date/Time: 11/28/18 0800   Procedure: CYSTOSCOPY WITH URETEROSCOPY ,POSSIBLE BIOPSY, POSSIBLE STENT PLACEMENT (Left )   Anesthesia type: General   Pre-op diagnosis: BLADDER CANCER   Location: [facility] OR ROOM 2 / [place]   Surgeon: Ceasar Mons, MD      DISCUSSION:68 y.o. former smoker (25 pack years, quit 06/01/03) with h/o PONV, GERD, anxiety, AAA (3.1cm followed by Dr. Coral Spikes with repeat US 2021), CAD (s/p PTCA to LAD 2000, last cath in 2003 normal coronary arteires, patent PTCA site), hypothyroidism, PAF (Eliquis being held due to hematuria), COPD, bladder cancer scheduled for above procedure 11/28/2018 with Dr. Harrell Gave Lovena Neighbours.   Pt last seen by cardiologist, Dr. Dorris Carnes, 06/26/2018.  Stable at this visit with follow up 01/2019.    S/p cystoscopy 04/21/18 with no anesthesia complications noted.   Anticipate pt can proceed with planned procedure barring acute status change and after evaluation DOS (SDW).  VS: Ht 6\' 6"  (1.981 m)   Wt 99.8 kg   BMI 25.42 kg/m   PROVIDERS: Lavone Orn, MD is PCP   Dorris Carnes, MD is Cardiologist  LABS: SDW (all labs ordered are listed, but only abnormal results are displayed)  Labs Reviewed - No data to display   IMAGES:   EKG: 04/24/18 Rate 54 bpm Sinus bradycardia  Low voltage QRS  CV: Echo 03/06/18 Study Conclusions  - Left ventricle: The cavity size was normal. Wall thickness was   increased in a pattern of mild LVH. Systolic function was normal.   The estimated ejection fraction was in the range of 55% to 60%.   Although no diagnostic regional wall motion abnormality was   identified, this possibility cannot be completely excluded on the   basis of this study. The study was not technically sufficient to   allow evaluation of LV diastolic dysfunction due to atrial   fibrillation. - Aortic valve: Trileaflet; moderately calcified leaflets.   Sclerosis  without stenosis. - Aorta: Mildly dilated aortic root. Aortic root dimension: 37 mm   (ED). - Mitral valve: Mildly calcified annulus. There was trivial   regurgitation. - Left atrium: The atrium was mildly dilated. - Right ventricle: The cavity size was normal. Systolic function   was normal. - Right atrium: The atrium was mildly dilated. - Tricuspid valve: Peak RV-RA gradient (S): 17 mm Hg. - Pulmonary arteries: PA peak pressure: 20 mm Hg (S). - Inferior vena cava: The vessel was normal in size. The   respirophasic diameter changes were in the normal range (>= 50%),   consistent with normal central venous pressure.  Impressions:  - The patient was in atrial fibrillation. Normal LV size with mild   LV hypertrophy. EF 55-60%. Normal RV size and systolic function.   No significant valvular abnormalities. Past Medical History:  Diagnosis Date  . Aneurysm of abdominal aorta (HCC)    a. ultasound 08-04-16 --- 3.1 CM X 3.1 CM - sees Dr. Laurann Montana - F/u due 2021.  Marland Kitchen Anxiety   . Benign localized prostatic hyperplasia with lower urinary tract symptoms (LUTS)   . Bladder cancer [facility]) urologsit-  dr winter   BCG treatment's   . COPD with emphysema ([place])   . Coronary artery disease primary cardiologist-- dr Arville Lime   a. s/p PTCA to dLAD 2000. b. Normal coronaries in 2003, patent prior PTCA site.  . Dizziness    intermittant  . Dysuria   . Feeling light headed  intermittant  . Fibromyalgia   . GERD (gastroesophageal reflux disease)   . History of kidney stones   . History of skin cancer   . Hypothyroidism   . OA (osteoarthritis)    back, shoulders , hips  . PAF (paroxysmal atrial fibrillation) ([place]) 03/05/2018   dx in setting sepsis---  cardiologist-- dr Arville Lime (per dr Denman George note in epic pt was to stop eliquis due to hematuria last dose 09-05-2018)  . PONV (postoperative nausea and vomiting)    NAUSEA ALL DAY AFTER 02-01-17 SURGERY, PT WANTS ANESTHESIA LIKE 06-23-15 SURGERY  WITH DR  [name]  . S/P percutaneous transluminal coronary angioplasty    05-18-1999  to dLAD  . Wears glasses     Past Surgical History:  Procedure Laterality Date  . CARDIAC CATHETERIZATION  06-14-2001  dr Einar Gip   normal coronary arteries and patent previous dLAD   . CARDIOVASCULAR STRESS TEST  06/12/2008   Low risk nuclear study w/ no evidence ischemia/  normal LV funciton and wall motion , ef 60%  . COLONOSCOPY WITH PROPOFOL N/A 12/13/2013   Procedure: COLONOSCOPY WITH PROPOFOL;  Surgeon: Garlan Fair, MD;  Location: [facility];  Service: Endoscopy;  Laterality: N/A;  . CORONARY ANGIOPLASTY  05-18-1999  dr Tami Ribas   PTCA balloon to dLAD 80% (single vessel disease),  ef 60%  . CYSTOSCOPY WITH FULGERATION N/A 08/15/2017   Procedure: CYSTOSCOP/ BLADDER BIOPSY/TURBT;  Surgeon: Nickie Retort, MD;  Location: [facility];  Service: Urology;  Laterality: N/A;  . CYSTOSCOPY WITH INSERTION OF UROLIFT N/A 06/23/2015   Procedure: CYSTOSCOPY WITH INSERTION OF UROLIFT;  Surgeon: Carolan Clines, MD;  Location: [facility];  Service: Urology;  Laterality: N/A;  . CYSTOSCOPY WITH STENT PLACEMENT Left 04/21/2018   Procedure: CYSTOSCOPY WITH STENT PLACEMENT;  Surgeon: Bjorn Loser, MD;  Location: [facility];  Service: Urology;  Laterality: Left;  Emergent case  . CYSTOSCOPY WITH URETEROSCOPY AND STENT PLACEMENT Left 04/07/2018   Procedure: CYSTOSCOPY WITH URETEROSCOPY AND STENT EXCHANGE/ BLADDER BIOPSY/LASER ABLATION OF URETERAL TUMOR;  Surgeon: Ceasar Mons, MD;  Location: [facility];  Service: Urology;  Laterality: Left;  ONLY NEEDS 60 MIN  . CYSTOSCOPY/URETEROSCOPY/HOLMIUM LASER/STENT PLACEMENT Bilateral 02/01/2017   Procedure: CYSTOSCOPY/URETEROSCOPY/HOLMIUM LASER/STENT PLACEMENT,STONE BASKETRY, BLADDER BIOPSY;  Surgeon: Nickie Retort, MD;  [facility];  Service: Urology;  Laterality: Bilateral;  .  ESOPHAGOGASTRODUODENOSCOPY  08-24-2006  . TRANSTHORACIC ECHOCARDIOGRAM  03/06/2018   mild LVH,  ef 55-60%,  pt in atrial fib. LV diastolic not evaluated/  mild LAE and RAE/  moderate AV calcification with sclerosis , no stenosis or regurg./  mild dilated aortic root, 26mm/  trival MR and TR  . TRANSURETHRAL INCISION OF PROSTATE N/A 04/01/2014   Procedure: TRANSURETHRAL INCISION OF THE PROSTATE (TUIP);  Surgeon: Ailene Rud, MD;  [facility];  Service: Urology;  Laterality: N/A;    MEDICATIONS: No current facility-administered medications for this encounter.    Marland Kitchen ALPRAZolam (XANAX) 1 MG tablet  . diltiazem (CARDIZEM CD) 180 MG 24 hr capsule  . esomeprazole (NEXIUM) 40 MG capsule  . gabapentin (NEURONTIN) 800 MG tablet  . levothyroxine (SYNTHROID, LEVOTHROID) 50 MCG tablet  . tamsulosin (FLOMAX) 0.4 MG CAPS capsule  . valACYclovir (VALTREX) 1000 MG tablet  . vitamin B-12 (CYANOCOBALAMIN) 1000 MCG tablet  . rosuvastatin (CRESTOR) 20 MG tablet     Maia Plan [facility] Pre-Surgical Testing [phone] 11/27/18  10:52 AM

## 2018-11-28 ENCOUNTER — Ambulatory Visit (HOSPITAL_BASED_OUTPATIENT_CLINIC_OR_DEPARTMENT_OTHER): Payer: Medicare Other | Admitting: Physician Assistant

## 2018-11-28 ENCOUNTER — Encounter (HOSPITAL_BASED_OUTPATIENT_CLINIC_OR_DEPARTMENT_OTHER)
Admission: RE | Disposition: A | Discharge status: Home / Self Care | Payer: Self-pay | Source: Home / Self Care | Attending: Urology

## 2018-11-28 ENCOUNTER — Encounter (HOSPITAL_BASED_OUTPATIENT_CLINIC_OR_DEPARTMENT_OTHER): Payer: Self-pay | Admitting: *Deleted

## 2018-11-28 ENCOUNTER — Other Ambulatory Visit: Payer: Self-pay

## 2018-11-28 ENCOUNTER — Ambulatory Visit (HOSPITAL_BASED_OUTPATIENT_CLINIC_OR_DEPARTMENT_OTHER)
Admission: RE | Admit: 2018-11-28 | Discharge: 2018-11-28 | Disposition: A | Discharge status: Home / Self Care | Payer: Medicare Other | Attending: Urology | Admitting: Urology

## 2018-11-28 DIAGNOSIS — M797 Fibromyalgia: Secondary | ICD-10-CM | POA: Diagnosis not present

## 2018-11-28 DIAGNOSIS — N3080 Other cystitis without hematuria: Secondary | ICD-10-CM | POA: Diagnosis not present

## 2018-11-28 DIAGNOSIS — Z79899 Other long term (current) drug therapy: Secondary | ICD-10-CM | POA: Insufficient documentation

## 2018-11-28 DIAGNOSIS — I2510 Atherosclerotic heart disease of native coronary artery without angina pectoris: Secondary | ICD-10-CM | POA: Diagnosis not present

## 2018-11-28 DIAGNOSIS — C676 Malignant neoplasm of ureteric orifice: Secondary | ICD-10-CM | POA: Diagnosis present

## 2018-11-28 DIAGNOSIS — Z9861 Coronary angioplasty status: Secondary | ICD-10-CM | POA: Diagnosis not present

## 2018-11-28 DIAGNOSIS — F419 Anxiety disorder, unspecified: Secondary | ICD-10-CM | POA: Insufficient documentation

## 2018-11-28 DIAGNOSIS — M1990 Unspecified osteoarthritis, unspecified site: Secondary | ICD-10-CM | POA: Diagnosis not present

## 2018-11-28 DIAGNOSIS — N401 Enlarged prostate with lower urinary tract symptoms: Secondary | ICD-10-CM | POA: Diagnosis not present

## 2018-11-28 DIAGNOSIS — Z885 Allergy status to narcotic agent status: Secondary | ICD-10-CM | POA: Diagnosis not present

## 2018-11-28 DIAGNOSIS — N200 Calculus of kidney: Secondary | ICD-10-CM | POA: Insufficient documentation

## 2018-11-28 DIAGNOSIS — E039 Hypothyroidism, unspecified: Secondary | ICD-10-CM | POA: Diagnosis not present

## 2018-11-28 DIAGNOSIS — Z79890 Hormone replacement therapy (postmenopausal): Secondary | ICD-10-CM | POA: Insufficient documentation

## 2018-11-28 DIAGNOSIS — Z87891 Personal history of nicotine dependence: Secondary | ICD-10-CM | POA: Insufficient documentation

## 2018-11-28 DIAGNOSIS — J439 Emphysema, unspecified: Secondary | ICD-10-CM | POA: Insufficient documentation

## 2018-11-28 DIAGNOSIS — K219 Gastro-esophageal reflux disease without esophagitis: Secondary | ICD-10-CM | POA: Insufficient documentation

## 2018-11-28 DIAGNOSIS — I480 Paroxysmal atrial fibrillation: Secondary | ICD-10-CM | POA: Diagnosis not present

## 2018-11-28 HISTORY — DX: Dysuria: R30.0

## 2018-11-28 HISTORY — PX: CYSTOSCOPY WITH URETEROSCOPY AND STENT PLACEMENT: SHX6377

## 2018-11-28 HISTORY — DX: Emphysema, unspecified: J43.9

## 2018-11-28 HISTORY — DX: Unspecified osteoarthritis, unspecified site: M19.90

## 2018-11-28 HISTORY — DX: Dizziness and giddiness: R42

## 2018-11-28 LAB — POCT I-STAT, CHEM 8
BUN: 17 mg/dL (ref 8–23)
Calcium, Ion: 1.23 mmol/L (ref 1.15–1.40)
Chloride: 104 mmol/L (ref 98–111)
Creatinine, Ser: 0.9 mg/dL (ref 0.61–1.24)
Glucose, Bld: 101 mg/dL — ABNORMAL HIGH (ref 70–99)
HCT: 48 % (ref 39.0–52.0)
Hemoglobin: 16.3 g/dL (ref 13.0–17.0)
Potassium: 3.8 mmol/L (ref 3.5–5.1)
Sodium: 141 mmol/L (ref 135–145)
TCO2: 25 mmol/L (ref 22–32)

## 2018-11-28 SURGERY — CYSTOURETEROSCOPY, WITH STENT INSERTION
Anesthesia: General | Site: Urethra | Laterality: Left

## 2018-11-28 MED ORDER — FENTANYL CITRATE (PF) 100 MCG/2ML IJ SOLN
INTRAMUSCULAR | Status: AC
  Filled 2018-11-28: qty 2

## 2018-11-28 MED ORDER — EPHEDRINE SULFATE 50 MG/ML IJ SOLN
INTRAMUSCULAR | Status: DC | PRN
  Administered 2018-11-28 (×2): 10 mg via INTRAVENOUS

## 2018-11-28 MED ORDER — IOHEXOL 300 MG/ML  SOLN
INTRAMUSCULAR | Status: DC | PRN
  Administered 2018-11-28: 09:00:00 20 mL via URETHRAL

## 2018-11-28 MED ORDER — HYDROCODONE-ACETAMINOPHEN 5-325 MG PO TABS: 1.0000 | ORAL_TABLET | ORAL | 0 refills | Status: DC | PRN

## 2018-11-28 MED ORDER — PHENYLEPHRINE HCL (PRESSORS) 10 MG/ML IV SOLN
INTRAVENOUS | Status: DC | PRN
  Administered 2018-11-28 (×3): 40 ug via INTRAVENOUS

## 2018-11-28 MED ORDER — PHENAZOPYRIDINE HCL 200 MG PO TABS: 200.0000 mg | ORAL_TABLET | Freq: Three times a day (TID) | ORAL | 0 refills | Status: DC | PRN

## 2018-11-28 MED ORDER — BELLADONNA ALKALOIDS-OPIUM 16.2-60 MG RE SUPP
RECTAL | Status: AC
  Filled 2018-11-28: qty 1

## 2018-11-28 MED ORDER — OXYBUTYNIN CHLORIDE 5 MG PO TABS: 5.0000 mg | ORAL_TABLET | Freq: Three times a day (TID) | ORAL | 1 refills | Status: DC | PRN

## 2018-11-28 MED ORDER — LACTATED RINGERS IV SOLN
INTRAVENOUS | Status: DC
  Administered 2018-11-28 (×2): via INTRAVENOUS
  Filled 2018-11-28: qty 1000

## 2018-11-28 MED ORDER — BELLADONNA ALKALOIDS-OPIUM 16.2-60 MG RE SUPP
RECTAL | Status: DC | PRN
  Administered 2018-11-28: 1 via RECTAL

## 2018-11-28 MED ORDER — KETOROLAC TROMETHAMINE 30 MG/ML IJ SOLN
INTRAMUSCULAR | Status: DC | PRN
  Administered 2018-11-28: 30 mg via INTRAVENOUS

## 2018-11-28 MED ORDER — CIPROFLOXACIN IN D5W 400 MG/200ML IV SOLN
400.0000 mg | Freq: Once | INTRAVENOUS | Status: AC
  Administered 2018-11-28: 400 mg via INTRAVENOUS
  Filled 2018-11-28: qty 200

## 2018-11-28 MED ORDER — ONDANSETRON HCL 4 MG/2ML IJ SOLN
INTRAMUSCULAR | Status: AC
  Filled 2018-11-28: qty 2

## 2018-11-28 MED ORDER — DEXAMETHASONE SODIUM PHOSPHATE 10 MG/ML IJ SOLN
INTRAMUSCULAR | Status: AC
  Filled 2018-11-28: qty 1

## 2018-11-28 MED ORDER — GLYCOPYRROLATE 0.2 MG/ML IJ SOLN
INTRAMUSCULAR | Status: DC | PRN
  Administered 2018-11-28: 0.2 mg via INTRAVENOUS

## 2018-11-28 MED ORDER — KETOROLAC TROMETHAMINE 30 MG/ML IJ SOLN
INTRAMUSCULAR | Status: AC
  Filled 2018-11-28: qty 1

## 2018-11-28 MED ORDER — LIDOCAINE 2% (20 MG/ML) 5 ML SYRINGE
INTRAMUSCULAR | Status: AC
  Filled 2018-11-28: qty 5

## 2018-11-28 MED ORDER — LIDOCAINE HCL (CARDIAC) PF 100 MG/5ML IV SOSY
PREFILLED_SYRINGE | INTRAVENOUS | Status: DC | PRN
  Administered 2018-11-28: 60 mg via INTRAVENOUS

## 2018-11-28 MED ORDER — PROPOFOL 10 MG/ML IV BOLUS
INTRAVENOUS | Status: AC
  Filled 2018-11-28: qty 40

## 2018-11-28 MED ORDER — CIPROFLOXACIN IN D5W 400 MG/200ML IV SOLN
INTRAVENOUS | Status: AC
  Filled 2018-11-28: qty 200

## 2018-11-28 MED ORDER — DEXAMETHASONE SODIUM PHOSPHATE 4 MG/ML IJ SOLN
INTRAMUSCULAR | Status: DC | PRN
  Administered 2018-11-28: 10 mg via INTRAVENOUS

## 2018-11-28 MED ORDER — MIDAZOLAM HCL 2 MG/2ML IJ SOLN
INTRAMUSCULAR | Status: AC
  Filled 2018-11-28: qty 2

## 2018-11-28 MED ORDER — CIPROFLOXACIN HCL 500 MG PO TABS: 500.0000 mg | ORAL_TABLET | Freq: Two times a day (BID) | ORAL | 0 refills | Status: AC

## 2018-11-28 MED ORDER — ONDANSETRON HCL 4 MG/2ML IJ SOLN
INTRAMUSCULAR | Status: DC | PRN
  Administered 2018-11-28: 4 mg via INTRAVENOUS

## 2018-11-28 MED ORDER — FENTANYL CITRATE (PF) 100 MCG/2ML IJ SOLN
INTRAMUSCULAR | Status: DC | PRN
  Administered 2018-11-28: 100 ug via INTRAVENOUS

## 2018-11-28 MED ORDER — PROPOFOL 10 MG/ML IV BOLUS
INTRAVENOUS | Status: DC | PRN
  Administered 2018-11-28: 150 mg via INTRAVENOUS

## 2018-11-28 SURGICAL SUPPLY — 36 items
BAG DRAIN URO-CYSTO SKYTR STRL (DRAIN) ×3 IMPLANT
BASKET STONE 1.7 NGAGE (UROLOGICAL SUPPLIES) ×2 IMPLANT
BASKET ZERO TIP NITINOL 2.4FR (BASKET) ×1 IMPLANT
BENZOIN TINCTURE PRP APPL 2/3 (GAUZE/BANDAGES/DRESSINGS) IMPLANT
CATH URET 5FR 28IN OPEN ENDED (CATHETERS) ×2 IMPLANT
CLOSURE WOUND 1/2 X4 (GAUZE/BANDAGES/DRESSINGS)
CLOTH BEACON ORANGE TIMEOUT ST (SAFETY) ×3 IMPLANT
ELECT REM PT RETURN 9FT ADLT (ELECTROSURGICAL) ×3
ELECTRODE REM PT RTRN 9FT ADLT (ELECTROSURGICAL) IMPLANT
FIBER LASER FLEXIVA 365 (UROLOGICAL SUPPLIES) IMPLANT
FIBER LASER TRAC TIP (UROLOGICAL SUPPLIES) IMPLANT
GLOVE BIO SURGEON STRL SZ7 (GLOVE) ×2 IMPLANT
GLOVE BIO SURGEON STRL SZ7.5 (GLOVE) ×3 IMPLANT
GLOVE BIOGEL PI IND STRL 7.0 (GLOVE) IMPLANT
GLOVE BIOGEL PI INDICATOR 7.0 (GLOVE) ×2
GOWN STRL REUS W/ TWL LRG LVL3 (GOWN DISPOSABLE) IMPLANT
GOWN STRL REUS W/TWL LRG LVL3 (GOWN DISPOSABLE) ×2
GOWN STRL REUS W/TWL XL LVL3 (GOWN DISPOSABLE) ×3 IMPLANT
GUIDEWIRE STR DUAL SENSOR (WIRE) ×2 IMPLANT
GUIDEWIRE ZIPWRE .038 STRAIGHT (WIRE) ×3 IMPLANT
IV NS 1000ML (IV SOLUTION)
IV NS 1000ML BAXH (IV SOLUTION) IMPLANT
IV NS IRRIG 3000ML ARTHROMATIC (IV SOLUTION) ×3 IMPLANT
KIT TURNOVER CYSTO (KITS) ×3 IMPLANT
MANIFOLD NEPTUNE II (INSTRUMENTS) ×3 IMPLANT
NDL SAFETY ECLIPSE 18X1.5 (NEEDLE) IMPLANT
NEEDLE HYPO 18GX1.5 SHARP (NEEDLE) ×2
NS IRRIG 500ML POUR BTL (IV SOLUTION) ×4 IMPLANT
PACK CYSTO (CUSTOM PROCEDURE TRAY) ×3 IMPLANT
STENT URET 6FRX26 CONTOUR (STENTS) ×2 IMPLANT
STRIP CLOSURE SKIN 1/2X4 (GAUZE/BANDAGES/DRESSINGS) IMPLANT
SYR 10ML LL (SYRINGE) ×3 IMPLANT
TUBE CONNECTING 12'X1/4 (SUCTIONS) ×1
TUBE CONNECTING 12X1/4 (SUCTIONS) ×1 IMPLANT
TUBING UROLOGY SET (TUBING) ×3 IMPLANT
WATER STERILE IRR 3000ML UROMA (IV SOLUTION) ×2 IMPLANT

## 2018-11-28 NOTE — H&P (Signed)
Urology Preoperative H&P   Chief Complaint: Left ureteral cancer  History of Present Illness: Marvin Gill is a 68 y.o. 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 He also has a history of kidney stones, ED, chronic orchalgia and BPH with lower urinary tract symptoms. The patient was diagnosed with A-fib following his ureteroscopy on 03/03/18 and was recently started on Eliquis (followed by Dr. Dorris Carnes)   Marvin Gill is here today for a surveillance cystoscopy and left ureteroscopy to assess for signs of cancer recurrence.  Past Medical History:  Diagnosis Date  . Aneurysm of abdominal aorta (HCC)    a. ultasound 08-04-16 --- 3.1 CM X 3.1 CM - sees Dr. Laurann Montana - F/u due 2021.  Marland Kitchen Anxiety   . Benign localized prostatic hyperplasia with lower urinary tract symptoms (LUTS)   . Bladder cancer [facility]) urologsit-  dr    BCG treatment's   . COPD with emphysema ([place])   . Coronary artery disease primary cardiologist-- dr Arville Lime   a. s/p PTCA to dLAD 2000. b. Normal coronaries in 2003, patent prior PTCA site.  . Dizziness    intermittant  . Dysuria   . Feeling light headed    intermittant  . Fibromyalgia   . GERD (gastroesophageal reflux disease)   . History of kidney stones   . History of skin cancer   . Hypothyroidism   . OA (osteoarthritis)    back, shoulders , hips  . PAF (paroxysmal atrial fibrillation) ([place]) 03/05/2018   dx in setting sepsis---  cardiologist-- dr Arville Lime (per dr Denman George note in epic pt was to stop eliquis due to hematuria last dose 09-05-2018)  . PONV (postoperative nausea and vomiting)    NAUSEA ALL DAY AFTER 02-01-17 SURGERY, PT WANTS ANESTHESIA LIKE 06-23-15 SURGERY WITH DR  [name]  . S/P percutaneous transluminal coronary angioplasty    05-18-1999  to dLAD  . Wears glasses     Past Surgical History:  Procedure Laterality Date  . CARDIAC CATHETERIZATION  06-14-2001  dr Einar Gip   normal coronary arteries and patent previous dLAD   . CARDIOVASCULAR STRESS TEST  06/12/2008   Low risk nuclear study w/ no evidence ischemia/  normal LV funciton and wall motion , ef 60%  . COLONOSCOPY WITH PROPOFOL N/A 12/13/2013   Procedure: COLONOSCOPY WITH PROPOFOL;  Surgeon: Garlan Fair, MD;  Location: [facility];  Service: Endoscopy;  Laterality: N/A;  . CORONARY ANGIOPLASTY  05-18-1999  dr Tami Ribas   PTCA balloon to dLAD 80% (single vessel disease),  ef 60%  . CYSTOSCOPY WITH FULGERATION N/A 08/15/2017   Procedure: CYSTOSCOP/ BLADDER BIOPSY/TURBT;  Surgeon: Nickie Retort, MD;  Location: [facility];  Service: Urology;  Laterality: N/A;  . CYSTOSCOPY WITH INSERTION OF UROLIFT N/A 06/23/2015   Procedure: CYSTOSCOPY WITH INSERTION OF UROLIFT;  Surgeon: Carolan Clines, MD;  Location: [facility];  Service: Urology;  Laterality: N/A;  . CYSTOSCOPY WITH STENT PLACEMENT Left 04/21/2018   Procedure: CYSTOSCOPY WITH STENT PLACEMENT;  Surgeon: Bjorn Loser, MD;  Location: [place];  Service: Urology;  Laterality: Left;  Emergent case  . CYSTOSCOPY WITH URETEROSCOPY AND STENT PLACEMENT Left 04/07/2018   Procedure: CYSTOSCOPY WITH URETEROSCOPY AND STENT EXCHANGE/ BLADDER BIOPSY/LASER ABLATION OF URETERAL TUMOR;  Surgeon: Ceasar Mons, MD;  Location[facility];  Service: Urology;  Laterality: Left;  ONLY NEEDS 60 MIN  . CYSTOSCOPY/URETEROSCOPY/HOLMIUM LASER/STENT PLACEMENT Bilateral 02/01/2017  Procedure: CYSTOSCOPY/URETEROSCOPY/HOLMIUM LASER/STENT PLACEMENT,STONE BASKETRY, BLADDER BIOPSY;  Surgeon: Nickie Retort, MD;  Location: [facility];  Service: Urology;  Laterality: Bilateral;  . ESOPHAGOGASTRODUODENOSCOPY  08-24-2006  . TRANSTHORACIC ECHOCARDIOGRAM  03/06/2018   mild LVH,  ef 55-60%,  pt in atrial fib. LV diastolic not evaluated/  mild LAE and RAE/  moderate AV calcification with sclerosis , no stenosis or regurg./  mild  dilated aortic root, 23mm/  trival MR and TR  . TRANSURETHRAL INCISION OF PROSTATE N/A 04/01/2014   Procedure: TRANSURETHRAL INCISION OF THE PROSTATE (TUIP);  Surgeon: Ailene Rud, MD;  Location: [facility];  Service: Urology;  Laterality: N/A;    Allergies:  Allergies  Allergen Reactions  . Codeine Nausea And Vomiting    "made my head feel funny too"    Family History  Problem Relation Age of Onset  . Anuerysm Mother        Brain    Social History:  reports that he quit smoking about 15 years ago. His smoking use included cigarettes. He has a 25.00 pack-year smoking history. He has never used smokeless tobacco. He reports that he does not drink alcohol or use drugs.  ROS: A complete review of systems was performed.  All systems are negative except for pertinent findings as noted.  Physical Exam:  Vital signs in last 24 hours: Temp:  [97.7 F (36.5 C)] 97.7 F (36.5 C) (06/30 0635) Pulse Rate:  [54] 54 (06/30 0635) Resp:  [16] 16 (06/30 0635) BP: (120)/(81) 120/81 (06/30 0635) SpO2:  [99 %] 99 % (06/30 0635) Weight:  [95.4 kg-99.8 kg] 95.4 kg (06/30 0635) Constitutional:  Alert and oriented, No acute distress Cardiovascular: Regular rate and rhythm, No JVD Respiratory: Normal respiratory effort, Lungs clear bilaterally GI: Abdomen is soft, nontender, nondistended, no abdominal masses GU: No CVA tenderness Lymphatic: No lymphadenopathy Neurologic: Grossly intact, no focal deficits Psychiatric: Normal mood and affect  Laboratory Data:  Recent Labs    11/28/18 0702  HGB 16.3  HCT 48.0    Recent Labs    11/28/18 0702  NA 141  K 3.8  CL 104  GLUCOSE 101*  BUN 17  CREATININE 0.90     Results for orders placed or performed during the hospital encounter of 11/28/18 (from the past 24 hour(s))  I-STAT, chem 8     Status: Abnormal   Collection Time: 11/28/18  7:02 AM  Result Value Ref Range   Sodium 141 135 - 145 mmol/L   Potassium 3.8  3.5 - 5.1 mmol/L   Chloride 104 98 - 111 mmol/L   BUN 17 8 - 23 mg/dL   Creatinine, Ser 0.90 0.61 - 1.24 mg/dL   Glucose, Bld 101 (H) 70 - 99 mg/dL   Calcium, Ion 1.23 1.15 - 1.40 mmol/L   TCO2 25 22 - 32 mmol/L   Hemoglobin 16.3 13.0 - 17.0 g/dL   HCT 48.0 39.0 - 52.0 %   Recent Results (from the past 240 hour(s))  SARS Coronavirus 2 (Performed in [place] hospital lab)     Status: None   Collection Time: 11/24/18  1:38 PM   Specimen: Nasal Swab  Result Value Ref Range Status   SARS Coronavirus 2 NEGATIVE NEGATIVE Final    Comment: (NOTE) SARS-CoV-2 target nucleic acids are NOT DETECTED. The SARS-CoV-2 RNA is generally detectable in upper and lower respiratory specimens during the acute phase of infection. Negative results do not preclude SARS-CoV-2 infection, do not rule out co-infections  with other pathogens, and should not be used as the sole basis for treatment or other patient management decisions. Negative results must be combined with clinical observations, patient history, and epidemiological information. The expected result is Negative. Fact Sheet for Patients: SugarRoll.be Fact Sheet for Healthcare Providers: https://www.woods-mathews.com/ This test is not yet approved or cleared by the [place] FDA and  has been authorized for detection and/or diagnosis of SARS-CoV-2 by FDA under an Emergency Use Authorization (EUA). This EUA will remain  in effect (meaning this test can be used) for the duration of the COVID-19 declaration under Section 56 4(b)(1) of the Act, 21 U.S.C. section 360bbb-3(b)(1), unless the authorization is terminated or revoked sooner. Performed at [facility], [place] [address], [place], [place] [address]     Renal Function: Recent Labs    11/28/18 3810  CREATININE 0.90   Estimated Creatinine Clearance: 100.1 mL/min (by C-G formula based on SCr of 0.9 mg/dL).  Radiologic Imaging: No  results found.  I independently reviewed the above imaging studies.  Assessment and Plan AVANTE CARNEIRO is a 68 y.o. male with a history of low-grade TA urothelial carcinoma of the distal left ureter  The risks, benefits and alternatives of cystoscopy with LEFT ureteroscopy, possible ureteral/bladder biopsy and possible ureteral stent placement was discussed the patient.  Risks included, but are not limited to: bleeding, urinary tract infection, ureteral injury/avulsion, ureteral stricture formation, MI, stroke, PE and the inherent risks of general anesthesia.  The patient voices understanding and wishes to proceed.      Ellison Hughs, MD 11/28/2018, 7:57 AM  [facility] Pager: [phone]

## 2018-11-28 NOTE — Anesthesia Procedure Notes (Signed)
Procedure Name: LMA Insertion Date/Time: 11/28/2018 8:20 AM Performed by: Justice Rocher, CRNA Pre-anesthesia Checklist: Patient identified, Emergency Drugs available, Suction available and Patient being monitored Patient Re-evaluated:Patient Re-evaluated prior to induction Oxygen Delivery Method: Circle system utilized Preoxygenation: Pre-oxygenation with 100% oxygen Induction Type: IV induction Ventilation: Mask ventilation without difficulty LMA: LMA inserted LMA Size: 5.0 Number of attempts: 1 Airway Equipment and Method: Bite block Placement Confirmation: positive ETCO2 and breath sounds checked- equal and bilateral Tube secured with: Tape Dental Injury: Teeth and Oropharynx as per pre-operative assessment

## 2018-11-28 NOTE — Transfer of Care (Signed)
Immediate Anesthesia Transfer of Care Note  Patient: Christipher Rieger Velazquez  Procedure(s) Performed: Procedure(s) (LRB): CYSTOSCOPY WITH URETEROSCOPY , LEFT URETERAL AND BLADDER BIOPSY, LEFT STENT PLACEMENT (Left)  Patient Location: PACU  Anesthesia Type: General  Level of Consciousness: awake, sedated, patient cooperative and responds to stimulation  Airway & Oxygen Therapy: Patient Spontanous Breathing and Patient connected to [place] O 2 with soft face mask   Post-op Assessment: Report given to PACU RN, Post -op Vital signs reviewed and stable and Patient moving all extremities  Post vital signs: Reviewed and stable  Complications: No apparent anesthesia complications

## 2018-11-28 NOTE — Discharge Instructions (Signed)
Avoid Morin,Aleve Ibuprofen until after 3 pm.   Post Anesthesia Home Care Instructions  Activity: Get plenty of rest for the remainder of the day. A responsible individual must stay with you for 24 hours following the procedure.  For the next 24 hours, DO NOT: -Drive a car -Paediatric nurse -Drink alcoholic beverages -Take any medication unless instructed by your physician -Make any legal decisions or sign important papers.  Meals: Start with liquid foods such as gelatin or soup. Progress to regular foods as tolerated. Avoid greasy, spicy, heavy foods. If nausea and/or vomiting occur, drink only clear liquids until the nausea and/or vomiting subsides. Call your physician if vomiting continues.  Special Instructions/Symptoms: Your throat may feel dry or sore from the anesthesia or the breathing tube placed in your throat during surgery. If this causes discomfort, gargle with warm salt water. The discomfort should disappear within 24 hours.  If you had a scopolamine patch placed behind your ear for the management of post- operative nausea and/or vomiting:  1. The medication in the patch is effective for 72 hours, after which it should be removed.  Wrap patch in a tissue and discard in the trash. Wash hands thoroughly with soap and water. 2. You may remove the patch earlier than 72 hours if you experience unpleasant side effects which may include dry mouth, dizziness or visual disturbances. 3. Avoid touching the patch. Wash your hands with soap and water after contact with the patch.

## 2018-11-28 NOTE — Op Note (Signed)
Operative Note  Preoperative diagnosis:  1.  History of low-grade TA urothelial carcinoma of the left distal ureter  Postoperative diagnosis: 1.  History of low-grade TA urothelial carcinoma of the left distal ureter 2.  1 cm patchy erythematous lesions involving the posterior bladder wall and mucosa adjacent to the left ureteral orifice 3.  8 mm right lower pole renal stone  Procedure(s): 1.  Cystoscopy with left ureteroscopy, left ureteral biopsy and left JJ stent placement 2.  Cold cup bladder biopsy with fulguration 3.  Bilateral retrograde pyelograms with intraoperative interpretation of fluoroscopic imaging  Surgeon: Ellison Hughs, MD  Assistants:  None  Anesthesia:  General  Complications:  None  EBL: 5 mL  Specimens: 1.  Posterior bladder wall biopsy 2.  Left ureteral orifice biopsy 3.  Left ureteral biopsy  Drains/Catheters: 1.  6 French, 26 cm JJ stent without tether  Intraoperative findings:   1. There was a 1 cm patchy, erythematous lesion involving the posterior bladder wall.  There was also a 1 cm patchy and erythematous area immediately adjacent to the left ureteral orifice.  There was not a distinct papillary appearance to these lesions, but is concerning for recurrence of his urothelial carcinoma. 2. The most distal aspects of the left ureter was mildly erythematous with no distinct papillary tumor identified.  No papillary tumors or concerning lesions were identified in the remainder of the left ureter or within the left renal pelvis.  Solitary left collecting system with no filling defects or dilation involving the left ureter or left renal pelvis seen on retrograde pyelogram 3. Solitary right collecting system with no filling defects or dilation involving the right ureter or right renal pelvis seen on retrograde pyelogram.  There was an 8 mm calcification seen in the lower pole of the right kidney, consistent with the stone seen on his CT from April  2020.   Indication:  Marvin Gill is a 68 y.o. male with a history of low-grade TA urothelial carcinoma of the left ureteral orifice/distal left ureter.  He is here today for surveillance cystoscopy/ureteroscopy.  He has been consented for the above procedures, voices understanding and wishes to proceed.  Description of procedure:  After informed consent was obtained, the patient was brought to the operating room and general LMA anesthesia was administered. The patient was then placed in the dorsolithotomy position and prepped and draped in the usual sterile fashion. A timeout was performed. A 23 French rigid cystoscope was then inserted into the urethral meatus and advanced into the bladder under direct vision. A complete bladder survey revealed the findings listed above.  A 5 French ureteral catheter was then inserted into the left ureteral orifice and a retrograde pyelogram was obtained, with the findings listed above.  A Glidewire was then advanced through the lumen of the ureteral catheter and up to the left renal pelvis, or fluoroscopic guidance.  A flexible ureteroscope was then advanced alongside the wire and the entire left collecting system was surveyed.  He was found to have a small area of mildly erythematous mucosa in the most distal aspects of the left ureter.  An engage basket was then used to scrape the mucosa free and obtain the tissue for permanent section.  The flexible ureteroscope was then removed.  The rigid cystoscope was then reinserted into the bladder.  I then took a cold cup biopsy of the erythematous lesion involving the posterior bladder wall as well as the erythematous lesion immediately adjacent to the left ureteral  orifice.  The specimens were sent off separately for permanent section.  The areas that were biopsied were then fulgurated until hemostasis was achieved.  I then placed a 6 [place], 26 cm JJ stent over the left sided Glidewire and into good position within  the left collecting system, confirming placement via fluoroscopy.  The patient's bladder was drained.  He tolerated the procedure well and was transferred to the postanesthesia in stable condition.  Plan: Follow-up in 1 week for office cystoscopy, stent removal and to discuss his pathology report.

## 2018-11-28 NOTE — Anesthesia Postprocedure Evaluation (Signed)
Anesthesia Post Note  Patient: Felton Buczynski Thone  Procedure(s) Performed: CYSTOSCOPY WITH URETEROSCOPY , LEFT URETERAL AND BLADDER BIOPSY, LEFT STENT PLACEMENT (Left Urethra)     Patient location during evaluation: PACU Anesthesia Type: General Level of consciousness: awake and alert Pain management: pain level controlled Vital Signs Assessment: post-procedure vital signs reviewed and stable Respiratory status: spontaneous breathing, nonlabored ventilation and respiratory function stable Cardiovascular status: blood pressure returned to baseline and stable Postop Assessment: no apparent nausea or vomiting Anesthetic complications: no    Last Vitals:  Vitals:   11/28/18 0945 11/28/18 1000  BP: 116/80 137/71  Pulse: 62 (!) 59  Resp: 13 16  Temp:    SpO2: 96% 96%    Last Pain:  Vitals:   11/28/18 0945  TempSrc:   PainSc: 2                  Esther Broyles A.

## 2018-11-30 ENCOUNTER — Encounter (HOSPITAL_BASED_OUTPATIENT_CLINIC_OR_DEPARTMENT_OTHER): Payer: Self-pay | Admitting: Urology

## 2018-12-11 ENCOUNTER — Telehealth: Payer: Self-pay | Admitting: *Deleted

## 2018-12-11 NOTE — Telephone Encounter (Signed)
14 day ZIO XT long term holter monitor to be mailed to the patients home.  Instructions reviewed briefly as they are included in the monitor kit.  Patient aware he is to start 14 day monitor on or after 12/30/18.

## 2018-12-28 ENCOUNTER — Telehealth: Payer: Self-pay | Admitting: Internal Medicine

## 2018-12-28 NOTE — Telephone Encounter (Signed)
Lm to call back ./cy 

## 2018-12-28 NOTE — Telephone Encounter (Signed)
New Message   Patient states that he needs a refill on his medication, but doesn't know which medication. States that it is a blue capsule with RG84 and he believe it is his heart medication. Please give patient a call back to assist.

## 2018-12-28 NOTE — Telephone Encounter (Signed)
Pt calling needing refill on med not sure of med states it is a blue capsule Pt had been on Diltiazem and this is not on list at this time Also pt wanting to know if needs to resume Eliquis Will forward to Dr Harrington Challenger for review .Adonis Housekeeper

## 2018-12-28 NOTE — Telephone Encounter (Signed)
He had been taken off diltiazem (that is blue capsule ) last fall because of dizziness   His dizziness improved with stopping He should be on ELiquis if he tolerated   He has had a urologic issues   Would restart if stopped   Watch for bleeding

## 2018-12-28 NOTE — Telephone Encounter (Signed)
Spoke with pt and will restart Eliquis Per Dr Harrington Challenger recommendations. Pt has this med available .Adonis Housekeeper

## 2019-01-03 ENCOUNTER — Ambulatory Visit (INDEPENDENT_AMBULATORY_CARE_PROVIDER_SITE_OTHER): Payer: Medicare Other

## 2019-01-03 DIAGNOSIS — I480 Paroxysmal atrial fibrillation: Secondary | ICD-10-CM | POA: Diagnosis not present

## 2019-01-03 DIAGNOSIS — R42 Dizziness and giddiness: Secondary | ICD-10-CM | POA: Diagnosis not present

## 2019-01-11 ENCOUNTER — Ambulatory Visit (INDEPENDENT_AMBULATORY_CARE_PROVIDER_SITE_OTHER): Payer: Medicare Other | Admitting: Podiatry

## 2019-01-11 ENCOUNTER — Other Ambulatory Visit: Payer: Self-pay

## 2019-01-11 ENCOUNTER — Encounter: Payer: Self-pay | Admitting: Podiatry

## 2019-01-11 VITALS — BP 115/79 | HR 86 | Temp 97.0°F | Resp 16

## 2019-01-11 DIAGNOSIS — B353 Tinea pedis: Secondary | ICD-10-CM

## 2019-01-11 DIAGNOSIS — L603 Nail dystrophy: Secondary | ICD-10-CM | POA: Diagnosis not present

## 2019-01-11 MED ORDER — TERBINAFINE HCL 250 MG PO TABS: 250.0000 mg | ORAL_TABLET | Freq: Every day | ORAL | 0 refills | Status: DC

## 2019-01-11 NOTE — Progress Notes (Signed)
Subjective:  Patient ID: Marvin Gill, male    DOB: 1950-11-27,  MRN: [id number] HPI Chief Complaint  Patient presents with  . Nail Problem    Toenails bilateral - concerned about thick, yellow toenails  . Skin Problem    Plantar skin bilateral - skin peeling - tried fungal spray  . New Patient (Initial Visit)    68 y.o. male presents with the above complaint.   ROS: He denies fever chills nausea vomiting muscle aches pains calf pain back pain chest pain shortness of breath.  Past Medical History:  Diagnosis Date  . Aneurysm of abdominal aorta (HCC)    a. ultasound 08-04-16 --- 3.1 CM X 3.1 CM - sees Dr. Laurann Montana - F/u due 2021.  Marland Kitchen Anxiety   . Benign localized prostatic hyperplasia with lower urinary tract symptoms (LUTS)   . Bladder cancer [facility]) urologsit-  dr winter   BCG treatment's   . COPD with emphysema ([place])   . Coronary artery disease primary cardiologist-- dr Arville Lime   a. s/p PTCA to dLAD 2000. b. Normal coronaries in 2003, patent prior PTCA site.  . Dizziness    intermittant  . Dysrhythmia 2019   AF-Eliquis started  . Dysuria   . Feeling light headed    intermittant  . Fibromyalgia   . GERD (gastroesophageal reflux disease)   . History of kidney stones   . History of skin cancer   . Hypothyroidism   . OA (osteoarthritis)    back, shoulders , hips  . PAF (paroxysmal atrial fibrillation) ([place]) 03/05/2018   dx in setting sepsis---  cardiologist-- dr Arville Lime (per dr Denman George note in epic pt was to stop eliquis due to hematuria last dose 09-05-2018)  . PONV (postoperative nausea and vomiting)    NAUSEA ALL DAY AFTER 02-01-17 SURGERY, PT WANTS ANESTHESIA LIKE 06-23-15 SURGERY WITH DR  [name]  . S/P percutaneous transluminal coronary angioplasty    05-18-1999  to dLAD  . Wears glasses    Past Surgical History:  Procedure Laterality Date  . CARDIAC CATHETERIZATION  06-14-2001  dr Einar Gip   normal coronary arteries and patent previous dLAD   .  CARDIOVASCULAR STRESS TEST  06/12/2008   Low risk nuclear study w/ no evidence ischemia/  normal LV funciton and wall motion , ef 60%  . COLONOSCOPY WITH PROPOFOL N/A 12/13/2013   Procedure: COLONOSCOPY WITH PROPOFOL;  Surgeon: Garlan Fair, MD;  [facility];  Service: Endoscopy;  Laterality: N/A;  . CORONARY ANGIOPLASTY  05-18-1999  dr Tami Ribas   PTCA balloon to dLAD 80% (single vessel disease),  ef 60%  . CYSTOSCOPY WITH FULGERATION N/A 08/15/2017   Procedure: CYSTOSCOP/ BLADDER BIOPSY/TURBT;  Surgeon: Nickie Retort, MD;  Location: [facility];  Service: Urology;  Laterality: N/A;  . CYSTOSCOPY WITH INSERTION OF UROLIFT N/A 06/23/2015   Procedure: CYSTOSCOPY WITH INSERTION OF UROLIFT;  Surgeon: Carolan Clines, MD;  [facility];  Service: Urology;  Laterality: N/A;  . CYSTOSCOPY WITH STENT PLACEMENT Left 04/21/2018   Procedure: CYSTOSCOPY WITH STENT PLACEMENT;  Surgeon: Bjorn Loser, MD;  Location: [place] [facility];  Service: Urology;  Laterality: Left;  Emergent case  . CYSTOSCOPY WITH URETEROSCOPY AND STENT PLACEMENT Left 04/07/2018   Procedure: CYSTOSCOPY WITH URETEROSCOPY AND STENT EXCHANGE/ BLADDER BIOPSY/LASER ABLATION OF URETERAL TUMOR;  Surgeon: Ceasar Mons, MD;  Location: [facility];  Service: Urology;  Laterality: Left;  ONLY NEEDS 60 MIN  . CYSTOSCOPY WITH URETEROSCOPY AND STENT PLACEMENT  Left 11/28/2018   Procedure: CYSTOSCOPY WITH URETEROSCOPY , LEFT URETERAL AND BLADDER BIOPSY, LEFT STENT PLACEMENT;  Surgeon: Ceasar Mons, MD;  Location: [facility];  Service: Urology;  Laterality: Left;  . CYSTOSCOPY/URETEROSCOPY/HOLMIUM LASER/STENT PLACEMENT Bilateral 02/01/2017   Procedure: CYSTOSCOPY/URETEROSCOPY/HOLMIUM LASER/STENT PLACEMENT,STONE BASKETRY, BLADDER BIOPSY;  Surgeon: Nickie Retort, MD;  Location: [facility];  Service: Urology;  Laterality: Bilateral;  .  ESOPHAGOGASTRODUODENOSCOPY  08-24-2006  . TRANSTHORACIC ECHOCARDIOGRAM  03/06/2018   mild LVH,  ef 55-60%,  pt in atrial fib. LV diastolic not evaluated/  mild LAE and RAE/  moderate AV calcification with sclerosis , no stenosis or regurg./  mild dilated aortic root, 47mm/  trival MR and TR  . TRANSURETHRAL INCISION OF PROSTATE N/A 04/01/2014   Procedure: TRANSURETHRAL INCISION OF THE PROSTATE (TUIP);  Surgeon: Ailene Rud, MD;  Location: [facility];  Service: Urology;  Laterality: N/A;    Current Outpatient Medications:  .  ALPRAZolam (XANAX) 1 MG tablet, Take 1 mg by mouth 2 (two) times daily. , Disp: , Rfl:  .  apixaban (ELIQUIS) 5 MG TABS tablet, Take 5 mg by mouth 2 (two) times daily., Disp: , Rfl:  .  DULoxetine (CYMBALTA) 20 MG capsule, TK 1 C PO QD, Disp: , Rfl:  .  esomeprazole (NEXIUM) 40 MG capsule, Take 40 mg by mouth daily. , Disp: , Rfl:  .  gabapentin (NEURONTIN) 800 MG tablet, Take 800 mg by mouth 3 (three) times daily. , Disp: , Rfl: 3 .  HYDROcodone-acetaminophen (NORCO) 5-325 MG tablet, Take 1 tablet by mouth every 4 (four) hours as needed for moderate pain., Disp: 20 tablet, Rfl: 0 .  levothyroxine (SYNTHROID, LEVOTHROID) 50 MCG tablet, Take 1 tablet (50 mcg total) by mouth daily before breakfast. (Patient taking differently: Take 50 mcg by mouth daily before breakfast. ), Disp: 30 tablet, Rfl: 0 .  oxybutynin (DITROPAN) 5 MG tablet, Take 1 tablet (5 mg total) by mouth every 8 (eight) hours as needed for bladder spasms., Disp: 30 tablet, Rfl: 1 .  phenazopyridine (PYRIDIUM) 200 MG tablet, Take 1 tablet (200 mg total) by mouth 3 (three) times daily as needed (for pain with urination)., Disp: 30 tablet, Rfl: 0 .  tamsulosin (FLOMAX) 0.4 MG CAPS capsule, Take 0.4 mg by mouth every evening. , Disp: , Rfl: 9 .  terbinafine (LAMISIL) 250 MG tablet, Take 1 tablet (250 mg total) by mouth daily., Disp: 30 tablet, Rfl: 0 .  valACYclovir (VALTREX) 1000 MG tablet,  Take 1,000 mg by mouth 2 (two) times daily as needed. X 7 DAYS, THEN 500 MG BID X 6 MONTHS, Disp: , Rfl:  .  vitamin B-12 (CYANOCOBALAMIN) 1000 MCG tablet, Take 1,000 mcg by mouth daily., Disp: , Rfl:   Allergies  Allergen Reactions  . Codeine Nausea And Vomiting    "made my head feel funny too"   Review of Systems Objective:   Vitals:   01/11/19 1417  BP: 115/79  Pulse: 86  Resp: 16  Temp: (!) 97 F (36.1 C)    General: Well developed, nourished, in no acute distress, alert and oriented x3   Dermatological: Skin is warm, dry and supple bilateral. Nails x 10 are well maintained; remaining integument appears unremarkable at this time. There are no open sores, no preulcerative lesions, no rash or signs of infection present.  Toenails are thick yellow dystrophic clinically mycotic subungual debris present.  He also has dry scaly peeling skin with areas of  erythema and petechial type lesions.  Vascular: Dorsalis Pedis artery and Posterior Tibial artery pedal pulses are 2/4 bilateral with immedate capillary fill time. Pedal hair growth present. No varicosities and no lower extremity edema present bilateral.   Neruologic: Grossly intact via light touch bilateral. Vibratory intact via tuning fork bilateral. Protective threshold with Semmes Wienstein monofilament intact to all pedal sites bilateral. Patellar and Achilles deep tendon reflexes 2+ bilateral. No Babinski or clonus noted bilateral.   Musculoskeletal: No gross boney pedal deformities bilateral. No pain, crepitus, or limitation noted with foot and ankle range of motion bilateral. Muscular strength 5/5 in all groups tested bilateral.  Gait: Unassisted, Nonantalgic.    Radiographs:  None taken  Assessment & Plan:   Assessment: Tinea pedis and probable onychomycosis  Plan: Samples of the skin and nail were taken today for laboratory evaluation.  Went ahead and started him on Lamisil 240 mg tablets 1 p.o. daily follow-up with  him in 1 month      T. [place], [place]

## 2019-01-12 ENCOUNTER — Telehealth: Payer: Self-pay

## 2019-01-12 ENCOUNTER — Telehealth: Payer: Self-pay | Admitting: Podiatry

## 2019-01-12 NOTE — Telephone Encounter (Signed)
Marvin Gill, would you be able to call this patient please. Patient is having an reaction to the Lamisil he started taking yesterday.

## 2019-01-12 NOTE — Telephone Encounter (Signed)
Pt was prescribed Lamisil yesterday and states that the medication is making him very itchy. Pt would like to know if there is anything that can help with the itching and what he should do in the meantime.  Please give patient a call.

## 2019-01-12 NOTE — Telephone Encounter (Signed)
Patient called to report that after only one dose of Lamisil, he has broken out in a rash and itches all over. Spoke to patient and advised not to take anymore of the medication, and that he can take OTC Benadryl if the itching does not subside. Patient reports that he is not having trouble breathing or swelling of his throat. Advised that we will not be sending in a different medication at this time.

## 2019-01-15 NOTE — Telephone Encounter (Signed)
Nira Conn called patient on Friday. Lattie Haw

## 2019-01-16 ENCOUNTER — Telehealth: Payer: Self-pay | Admitting: *Deleted

## 2019-01-16 NOTE — Telephone Encounter (Signed)
Pt called states he called 01/12/2019 because of a rash from the terbinafine and no one has called him back with an alternate medication.

## 2019-01-17 MED ORDER — ITRACONAZOLE 200 MG PO TABS: 1.0000 | ORAL_TABLET | Freq: Every day | ORAL | 0 refills | Status: DC

## 2019-01-17 NOTE — Telephone Encounter (Signed)
I informed pt of Dr. Stephenie Acres rx Itraconazole for 30 days.

## 2019-01-17 NOTE — Telephone Encounter (Signed)
Yes then I will follow up with him in a month

## 2019-01-17 NOTE — Telephone Encounter (Signed)
I informed pt of Dr. Milinda Pointer alternate orders and that I would check to see if he was to take just 30 days.

## 2019-01-17 NOTE — Telephone Encounter (Signed)
Itraconazole is the only oral alternative 200mg  qd.

## 2019-01-19 ENCOUNTER — Telehealth: Payer: Self-pay | Admitting: *Deleted

## 2019-01-19 NOTE — Telephone Encounter (Signed)
Pt called states his doctor said the new medication from Wednesday, would interfere with Alprazolam and Esomeprazole.

## 2019-01-22 NOTE — Telephone Encounter (Signed)
Pt calling for medication for his athletes foot.

## 2019-01-23 NOTE — Telephone Encounter (Signed)
Pt called about the prescription.

## 2019-01-23 NOTE — Telephone Encounter (Signed)
Left message informing pt Dr. Milinda Pointer had responded to his question and I would discuss when he called again.

## 2019-01-23 NOTE — Telephone Encounter (Signed)
Very low risk for reaction between these. Otherwise there is nothing for the nails and he could try naftin cream 1% for tinea.

## 2019-01-23 NOTE — Telephone Encounter (Signed)
I left message informing pt that due to our missing each other's calls so many times I would leave Dr. Stephenie Acres statement, I informed pt of Dr. Stephenie Acres  01/23/2019 6:57am statement, and request a call back with his decision.

## 2019-01-24 NOTE — Telephone Encounter (Signed)
I informed pt that I got his message concerning the itraconazole and that it had been called to the [facility] on the day it was ordered and should be there.

## 2019-01-24 NOTE — Telephone Encounter (Signed)
Pt called and states he got my message and if Dr. Milinda Pointer didn't think there would be a problem he would take the last medication called in, because it would work faster than the cream.

## 2019-01-25 ENCOUNTER — Telehealth: Payer: Self-pay | Admitting: *Deleted

## 2019-01-25 NOTE — Telephone Encounter (Signed)
Pt states the pharmacy needs the medication called in.

## 2019-01-25 NOTE — Telephone Encounter (Signed)
I spoke with [facility] - [facility] [phone] and informed that Dr. Milinda Pointer had given information to pt stating the risk was low for interaction with the 2 medications and pt would like to take the itraconazole. [facility] states understanding and will fax the PA to our office.

## 2019-01-31 ENCOUNTER — Telehealth: Payer: Self-pay | Admitting: *Deleted

## 2019-01-31 MED ORDER — NAFTIFINE HCL 1 % EX CREA: TOPICAL_CREAM | Freq: Every day | CUTANEOUS | 5 refills | Status: DC

## 2019-01-31 NOTE — Telephone Encounter (Addendum)
Pt states he has not heard anything concerning the Itraconazole pre-cert, and would like the cream sent to the pharmacy. Pt states he can be called tomorrow.

## 2019-02-02 ENCOUNTER — Telehealth: Payer: Self-pay | Admitting: Internal Medicine

## 2019-02-02 NOTE — Telephone Encounter (Signed)
Notes recorded by Fay Records, MD on 01/22/2019 at 11:23 PM EDT  Holter monitor showed a short burst of SVT Self limited PT did not sensd  Otherwise no arrythmias  Could try low dose toprol XL 25 mg  See if feels better  I spoke with pt and reviewed above monitor results with him. He reports he is "feeling pretty good."  Urinary issues have resolved. Has some dizziness at times when changing positions. Has shortness of breath after he works out in the yard for [employer].  I talked with him about possibly starting Toprol . He would like to wait and discuss at upcoming appointment on September 14,2020 unless Dr. Harrington Challenger feels he needs to start it before then.

## 2019-02-02 NOTE — Telephone Encounter (Signed)
Patient called to go over the results of his long term monitor. Please call

## 2019-02-08 ENCOUNTER — Telehealth: Payer: Self-pay | Admitting: *Deleted

## 2019-02-08 NOTE — Telephone Encounter (Signed)
Pt states he is using the cream but he would prefer the pills, the pharmacy recommended calling [phone] for the PA.

## 2019-02-09 NOTE — Progress Notes (Signed)
Cardiology Office Note   Date:  02/12/2019   ID:  Marvin Gill, DOB 04-Mar-1951, MRN [id number]  PCP:  Lavone Orn, MD  Cardiologist:   Dorris Carnes, MD   F/U of CAD    History of Present Illness: Marvin Gill is a 67 y.o. male with a history of CAD (s/p PTCA of LAD in 2000; last cath in 2003 normal coronary arteries , patent PTCA site), AAA, GERD, atrial fibrillation (in setting of sepsis), urosepsis.  Echo LVEF 55 to 60%^    Converted to SR   I saw the pt in Jan 2020 The pt deneis CP  Breathing is OK   No dizziness  No SOB   No palpitations     Current Meds  Medication Sig  . ALPRAZolam (XANAX) 1 MG tablet Take 1 mg by mouth 2 (two) times daily.   . DULoxetine (CYMBALTA) 20 MG capsule TK 1 C PO QD  . esomeprazole (NEXIUM) 40 MG capsule Take 40 mg by mouth daily.   Marland Kitchen gabapentin (NEURONTIN) 800 MG tablet Take 800 mg by mouth 3 (three) times daily.   . Itraconazole 200 MG TABS Take 1 tablet by mouth daily.  Marland Kitchen levothyroxine (SYNTHROID, LEVOTHROID) 50 MCG tablet Take 1 tablet (50 mcg total) by mouth daily before breakfast. (Patient taking differently: Take 50 mcg by mouth daily before breakfast. )  . naftifine (NAFTIN) 1 % cream Apply topically daily.  . tamsulosin (FLOMAX) 0.4 MG CAPS capsule Take 0.4 mg by mouth every evening.   . valACYclovir (VALTREX) 1000 MG tablet Take 1,000 mg by mouth 2 (two) times daily as needed. X 7 DAYS, THEN 500 MG BID X 6 MONTHS  . vitamin B-12 (CYANOCOBALAMIN) 1000 MCG tablet Take 1,000 mcg by mouth daily.     Allergies:   Codeine   Past Medical History:  Diagnosis Date  . Aneurysm of abdominal aorta (HCC)    a. ultasound 08-04-16 --- 3.1 CM X 3.1 CM - sees Dr. Laurann Montana - F/u due 2021.  Marland Kitchen Anxiety   . Benign localized prostatic hyperplasia with lower urinary tract symptoms (LUTS)   . Bladder cancer [facility]) urologsit-  dr winter   BCG treatment's   . COPD with emphysema ([place])   . Coronary artery disease primary cardiologist-- dr Arville Lime    a. s/p PTCA to dLAD 2000. b. Normal coronaries in 2003, patent prior PTCA site.  . Dizziness    intermittant  . Dysrhythmia 2019   AF-Eliquis started  . Dysuria   . Feeling light headed    intermittant  . Fibromyalgia   . GERD (gastroesophageal reflux disease)   . History of kidney stones   . History of skin cancer   . Hypothyroidism   . OA (osteoarthritis)    back, shoulders , hips  . PAF (paroxysmal atrial fibrillation) ([place]) 03/05/2018   dx in setting sepsis---  cardiologist-- dr Arville Lime (per dr Denman George note in epic pt was to stop eliquis due to hematuria last dose 09-05-2018)  . PONV (postoperative nausea and vomiting)    NAUSEA ALL DAY AFTER 02-01-17 SURGERY, PT WANTS ANESTHESIA LIKE 06-23-15 SURGERY WITH DR  [name]  . S/P percutaneous transluminal coronary angioplasty    05-18-1999  to dLAD  . Wears glasses     Past Surgical History:  Procedure Laterality Date  . CARDIAC CATHETERIZATION  06-14-2001  dr Einar Gip   normal coronary arteries and patent previous dLAD   . CARDIOVASCULAR STRESS TEST  06/12/2008  Low risk nuclear study w/ no evidence ischemia/  normal LV funciton and wall motion , ef 60%  . COLONOSCOPY WITH PROPOFOL N/A 12/13/2013   Procedure: COLONOSCOPY WITH PROPOFOL;  Surgeon: Garlan Fair, MD;  [facility];  Service: Endoscopy;  Laterality: N/A;  . CORONARY ANGIOPLASTY  05-18-1999  dr Tami Ribas   PTCA balloon to dLAD 80% (single vessel disease),  ef 60%  . CYSTOSCOPY WITH FULGERATION N/A 08/15/2017   Procedure: CYSTOSCOP/ BLADDER BIOPSY/TURBT;  Surgeon: Nickie Retort, MD;  Location: [facility];  Service: Urology;  Laterality: N/A;  . CYSTOSCOPY WITH INSERTION OF UROLIFT N/A 06/23/2015   Procedure: CYSTOSCOPY WITH INSERTION OF UROLIFT;  Surgeon: Carolan Clines, MD;  [facility];  Service: Urology;  Laterality: N/A;  . CYSTOSCOPY WITH STENT PLACEMENT Left 04/21/2018   Procedure: CYSTOSCOPY WITH STENT PLACEMENT;   Surgeon: Bjorn Loser, MD;  Location: [place];  Service: Urology;  Laterality: Left;  Emergent case  . CYSTOSCOPY WITH URETEROSCOPY AND STENT PLACEMENT Left 04/07/2018   Procedure: CYSTOSCOPY WITH URETEROSCOPY AND STENT EXCHANGE/ BLADDER BIOPSY/LASER ABLATION OF URETERAL TUMOR;  Surgeon: Ceasar Mons, MD;  Location: [facility];  Service: Urology;  Laterality: Left;  ONLY NEEDS 60 MIN  . CYSTOSCOPY WITH URETEROSCOPY AND STENT PLACEMENT Left 11/28/2018   Procedure: CYSTOSCOPY WITH URETEROSCOPY , LEFT URETERAL AND BLADDER BIOPSY, LEFT STENT PLACEMENT;  Surgeon: Ceasar Mons, MD;  Location: [facility];  Service: Urology;  Laterality: Left;  . CYSTOSCOPY/URETEROSCOPY/HOLMIUM LASER/STENT PLACEMENT Bilateral 02/01/2017   Procedure: CYSTOSCOPY/URETEROSCOPY/HOLMIUM LASER/STENT PLACEMENT,STONE BASKETRY, BLADDER BIOPSY;  Surgeon: Nickie Retort, MD;  Location: [facility];  Service: Urology;  Laterality: Bilateral;  . ESOPHAGOGASTRODUODENOSCOPY  08-24-2006  . TRANSTHORACIC ECHOCARDIOGRAM  03/06/2018   mild LVH,  ef 55-60%,  pt in atrial fib. LV diastolic not evaluated/  mild LAE and RAE/  moderate AV calcification with sclerosis , no stenosis or regurg./  mild dilated aortic root, 38mm/  trival MR and TR  . TRANSURETHRAL INCISION OF PROSTATE N/A 04/01/2014   Procedure: TRANSURETHRAL INCISION OF THE PROSTATE (TUIP);  Surgeon: Ailene Rud, MD;  Location: [facility];  Service: Urology;  Laterality: N/A;     Social History:  The patient  reports that he quit smoking about 15 years ago. His smoking use included cigarettes. He has a 25.00 pack-year smoking history. He has never used smokeless tobacco. He reports that he does not drink alcohol or use drugs.   Family History:  The patient's family history includes Anuerysm in his mother.    ROS:  Please see the history of present illness. All other systems are  reviewed and  Negative to the above problem except as noted.    PHYSICAL EXAM: VS:  BP 130/84   Pulse 69   Ht 6\' 6"  (1.981 m)   Wt 225 lb (102.1 kg)   SpO2 95%   BMI 26.00 kg/m   GEN: Well nourished, well developed, in no acute distress  HEENT: normal  Neck: JVP is normal    NO carotid bruits, or masses Cardiac: RRR; no murmurs, rubs, or gallops,no edema  Respiratory:  clear to auscultation bilaterally, normal work of breathing GI: soft, nontender, nondistended, + BS  No hepatomegaly  MS: no deformity Moving all extremities   Skin: warm and dry, no rash Neuro:  Strength and sensation are intact Psych: euthymic mood, full affect   EKG:  EKG is not ordered today.   Lipid  Panel    Component Value Date/Time   CHOL 176 06/26/2018 1647   TRIG 221 (H) 06/26/2018 1647   HDL 49 06/26/2018 1647   CHOLHDL 3.6 06/26/2018 1647   CHOLHDL 4.5 05/10/2007 0305   VLDL 17 05/10/2007 0305   LDLCALC 83 06/26/2018 1647      Wt Readings from Last 3 Encounters:  02/12/19 225 lb (102.1 kg)  11/28/18 210 lb 4.8 oz (95.4 kg)  06/26/18 219 lb 12.8 oz (99.7 kg)      ASSESSMENT AND PLAN:   CAD  Remains symptom free   Will continue meds     2  PAF  Clinically in sinus rhythm   Review of records for afib  IF occurred only with sepsis need to eval use of anticoagulation  3  HL  COntniue statin  Check lipids today    4  Urology   Follow with urol for bladder Ca      F/U clnic in 1 year    Current medicines are reviewed at length with the patient today.  The patient does not have concerns regarding medicines.  Signed, Dorris Carnes, MD  02/12/2019 8:43 AM    [address], [place], [place]  [address] Phone: [phone]; Fax: [phone]

## 2019-02-12 ENCOUNTER — Other Ambulatory Visit: Payer: Self-pay

## 2019-02-12 ENCOUNTER — Encounter: Payer: Self-pay | Admitting: Internal Medicine

## 2019-02-12 ENCOUNTER — Ambulatory Visit (INDEPENDENT_AMBULATORY_CARE_PROVIDER_SITE_OTHER): Payer: Medicare Other | Admitting: Internal Medicine

## 2019-02-12 VITALS — BP 130/84 | HR 69 | Ht 78.0 in | Wt 225.0 lb

## 2019-02-12 DIAGNOSIS — E785 Hyperlipidemia, unspecified: Secondary | ICD-10-CM | POA: Diagnosis not present

## 2019-02-12 LAB — LIPID PANEL
Chol/HDL Ratio: 4.5 ratio (ref 0.0–5.0)
Cholesterol, Total: 178 mg/dL (ref 100–199)
HDL: 40 mg/dL (ref >39)
LDL Chol Calc (NIH): 115 mg/dL — ABNORMAL HIGH (ref 0–99)
Triglycerides: 126 mg/dL (ref 0–149)
VLDL Cholesterol Cal: 23 mg/dL (ref 5–40)

## 2019-02-12 NOTE — Patient Instructions (Signed)
Medication Instructions:  No changes If you need a refill on your cardiac medications before your next appointment, please call your pharmacy.   Lab work: Today: lipids If you have labs (blood work) drawn today and your tests are completely normal, you will receive your results only by: Marland Kitchen MyChart Message (if you have MyChart) OR . A paper copy in the mail If you have any lab test that is abnormal or we need to change your treatment, we will call you to review the results.  Testing/Procedures: none  Follow-Up: At [facility], you and your health needs are our priority.  As part of our continuing mission to provide you with exceptional heart care, we have created designated Provider Care Teams.  These Care Teams include your primary Cardiologist (physician) and Advanced Practice Providers (APPs -  Physician Assistants and Nurse Practitioners) who all work together to provide you with the care you need, when you need it. You will need a follow up appointment in:  12 months.  Please call our office 2 months in advance to schedule this appointment.  You may see Dorris Carnes, MD or one of the following Advanced Practice Providers on your designated Care Team: Richardson Dopp, PA-C [place], [place] . Daune Perch, NP  Any Other Special Instructions Will Be Listed Below (If Applicable).

## 2019-02-15 ENCOUNTER — Encounter: Payer: Self-pay | Admitting: Podiatry

## 2019-02-15 ENCOUNTER — Ambulatory Visit (INDEPENDENT_AMBULATORY_CARE_PROVIDER_SITE_OTHER): Payer: Medicare Other | Admitting: Podiatry

## 2019-02-15 DIAGNOSIS — B353 Tinea pedis: Secondary | ICD-10-CM

## 2019-02-15 DIAGNOSIS — L603 Nail dystrophy: Secondary | ICD-10-CM

## 2019-02-15 MED ORDER — FLUCONAZOLE 150 MG PO TABS: 300.0000 mg | ORAL_TABLET | ORAL | 0 refills | Status: DC

## 2019-02-15 NOTE — Progress Notes (Signed)
Called patient prior to 10:45 appointment today - discussed labs and medication changes. Advised patient he did not need to come in for this appointment today.   Lamisil was initially prescribed for tinea and onychomycosis, however, after the first couple of doses patient developed an itchy rash. We discontinued the lamisil and called in itraconazole. Patient's insurance required a prior authorization and we are still working on getting the itraconazole approved. In the meantime, Dr. Milinda Pointer wanted patient to take an alternative medication that insurance may possibly cover. I sent in fluconazole tablets #24, for patient to take 300 mg once a week, to [facility] in [place].  I discussed treatment and instructions for the new medication with the patient. Dr. Milinda Pointer will see him back in 3 months. He will be notified of any changes on the prior authorization for the itrazconazole.

## 2019-03-23 ENCOUNTER — Other Ambulatory Visit: Payer: Self-pay | Admitting: Internal Medicine

## 2019-03-23 ENCOUNTER — Telehealth: Payer: Self-pay | Admitting: Internal Medicine

## 2019-03-23 MED ORDER — APIXABAN 5 MG PO TABS: 5.0000 mg | ORAL_TABLET | Freq: Two times a day (BID) | ORAL | 5 refills | Status: DC

## 2019-03-23 NOTE — Telephone Encounter (Signed)
°*  STAT* If patient is at the pharmacy, call can be transferred to refill team.   1. Which medications need to be refilled? (please list name of each medication and dose if known) Eliquis   2. Which pharmacy/location (including street and city if local pharmacy) is medication to be sent to? [facility] [address]   3. Do they need a 30 day or 90 day supply? Not sure

## 2019-03-23 NOTE — Telephone Encounter (Signed)
Last OV 02/12/2019 68 years old 102 kg Scr 1.1

## 2019-03-23 NOTE — Telephone Encounter (Signed)
Returned call to Pt.  Per Pt he believes Dr. Harrington Challenger was going to speak to the pharmacist after his last OV on 02/12/2019 and then call him back with additional medication instructions.  States he hasn't heard anything back.  Per last monitor results-Pt with some SVT and Dr. Harrington Challenger was considering starting Toprol XL but Pt wanted to discuss at 9/14 appt so this prescription was never started.  Pt states he continues to have intermittent dizziness.   Please advise regarding medication regimen

## 2019-03-23 NOTE — Telephone Encounter (Signed)
Patient is calling about some medication that Dr. Harrington Challenger was to call into the pharmacy for him.  He thinks one of them was Eliquis, he is not sure of the other one.

## 2019-05-10 NOTE — Telephone Encounter (Signed)
Follow up  Patient following up on possible  medication changes discussed in September

## 2019-05-16 ENCOUNTER — Telehealth: Payer: Self-pay | Admitting: Internal Medicine

## 2019-05-16 NOTE — Telephone Encounter (Signed)
I spoke to the patient who called wondering what Dr Harrington Challenger' recommendation would be on his Eliquis, Toprol and a Statin.  He said that he will restart Eliquis 5 mg bid and was wondering the dose of Metoprolol Succinate (Toprol) and a statin.  He has not experienced dizziness.  He said that he had tried Crestor in the past.  Please advise, thank you.

## 2019-05-16 NOTE — Telephone Encounter (Signed)
Patient is calling back in regards to results. He states a detailed message can be left if you are not able to get a hold of him.

## 2019-05-16 NOTE — Telephone Encounter (Signed)
Spoke to patient  Recommend Crestor 10 mg   He has just started Eliquis 5 bid   Reviewed recomm given PAF, risk of stroke     Given that he is feeling good, denies palpitations I would not recomm Toprol now  He has had dizziness past Set follow up for later this spring   March/APril

## 2019-05-17 MED ORDER — ROSUVASTATIN CALCIUM 10 MG PO TABS: 10.0000 mg | ORAL_TABLET | Freq: Every day | ORAL | 3 refills | Status: DC

## 2019-05-17 NOTE — Telephone Encounter (Signed)
See telephone encounter 05/16/19.

## 2019-05-21 ENCOUNTER — Telehealth: Payer: Self-pay | Admitting: Internal Medicine

## 2019-05-21 DIAGNOSIS — E785 Hyperlipidemia, unspecified: Secondary | ICD-10-CM

## 2019-05-21 NOTE — Telephone Encounter (Signed)
Fay Records, MD  05/15/2019 4:57 PM EST    Tried to contact patient. Unavailable Left brief voice mail. Firm up meds  Spoke to urology. No evid or expectation for bleeding OK for anticoagulation. Please confirm Refill on Eliquis was made but ? If taking  Also- If still dizzy I would not add Toprol Re lipids  LDL is still too high  What statins has he tried in past?  ---------------------------------------------------------------------------------------------------------------

## 2019-05-21 NOTE — Telephone Encounter (Signed)
Patient returning Michalene's call in regards to lab results.

## 2019-05-21 NOTE — Telephone Encounter (Signed)
I spoke with the patient. He has had only one episode of dizziness.  Thinks it was that he got out of bed too quickly.  It passed after a few seconds.  He is going to stay off Toprol.  He is taking eliquis BID.   He will restart Crestor 10 mg daily. He states PCP put him on it a while back and then told him he really didn't need it so he stopped.  Pt will restart.  Aware there is a new prescription for Crestor at [facility] for him.  Adv if he is tolerating we will plan to check his lipids in 3 months. He does not recall taking other statins.    Will route updates to Dr. Harrington Challenger.

## 2019-06-25 ENCOUNTER — Telehealth: Payer: Self-pay | Admitting: Internal Medicine

## 2019-06-25 NOTE — Telephone Encounter (Signed)
Agree  with recomm that he needs to contact urology   Noted appt with Kathleen Argue to review

## 2019-06-25 NOTE — Telephone Encounter (Signed)
Patient states that when he went back on Eliquis after having a procedure he started to have blood in his urine so he stopped taking it. Patient states that he started back on Eliquis 2 weeks later and the blood in his urine returned so he stopped taking it again. Patient states that he also gets dizzy when he stands up quickly. Advised him to stand up slowly and to make sure he is staying hydrated. He states that his blood pressure has been good, last reading the other day was 130/80. I advised him that he needs to make his urologist aware of the blood in his urine. He states that he has also been having burning when he urinates and that he has an appointment to have an ultrasound done this Friday. Patient also has an appointment to see Richardson Dopp, PA-C on Wednesday.

## 2019-06-25 NOTE — Telephone Encounter (Signed)
Left message for patient to call back  

## 2019-06-25 NOTE — Telephone Encounter (Signed)
Pt c/o medication issue:  1. Name of Medication: apixaban (ELIQUIS) 5 MG TABS tablet  2. How are you currently taking this medication (dosage and times per day)? Stopped taking it   3. Are you having a reaction (difficulty breathing--STAT)? yes  4. What is your medication issue? Bleeding when urinating, dizziness and lightheaded  Patient has an appointment with Richardson Dopp on Wednesday 06/27/19 at 11:15am

## 2019-06-26 NOTE — Progress Notes (Deleted)
Cardiology Office Note:    Date:  06/26/2019   ID:  Marvin Gill, DOB 04-24-1951, MRN [id number]  PCP:  Lavone Orn, MD  Cardiologist:  Dorris Carnes, MD *** Electrophysiologist:  None   Referring MD: Lavone Orn, MD   No chief complaint on file. ***  History of Present Illness:    Marvin Gill is a 69 y.o. male with:   Coronary artery disease   S/p POBA to LAD in 2000  Abdominal aortic aneurysm   GERD  Paroxysmal Atrial fibrillation   Occurred in setting of urosepsis 10.2019  CHA2DS2-VASc=2 (age x 1, CAD) >> Apixaban    COPD  Hx of Bladder CA  BPH  Marvin Gill was last seen by Dr. Harrington Challenger in 01/2019.  He called in recently with recurrent hematuria after a recent bladder procedure.    The DICTATELATER SmartLink is not supported in this context. ***   Prior CV studies:   The following studies were reviewed today:  Cardiac Monitor 01/18/2019 Sinus bradycardia, sinus tachycardia Short burst SVT  Self limited  NO symptoms recorded   Cardiac monitor 09/21/2018 Sinus rhythm with few short bursts of PSVT--longest 11 beats.   Heart rates 47 to 184 bpm   Average HR 68 bpm  Echocardiogram 03/06/2018 Mild LVH, EF 55-60, aortic sclerosis without stenosis, mildly dilated aortic root (37 mm), mild MAC, trivial MR, mild LAE, normal RV SF, mild RAE, PASP 20  Myoview 06/12/2008 Extent, no ischemia; low risk  Cardiac catheterization 06/14/2001 RCA patent Left main patent LCx patent RI patent LAD patent   Past Medical History:  Diagnosis Date  . Aneurysm of abdominal aorta (HCC)    a. ultasound 08-04-16 --- 3.1 CM X 3.1 CM - sees Dr. Laurann Montana - F/u due 2021.  Marland Kitchen Anxiety   . Benign localized prostatic hyperplasia with lower urinary tract symptoms (LUTS)   . Bladder cancer [facility]) urologsit-  dr winter   BCG treatment's   . COPD with emphysema ([place])   . Coronary artery disease primary cardiologist-- dr Arville Lime   a. s/p PTCA to dLAD 2000. b. Normal coronaries in  2003, patent prior PTCA site.  . Dizziness    intermittant  . Dysrhythmia 2019   AF-Eliquis started  . Dysuria   . Feeling light headed    intermittant  . Fibromyalgia   . GERD (gastroesophageal reflux disease)   . History of kidney stones   . History of skin cancer   . Hypothyroidism   . OA (osteoarthritis)    back, shoulders , hips  . PAF (paroxysmal atrial fibrillation) ([place]) 03/05/2018   dx in setting sepsis---  cardiologist-- dr Arville Lime (per dr Denman George note in epic pt was to stop eliquis due to hematuria last dose 09-05-2018)  . PONV (postoperative nausea and vomiting)    NAUSEA ALL DAY AFTER 02-01-17 SURGERY, PT WANTS ANESTHESIA LIKE 06-23-15 SURGERY WITH DR  [name]  . S/P percutaneous transluminal coronary angioplasty    05-18-1999  to dLAD  . Wears glasses    Surgical Hx: The patient  has a past surgical history that includes Colonoscopy with propofol (N/A, 12/13/2013); Coronary angioplasty (05-18-1999  dr Tami Ribas); Cardiac catheterization (06-14-2001  dr Einar Gip); Esophagogastroduodenoscopy (08-24-2006); Transurethral incision of prostate (N/A, 04/01/2014); Cystoscopy with insertion of urolift (N/A, 06/23/2015); Cystoscopy/ureteroscopy/holmium laser/stent placement (Bilateral, 02/01/2017); Cystoscopy with fulgeration (N/A, 08/15/2017); Cardiovascular stress test (06/12/2008); transthoracic echocardiogram (03/06/2018); Cystoscopy with ureteroscopy and stent placement (Left, 04/07/2018); Cystoscopy with stent placement (Left, 04/21/2018); and Cystoscopy with ureteroscopy  and stent placement (Left, 11/28/2018).   Current Medications: No outpatient medications have been marked as taking for the 06/27/19 encounter (Appointment) with Marvin Gill T, PA-C.     Allergies:   Codeine   Social History   Tobacco Use  . Smoking status: Former Smoker    Packs/day: 1.00    Years: 25.00    Pack years: 25.00    Types: Cigarettes    Quit date: 06/01/2003    Years since quitting: 16.0  .  Smokeless tobacco: Never Used  Substance Use Topics  . Alcohol use: No  . Drug use: No     Family Hx: The patient's family history includes Anuerysm in his mother.  ROS:   Please see the history of present illness.    ROS All other systems reviewed and are negative.   EKGs/Labs/Other Test Reviewed:    EKG:  EKG is *** ordered today.  The ekg ordered today demonstrates ***  Recent Labs: 11/28/2018: BUN 17; Creatinine, Ser 0.90; Hemoglobin 16.3; Potassium 3.8; Sodium 141   Recent Lipid Panel Lab Results  Component Value Date/Time   CHOL 178 02/12/2019 08:56 AM   TRIG 126 02/12/2019 08:56 AM   HDL 40 02/12/2019 08:56 AM   CHOLHDL 4.5 02/12/2019 08:56 AM   CHOLHDL 4.5 05/10/2007 03:05 AM   LDLCALC 115 (H) 02/12/2019 08:56 AM    Physical Exam:    VS:  There were no vitals taken for this visit.    Wt Readings from Last 3 Encounters:  02/12/19 225 lb (102.1 kg)  11/28/18 210 lb 4.8 oz (95.4 kg)  06/26/18 219 lb 12.8 oz (99.7 kg)     ***Physical Exam  ASSESSMENT & PLAN:    ***  Dispo:  No follow-ups on file.   Medication Adjustments/Labs and Tests Ordered: Current medicines are reviewed at length with the patient today.  Concerns regarding medicines are outlined above.  Tests Ordered: No orders of the defined types were placed in this encounter.  Medication Changes: No orders of the defined types were placed in this encounter.   Signed, Marvin Dopp, PA-C  06/26/2019 5:23 PM    [facility] [place], [place], [place]  [address] Phone: [phone]; Fax: [phone]

## 2019-06-27 ENCOUNTER — Ambulatory Visit: Payer: Medicare Other | Admitting: Physician Assistant

## 2019-06-28 NOTE — Telephone Encounter (Signed)
FYI Patient canceled appt yesterday. Please contact him to see about rescheduling with one of [place]. Richardson Dopp, PA-C    06/28/2019 7:42 AM

## 2019-06-28 NOTE — Telephone Encounter (Signed)
Message to scheduling to assist with rescheduling.

## 2019-07-03 NOTE — Telephone Encounter (Signed)
Patient has been rescheduled with Richardson Dopp, PA-C on 07/17/19

## 2019-07-16 NOTE — Progress Notes (Deleted)
Cardiology Office Note:    Date:  07/16/2019   ID:  Marvin Gill, DOB 1950/09/28, MRN [id number]  PCP:  Marvin Gill  Cardiologist:  Marvin Gill *** Electrophysiologist:  None   Referring Gill: Marvin Gill   No chief complaint on file. ***  History of Present Illness:    Marvin Gill is a 69 y.o. male with:   Coronary artery disease   S/p POBA to LAD in 2000  Cath in 2003: patent coronary arteries   Abdominal aortic aneurysm   [place] 3/18: 3.1 x 3.1 cm (repeat due in 07/2019)  GERD  Paroxysmal Atrial fibrillation   Occurred in setting of urosepsis 10.2019  CHA2DS2-VASc=2 (age x 1, CAD) >> Apixaban    COPD  Hx of Bladder CA  BPH  Marvin Gill was last seen by Marvin Gill in 01/2019.  He called in recently with recurrent hematuria after a recent bladder procedure.    ***  Prior CV studies:   The following studies were reviewed today:  Cardiac Monitor 01/18/2019 Sinus bradycardia, sinus tachycardia Short burst SVT  Self limited  NO symptoms recorded   Cardiac monitor 09/21/2018 Sinus rhythm with few short bursts of PSVT--longest 11 beats.   Heart rates 47 to 184 bpm   Average HR 68 bpm  Echocardiogram 03/06/2018 Mild LVH, EF 55-60, aortic sclerosis without stenosis, mildly dilated aortic root (37 mm), mild MAC, trivial MR, mild LAE, normal RV SF, mild RAE, PASP 20  Myoview 06/12/2008 Extent, no ischemia; low risk  Cardiac catheterization 06/14/2001 RCA patent Left main patent LCx patent RI patent LAD patent   Past Medical History:  Diagnosis Date  . Aneurysm of abdominal aorta (HCC)    a. ultasound 08-04-16 --- 3.1 CM X 3.1 CM - sees Dr. Laurann Montana - F/u due 2021.  Marland Kitchen Anxiety   . Benign localized prostatic hyperplasia with lower urinary tract symptoms (LUTS)   . Bladder cancer [facility]) urologsit-  dr winter   BCG treatment's   . COPD with emphysema ([place])   . Coronary artery disease primary cardiologist-- dr Arville Lime   a. s/p PTCA to dLAD  2000. b. Normal coronaries in 2003, patent prior PTCA site.  . Dizziness    intermittant  . Dysrhythmia 2019   AF-Eliquis started  . Dysuria   . Feeling light headed    intermittant  . Fibromyalgia   . GERD (gastroesophageal reflux disease)   . History of kidney stones   . History of skin cancer   . Hypothyroidism   . OA (osteoarthritis)    back, shoulders , hips  . PAF (paroxysmal atrial fibrillation) ([place]) 03/05/2018   dx in setting sepsis---  cardiologist-- dr Arville Lime (per dr Denman George note in epic pt was to stop eliquis due to hematuria last dose 09-05-2018)  . PONV (postoperative nausea and vomiting)    NAUSEA ALL DAY AFTER 02-01-17 SURGERY, PT WANTS ANESTHESIA LIKE 06-23-15 SURGERY WITH DR  [name]  . S/P percutaneous transluminal coronary angioplasty    05-18-1999  to dLAD  . Wears glasses    Surgical Hx: The patient  has a past surgical history that includes Colonoscopy with propofol (N/A, 12/13/2013); Coronary angioplasty (05-18-1999  dr Tami Ribas); Cardiac catheterization (06-14-2001  dr Einar Gip); Esophagogastroduodenoscopy (08-24-2006); Transurethral incision of prostate (N/A, 04/01/2014); Cystoscopy with insertion of urolift (N/A, 06/23/2015); Cystoscopy/ureteroscopy/holmium laser/stent placement (Bilateral, 02/01/2017); Cystoscopy with fulgeration (N/A, 08/15/2017); Cardiovascular stress test (06/12/2008); transthoracic echocardiogram (03/06/2018); Cystoscopy with ureteroscopy and stent placement (Left, 04/07/2018); Cystoscopy  with stent placement (Left, 04/21/2018); and Cystoscopy with ureteroscopy and stent placement (Left, 11/28/2018).   Current Medications: No outpatient medications have been marked as taking for the 07/17/19 encounter (Appointment) with Richardson Dopp T, PA-C.     Allergies:   Codeine   Social History   Tobacco Use  . Smoking status: Former Smoker    Packs/day: 1.00    Years: 25.00    Pack years: 25.00    Types: Cigarettes    Quit date: 06/01/2003    Years  since quitting: 16.1  . Smokeless tobacco: Never Used  Substance Use Topics  . Alcohol use: No  . Drug use: No     Family Hx: The patient's family history includes Anuerysm in his mother.  ROS:   Please see the history of present illness.    ROS All other systems reviewed and are negative.   EKGs/Labs/Other Test Reviewed:    EKG:  EKG is *** ordered today.  The ekg ordered today demonstrates ***  Recent Labs: 11/28/2018: BUN 17; Creatinine, Ser 0.90; Hemoglobin 16.3; Potassium 3.8; Sodium 141   Recent Lipid Panel Lab Results  Component Value Date/Time   CHOL 178 02/12/2019 08:56 AM   TRIG 126 02/12/2019 08:56 AM   HDL 40 02/12/2019 08:56 AM   CHOLHDL 4.5 02/12/2019 08:56 AM   CHOLHDL 4.5 05/10/2007 03:05 AM   LDLCALC 115 (H) 02/12/2019 08:56 AM    Physical Exam:    VS:  There were no vitals taken for this visit.    Wt Readings from Last 3 Encounters:  02/12/19 225 lb (102.1 kg)  11/28/18 210 lb 4.8 oz (95.4 kg)  06/26/18 219 lb 12.8 oz (99.7 kg)     ***Physical Exam  ASSESSMENT & PLAN:    ***  Dispo:  No follow-ups on file.   Medication Adjustments/Labs and Tests Ordered: Current medicines are reviewed at length with the patient today.  Concerns regarding medicines are outlined above.  Tests Ordered: No orders of the defined types were placed in this encounter.  Medication Changes: No orders of the defined types were placed in this encounter.   Signed, Richardson Dopp, PA-C  07/16/2019 10:45 PM    [facility] [place], [place], [place]  [address] Phone: [phone]; Fax: [phone]

## 2019-07-17 ENCOUNTER — Ambulatory Visit: Payer: Medicare Other | Admitting: Family Medicine

## 2019-07-17 ENCOUNTER — Other Ambulatory Visit: Payer: Self-pay

## 2019-07-17 ENCOUNTER — Encounter: Payer: Self-pay | Admitting: Family Medicine

## 2019-07-17 VITALS — BP 114/80 | HR 84 | Ht 78.0 in | Wt 212.0 lb

## 2019-07-17 DIAGNOSIS — I2510 Atherosclerotic heart disease of native coronary artery without angina pectoris: Secondary | ICD-10-CM | POA: Diagnosis not present

## 2019-07-17 DIAGNOSIS — I480 Paroxysmal atrial fibrillation: Secondary | ICD-10-CM | POA: Diagnosis not present

## 2019-07-17 DIAGNOSIS — C669 Malignant neoplasm of unspecified ureter: Secondary | ICD-10-CM

## 2019-07-17 DIAGNOSIS — I714 Abdominal aortic aneurysm, without rupture, unspecified: Secondary | ICD-10-CM

## 2019-07-17 MED ORDER — APIXABAN 5 MG PO TABS: 5.0000 mg | ORAL_TABLET | Freq: Two times a day (BID) | ORAL | 5 refills | Status: DC

## 2019-07-17 NOTE — Patient Instructions (Signed)
Medication Instructions:   Your physician has recommended you make the following change in your medication:   1) Stop Aspirin 81 mg 2) Start Eliquis 5 mg, 1 tablet by mouth twice a day  *If you need a refill on your cardiac medications before your next appointment, please call your pharmacy*  Lab Work:  None ordered today  If you have labs (blood work) drawn today and your tests are completely normal, you will receive your results only by: Marland Kitchen MyChart Message (if you have MyChart) OR . A paper copy in the mail If you have any lab test that is abnormal or we need to change your treatment, we will call you to review the results.  Testing/Procedures:  Your physician has requested that you have an abdominal aorta duplex. During this test, an ultrasound is used to evaluate the aorta. Allow 30 minutes for this exam. Do not eat after midnight the day before and avoid carbonated beverages  Follow-Up: At [facility], you and your health needs are our priority.  As part of our continuing mission to provide you with exceptional heart care, we have created designated Provider Care Teams.  These Care Teams include your primary Cardiologist (physician) and Advanced Practice Providers (APPs -  Physician Assistants and Nurse Practitioners) who all work together to provide you with the care you need, when you need it.  Your next appointment:   3 month(s)  The format for your next appointment:   In Person  Provider:   Dorris Carnes, MD

## 2019-07-17 NOTE — Progress Notes (Addendum)
Cardiology Office Note  Date: 07/17/2019   ID: Marvin Gill, Marvin Gill 1951-02-23, MRN [id number]  PCP:  Lavone Orn, MD  Cardiologist:  Dorris Carnes, MD Electrophysiologist:  None   Chief Complaint  Patient presents with  . Follow-up    PAF, CAD, AAA, Hematuria    History of Present Illness: Marvin Gill is a 69 y.o. male with a history of CAD status post PTCA of LAD in 2000.  Last catheterization 2003 normal coronaries.  AAA, GERD, A. fib, echo with left ventricular ejection fraction 55 to 60%.  Recent hematuria with bladder cancer. Hx of multiple cystoscopies.  Patient had complained of recent hematuria after a recent urinary procedure and stopped his eliquis. He started back 2 weeks later and the bleeding returned so he stopped it again. He also c/o of dizziness when standing quickly.  He also c/o burning during during urination. He was pending an [place] per phone note at that time. He was advised to f/ with urologist during this phone call secondary to hematuria.   Patient states during the interim since last visit he had a cystoscopy and was told there were no new issues with his urinary tract.  States he also had a ultrasound which showed no abnormalities in addition.  States he has had no more bleeding since stopping the Eliquis.  States he was recently prescribed some antibiotics for possible urinary tract infection.  This may have accounted for the bleeding.  Otherwise patient denies any recent progressive anginal or exertional symptoms, palpitations or arrhythmias, orthostatic symptoms, stroke or TIA-like symptoms, blood in stool or urine, syncopal or near syncopal episode, claudication-like symptoms, DVT or PE-like symptoms, or lower extremity edema.  Patient states he started taking aspirin 81 mg after Eliquis was stopped.  Past Medical History:  Diagnosis Date  . Aneurysm of abdominal aorta (HCC)    a. ultasound 08-04-16 --- 3.1 CM X 3.1 CM - sees Dr. Laurann Montana - F/u due 2021.    Marland Kitchen Anxiety   . Benign localized prostatic hyperplasia with lower urinary tract symptoms (LUTS)   . Bladder cancer [facility]) urologsit-  dr winter   BCG treatment's   . COPD with emphysema ([place])   . Coronary artery disease primary cardiologist-- dr Arville Lime   a. s/p PTCA to dLAD 2000. b. Normal coronaries in 2003, patent prior PTCA site.  . Dizziness    intermittant  . Dysrhythmia 2019   AF-Eliquis started  . Dysuria   . Feeling light headed    intermittant  . Fibromyalgia   . GERD (gastroesophageal reflux disease)   . History of kidney stones   . History of skin cancer   . Hypothyroidism   . OA (osteoarthritis)    back, shoulders , hips  . PAF (paroxysmal atrial fibrillation) ([place]) 03/05/2018   dx in setting sepsis---  cardiologist-- dr Arville Lime (per dr Denman George note in epic pt was to stop eliquis due to hematuria last dose 09-05-2018)  . PONV (postoperative nausea and vomiting)    NAUSEA ALL DAY AFTER 02-01-17 SURGERY, PT WANTS ANESTHESIA LIKE 06-23-15 SURGERY WITH DR  [name]  . S/P percutaneous transluminal coronary angioplasty    05-18-1999  to dLAD  . Wears glasses     Past Surgical History:  Procedure Laterality Date  . CARDIAC CATHETERIZATION  06-14-2001  dr Einar Gip   normal coronary arteries and patent previous dLAD   . CARDIOVASCULAR STRESS TEST  06/12/2008   Low risk nuclear study w/ no evidence  ischemia/  normal LV funciton and wall motion , ef 60%  . COLONOSCOPY WITH PROPOFOL N/A 12/13/2013   Procedure: COLONOSCOPY WITH PROPOFOL;  Surgeon: Garlan Fair, MD;  Location: [facility];  Service: Endoscopy;  Laterality: N/A;  . CORONARY ANGIOPLASTY  05-18-1999  dr Tami Ribas   PTCA balloon to dLAD 80% (single vessel disease),  ef 60%  . CYSTOSCOPY WITH FULGERATION N/A 08/15/2017   Procedure: CYSTOSCOP/ BLADDER BIOPSY/TURBT;  Surgeon: Nickie Retort, MD;  Location: [facility];  Service: Urology;  Laterality: N/A;  . CYSTOSCOPY WITH INSERTION OF  UROLIFT N/A 06/23/2015   Procedure: CYSTOSCOPY WITH INSERTION OF UROLIFT;  Surgeon: Carolan Clines, MD;  [facility];  Service: Urology;  Laterality: N/A;  . CYSTOSCOPY WITH STENT PLACEMENT Left 04/21/2018   Procedure: CYSTOSCOPY WITH STENT PLACEMENT;  Surgeon: Bjorn Loser, MD;  Location: [facility];  Service: Urology;  Laterality: Left;  Emergent case  . CYSTOSCOPY WITH URETEROSCOPY AND STENT PLACEMENT Left 04/07/2018   Procedure: CYSTOSCOPY WITH URETEROSCOPY AND STENT EXCHANGE/ BLADDER BIOPSY/LASER ABLATION OF URETERAL TUMOR;  Surgeon: Ceasar Mons, MD;  Location: [facility];  Service: Urology;  Laterality: Left;  ONLY NEEDS 60 MIN  . CYSTOSCOPY WITH URETEROSCOPY AND STENT PLACEMENT Left 11/28/2018   Procedure: CYSTOSCOPY WITH URETEROSCOPY , LEFT URETERAL AND BLADDER BIOPSY, LEFT STENT PLACEMENT;  Surgeon: Ceasar Mons, MD;  Location[facility];  Service: Urology;  Laterality: Left;  . CYSTOSCOPY/URETEROSCOPY/HOLMIUM LASER/STENT PLACEMENT Bilateral 02/01/2017   Procedure: CYSTOSCOPY/URETEROSCOPY/HOLMIUM LASER/STENT PLACEMENT,STONE BASKETRY, BLADDER BIOPSY;  Surgeon: Nickie Retort, MD;  Location: [facility];  Service: Urology;  Laterality: Bilateral;  . ESOPHAGOGASTRODUODENOSCOPY  08-24-2006  . TRANSTHORACIC ECHOCARDIOGRAM  03/06/2018   mild LVH,  ef 55-60%,  pt in atrial fib. LV diastolic not evaluated/  mild LAE and RAE/  moderate AV calcification with sclerosis , no stenosis or regurg./  mild dilated aortic root, 93mm/  trival MR and TR  . TRANSURETHRAL INCISION OF PROSTATE N/A 04/01/2014   Procedure: TRANSURETHRAL INCISION OF THE PROSTATE (TUIP);  Surgeon: Ailene Rud, MD;  Location: [facility];  Service: Urology;  Laterality: N/A;    Current Outpatient Medications  Medication Sig Dispense Refill  . ALPRAZolam (XANAX) 1 MG tablet Take 1 mg by mouth 2 (two) times daily.     Marland Kitchen  esomeprazole (NEXIUM) 40 MG capsule Take 40 mg by mouth daily.     . fluconazole (DIFLUCAN) 150 MG tablet Take 2 tablets (300 mg total) by mouth once a week. 24 tablet 0  . gabapentin (NEURONTIN) 800 MG tablet Take 800 mg by mouth 3 (three) times daily.   3  . HYDROcodone-acetaminophen (NORCO) 5-325 MG tablet Take 1 tablet by mouth every 4 (four) hours as needed for moderate pain. 20 tablet 0  . levothyroxine (SYNTHROID, LEVOTHROID) 50 MCG tablet Take 1 tablet (50 mcg total) by mouth daily before breakfast. 30 tablet 0  . oxybutynin (DITROPAN) 5 MG tablet Take 1 tablet (5 mg total) by mouth every 8 (eight) hours as needed for bladder spasms. 30 tablet 1  . rosuvastatin (CRESTOR) 10 MG tablet Take 1 tablet (10 mg total) by mouth daily. 90 tablet 3  . tamsulosin (FLOMAX) 0.4 MG CAPS capsule Take 0.4 mg by mouth every evening.   9  . valACYclovir (VALTREX) 1000 MG tablet Take 1,000 mg by mouth 2 (two) times daily as needed. X 7 DAYS, THEN 500 MG BID X 6 MONTHS    .  vitamin B-12 (CYANOCOBALAMIN) 1000 MCG tablet Take 1,000 mcg by mouth daily.     No current facility-administered medications for this visit.   Allergies:  Codeine   Social History: The patient  reports that he quit smoking about 16 years ago. His smoking use included cigarettes. He has a 25.00 pack-year smoking history. He has never used smokeless tobacco. He reports that he does not drink alcohol or use drugs.   Family History: The patient's family history includes Anuerysm in his mother.   ROS:  Please see the history of present illness. Otherwise, complete review of systems is positive for none.  All other systems are reviewed and negative.   Physical Exam: VS:  BP 140/82   Pulse 84   Ht 6\' 6"  (1.981 m)   Wt 212 lb (96.2 kg)   SpO2 95%   BMI 24.50 kg/m , BMI Body mass index is 24.5 kg/m.  Wt Readings from Last 3 Encounters:  07/17/19 212 lb (96.2 kg)  02/12/19 225 lb (102.1 kg)  11/28/18 210 lb 4.8 oz (95.4 kg)     General: Patient appears comfortable at rest. Neck: Supple, no elevated JVP or carotid bruits, no thyromegaly. Lungs: Clear to auscultation, nonlabored breathing at rest. Cardiac: Regular rate and rhythm, no S3 or significant systolic murmur, no pericardial rub. Extremities: No pitting edema, distal pulses 2+. Skin: Warm and dry. Musculoskeletal: No kyphosis. Neuropsychiatric: Alert and oriented x3, affect grossly appropriate.  ECG:  An ECG dated July 17, 2019 was personally reviewed today and demonstrated:  Normal sinus rhythm rate of 84 with no obvious acute ST or T wave changes  Recent Labwork: 11/28/2018: BUN 17; Creatinine, Ser 0.90; Hemoglobin 16.3; Potassium 3.8; Sodium 141     Component Value Date/Time   CHOL 178 02/12/2019 0856   TRIG 126 02/12/2019 0856   HDL 40 02/12/2019 0856   CHOLHDL 4.5 02/12/2019 0856   CHOLHDL 4.5 05/10/2007 0305   VLDL 17 05/10/2007 0305   LDLCALC 115 (H) 02/12/2019 0856    Other Studies Reviewed Today:   Long Term event Monitor Results 01/21/2019 Patient had a min HR of 39 bpm, max HR of 210 bpm, and avg HR of 60 bpm. Predominant underlying rhythm was Sinus Rhythm. 1 run of Ventricular Tachycardia occurred lasting 5 beats with a max rate of 197 bpm (avg 162 bpm). 36 Supraventricular Tachycardia runs occurred, the run with the fastest interval lasting 10 beats with a max rate of 210 bpm, the longest lasting 13.7 secs with an avg rate of 100 bpm. Isolated SVEs were rare (<1.0%), SVE Couplets were rare (<1.0%), and SVE Triplets were rare (<1.0%). Isolated VEs were rare (<1.0%, 322), VE Triplets were rare (<1.0%, 1), and no VE Couplets were present.  Echocardiogram 02/2018 Study Conclusions   - Left ventricle: The cavity size was normal. Wall thickness was  increased in a pattern of mild LVH. Systolic function was normal.  The estimated ejection fraction was in the range of 55% to 60%.  Although no diagnostic regional wall motion  abnormality was  identified, this possibility cannot be completely excluded on the  basis of this study. The study was not technically sufficient to  allow evaluation of LV diastolic dysfunction due to atrial  fibrillation.  - Aortic valve: Trileaflet; moderately calcified leaflets.  Sclerosis without stenosis.  - Aorta: Mildly dilated aortic root. Aortic root dimension: 37 mm  (ED).  - Mitral valve: Mildly calcified annulus. There was trivial  regurgitation.  - Left atrium: The  atrium was mildly dilated.  - Right ventricle: The cavity size was normal. Systolic function  was normal.  - Right atrium: The atrium was mildly dilated.  - Tricuspid valve: Peak RV-RA gradient (S): 17 mm Hg.  - Pulmonary arteries: PA peak pressure: 20 mm Hg (S).  - Inferior vena cava: The vessel was normal in size. The  respirophasic diameter changes were in the normal range (>= 50%),  consistent with normal central venous pressure.   Impressions:  - The patient was in atrial fibrillation. Normal LV size with mild  LV hypertrophy. EF 55-60%. Normal RV size and systolic function.  No significant valvular abnormalities.   CT Abdomen / Pelvis 05/28/2019     CT abdomen pelvis for microhematuria 05/28/2019  IMPRESSION: 1. No evidence of aortic dissection, aneurysm or large vessel occlusion. 2. Persistent asymmetric left ureteral dilatation and wall thickening following ureteral stent removal. No obstructing calculus identified. This protocol does not include delayed imaging through the ureters, but no high-grade ureteral obstruction or focal urothelial lesion identified. Urology follow-up recommended. 3. Mild Aortic Atherosclerosis (ICD10-I70.0). Calcifications of the aortic valve. 4.  Emphysema (ICD10-J43.9). 5. Scattered small pulmonary nodules, nonspecific. Those at the lung bases appear chronic and unchanged. No routine follow-up is necessary in a low risk patient per  consensus guidelines.     Assessment and Plan:  1. Paroxysmal atrial fibrillation (HCC)   2. Coronary artery disease involving native coronary artery of native heart without angina pectoris   3. Carcinoma of ureter, unspecified laterality ([place])   4. AAA (abdominal aortic aneurysm) without rupture ([place])     1. Paroxysmal atrial fibrillation (HCC)  Recent hematuria and eliquis was stopped. Patient states he had a subsequent cystoscopy which was  normal since stopping eliquis. No bleeding was noted  Restart Eliquis at 5 mg p.o. twice daily.  Call if bleeding recurs.  Stop aspirin.  May need an monitor in the future to see if the patient is having episodes of atrial fibrillation or remains in normal sinus rhythm.  Patient states he has had one episode recently when doing some heavy lifting and noticed some palpitations, but no recurrences since that time.  He admits occasional transient sense of dizziness but no syncopal or near syncopal episodes.  EKG today showed normal sinus rhythm with a heart rate of 84.  2. Coronary artery disease involving native coronary artery of native heart without angina pectoris History of coronary artery disease with previous stent placement to distal LAD.  Cardiac catheterization January 2003 showed patent distal LAD.Normal left ventricular systolic function, normal coronary arteries with no evidence of any plaque noted in the distal left anterior descending artery percutaneous transluminal coronary angioplasty site.  Continue rosuvastatin 10 mg daily.  3. Carcinoma of ureter, unspecified laterality ([place]) With recent microscopic hematuria.  Patient states he had a recent ultrasound and cystoscopy which were benign and showed no significant issues.  He was treated recently was treated for urinary tract infection after complaints of burning during urination. He has had no more bleeding since stopping the Eliquis. Continue to follow up with urology.  4. AAA (abdominal  aortic aneurysm) without rupture (HCC) AAA via [place] 2018 (3.1 cm x 3.1 cm)    Get abdominal aortic ultrasound to assess current status of aneurysm   Medication Adjustments/Labs and Tests Ordered: Current medicines are reviewed at length with the patient today.  Concerns regarding medicines are outlined above.     Signed, Levell July, NP 07/17/2019 9:06 AM  [place] at [facility]

## 2019-07-30 ENCOUNTER — Ambulatory Visit (HOSPITAL_COMMUNITY)
Admission: RE | Admit: 2019-07-30 | Payer: Medicare Other | Source: Ambulatory Visit | Attending: Family Medicine | Admitting: Family Medicine

## 2019-08-07 ENCOUNTER — Encounter (HOSPITAL_COMMUNITY): Payer: Self-pay

## 2019-08-20 ENCOUNTER — Ambulatory Visit (HOSPITAL_COMMUNITY)
Admission: RE | Admit: 2019-08-20 | Discharge: 2019-08-20 | Disposition: A | Discharge status: Home / Self Care | Payer: Medicare Other | Source: Ambulatory Visit | Attending: Cardiology | Admitting: Cardiology

## 2019-08-20 ENCOUNTER — Other Ambulatory Visit: Payer: Self-pay

## 2019-08-20 DIAGNOSIS — I714 Abdominal aortic aneurysm, without rupture, unspecified: Secondary | ICD-10-CM

## 2019-08-21 ENCOUNTER — Telehealth: Payer: Self-pay | Admitting: *Deleted

## 2019-08-21 NOTE — Telephone Encounter (Signed)
-----   Message from Verta Ellen., NP sent at 08/20/2019  9:38 AM EDT ----- Please call patient and let him know there was no evidence of any type of aortic aneurysm.  Everything looked normal.

## 2019-08-21 NOTE — Telephone Encounter (Signed)
Patient informed. Copy sent to PCP °

## 2019-09-20 ENCOUNTER — Other Ambulatory Visit: Payer: Self-pay | Admitting: Internal Medicine

## 2019-09-20 MED ORDER — APIXABAN 5 MG PO TABS: 5.0000 mg | ORAL_TABLET | Freq: Two times a day (BID) | ORAL | 1 refills | Status: DC

## 2019-09-20 NOTE — Telephone Encounter (Signed)
*  STAT* If patient is at the pharmacy, call can be transferred to refill team.   1. Which medications need to be refilled? (please list name of each medication and dose if known) apixaban (ELIQUIS) 5 MG TABS tablet  2. Which pharmacy/location (including street and city if local pharmacy) is medication to be sent to? [id number] - [place], [place] - [address] [place] [address] [place] [address]  3. Do they need a 30 day or 90 day supply? [place]

## 2019-09-20 NOTE — Telephone Encounter (Signed)
Last OV 07/17/19 69 years old Scr 0.90 96.2kg Eliquis 5mg  BID sent to pharmacy

## 2019-10-07 NOTE — Progress Notes (Deleted)
Cardiology Office Note   Date:  10/07/2019   ID:  Marvin, Gill [birth year], MRN [id number]  PCP:  Lavone Orn, MD  Cardiologist:   Dorris Carnes, MD   F/U of CAD    History of Present Illness: Marvin Gill is a 69 y.o. male with a history of CAD (s/p PTCA of LAD in 2000; last cath in 2003 normal coronary arteries , patent PTCA site), AAA, GERD, atrial fibrillation (in setting of sepsis), urosepsis.  Echo LVEF 55 to 60%^    Converted to SR   I saw the pt in Sept 2020   He was seen by A QUinn in Feb 2021  Eliquis restarted  No outpatient medications have been marked as taking for the 10/08/19 encounter (Appointment) with Fay Records, MD.     Allergies:   Codeine   Past Medical History:  Diagnosis Date  . Aneurysm of abdominal aorta (HCC)    a. ultasound 08-04-16 --- 3.1 CM X 3.1 CM - sees Dr. Laurann Montana - F/u due 2021.  Marland Kitchen Anxiety   . Benign localized prostatic hyperplasia with lower urinary tract symptoms (LUTS)   . Bladder cancer [facility]) urologsit-  dr winter   BCG treatment's   . COPD with emphysema ([place])   . Coronary artery disease primary cardiologist-- dr Arville Lime   a. s/p PTCA to dLAD 2000. b. Normal coronaries in 2003, patent prior PTCA site.  . Dizziness    intermittant  . Dysrhythmia 2019   AF-Eliquis started  . Dysuria   . Feeling light headed    intermittant  . Fibromyalgia   . GERD (gastroesophageal reflux disease)   . History of kidney stones   . History of skin cancer   . Hypothyroidism   . OA (osteoarthritis)    back, shoulders , hips  . PAF (paroxysmal atrial fibrillation) ([place]) 03/05/2018   dx in setting sepsis---  cardiologist-- dr Arville Lime (per dr Denman George note in epic pt was to stop eliquis due to hematuria last dose 09-05-2018)  . PONV (postoperative nausea and vomiting)    NAUSEA ALL DAY AFTER 02-01-17 SURGERY, PT WANTS ANESTHESIA LIKE 06-23-15 SURGERY WITH DR  [name]  . S/P percutaneous transluminal coronary angioplasty    05-18-1999   to dLAD  . Wears glasses     Past Surgical History:  Procedure Laterality Date  . CARDIAC CATHETERIZATION  06-14-2001  dr Einar Gip   normal coronary arteries and patent previous dLAD   . CARDIOVASCULAR STRESS TEST  06/12/2008   Low risk nuclear study w/ no evidence ischemia/  normal LV funciton and wall motion , ef 60%  . COLONOSCOPY WITH PROPOFOL N/A 12/13/2013   Procedure: COLONOSCOPY WITH PROPOFOL;  Surgeon: Garlan Fair, MD;  [facility];  Service: Endoscopy;  Laterality: N/A;  . CORONARY ANGIOPLASTY  05-18-1999  dr Tami Ribas   PTCA balloon to dLAD 80% (single vessel disease),  ef 60%  . CYSTOSCOPY WITH FULGERATION N/A 08/15/2017   Procedure: CYSTOSCOP/ BLADDER BIOPSY/TURBT;  Surgeon: Nickie Retort, MD;  Location: [facility];  Service: Urology;  Laterality: N/A;  . CYSTOSCOPY WITH INSERTION OF UROLIFT N/A 06/23/2015   Procedure: CYSTOSCOPY WITH INSERTION OF UROLIFT;  Surgeon: Carolan Clines, MD;  [facility];  Service: Urology;  Laterality: N/A;  . CYSTOSCOPY WITH STENT PLACEMENT Left 04/21/2018   Procedure: CYSTOSCOPY WITH STENT PLACEMENT;  Surgeon: Bjorn Loser, MD;  Location: [place];  Service: Urology;  Laterality: Left;  Emergent case  .  CYSTOSCOPY WITH URETEROSCOPY AND STENT PLACEMENT Left 04/07/2018   Procedure: CYSTOSCOPY WITH URETEROSCOPY AND STENT EXCHANGE/ BLADDER BIOPSY/LASER ABLATION OF URETERAL TUMOR;  Surgeon: Ceasar Mons, MD;  Location: [facility];  Service: Urology;  Laterality: Left;  ONLY NEEDS 60 MIN  . CYSTOSCOPY WITH URETEROSCOPY AND STENT PLACEMENT Left 11/28/2018   Procedure: CYSTOSCOPY WITH URETEROSCOPY , LEFT URETERAL AND BLADDER BIOPSY, LEFT STENT PLACEMENT;  Surgeon: Ceasar Mons, MD;  Location: [facility];  Service: Urology;  Laterality: Left;  . CYSTOSCOPY/URETEROSCOPY/HOLMIUM LASER/STENT PLACEMENT Bilateral 02/01/2017   Procedure: CYSTOSCOPY/URETEROSCOPY/HOLMIUM  LASER/STENT PLACEMENT,STONE BASKETRY, BLADDER BIOPSY;  Surgeon: Nickie Retort, MD;  Location: [facility];  Service: Urology;  Laterality: Bilateral;  . ESOPHAGOGASTRODUODENOSCOPY  08-24-2006  . TRANSTHORACIC ECHOCARDIOGRAM  03/06/2018   mild LVH,  ef 55-60%,  pt in atrial fib. LV diastolic not evaluated/  mild LAE and RAE/  moderate AV calcification with sclerosis , no stenosis or regurg./  mild dilated aortic root, 15mm/  trival MR and TR  . TRANSURETHRAL INCISION OF PROSTATE N/A 04/01/2014   Procedure: TRANSURETHRAL INCISION OF THE PROSTATE (TUIP);  Surgeon: Ailene Rud, MD;  Location: [facility];  Service: Urology;  Laterality: N/A;     Social History:  The patient  reports that he quit smoking about 16 years ago. His smoking use included cigarettes. He has a 25.00 pack-year smoking history. He has never used smokeless tobacco. He reports that he does not drink alcohol or use drugs.   Family History:  The patient's family history includes Anuerysm in his mother.    ROS:  Please see the history of present illness. All other systems are reviewed and  Negative to the above problem except as noted.    PHYSICAL EXAM: VS:  There were no vitals taken for this visit.  GEN: Well nourished, well developed, in no acute distress  HEENT: normal  Neck: JVP is normal    NO carotid bruits, or masses Cardiac: RRR; no murmurs, rubs, or gallops,no edema  Respiratory:  clear to auscultation bilaterally, normal work of breathing GI: soft, nontender, nondistended, + BS  No hepatomegaly  MS: no deformity Moving all extremities   Skin: warm and dry, no rash Neuro:  Strength and sensation are intact Psych: euthymic mood, full affect   EKG:  EKG is not ordered today.   Lipid Panel    Component Value Date/Time   CHOL 178 02/12/2019 0856   TRIG 126 02/12/2019 0856   HDL 40 02/12/2019 0856   CHOLHDL 4.5 02/12/2019 0856   CHOLHDL 4.5 05/10/2007 0305   VLDL  17 05/10/2007 0305   LDLCALC 115 (H) 02/12/2019 0856      Wt Readings from Last 3 Encounters:  07/17/19 212 lb (96.2 kg)  02/12/19 225 lb (102.1 kg)  11/28/18 210 lb 4.8 oz (95.4 kg)      ASSESSMENT AND PLAN:   CAD  Remains symptom free   Will continue meds     2  PAF  Clinically in sinus rhythm   Review of records for afib  IF occurred only with sepsis need to eval use of anticoagulation  3  HL  COntniue statin  Check lipids today    4  Urology   Follow with urol for bladder Ca     5  AAA   USN:  F/U clnic     Current medicines are reviewed at length with the patient today.  The patient does not  have concerns regarding medicines.  Signed, Dorris Carnes, MD  10/07/2019 8:48 PM    [facility] [place], [place], [place]  [address] Phone: [phone]; Fax: [phone]

## 2019-10-08 ENCOUNTER — Ambulatory Visit: Payer: Medicare Other | Admitting: Internal Medicine

## 2019-12-18 NOTE — Progress Notes (Deleted)
Cardiology Office Note:    Date:  12/18/2019   ID:  Marvin Gill, DOB 24-Jan-1951, MRN [id number]  PCP:  Lavone Orn, MD  Cardiologist:  Dorris Carnes, MD *** Electrophysiologist:  None   Referring MD: Lavone Orn, MD   Chief Complaint:  No chief complaint on file.    Patient Profile:    Marvin Gill is a 69 y.o. male with:   Coronary artery disease   S/p POBA to dLAD in 05/1999  Cath in 1/03: no sig CAD, dLAD site patent  Myoview 05/2008: low risk  Paroxysmal atrial fibrillation  CHA2DS2-VASc=2 (CAD, age x 1) >> Apixaban    GERD  Bladder CA  Hyperlipidemia   Prior CV studies: AAA [place] 08/20/19 No abdominal aortic aneurysm   Event monitor 12/2018 Sinus rhythm, short burst of SVT - self limited (no symptoms)  Event monitor 08/2018 Normal sinus rhythm, few short bursts of PSVT (longest 11 beats), avg HR 68  Echocardiogram 03/06/18 Mild LVH, EF 55-60, AoV sclerosis (no AS), trivial MR, mild BAE, normal RVSF, PASP 20  Myoview 06/12/08 diaph atten, no ischemia or infarct, EF 60; low risk  Cardiac catheterization 06/14/01 Normal coronary arteries dLAD PTCA site patent  Normal EF   Cardiac catheterization 05/20/1999 No sig CAD  History of Present Illness:    Marvin Gill was last seen in clinic by Katina Dung, NP.  The patient had been off of Apixaban due to hematuria.  This was resolved and the Apixaban was resumed.    The DICTATELATER SmartLink is not supported in this context. ***   Past Medical History:  Diagnosis Date   Aneurysm of abdominal aorta (HCC)    a. ultasound 08-04-16 --- 3.1 CM X 3.1 CM - sees Dr. Laurann Montana - F/u due 2021.   Anxiety    Benign localized prostatic hyperplasia with lower urinary tract symptoms (LUTS)    Bladder cancer [facility]) urologsit-  dr winter   BCG treatment's    COPD with emphysema [facility])    Coronary artery disease primary cardiologist-- dr Arville Lime   a. s/p PTCA to dLAD 2000. b. Normal coronaries in 2003, patent  prior PTCA site.   Dizziness    intermittant   Dysrhythmia 2019   AF-Eliquis started   Dysuria    Feeling light headed    intermittant   Fibromyalgia    GERD (gastroesophageal reflux disease)    History of kidney stones    History of skin cancer    Hypothyroidism    OA (osteoarthritis)    back, shoulders , hips   PAF (paroxysmal atrial fibrillation) ([place]) 03/05/2018   dx in setting sepsis---  cardiologist-- dr Arville Lime (per dr Denman George note in epic pt was to stop eliquis due to hematuria last dose 09-05-2018)   PONV (postoperative nausea and vomiting)    NAUSEA ALL DAY AFTER 02-01-17 SURGERY, PT WANTS ANESTHESIA LIKE 06-23-15 SURGERY WITH DR  [name]   S/P percutaneous transluminal coronary angioplasty    05-18-1999  to dLAD   Wears glasses     Current Medications: No outpatient medications have been marked as taking for the 12/19/19 encounter (Appointment) with Richardson Dopp T, PA-C.     Allergies:   Codeine   Social History   Tobacco Use   Smoking status: Former Smoker    Packs/day: 1.00    Years: 25.00    Pack years: 25.00    Types: Cigarettes    Quit date: 06/01/2003    Years since quitting: 58.5  Smokeless tobacco: Never Used  Vaping Use   Vaping Use: Never used  Substance Use Topics   Alcohol use: No   Drug use: No     Family Hx: The patient's family history includes Anuerysm in his mother.  ROS   EKGs/Labs/Other Test Reviewed:    EKG:  EKG is *** ordered today.  The ekg ordered today demonstrates ***  Recent Labs: No results found for requested labs within last 8760 hours.   Recent Lipid Panel Lab Results  Component Value Date/Time   CHOL 178 02/12/2019 08:56 AM   TRIG 126 02/12/2019 08:56 AM   HDL 40 02/12/2019 08:56 AM   CHOLHDL 4.5 02/12/2019 08:56 AM   CHOLHDL 4.5 05/10/2007 03:05 AM   LDLCALC 115 (H) 02/12/2019 08:56 AM    Physical Exam:    VS:  There were no vitals taken for this visit.    Wt Readings from Last 3  Encounters:  07/17/19 212 lb (96.2 kg)  02/12/19 225 lb (102.1 kg)  11/28/18 210 lb 4.8 oz (95.4 kg)     Physical Exam ***  ASSESSMENT & PLAN:    ***  Dispo:  No follow-ups on file.   Medication Adjustments/Labs and Tests Ordered: Current medicines are reviewed at length with the patient today.  Concerns regarding medicines are outlined above.  Tests Ordered: No orders of the defined types were placed in this encounter.  Medication Changes: No orders of the defined types were placed in this encounter.   Signed, Richardson Dopp, PA-C  12/18/2019 7:15 PM    [facility] [place], [place], [place]  [address] Phone: [phone]; Fax: [phone]

## 2019-12-19 ENCOUNTER — Ambulatory Visit: Payer: Medicare Other | Admitting: Physician Assistant

## 2020-01-01 NOTE — Progress Notes (Signed)
Cardiology Office Note    Date:  01/02/2020   ID:  Olan, Kurek 1950/11/28, MRN [id number]  PCP:  Lavone Orn, MD  Cardiologist: Dorris Carnes, MD EPS: None  Chief Complaint  Patient presents with  . Follow-up    History of Present Illness:  Marvin Gill is a 69 y.o. male with a history of CAD status post PTCA of LAD in 2000.  Last catheterization 2003 normal coronaries.  AAA, GERD, A. fib, echo with left ventricular ejection fraction 55 to 60%.  Recent hematuria with bladder cancer.   Patient had complained of recent hematuria after a recent urinary procedure and stopped his eliquis. He started back 2 weeks later and the bleeding returned so he stopped it again and started ASA. Last saw Levell July, NP who restarted Eliquis and stopped ASA. [place] 08/20/19 no evidence of AAA.  Patient comes in for f/u. Occasionally has sharp left chest pain about once a week lasting 10-15 seconds that comes and goes that feels like muscle spasms. 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 Denies chest tightness or pressure but occasionally gets shortness of breath with heavy exertion. Thinks it's age related. Walks 2 miles daily without trouble.  No further bleeding on eliquis. No palpitations or recurrent Afib.Chronic back pain-getting 4 shots next week.  Past Medical History:  Diagnosis Date  . Aneurysm of abdominal aorta (HCC)    a. ultasound 08-04-16 --- 3.1 CM X 3.1 CM - sees Dr. Laurann Montana - F/u due 2021.  Marland Kitchen Anxiety   . Benign localized prostatic hyperplasia with lower urinary tract symptoms (LUTS)   . Bladder cancer [facility]) urologsit-  dr winter   BCG treatment's   . COPD with emphysema ([place])   . Coronary artery disease primary cardiologist-- dr Arville Lime   a. s/p PTCA to dLAD 2000. b. Normal coronaries in 2003, patent prior PTCA site.  . Dizziness    intermittant  . Dysrhythmia 2019   AF-Eliquis started  . Dysuria   . Feeling light headed    intermittant  .  Fibromyalgia   . GERD (gastroesophageal reflux disease)   . History of kidney stones   . History of skin cancer   . Hypothyroidism   . OA (osteoarthritis)    back, shoulders , hips  . PAF (paroxysmal atrial fibrillation) ([place]) 03/05/2018   dx in setting sepsis---  cardiologist-- dr Arville Lime (per dr Denman George note in epic pt was to stop eliquis due to hematuria last dose 09-05-2018)  . PONV (postoperative nausea and vomiting)    NAUSEA ALL DAY AFTER 02-01-17 SURGERY, PT WANTS ANESTHESIA LIKE 06-23-15 SURGERY WITH DR  [name]  . S/P percutaneous transluminal coronary angioplasty    05-18-1999  to dLAD  . Wears glasses     Past Surgical History:  Procedure Laterality Date  . CARDIAC CATHETERIZATION  06-14-2001  dr Einar Gip   normal coronary arteries and patent previous dLAD   . CARDIOVASCULAR STRESS TEST  06/12/2008   Low risk nuclear study w/ no evidence ischemia/  normal LV funciton and wall motion , ef 60%  . COLONOSCOPY WITH PROPOFOL N/A 12/13/2013   Procedure: COLONOSCOPY WITH PROPOFOL;  Surgeon: Garlan Fair, MD;  Location: [facility];  Service: Endoscopy;  Laterality: N/A;  . CORONARY ANGIOPLASTY  05-18-1999  dr Tami Ribas   PTCA balloon to dLAD 80% (single vessel disease),  ef 60%  . CYSTOSCOPY WITH FULGERATION N/A 08/15/2017   Procedure: CYSTOSCOP/ BLADDER BIOPSY/TURBT;  Surgeon: Nickie Retort, MD;  Location: [facility];  Service: Urology;  Laterality: N/A;  . CYSTOSCOPY WITH INSERTION OF UROLIFT N/A 06/23/2015   Procedure: CYSTOSCOPY WITH INSERTION OF UROLIFT;  Surgeon: Carolan Clines, MD;  Location: [facility];  Service: Urology;  Laterality: N/A;  . CYSTOSCOPY WITH STENT PLACEMENT Left 04/21/2018   Procedure: CYSTOSCOPY WITH STENT PLACEMENT;  Surgeon: Bjorn Loser, MD;  Location: [facility];  Service: Urology;  Laterality: Left;  Emergent case  . CYSTOSCOPY WITH URETEROSCOPY AND STENT PLACEMENT Left 04/07/2018   Procedure: CYSTOSCOPY WITH URETEROSCOPY AND  STENT EXCHANGE/ BLADDER BIOPSY/LASER ABLATION OF URETERAL TUMOR;  Surgeon: Ceasar Mons, MD;  Location[facility];  Service: Urology;  Laterality: Left;  ONLY NEEDS 60 MIN  . CYSTOSCOPY WITH URETEROSCOPY AND STENT PLACEMENT Left 11/28/2018   Procedure: CYSTOSCOPY WITH URETEROSCOPY , LEFT URETERAL AND BLADDER BIOPSY, LEFT STENT PLACEMENT;  Surgeon: Ceasar Mons, MD;  Location: [facility];  Service: Urology;  Laterality: Left;  . CYSTOSCOPY/URETEROSCOPY/HOLMIUM LASER/STENT PLACEMENT Bilateral 02/01/2017   Procedure: CYSTOSCOPY/URETEROSCOPY/HOLMIUM LASER/STENT PLACEMENT,STONE BASKETRY, BLADDER BIOPSY;  Surgeon: Nickie Retort, MD;  Location: [facility];  Service: Urology;  Laterality: Bilateral;  . ESOPHAGOGASTRODUODENOSCOPY  08-24-2006  . TRANSTHORACIC ECHOCARDIOGRAM  03/06/2018   mild LVH,  ef 55-60%,  pt in atrial fib. LV diastolic not evaluated/  mild LAE and RAE/  moderate AV calcification with sclerosis , no stenosis or regurg./  mild dilated aortic root, 35mm/  trival MR and TR  . TRANSURETHRAL INCISION OF PROSTATE N/A 04/01/2014   Procedure: TRANSURETHRAL INCISION OF THE PROSTATE (TUIP);  Surgeon: Ailene Rud, MD;  Location[facility];  Service: Urology;  Laterality: N/A;    Current Medications: Current Meds  Medication Sig  . ALPRAZolam (XANAX) 1 MG tablet Take 1 mg by mouth 2 (two) times daily.   Marland Kitchen apixaban (ELIQUIS) 5 MG TABS tablet Take 1 tablet (5 mg total) by mouth 2 (two) times daily.  Marland Kitchen esomeprazole (NEXIUM) 40 MG capsule Take 40 mg by mouth daily.   . fluconazole (DIFLUCAN) 150 MG tablet Take 2 tablets (300 mg total) by mouth once a week.  . gabapentin (NEURONTIN) 800 MG tablet Take 800 mg by mouth 3 (three) times daily.   Marland Kitchen levothyroxine (SYNTHROID, LEVOTHROID) 50 MCG tablet Take 1 tablet (50 mcg total) by mouth daily before breakfast.  . oxybutynin (DITROPAN) 5 MG tablet Take 1  tablet (5 mg total) by mouth every 8 (eight) hours as needed for bladder spasms.  . rosuvastatin (CRESTOR) 10 MG tablet Take 1 tablet (10 mg total) by mouth daily.  . tamsulosin (FLOMAX) 0.4 MG CAPS capsule Take 0.4 mg by mouth every evening.   . valACYclovir (VALTREX) 1000 MG tablet Take 1,000 mg by mouth 2 (two) times daily as needed. X 7 DAYS, THEN 500 MG BID X 6 MONTHS  . vitamin B-12 (CYANOCOBALAMIN) 1000 MCG tablet Take 1,000 mcg by mouth daily.  . [DISCONTINUED] HYDROcodone-acetaminophen (NORCO) 5-325 MG tablet Take 1 tablet by mouth every 4 (four) hours as needed for moderate pain.     Allergies:   Codeine   Social History   Socioeconomic History  . Marital status: Divorced    Spouse name: Not on file  . Number of children: Not on file  . Years of education: Not on file  . Highest education level: Not on file  Occupational History  . Not on file  Tobacco Use  . Smoking  status: Former Smoker    Packs/day: 1.00    Years: 25.00    Pack years: 25.00    Types: Cigarettes    Quit date: 06/01/2003    Years since quitting: 16.6  . Smokeless tobacco: Never Used  Vaping Use  . Vaping Use: Never used  Substance and Sexual Activity  . Alcohol use: No  . Drug use: No  . Sexual activity: Not on file  Other Topics Concern  . Not on file  Social History Narrative  . Not on file   Social Determinants of Health   Financial Resource Strain:   . Difficulty of Paying Living Expenses:   Food Insecurity:   . Worried About Charity fundraiser in the Last Year:   . Arboriculturist in the Last Year:   Transportation Needs:   . Film/video editor (Medical):   Marland Kitchen Lack of Transportation (Non-Medical):   Physical Activity:   . Days of Exercise per Week:   . Minutes of Exercise per Session:   Stress:   . Feeling of Stress :   Social Connections:   . Frequency of Communication with Friends and Family:   . Frequency of Social Gatherings with Friends and Family:   . Attends Religious  Services:   . Active Member of Clubs or Organizations:   . Attends Archivist Meetings:   Marland Kitchen Marital Status:      Family History:  The patient's   family history includes Anuerysm in his mother.   ROS:   Please see the history of present illness.    ROS All other systems reviewed and are negative.   PHYSICAL EXAM:   VS:  BP 122/72   Pulse 98   Ht 6\' 6"  (1.981 m)   Wt 226 lb (102.5 kg)   SpO2 97%   BMI 26.12 kg/m   Physical Exam  GEN: Well nourished, well developed, in no acute distress  Neck: no JVD, carotid bruits, or masses Cardiac:irreg; no murmurs, rubs, or gallops  Respiratory:  clear to auscultation bilaterally, normal work of breathing GI: soft, nontender, nondistended, + BS Ext: without cyanosis, clubbing, or edema, Decreased distal pulses bilaterally Neuro:  Alert and Oriented x 3 Psych: euthymic mood, full affect  Wt Readings from Last 3 Encounters:  01/02/20 226 lb (102.5 kg)  07/17/19 212 lb (96.2 kg)  02/12/19 225 lb (102.1 kg)      Studies/Labs Reviewed:   EKG:  EKG is not ordered today.    Recent Labs: No results found for requested labs within last 8760 hours.   Lipid Panel    Component Value Date/Time   CHOL 178 02/12/2019 0856   TRIG 126 02/12/2019 0856   HDL 40 02/12/2019 0856   CHOLHDL 4.5 02/12/2019 0856   CHOLHDL 4.5 05/10/2007 0305   VLDL 17 05/10/2007 0305   LDLCALC 115 (H) 02/12/2019 0856    Additional studies/ records that were reviewed today include:  Abd [place] 07/2019  Summary:  Abdominal Aorta:  No evidence of an abdominal aortic aneurysm was visualized.  The largest aortic diameter has decreased compared to prior exam.  Previous diameter measurement was 3.1 cm obtained on 07/2016. CT on11/2019  showed no aneursym.  IVC/Iliac: There is no evidence of thrombus involving the IVC.    *See table(s) above for measurements and observations.    Electronically signed by Larae Grooms MD on 08/20/2019 at 12:55:18 PM.          ASSESSMENT:  1. Paroxysmal atrial fibrillation ([place])   2. Coronary artery disease involving native coronary artery of native heart without angina pectoris   3. AAA (abdominal aortic aneurysm) without rupture ([place])   4. Hyperlipidemia LDL goal <70      PLAN:  In order of problems listed above:  PAF on Eliquis-has had bleeding in the past but tolerating now. Crt 1.0 and Hbg 16 in June 2021  CAD Stent dLAD, patent on cath 2003-atypical fleeting sharpchest pain at rest, no tightness when walking 2 miles/day  AAA on [place] 2018, no evidence of AAA on [place] 07/2019  HLD goal less than 70 LDL 122 11/2019-just started on Crestor 10 mg once daily. Needs FLP in 2 months.   Medication Adjustments/Labs and Tests Ordered: Current medicines are reviewed at length with the patient today.  Concerns regarding medicines are outlined above.  Medication changes, Labs and Tests ordered today are listed in the Patient Instructions below. Patient Instructions  Medication Instructions:  Your physician recommends that you continue on your current medications as directed. Please refer to the Current Medication list given to you today.  *If you need a refill on your cardiac medications before your next appointment, please call your pharmacy*   Lab Work: Fasting Lipid Panel 03/03/2020 Your physician recommends that you return for a FASTING lipid profile:   If you have labs (blood work) drawn today and your tests are completely normal, you will receive your results only by: Marland Kitchen MyChart Message (if you have MyChart) OR . A paper copy in the mail If you have any lab test that is abnormal or we need to change your treatment, we will call you to review the results.   Testing/Procedures: None   Follow-Up: At [facility], you and your health needs are our priority.  As part of our continuing mission to provide you with exceptional heart care, we have created designated Provider Care Teams.  These Care  Teams include your primary Cardiologist (physician) and Advanced Practice Providers (APPs -  Physician Assistants and Nurse Practitioners) who all work together to provide you with the care you need, when you need it.  We recommend signing up for the patient portal called "MyChart".  Sign up information is provided on this After Visit Summary.  MyChart is used to connect with patients for Virtual Visits (Telemedicine).  Patients are able to view lab/test results, encounter notes, upcoming appointments, etc.  Non-urgent messages can be sent to your provider as well.   To learn more about what you can do with MyChart, go to [email or web address].    Your next appointment:   6 month(s)  The format for your next appointment:   In Person  Provider:   You may see Dorris Carnes, MD or one of the following Advanced Practice Providers on your designated Care Team:    Richardson Dopp, PA-C  Robbie Lis, [place]    Other Instructions None     Signed, Ermalinda Barrios, Hershal Coria  01[address]02[address] [place], [place]  [address] Phone: [phone]; Fax: [phone]

## 2020-01-02 ENCOUNTER — Encounter: Payer: Self-pay | Admitting: Physician Assistant

## 2020-01-02 ENCOUNTER — Other Ambulatory Visit: Payer: Self-pay

## 2020-01-02 ENCOUNTER — Ambulatory Visit: Payer: Medicare Other | Admitting: Physician Assistant

## 2020-01-02 VITALS — BP 122/72 | HR 98 | Ht 78.0 in | Wt 226.0 lb

## 2020-01-02 DIAGNOSIS — I714 Abdominal aortic aneurysm, without rupture, unspecified: Secondary | ICD-10-CM

## 2020-01-02 DIAGNOSIS — I480 Paroxysmal atrial fibrillation: Secondary | ICD-10-CM | POA: Diagnosis not present

## 2020-01-02 DIAGNOSIS — I2510 Atherosclerotic heart disease of native coronary artery without angina pectoris: Secondary | ICD-10-CM | POA: Diagnosis not present

## 2020-01-02 DIAGNOSIS — E785 Hyperlipidemia, unspecified: Secondary | ICD-10-CM

## 2020-01-02 NOTE — Patient Instructions (Signed)
Medication Instructions:  Your physician recommends that you continue on your current medications as directed. Please refer to the Current Medication list given to you today.  *If you need a refill on your cardiac medications before your next appointment, please call your pharmacy*   Lab Work: Fasting Lipid Panel 03/03/2020 Your physician recommends that you return for a FASTING lipid profile:   If you have labs (blood work) drawn today and your tests are completely normal, you will receive your results only by: Marland Kitchen MyChart Message (if you have MyChart) OR . A paper copy in the mail If you have any lab test that is abnormal or we need to change your treatment, we will call you to review the results.   Testing/Procedures: None   Follow-Up: At [facility], you and your health needs are our priority.  As part of our continuing mission to provide you with exceptional heart care, we have created designated Provider Care Teams.  These Care Teams include your primary Cardiologist (physician) and Advanced Practice Providers (APPs -  Physician Assistants and Nurse Practitioners) who all work together to provide you with the care you need, when you need it.  We recommend signing up for the patient portal called "MyChart".  Sign up information is provided on this After Visit Summary.  MyChart is used to connect with patients for Virtual Visits (Telemedicine).  Patients are able to view lab/test results, encounter notes, upcoming appointments, etc.  Non-urgent messages can be sent to your provider as well.   To learn more about what you can do with MyChart, go to [email or web address].    Your next appointment:   6 month(s)  The format for your next appointment:   In Person  Provider:   You may see Dorris Carnes, MD or one of the following Advanced Practice Providers on your designated Care Team:    Richardson Dopp, PA-C  Robbie Lis, [place]    Other Instructions None

## 2020-02-12 NOTE — Progress Notes (Signed)
Cardiology Office Note   Date:  02/14/2020   ID:  Marvin, Gill [birth year], MRN [id number]  PCP:  Lavone Orn, MD  Cardiologist:   Dorris Carnes, MD   F/U of CAD    History of Present Illness: Marvin Gill is a 69 y.o. male with a history of CAD (s/p PTCA of LAD in 2000; last cath in 2003 normal coronary arteires , patent PTCA site), GERD, atrial fibrillation (in setting o sepsis), urosepsis.  Echo LVEF 55 to 60%^    Converted to SR  Followed by Dr Gilford Rile by urology for urothelial cancer [facility])   Denies hematuria    I saw the pt in clinic in Sept 2020  He was last seen by Gerrianne Scale on Jan 02 2020  Had occasional chest pains    The pt says he ran out of Crestor   Just started 2 wks ago   In July LDL 122  He denies CP except for rare fleeting/sharp with exertion   Probably musculoskeletal   With strenous activity he gets SOB but otherwise with usual acitivyt his breathing is OK  No dizzienss   Current Meds  Medication Sig  . ALPRAZolam (XANAX) 1 MG tablet Take 1 mg by mouth 2 (two) times daily.   Marland Kitchen apixaban (ELIQUIS) 5 MG TABS tablet Take 1 tablet (5 mg total) by mouth 2 (two) times daily.  Marland Kitchen doxycycline (VIBRAMYCIN) 100 MG capsule Take 100 mg by mouth daily.  . DULoxetine (CYMBALTA) 30 MG capsule Take 30 mg by mouth daily.  Marland Kitchen esomeprazole (NEXIUM) 40 MG capsule Take 40 mg by mouth daily.   . fluconazole (DIFLUCAN) 150 MG tablet Take 2 tablets (300 mg total) by mouth once a week.  . gabapentin (NEURONTIN) 800 MG tablet Take 800 mg by mouth 3 (three) times daily.   Marland Kitchen levothyroxine (SYNTHROID, LEVOTHROID) 50 MCG tablet Take 1 tablet (50 mcg total) by mouth daily before breakfast.  . oxybutynin (DITROPAN) 5 MG tablet Take 1 tablet (5 mg total) by mouth every 8 (eight) hours as needed for bladder spasms.  . rosuvastatin (CRESTOR) 10 MG tablet Take 1 tablet (10 mg total) by mouth daily.  . solifenacin (VESICARE) 10 MG tablet Take 10 mg by mouth daily.  . tamsulosin  (FLOMAX) 0.4 MG CAPS capsule Take 0.4 mg by mouth every evening.   . valACYclovir (VALTREX) 1000 MG tablet Take 1,000 mg by mouth 2 (two) times daily as needed. X 7 DAYS, THEN 500 MG BID X 6 MONTHS  . vitamin B-12 (CYANOCOBALAMIN) 1000 MCG tablet Take 1,000 mcg by mouth daily.     Allergies:   Codeine   Past Medical History:  Diagnosis Date  . Aneurysm of abdominal aorta (HCC)    a. ultasound 08-04-16 --- 3.1 CM X 3.1 CM - sees Dr. Laurann Montana - F/u due 2021.  Marland Kitchen Anxiety   . Benign localized prostatic hyperplasia with lower urinary tract symptoms (LUTS)   . Bladder cancer [facility]) urologsit-  dr winter   BCG treatment's   . COPD with emphysema ([place])   . Coronary artery disease primary cardiologist-- dr Arville Lime   a. s/p PTCA to dLAD 2000. b. Normal coronaries in 2003, patent prior PTCA site.  . Dizziness    intermittant  . Dysrhythmia 2019   AF-Eliquis started  . Dysuria   . Feeling light headed    intermittant  . Fibromyalgia   . GERD (gastroesophageal reflux disease)   . History of kidney  stones   . History of skin cancer   . Hypothyroidism   . OA (osteoarthritis)    back, shoulders , hips  . PAF (paroxysmal atrial fibrillation) ([place]) 03/05/2018   dx in setting sepsis---  cardiologist-- dr Arville Lime (per dr Denman George note in epic pt was to stop eliquis due to hematuria last dose 09-05-2018)  . PONV (postoperative nausea and vomiting)    NAUSEA ALL DAY AFTER 02-01-17 SURGERY, PT WANTS ANESTHESIA LIKE 06-23-15 SURGERY WITH DR  [name]  . S/P percutaneous transluminal coronary angioplasty    05-18-1999  to dLAD  . Wears glasses     Past Surgical History:  Procedure Laterality Date  . CARDIAC CATHETERIZATION  06-14-2001  dr Einar Gip   normal coronary arteries and patent previous dLAD   . CARDIOVASCULAR STRESS TEST  06/12/2008   Low risk nuclear study w/ no evidence ischemia/  normal LV funciton and wall motion , ef 60%  . COLONOSCOPY WITH PROPOFOL N/A 12/13/2013   Procedure:  COLONOSCOPY WITH PROPOFOL;  Surgeon: Garlan Fair, MD;  Location: [facility];  Service: Endoscopy;  Laterality: N/A;  . CORONARY ANGIOPLASTY  05-18-1999  dr Tami Ribas   PTCA balloon to dLAD 80% (single vessel disease),  ef 60%  . CYSTOSCOPY WITH FULGERATION N/A 08/15/2017   Procedure: CYSTOSCOP/ BLADDER BIOPSY/TURBT;  Surgeon: Nickie Retort, MD;  Location: [facility];  Service: Urology;  Laterality: N/A;  . CYSTOSCOPY WITH INSERTION OF UROLIFT N/A 06/23/2015   Procedure: CYSTOSCOPY WITH INSERTION OF UROLIFT;  Surgeon: Carolan Clines, MD;  Location: [facility];  Service: Urology;  Laterality: N/A;  . CYSTOSCOPY WITH STENT PLACEMENT Left 04/21/2018   Procedure: CYSTOSCOPY WITH STENT PLACEMENT;  Surgeon: Bjorn Loser, MD;  Location: [place];  Service: Urology;  Laterality: Left;  Emergent case  . CYSTOSCOPY WITH URETEROSCOPY AND STENT PLACEMENT Left 04/07/2018   Procedure: CYSTOSCOPY WITH URETEROSCOPY AND STENT EXCHANGE/ BLADDER BIOPSY/LASER ABLATION OF URETERAL TUMOR;  Surgeon: Ceasar Mons, MD;  Location: [facility];  Service: Urology;  Laterality: Left;  ONLY NEEDS 60 MIN  . CYSTOSCOPY WITH URETEROSCOPY AND STENT PLACEMENT Left 11/28/2018   Procedure: CYSTOSCOPY WITH URETEROSCOPY , LEFT URETERAL AND BLADDER BIOPSY, LEFT STENT PLACEMENT;  Surgeon: Ceasar Mons, MD;  Location: [facility];  Service: Urology;  Laterality: Left;  . CYSTOSCOPY/URETEROSCOPY/HOLMIUM LASER/STENT PLACEMENT Bilateral 02/01/2017   Procedure: CYSTOSCOPY/URETEROSCOPY/HOLMIUM LASER/STENT PLACEMENT,STONE BASKETRY, BLADDER BIOPSY;  Surgeon: Nickie Retort, MD;  Location: [facility];  Service: Urology;  Laterality: Bilateral;  . ESOPHAGOGASTRODUODENOSCOPY  08-24-2006  . TRANSTHORACIC ECHOCARDIOGRAM  03/06/2018   mild LVH,  ef 55-60%,  pt in atrial fib. LV diastolic not evaluated/  mild LAE and RAE/  moderate AV calcification with  sclerosis , no stenosis or regurg./  mild dilated aortic root, 71mm/  trival MR and TR  . TRANSURETHRAL INCISION OF PROSTATE N/A 04/01/2014   Procedure: TRANSURETHRAL INCISION OF THE PROSTATE (TUIP);  Surgeon: Ailene Rud, MD;  Location: [facility];  Service: Urology;  Laterality: N/A;     Social History:  The patient  reports that he quit smoking about 16 years ago. His smoking use included cigarettes. He has a 25.00 pack-year smoking history. He has never used smokeless tobacco. He reports that he does not drink alcohol and does not use drugs.   Family History:  The patient's family history includes Anuerysm in his mother.    ROS:  Please see the history of present illness.  All other systems are reviewed and  Negative to the above problem except as noted.    PHYSICAL EXAM: VS:  BP 128/86   Pulse 86   Ht 6\' 6"  (1.981 m)   Wt 221 lb 9.6 oz (100.5 kg)   SpO2 96%   BMI 25.61 kg/m   GEN: Well nourished, well developed, in no acute distress  HEENT: normal  Neck: JVP is not elevated   Cardiac: RRR; no murmurs,no edema  Respiratory:  clear to auscultation bilaterally, normal work of breathing GI: soft, nontender, nondistended, + BS  No hepatomegaly  MS: no deformity Moving all extremities   Skin: warm and dry, no rash Neuro:  Strength and sensation are intact Psych: euthymic mood, full affect   EKG:  EKG is not ordered today.   Lipid Panel    Component Value Date/Time   CHOL 178 02/12/2019 0856   TRIG 126 02/12/2019 0856   HDL 40 02/12/2019 0856   CHOLHDL 4.5 02/12/2019 0856   CHOLHDL 4.5 05/10/2007 0305   VLDL 17 05/10/2007 0305   LDLCALC 115 (H) 02/12/2019 0856      Wt Readings from Last 3 Encounters:  02/14/20 221 lb 9.6 oz (100.5 kg)  01/02/20 226 lb (102.5 kg)  07/17/19 212 lb (96.2 kg)      ASSESSMENT AND PLAN:   CAD   No symptoms of angina   Sharp chest pains probably musculoskel  2  PAF  Reg rehythm now   Keep on Eliqiuis and  diltiazem   Make sure he had a CBC    3  HL  Crestor restarted 2 wks ago   Will need lipitds in 6 wks with AST   If CBC has not been checked check at taht tim    F/U in 6 months     Current medicines are reviewed at length with the patient today.  The patient does not have concerns regarding medicines.  Signed, Dorris Carnes, MD  02/14/2020 9:34 AM    [address], [place], [place]  [address] Phone: [phone]; Fax: [phone]

## 2020-02-14 ENCOUNTER — Encounter: Payer: Self-pay | Admitting: Internal Medicine

## 2020-02-14 ENCOUNTER — Other Ambulatory Visit: Payer: Self-pay

## 2020-02-14 ENCOUNTER — Ambulatory Visit (INDEPENDENT_AMBULATORY_CARE_PROVIDER_SITE_OTHER): Payer: Medicare Other | Admitting: Internal Medicine

## 2020-02-14 VITALS — BP 128/86 | HR 86 | Ht 78.0 in | Wt 221.6 lb

## 2020-02-14 DIAGNOSIS — E785 Hyperlipidemia, unspecified: Secondary | ICD-10-CM

## 2020-02-14 NOTE — Patient Instructions (Signed)
Medication Instructions:  No changes *If you need a refill on your cardiac medications before your next appointment, please call your pharmacy*   Lab Work: Lipids in 6 weeks.   If you have labs (blood work) drawn today and your tests are completely normal, you will receive your results only by: Marland Kitchen MyChart Message (if you have MyChart) OR . A paper copy in the mail If you have any lab test that is abnormal or we need to change your treatment, we will call you to review the results.   Testing/Procedures: none   Follow-Up: At [facility], you and your health needs are our priority.  As part of our continuing mission to provide you with exceptional heart care, we have created designated Provider Care Teams.  These Care Teams include your primary Cardiologist (physician) and Advanced Practice Providers (APPs -  Physician Assistants and Nurse Practitioners) who all work together to provide you with the care you need, when you need it.  We recommend signing up for the patient portal called "MyChart".  Sign up information is provided on this After Visit Summary.  MyChart is used to connect with patients for Virtual Visits (Telemedicine).  Patients are able to view lab/test results, encounter notes, upcoming appointments, etc.  Non-urgent messages can be sent to your provider as well.   To learn more about what you can do with MyChart, go to [email or web address].    Your next appointment:   6 month(s)  The format for your next appointment:   In Person  Provider:   You may see Dorris Carnes, MD or one of the following Advanced Practice Providers on your designated Care Team:    Richardson Dopp, PA-C  Robbie Lis, [place]   Other Instructions

## 2020-03-03 ENCOUNTER — Other Ambulatory Visit: Payer: Medicare Other

## 2020-03-20 ENCOUNTER — Telehealth: Payer: Self-pay | Admitting: *Deleted

## 2020-03-20 ENCOUNTER — Other Ambulatory Visit: Payer: Self-pay | Admitting: Physical Medicine and Rehabilitation

## 2020-03-20 ENCOUNTER — Other Ambulatory Visit: Payer: Self-pay | Admitting: Internal Medicine

## 2020-03-20 DIAGNOSIS — M5416 Radiculopathy, lumbar region: Secondary | ICD-10-CM

## 2020-03-20 NOTE — Telephone Encounter (Signed)
   [facility] Pre-operative Risk Assessment    HEARTCARE STAFF: - Please ensure there is not already an duplicate clearance open for this procedure. - Under Visit Info/Reason for Call, type in Other and utilize the format Clearance MM/DD/YY or Clearance TBD. Do not use dashes or single digits. - If request is for dental extraction, please clarify the # of teeth to be extracted.  Request for surgical clearance:  1. What type of surgery is being performed? NERVE ROOT BLOCK   2. When is this surgery scheduled? TBD   3. What type of clearance is required (medical clearance vs. Pharmacy clearance to hold med vs. Both)? PHARM  4. Are there any medications that need to be held prior to surgery and how long? ELIQUIS x 2 DAYS PRIOR TO PROCEDURE   5. Practice name and name of physician performing surgery? [facility]   6. What is the office phone number? [phone]   7.   What is the office fax number? [phone]  8.   Anesthesia type (None, local, MAC, general) ? LOCAL   Julaine Hua 03/20/2020, 1:08 PM  _________________________________________________________________   (provider comments below)

## 2020-03-20 NOTE — Telephone Encounter (Signed)
CrCl correction = 75ml/min

## 2020-03-20 NOTE — Telephone Encounter (Signed)
Patient with diagnosis of ATRIAL FIBRILLATION on ELIQUIS for anticoagulation.    Procedure: NERVE ROOT BLOCK Date of procedure: TBD    CHA2DS2-VASc Score = 2  This indicates a 2.2% annual risk of stroke. The patient's score is based upon: CHF History: 0 HTN History: 0 Diabetes History: 0 Stroke History: 0 Vascular Disease History: 1 Age Score: 1 Gender Score: 0   CrCl = 83 ML/MIN  Per office protocol, patient can hold ELIQUIS for 3 days prior to procedure.

## 2020-03-21 NOTE — Telephone Encounter (Signed)
   Primary Cardiologist: Dorris Carnes, MD  Chart reviewed as part of pre-operative protocol coverage. Patient scheduled for upcoming nerve root block and we were asked to give recommendations for holding Eliquis.   Per Pharmacy and office protocol, "Patient can hold Eliquis for 3 days prior to procedure." Please restart as soon as it is felt safe following procedure.   I will route this recommendation to the requesting party via Epic fax function and remove from pre-op pool.  Please call with questions.  Darreld Mclean, PA-C 03/21/2020, 8:18 AM

## 2020-03-27 ENCOUNTER — Other Ambulatory Visit: Payer: Medicare Other

## 2020-03-28 ENCOUNTER — Inpatient Hospital Stay: Admission: RE | Admit: 2020-03-28 | Payer: Medicare Other | Source: Ambulatory Visit

## 2020-04-01 ENCOUNTER — Other Ambulatory Visit: Payer: Medicare Other

## 2020-04-03 NOTE — Discharge Instructions (Signed)

## 2020-04-04 ENCOUNTER — Ambulatory Visit
Admission: RE | Admit: 2020-04-04 | Discharge: 2020-04-04 | Disposition: A | Discharge status: Home / Self Care | Payer: Medicare Other | Source: Ambulatory Visit | Attending: Physical Medicine and Rehabilitation | Admitting: Physical Medicine and Rehabilitation

## 2020-04-04 ENCOUNTER — Other Ambulatory Visit: Payer: Self-pay

## 2020-04-04 DIAGNOSIS — M5416 Radiculopathy, lumbar region: Secondary | ICD-10-CM

## 2020-04-04 MED ORDER — METHYLPREDNISOLONE ACETATE 40 MG/ML INJ SUSP (RADIOLOG
120.0000 mg | Freq: Once | INTRAMUSCULAR | Status: AC
  Administered 2020-04-04: 120 mg via EPIDURAL

## 2020-04-04 MED ORDER — IOPAMIDOL (ISOVUE-M 200) INJECTION 41%
1.0000 mL | Freq: Once | INTRAMUSCULAR | Status: AC
  Administered 2020-04-04: 1 mL via EPIDURAL

## 2020-04-25 ENCOUNTER — Other Ambulatory Visit: Payer: Self-pay | Admitting: Internal Medicine

## 2020-04-28 NOTE — Telephone Encounter (Signed)
Pt last saw Dr Harrington Challenger 02/14/20, last labs 11/09/19 Creat 1.0 at [facility], age 69, weight 100.5kg, based on specified criteria pt is on appropriate dosage of Eliquis 5mg  BID.  Will refill rx.

## 2020-05-15 ENCOUNTER — Other Ambulatory Visit: Payer: Self-pay | Admitting: Internal Medicine

## 2020-05-15 DIAGNOSIS — N183 Chronic kidney disease, stage 3 unspecified: Secondary | ICD-10-CM

## 2020-05-20 ENCOUNTER — Other Ambulatory Visit: Payer: Medicare Other

## 2020-05-21 ENCOUNTER — Ambulatory Visit
Admission: RE | Admit: 2020-05-21 | Discharge: 2020-05-21 | Disposition: A | Discharge status: Home / Self Care | Payer: Medicare Other | Source: Ambulatory Visit | Attending: Internal Medicine | Admitting: Internal Medicine

## 2020-05-21 DIAGNOSIS — N183 Chronic kidney disease, stage 3 unspecified: Secondary | ICD-10-CM

## 2020-05-22 ENCOUNTER — Other Ambulatory Visit: Payer: Medicare Other

## 2020-06-13 IMAGING — CT CT RENAL STONE PROTOCOL
2 of 4 series · 15 of 46 positions shown, 17 images · non-contrast
Comparison: CT of the abdomen and pelvis 07/22/2017.

CLINICAL DATA: 59-year-old male with back pain and gross hematuria.
History of kidney stones.

EXAM:
CT ABDOMEN AND PELVIS WITHOUT CONTRAST
TECHNIQUE: Multidetector CT imaging of the abdomen and pelvis was performed
following the standard protocol without IV contrast.

[Series 3: ap without · axial · non-contrast · 0.70mm/px · z∈[+978,+1418]mm · 12 of 98 slices shown, 14 images]
[im 5/98  soft-tissue]
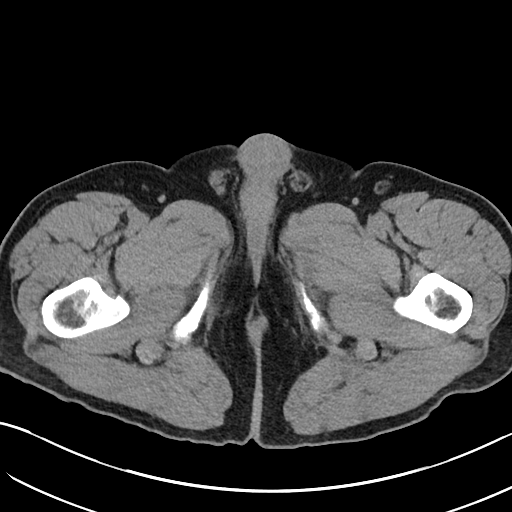
[im 5/98  bone]
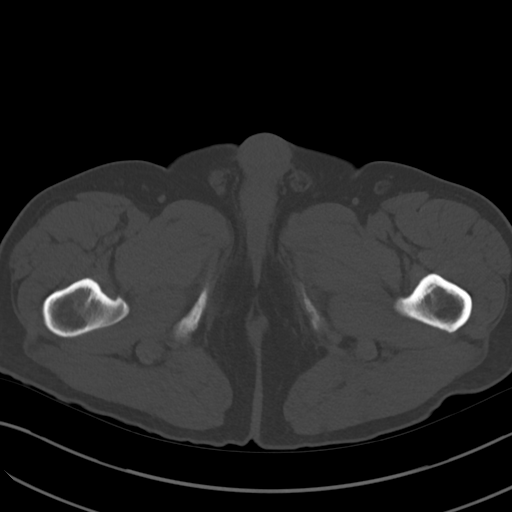
[im 15/98  soft-tissue]
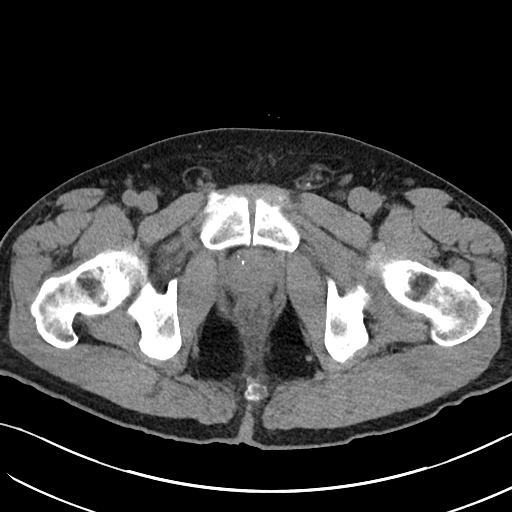
[im 20/98  soft-tissue]
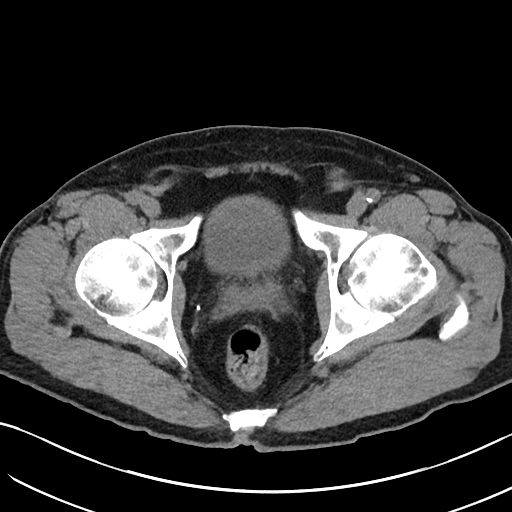
[im 30/98  soft-tissue]
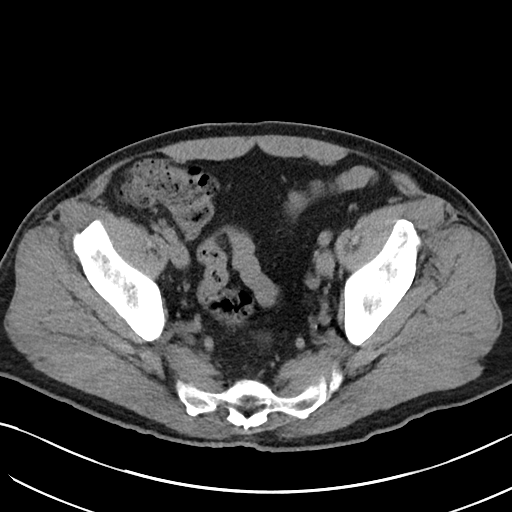
[im 39/98  soft-tissue]
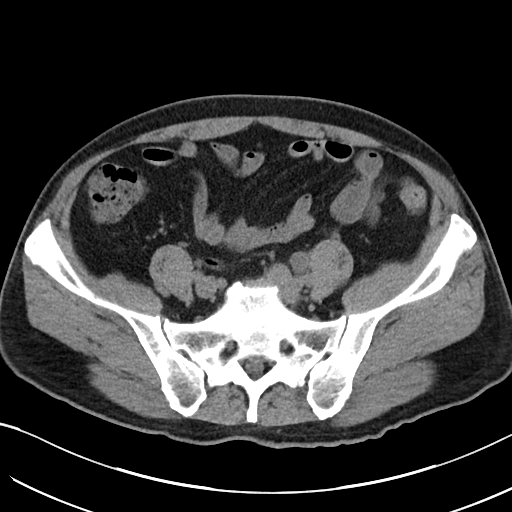
[im 44/98  soft-tissue]
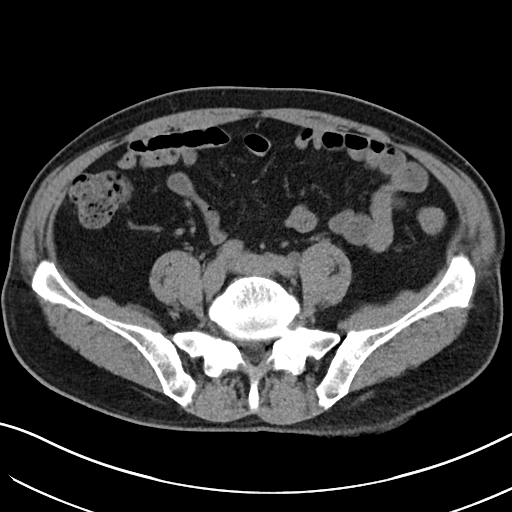
[im 54/98  soft-tissue]
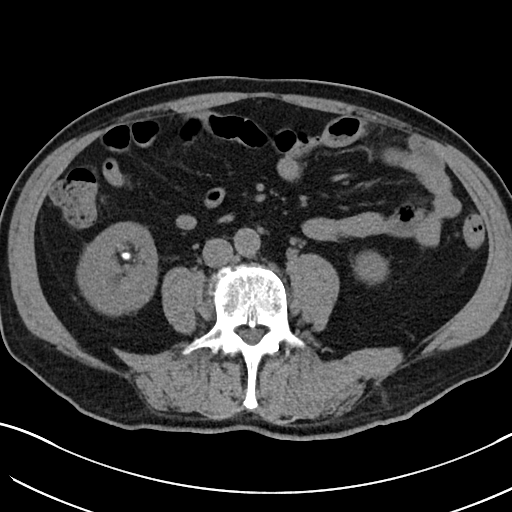
[im 59/98  soft-tissue]
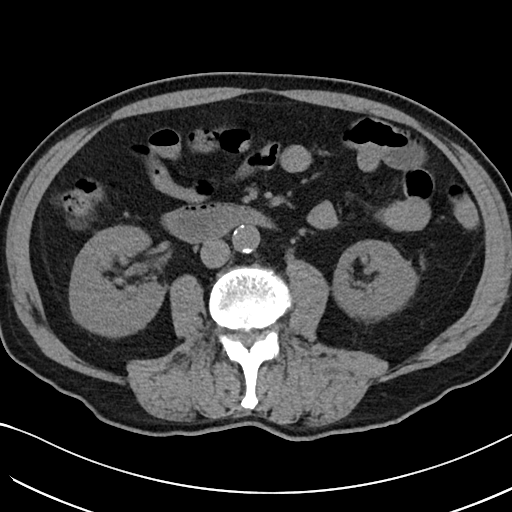
[im 68/98  soft-tissue]
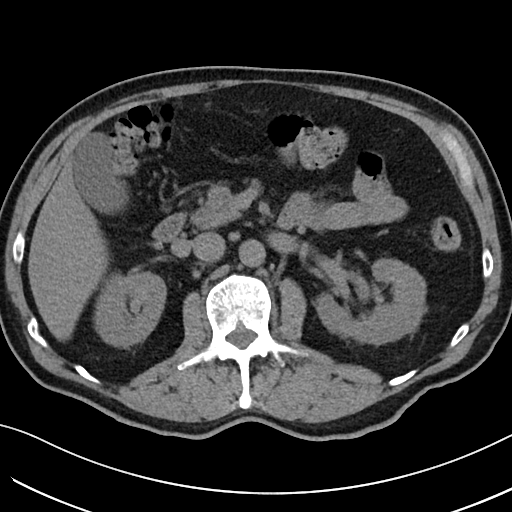
[im 68/98  bone]
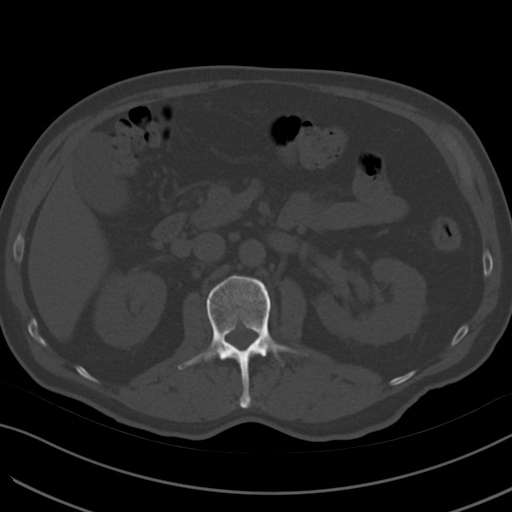
[im 78/98  soft-tissue]
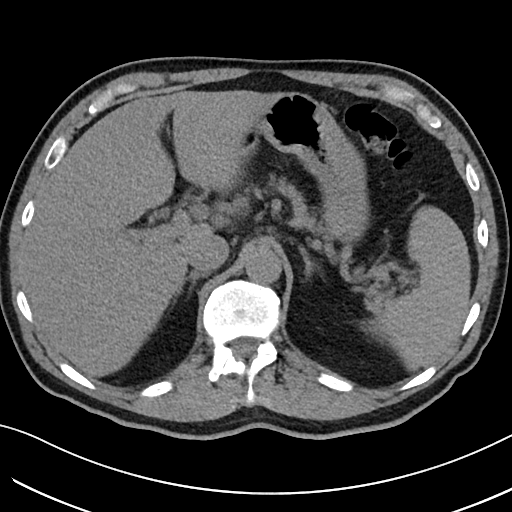
[im 83/98  soft-tissue]
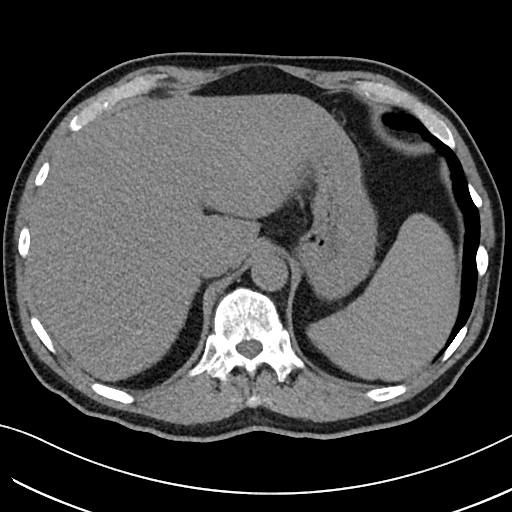
[im 93/98  soft-tissue]
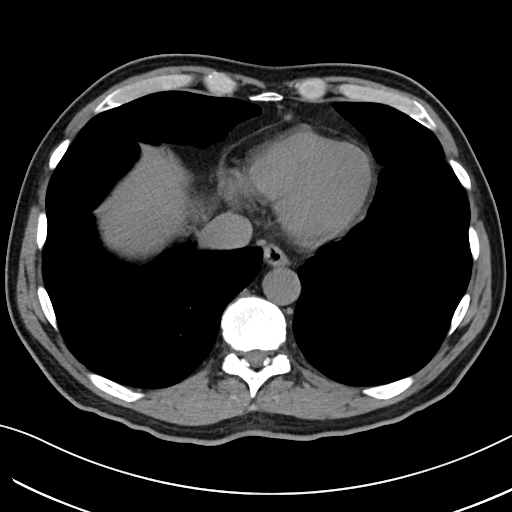

[Series 6: cor · coronal · 0.72mm/px · 3 of 90 slices shown]
[im 30/90  soft-tissue]
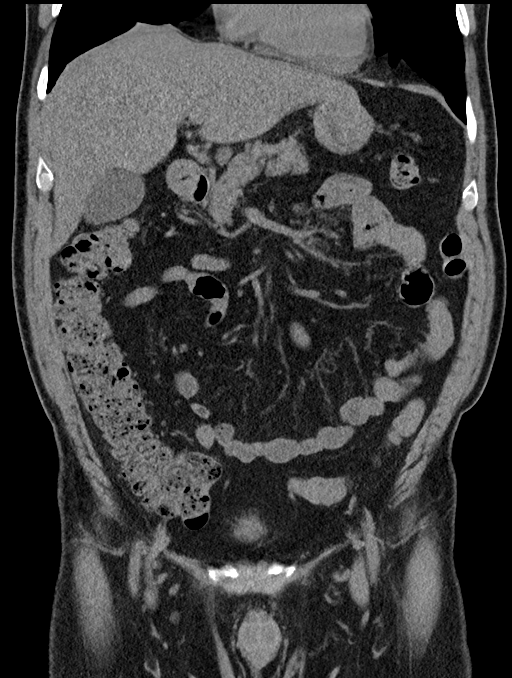
[im 40/90  soft-tissue]
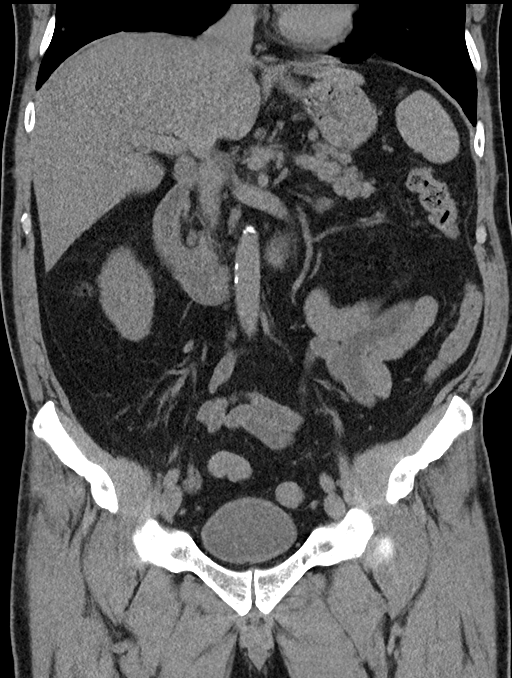
[im 50/90  soft-tissue]
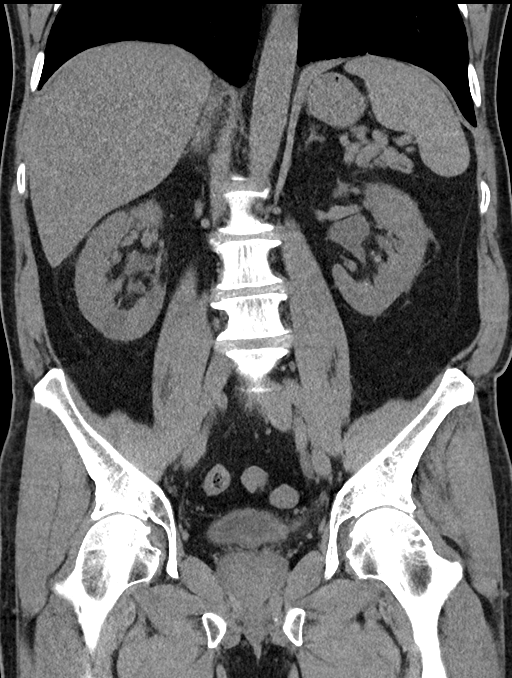

[15 of 46 positions shown; findings below may reference images not displayed]

FINDINGS: Lower chest: 4 mm right lower lobe pulmonary nodule (axial image 10
of series 5). 3 mm left lower lobe nodule (axial image 2 of series
5). Both of these are unchanged on prior examinations dating back to
12/02/2016, considered definitively benign.

Hepatobiliary: Mild diffuse low attenuation throughout the hepatic
parenchyma, indicative of hepatic steatosis. No definite cystic or
solid hepatic lesions are confidently identified on today's
noncontrast CT examination. Unenhanced appearance of the gallbladder
is normal.

Pancreas: No definite pancreatic mass or peripancreatic fluid or
inflammatory changes are noted on today's noncontrast CT
examination.

Spleen: Unremarkable.

Adrenals/Urinary Tract: There are multiple nonobstructive calculi
within the collecting systems of both kidneys, largest of which is
in the lower pole collecting system of the right kidney measuring 11
mm. Patient no additional calculi are noted along the course of
either ureter. However, there is mild left hydroureteronephrosis
which extends all the way to the level of the left ureterovesicular
junction. No calculi are noted within the lumen of the urinary
bladder. Urinary bladder is otherwise unremarkable in appearance. No
definite renal lesions. Bilateral adrenal glands are normal in
appearance.

Stomach/Bowel: Normal appearance of the stomach. No pathologic
dilatation of small bowel or colon. Normal appendix.

Vascular/Lymphatic: Aortic atherosclerosis. No lymphadenopathy noted
in the abdomen or pelvis.

Reproductive: Small fiducial markers or brachytherapy implants are
noted in the prostate gland. Seminal vesicles are unremarkable in
appearance.

Other: No significant volume of ascites.  No pneumoperitoneum.

Musculoskeletal: There are no aggressive appearing lytic or blastic
lesions noted in the visualized portions of the skeleton.
IMPRESSION: 1. Multiple nonobstructive calculi are noted within the collecting
systems of both kidneys measuring up to 11 mm in the lower pole
collecting system of the right kidney.
2. Mild left-sided hydroureteronephrosis which extends all the way
to the level of the ureterovesicular junction. No definite left
ureteral stone is identified. This could suggest a left UVJ
stricture, potentially related to prior passage of a stone.
Alternatively, a non radiopaque calculus could be present at the
left ureterovesicular junction.
3. Mild hepatic steatosis.
4. Aortic atherosclerosis.

## 2020-06-21 ENCOUNTER — Other Ambulatory Visit: Payer: Self-pay | Admitting: Internal Medicine

## 2020-07-03 ENCOUNTER — Other Ambulatory Visit: Payer: Self-pay | Admitting: Internal Medicine

## 2020-07-03 ENCOUNTER — Other Ambulatory Visit: Payer: Self-pay | Admitting: Physical Medicine and Rehabilitation

## 2020-07-03 ENCOUNTER — Telehealth: Payer: Self-pay | Admitting: *Deleted

## 2020-07-03 DIAGNOSIS — M5416 Radiculopathy, lumbar region: Secondary | ICD-10-CM

## 2020-07-03 NOTE — Telephone Encounter (Signed)
   [facility] Pre-operative Risk Assessment    HEARTCARE STAFF: - Please ensure there is not already an duplicate clearance open for this procedure. - Under Visit Info/Reason for Call, type in Other and utilize the format Clearance MM/DD/YY or Clearance TBD. Do not use dashes or single digits. - If request is for dental extraction, please clarify the # of teeth to be extracted.  Request for surgical clearance:  1. What type of surgery is being performed? LUMBAR NERVE ROOT BLOCK   2. When is this surgery scheduled? TBD   3. What type of clearance is required (medical clearance vs. Pharmacy clearance to hold med vs. Both)? BOTH  4. Are there any medications that need to be held prior to surgery and how long? ELIQUIS x 2 DAYS PRIOR   5. Practice name and name of physician performing surgery? [facility]; MD IS NOT LISTED   6. What is the office phone number? [phone]   7.   What is the office fax number? [phone]  8.   Anesthesia type (None, local, MAC, general) ? LOCAL   Julaine Hua 07/03/2020, 4:51 PM  _________________________________________________________________   (provider comments below)

## 2020-07-04 NOTE — Telephone Encounter (Signed)
   Primary Cardiologist: Dorris Carnes, MD  Chart reviewed as part of pre-operative protocol coverage.   Left a voicemail for patient to call back for ongoing preop assessment.  Abigail Butts, PA-C 07/04/2020, 4:58 PM

## 2020-07-04 NOTE — Telephone Encounter (Signed)
Patient with diagnosis of atrial fibrillation on Eliquis for anticoagulation.    Procedure: lumbar nerve root block Date of procedure: TBD   CHA2DS2-VASc Score = 2  This indicates a 2.2% annual risk of stroke. The patient's score is based upon: CHF History: No HTN History: No Diabetes History: No Stroke History: No Vascular Disease History: Yes Age Score: 1 Gender Score: 0   CrCl 96.3 Platelet count 242 (2019)  Patient will need CBC to determine updated platelet count.  If comes back WNL:  Per office protocol, patient can hold Eliquis for 3 days prior to procedure. (Note - [facility] is asking for 2 day hold, we always do 3 day hold for spinal procedures)    Patient will not need bridging with Lovenox (enoxaparin) around procedure.

## 2020-07-07 NOTE — Telephone Encounter (Signed)
Patient returning call.

## 2020-07-07 NOTE — Telephone Encounter (Signed)
Left VM

## 2020-07-08 ENCOUNTER — Other Ambulatory Visit: Payer: Self-pay | Admitting: Physician Assistant

## 2020-07-08 ENCOUNTER — Other Ambulatory Visit: Payer: Self-pay

## 2020-07-08 ENCOUNTER — Other Ambulatory Visit: Payer: Medicare Other

## 2020-07-08 ENCOUNTER — Other Ambulatory Visit: Payer: Self-pay | Admitting: Internal Medicine

## 2020-07-08 DIAGNOSIS — I480 Paroxysmal atrial fibrillation: Secondary | ICD-10-CM

## 2020-07-08 LAB — CBC
Hematocrit: 40.1 % (ref 37.5–51.0)
Hemoglobin: 13.7 g/dL (ref 13.0–17.7)
MCH: 30 pg (ref 26.6–33.0)
MCHC: 34.2 g/dL (ref 31.5–35.7)
MCV: 88 fL (ref 79–97)
Platelets: 245 10*3/uL (ref 150–450)
RBC: 4.57 x10E6/uL (ref 4.14–5.80)
RDW: 15.1 % (ref 11.6–15.4)
WBC: 7 10*3/uL (ref 3.4–10.8)

## 2020-07-08 NOTE — Telephone Encounter (Signed)
   Primary Cardiologist: Dorris Carnes, MD  Chart reviewed as part of pre-operative protocol coverage. Patient was contacted 07/08/2020 in reference to pre-operative risk assessment for pending surgery as outlined below.  DMITRI PETTIGREW was last seen on 02/14/20 by Dr. Harrington Challenger.  Since that day, DONNELLE OLMEDA has done well. He can complete 4.0 METS without angina. He is primarily limited by back pain. 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  Per our clinical pharmacist: Per office protocol, patient can hold Eliquis for 3 days prior to procedure. (Note - [facility] is asking for 2 day hold, we always do 3 day hold for spinal procedures)  Therefore, based on ACC/AHA guidelines, the patient would be at acceptable risk for the planned procedure without further cardiovascular testing.   The patient was advised that if he develops new symptoms prior to surgery to contact our office to arrange for a follow-up visit, and he verbalized understanding.  I will route this recommendation to the requesting party via Epic fax function and remove from pre-op pool. Please call with questions.  Tami Lin Mina Babula, PA 07/08/2020, 8:31 AM

## 2020-07-08 NOTE — Telephone Encounter (Signed)
Patient returning call.

## 2020-07-08 NOTE — Telephone Encounter (Signed)
I s/w the pt in regards to lab work to be done (CBC). Pt said he will be in today sometime between 2-3 pm.

## 2020-07-08 NOTE — Telephone Encounter (Signed)
Left VM

## 2020-07-14 ENCOUNTER — Inpatient Hospital Stay
Admission: RE | Admit: 2020-07-14 | Discharge: 2020-07-14 | Disposition: A | Discharge status: Home / Self Care | Payer: Medicare Other | Source: Ambulatory Visit | Attending: Physical Medicine and Rehabilitation | Admitting: Physical Medicine and Rehabilitation

## 2020-07-14 NOTE — Discharge Instructions (Signed)
Post Procedure Spinal Discharge Instruction Sheet  1. You may resume a regular diet and any medications that you routinely take (including pain medications).  2. No driving day of procedure.  3. Light activity throughout the rest of the day.  Do not do any strenuous work, exercise, bending or lifting.  The day following the procedure, you can resume normal physical activity but you should refrain from exercising or physical therapy for at least three days thereafter.   Common Side Effects:   Headaches- take your usual medications as directed by your physician.  Increase your fluid intake.  Caffeinated beverages may be helpful.  Lie flat in bed until your headache resolves.   Restlessness or inability to sleep- you may have trouble sleeping for the next few days.  Ask your referring physician if you need any medication for sleep.   Facial flushing or redness- should subside within a few days.   Increased pain- a temporary increase in pain a day or two following your procedure is not unusual.  Take your pain medication as prescribed by your referring physician.   Leg cramps  Please contact our office at [phone] for the following symptoms:  Fever greater than 100 degrees.  Headaches unresolved with medication after 2-3 days.  Increased swelling, pain, or redness at injection site.  RESUME YOUR ELIQUIS IN 24-48 HOURS AFTER YOUR PROCEDURE (ANY TIME BETWEEN TOMORROW 07/15/20 @ 2PM AND THE NEXT DAY 07/16/20 @ 2PM)

## 2020-07-18 ENCOUNTER — Other Ambulatory Visit: Payer: Self-pay | Admitting: Physical Medicine and Rehabilitation

## 2020-07-18 ENCOUNTER — Ambulatory Visit
Admission: RE | Admit: 2020-07-18 | Discharge: 2020-07-18 | Disposition: A | Discharge status: Home / Self Care | Payer: Medicare Other | Source: Ambulatory Visit | Attending: Physical Medicine and Rehabilitation | Admitting: Physical Medicine and Rehabilitation

## 2020-07-18 ENCOUNTER — Other Ambulatory Visit: Payer: Self-pay

## 2020-07-18 DIAGNOSIS — M5416 Radiculopathy, lumbar region: Secondary | ICD-10-CM

## 2020-07-18 DIAGNOSIS — M47817 Spondylosis without myelopathy or radiculopathy, lumbosacral region: Secondary | ICD-10-CM | POA: Diagnosis not present

## 2020-07-18 MED ORDER — IOPAMIDOL (ISOVUE-M 200) INJECTION 41%
1.0000 mL | Freq: Once | INTRAMUSCULAR | Status: AC
  Administered 2020-07-18: 1 mL via EPIDURAL

## 2020-07-18 MED ORDER — METHYLPREDNISOLONE ACETATE 40 MG/ML INJ SUSP (RADIOLOG
120.0000 mg | Freq: Once | INTRAMUSCULAR | Status: AC
  Administered 2020-07-18: 120 mg via EPIDURAL

## 2020-07-18 NOTE — Discharge Instructions (Signed)
Post Procedure Spinal Discharge Instruction Sheet  1. You may resume a regular diet and any medications that you routinely take (including pain medications).  2. No driving day of procedure.  3. Light activity throughout the rest of the day.  Do not do any strenuous work, exercise, bending or lifting.  The day following the procedure, you can resume normal physical activity but you should refrain from exercising or physical therapy for at least three days thereafter.   Common Side Effects:   Headaches- take your usual medications as directed by your physician.  Increase your fluid intake.  Caffeinated beverages may be helpful.  Lie flat in bed until your headache resolves.   Restlessness or inability to sleep- you may have trouble sleeping for the next few days.  Ask your referring physician if you need any medication for sleep.   Facial flushing or redness- should subside within a few days.   Increased pain- a temporary increase in pain a day or two following your procedure is not unusual.  Take your pain medication as prescribed by your referring physician.   Leg cramps  Please contact our office at [phone] for the following symptoms:  Fever greater than 100 degrees.  Headaches unresolved with medication after 2-3 days.  Increased swelling, pain, or redness at injection site.  RESUME ELIQUIS IN 24-48 HOURS AFTER YOUR PROCEDURE STARTING TOMORROW 07/19/20 @ 11AM.

## 2020-08-04 ENCOUNTER — Telehealth: Payer: Self-pay | Admitting: Internal Medicine

## 2020-08-04 NOTE — Telephone Encounter (Signed)
Pt c/o Shortness Of Breath: STAT if SOB developed within the last 24 hours or pt is noticeably SOB on the phone  1. Are you currently SOB (can you hear that pt is SOB on the phone)? No   2. How long have you been experiencing SOB? 1 week  3. Are you SOB when sitting or when up moving around? When up and moving around  4. Are you currently experiencing any other symptoms? Dizziness   STAT if patient feels like he/she is going to faint   1) Are you dizzy now? No   2) Do you feel faint or have you passed out? No   3) Do you have any other symptoms? Chest pain  4) Have you checked your HR and BP (record if available)? No   Pt c/o of Chest Pain: STAT if CP now or developed within 24 hours  1. Are you having CP right now? Yes, patient states he is currently experiencing a mild chest pain   2. Are you experiencing any other symptoms (ex. SOB, nausea, vomiting, sweating)? No   3. How long have you been experiencing CP? 2-3 days   4. Is your CP continuous or coming and going? Continuous   5. Have you taken Nitroglycerin? No  ?

## 2020-08-04 NOTE — Telephone Encounter (Signed)
Received call directly from operator for patient c/o SOB with minimal exertion x 1 week. He states he does not have a previous hx of SOB. Denies edema, weight gain. Also having midsternal chest heaviness that occurs intermittently. First noticed the chest heaviness on Friday night, having slight discomfort now. Does not have nitroglycerin. Reports hx of back pain, does not feel like this pain is related. He does not monitor BP or HR at home. He had heart cath 2003 but states blockage was too small for stenting. No symptoms like this since that time. States he feels dizzy if he gets up too quickly. States he drinks about 3 bottles of water daily. Does not want to go to the ED - states he feels fine right now. I advised that I will review with the DOD who is covering for Dr. Harrington Challenger and call him back with his advice. He verbalized understanding and agreement.

## 2020-08-04 NOTE — Telephone Encounter (Signed)
Reviewed patient's concerns with Dr. Acie Fredrickson who advised he has an opening and can see the patient tomorrow. Appointment date/time/location verified with patient and he thanked me for the call.

## 2020-08-05 ENCOUNTER — Ambulatory Visit: Payer: Medicare Other | Admitting: Cardiovascular Disease

## 2020-08-05 ENCOUNTER — Encounter: Payer: Self-pay | Admitting: Cardiovascular Disease

## 2020-08-05 ENCOUNTER — Other Ambulatory Visit: Payer: Self-pay

## 2020-08-05 ENCOUNTER — Other Ambulatory Visit (HOSPITAL_COMMUNITY)
Admission: RE | Admit: 2020-08-05 | Discharge: 2020-08-05 | Disposition: A | Discharge status: Home / Self Care | Payer: Medicare Other | Source: Ambulatory Visit | Attending: Cardiology | Admitting: Cardiology

## 2020-08-05 VITALS — BP 114/82 | HR 54 | Ht 78.0 in | Wt 215.0 lb

## 2020-08-05 DIAGNOSIS — E785 Hyperlipidemia, unspecified: Secondary | ICD-10-CM

## 2020-08-05 DIAGNOSIS — Z01812 Encounter for preprocedural laboratory examination: Secondary | ICD-10-CM | POA: Insufficient documentation

## 2020-08-05 DIAGNOSIS — I200 Unstable angina: Secondary | ICD-10-CM

## 2020-08-05 DIAGNOSIS — I2510 Atherosclerotic heart disease of native coronary artery without angina pectoris: Secondary | ICD-10-CM | POA: Diagnosis not present

## 2020-08-05 DIAGNOSIS — Z20822 Contact with and (suspected) exposure to covid-19: Secondary | ICD-10-CM | POA: Insufficient documentation

## 2020-08-05 DIAGNOSIS — Z01810 Encounter for preprocedural cardiovascular examination: Secondary | ICD-10-CM | POA: Diagnosis not present

## 2020-08-05 LAB — CBC WITH DIFFERENTIAL/PLATELET
Basophils Absolute: 0 10*3/uL (ref 0.0–0.2)
Basos: 0 %
EOS (ABSOLUTE): 0.1 10*3/uL (ref 0.0–0.4)
Eos: 1 %
Hematocrit: 45.3 % (ref 37.5–51.0)
Hemoglobin: 15.1 g/dL (ref 13.0–17.7)
Immature Grans (Abs): 0 10*3/uL (ref 0.0–0.1)
Immature Granulocytes: 0 %
Lymphocytes Absolute: 2 10*3/uL (ref 0.7–3.1)
Lymphs: 23 %
MCH: 29.8 pg (ref 26.6–33.0)
MCHC: 33.3 g/dL (ref 31.5–35.7)
MCV: 90 fL (ref 79–97)
Monocytes Absolute: 0.8 10*3/uL (ref 0.1–0.9)
Monocytes: 9 %
Neutrophils Absolute: 6 10*3/uL (ref 1.4–7.0)
Neutrophils: 67 %
Platelets: 232 10*3/uL (ref 150–450)
RBC: 5.06 x10E6/uL (ref 4.14–5.80)
RDW: 14.3 % (ref 11.6–15.4)
WBC: 9 10*3/uL (ref 3.4–10.8)

## 2020-08-05 LAB — D-DIMER, QUANTITATIVE: D-DIMER: <0.2 mg/L FEU (ref 0.00–0.49)

## 2020-08-05 LAB — LIPID PANEL
Chol/HDL Ratio: 2.8 ratio (ref 0.0–5.0)
Cholesterol, Total: 138 mg/dL (ref 100–199)
HDL: 50 mg/dL (ref >39)
LDL Chol Calc (NIH): 71 mg/dL (ref 0–99)
Triglycerides: 87 mg/dL (ref 0–149)
VLDL Cholesterol Cal: 17 mg/dL (ref 5–40)

## 2020-08-05 LAB — TROPONIN I (HIGH SENSITIVITY): Troponin I (High Sensitivity): 6 ng/L (ref <18)

## 2020-08-05 LAB — ALT: ALT: 21 IU/L (ref 0–44)

## 2020-08-05 LAB — SARS CORONAVIRUS 2 (TAT 6-24 HRS): SARS Coronavirus 2: NEGATIVE

## 2020-08-05 MED ORDER — ASPIRIN EC 81 MG PO TBEC: 81.0000 mg | DELAYED_RELEASE_TABLET | Freq: Every day | ORAL | 3 refills | Status: DC

## 2020-08-05 MED ORDER — NITROGLYCERIN 0.4 MG SL SUBL: 0.4000 mg | SUBLINGUAL_TABLET | SUBLINGUAL | 3 refills | Status: AC | PRN

## 2020-08-05 NOTE — Patient Instructions (Addendum)
Medication Instructions:  Your physician recommends that you continue on your current medications as directed. Please refer to the Current Medication list given to you today.  Start Nitro 0.4 mg  Start Aspirin 81 mg daily Hold your Eliquis staring today until after our heart catheterization.   *If you need a refill on your cardiac medications before your next appointment, please call your pharmacy*   Lab Work: d dimer, troponin, cbc, bmet, lipid, ALT  If you have labs (blood work) drawn today and your tests are completely normal, you will receive your results only by: Marland Kitchen MyChart Message (if you have MyChart) OR . A paper copy in the mail If you have any lab test that is abnormal or we need to change your treatment, we will call you to review the results.   Testing/Procedures:    [facility] OFFICE [place], [address] [place] [address] Dept: [phone] Loc: [phone]  Marvin Gill  08/05/2020  You are scheduled for a Cardiac Catheterization on Thursday, March 10 with Dr. Peter [name].  1. Please arrive at the [facility] (Main Entrance A) at [facility]: [address] [place], [place] [address] at 5:30 AM (This time is two hours before your procedure to ensure your preparation). Free valet parking service is available.   Special note: Every effort is made to have your procedure done on time. Please understand that emergencies sometimes delay scheduled procedures.  2. Diet: Do not eat solid foods after midnight.  The patient may have clear liquids until 5am upon the day of the procedure.  3. Labs: You will need to have blood drawn on Tuesday, March 8 at [facility] at [facility]. [address]  Open: 7:30am - 5pm    Phone: [phone]. You do not need to be fasting.  4. Medication instructions in preparation for your procedure:   Contrast  Allergy: No   Current Outpatient Medications (Endocrine & Metabolic):  .  levothyroxine (SYNTHROID, LEVOTHROID) 50 MCG tablet, Take 1 tablet (50 mcg total) by mouth daily before breakfast.  Current Outpatient Medications (Cardiovascular):  .  pravastatin (PRAVACHOL) 40 MG tablet, Take 1 tablet by mouth daily.    Current Outpatient Medications (Hematological):  Marland Kitchen  ELIQUIS 5 MG TABS tablet, TAKE 1 TABLET(5 MG) BY MOUTH TWICE DAILY .  vitamin B-12 (CYANOCOBALAMIN) 1000 MCG tablet, Take 1,000 mcg by mouth daily.  Current Outpatient Medications (Other):  Marland Kitchen  ALPRAZolam (XANAX) 1 MG tablet, Take 1 mg by mouth 2 (two) times daily. Marland Kitchen  doxycycline (VIBRAMYCIN) 100 MG capsule, Take 100 mg by mouth daily. .  DULoxetine (CYMBALTA) 30 MG capsule, Take 30 mg by mouth daily. Marland Kitchen  esomeprazole (NEXIUM) 40 MG capsule, Take 40 mg by mouth daily.  .  famotidine (PEPCID) 20 MG tablet, Take 20 mg by mouth at bedtime as needed. .  fluconazole (DIFLUCAN) 150 MG tablet, Take 2 tablets (300 mg total) by mouth once a week. .  gabapentin (NEURONTIN) 800 MG tablet, Take 800 mg by mouth 3 (three) times daily.  Marland Kitchen  oxybutynin (DITROPAN) 5 MG tablet, Take 1 tablet (5 mg total) by mouth every 8 (eight) hours as needed for bladder spasms. Marland Kitchen  solifenacin (VESICARE) 10 MG tablet, Take 10 mg by mouth daily. .  tamsulosin (FLOMAX) 0.4 MG CAPS capsule, Take 0.4 mg by mouth every evening.  .  valACYclovir (VALTREX) 1000 MG tablet, Take 1,000  mg by mouth 2 (two) times daily as needed. X 7 DAYS, THEN 500 MG BID X 6 MONTHS *For reference purposes while preparing patient instructions.   Delete this med list prior to printing instructions for patient.*  Stop taking Eliquis (Apixiban) on Tuesday, March 8.  On the morning of your procedure, take your Aspirin and any morning medicines NOT listed above.  You may use sips of water.  5. Plan for one night stay--bring personal belongings. 6. Bring a current list of your medications and  current insurance cards. 7. You MUST have a responsible person to drive you home. 8. Someone MUST be with you the first 24 hours after you arrive home or your discharge will be delayed. 9. Please wear clothes that are easy to get on and off and wear slip-on shoes.  Thank you for allowing [place] to care for you!   -- [facility]    Follow-Up: At [facility], you and your health needs are our priority.  As part of our continuing mission to provide you with exceptional heart care, we have created designated Provider Care Teams.  These Care Teams include your primary Cardiologist (physician) and Advanced Practice Providers (APPs -  Physician Assistants and Nurse Practitioners) who all work together to provide you with the care you need, when you need it.  We recommend signing up for the patient portal called "MyChart".  Sign up information is provided on this After Visit Summary.  MyChart is used to connect with patients for Virtual Visits (Telemedicine).  Patients are able to view lab/test results, encounter notes, upcoming appointments, etc.  Non-urgent messages can be sent to your provider as well.   To learn more about what you can do with MyChart, go to [email or web address].      Pre-procedure COVID testing Information:  [address] [place], [place] [address] This is a drive-up visit.   You will need to go home and quarantine between [place] test and procedures on    Directions:   -[place] on [address] from central [place]: Stay on [address] then pass the exit to [address]. It is the next right on [facility] and has a lane to exit into the practice drive-thru parking lot.   -Easy access from [facility] and the [address]. Exit onto [address] going toward [address].

## 2020-08-05 NOTE — Progress Notes (Signed)
Cardiology Office Note:    Date:  08/05/2020   ID:  Marvin Gill, DOB November 06, 1950, MRN [id number]  PCP:  Lavone Orn, [place]  Cardiologist:  Dorris Carnes, MD  Advanced Practice Provider:  No care team member to display Electrophysiologist:  None   :[id number]}   Referring MD: Lavone Orn, MD   Chief Complaint  Patient presents with   Chest Pain   Atrial Fibrillation    August 05, 2020   Marvin Gill is a 70 y.o. male with a hx of atrial fib, HLD., hx of hypothyroidism He is seen today as a work in visit after calling in with chest pain  Chest tightness,  Pressure . Heaviness,  Worse with exertion.  No radiation of the pain  Just shortness of breath  Symptoms can last for hours  Short winded.  Even doing easy exertional activieies ( moving chair to another room )  Works in the [employer] at [employer] at a [employer]   Does his own yard work .  No cough, no fever , No covid symptoms   Has a hx of CAD - had POBA by Dr. Gwenlyn Gill in 2000  - vessel was too small for stenting  The current pain is not similar to the pain he had 20 years ago  Last lipid level that we have on record is from September, 2020.  His total cholesterol is 178.  The HDL is 40.  Triglyceride levels 126.  The LDL is 115.  Quit smoking years ago  Has a hx of atrial fib,  Is on eliquis  Has difficult in taking a deep breath , no hemoptysis  No long airplane trips, car rides, no leg injuries.  Has been under lots of stress   Has occurred daily for the past several days  Will arrange for cath     Past Medical History:  Diagnosis Date   Aneurysm of abdominal aorta ([place])    a. ultasound 08-04-16 --- 3.1 CM X 3.1 CM - sees Dr. Laurann Montana - F/u due 2021.   Anxiety    Benign localized prostatic hyperplasia with lower urinary tract symptoms (LUTS)    Bladder cancer [facility] urologsit-  dr winter   BCG treatment's    COPD with emphysema [facility])    Coronary  artery disease primary cardiologist-- dr Arville Lime   a. s/p PTCA to dLAD 2000. b. Normal coronaries in 2003, patent prior PTCA site.   Dizziness    intermittant   Dysrhythmia 2019   AF-Eliquis started   Dysuria    Feeling light headed    intermittant   Fibromyalgia    GERD (gastroesophageal reflux disease)    History of kidney stones    History of skin cancer    Hypothyroidism    OA (osteoarthritis)    back, shoulders , hips   PAF (paroxysmal atrial fibrillation) ([place]) 03/05/2018   dx in setting sepsis---  cardiologist-- dr Arville Lime (per dr Denman George note in epic pt was to stop eliquis due to hematuria last dose 09-05-2018)   PONV (postoperative nausea and vomiting)    NAUSEA ALL DAY AFTER 02-01-17 SURGERY, PT WANTS ANESTHESIA LIKE 06-23-15 SURGERY WITH DR  [name]   S/P percutaneous transluminal coronary angioplasty    05-18-1999  to dLAD   Wears glasses     Past Surgical History:  Procedure Laterality Date   CARDIAC CATHETERIZATION  06-14-2001  dr Einar Gip   normal  coronary arteries and patent previous dLAD    CARDIOVASCULAR STRESS TEST  06/12/2008   Low risk nuclear study w/ no evidence ischemia/  normal LV funciton and wall motion , ef 60%   COLONOSCOPY WITH PROPOFOL N/A 12/13/2013   Procedure: COLONOSCOPY WITH PROPOFOL;  Surgeon: Garlan Fair, MD;  Location: [facility];  Service: Endoscopy;  Laterality: N/A;   CORONARY ANGIOPLASTY  05-18-1999  dr Tami Ribas   PTCA balloon to dLAD 80% (single vessel disease),  ef 60%   CYSTOSCOPY WITH FULGERATION N/A 08/15/2017   Procedure: CYSTOSCOP/ BLADDER BIOPSY/TURBT;  Surgeon: Nickie Retort, MD;  Location: [facility];  Service: Urology;  Laterality: N/A;   CYSTOSCOPY WITH INSERTION OF UROLIFT N/A 06/23/2015   Procedure: CYSTOSCOPY WITH INSERTION OF UROLIFT;  Surgeon: Carolan Clines, MD;  Location: [facility];  Service: Urology;  Laterality: N/A;   CYSTOSCOPY WITH STENT PLACEMENT Left 04/21/2018    Procedure: CYSTOSCOPY WITH STENT PLACEMENT;  Surgeon: Bjorn Loser, MD;  Location: [facility];  Service: Urology;  Laterality: Left;  Emergent case   CYSTOSCOPY WITH URETEROSCOPY AND STENT PLACEMENT Left 04/07/2018   Procedure: CYSTOSCOPY WITH URETEROSCOPY AND STENT EXCHANGE/ BLADDER BIOPSY/LASER ABLATION OF URETERAL TUMOR;  Surgeon: Ceasar Mons, MD;  Location: [facility];  Service: Urology;  Laterality: Left;  ONLY NEEDS 60 MIN   CYSTOSCOPY WITH URETEROSCOPY AND STENT PLACEMENT Left 11/28/2018   Procedure: CYSTOSCOPY WITH URETEROSCOPY , LEFT URETERAL AND BLADDER BIOPSY, LEFT STENT PLACEMENT;  Surgeon: Ceasar Mons, MD;  Location: [facility];  Service: Urology;  Laterality: Left;   CYSTOSCOPY/URETEROSCOPY/HOLMIUM LASER/STENT PLACEMENT Bilateral 02/01/2017   Procedure: CYSTOSCOPY/URETEROSCOPY/HOLMIUM LASER/STENT PLACEMENT,STONE BASKETRY, BLADDER BIOPSY;  Surgeon: Nickie Retort, MD;  Location: [facility];  Service: Urology;  Laterality: Bilateral;   ESOPHAGOGASTRODUODENOSCOPY  08-24-2006   TRANSTHORACIC ECHOCARDIOGRAM  03/06/2018   mild LVH,  ef 55-60%,  pt in atrial fib. LV diastolic not evaluated/  mild LAE and RAE/  moderate AV calcification with sclerosis , no stenosis or regurg./  mild dilated aortic root, 19mm/  trival MR and TR   TRANSURETHRAL INCISION OF PROSTATE N/A 04/01/2014   Procedure: TRANSURETHRAL INCISION OF THE PROSTATE (TUIP);  Surgeon: Ailene Rud, MD;  Location[facility];  Service: Urology;  Laterality: N/A;    Current Medications: Current Meds  Medication Sig   ALPRAZolam (XANAX) 1 MG tablet Take 1 mg by mouth 2 (two) times daily.   aspirin EC 81 MG tablet Take 1 tablet (81 mg total) by mouth daily. Swallow whole.   doxycycline (VIBRAMYCIN) 100 MG capsule Take 100 mg by mouth daily.   DULoxetine (CYMBALTA) 30 MG capsule Take 30 mg by mouth daily.   ELIQUIS 5 MG  TABS tablet TAKE 1 TABLET(5 MG) BY MOUTH TWICE DAILY   esomeprazole (NEXIUM) 40 MG capsule Take 40 mg by mouth daily.    famotidine (PEPCID) 20 MG tablet Take 20 mg by mouth at bedtime as needed.   fluconazole (DIFLUCAN) 150 MG tablet Take 2 tablets (300 mg total) by mouth once a week.   gabapentin (NEURONTIN) 800 MG tablet Take 800 mg by mouth 3 (three) times daily.    levothyroxine (SYNTHROID, LEVOTHROID) 50 MCG tablet Take 1 tablet (50 mcg total) by mouth daily before breakfast.   nitroGLYCERIN (NITROSTAT) 0.4 MG SL tablet Place 1 tablet (0.4 mg total) under the tongue every 5 (five) minutes as needed for chest pain.   oxybutynin (DITROPAN) 5 MG tablet Take  1 tablet (5 mg total) by mouth every 8 (eight) hours as needed for bladder spasms.   pravastatin (PRAVACHOL) 40 MG tablet Take 1 tablet by mouth daily.   solifenacin (VESICARE) 10 MG tablet Take 10 mg by mouth daily.   tamsulosin (FLOMAX) 0.4 MG CAPS capsule Take 0.4 mg by mouth every evening.    valACYclovir (VALTREX) 1000 MG tablet Take 1,000 mg by mouth 2 (two) times daily as needed. X 7 DAYS, THEN 500 MG BID X 6 MONTHS   vitamin B-12 (CYANOCOBALAMIN) 1000 MCG tablet Take 1,000 mcg by mouth daily.   [DISCONTINUED] rosuvastatin (CRESTOR) 10 MG tablet TAKE 1 TABLET(10 MG) BY MOUTH DAILY     Allergies:   Codeine   Social History   Socioeconomic History   Marital status: Divorced    Spouse name: Not on file   Number of children: Not on file   Years of education: Not on file   Highest education level: Not on file  Occupational History   Not on file  Tobacco Use   Smoking status: Former Smoker    Packs/day: 1.00    Years: 25.00    Pack years: 25.00    Types: Cigarettes    Quit date: 06/01/2003    Years since quitting: 17.1   Smokeless tobacco: Never Used  Scientific laboratory technician Use: Never used  Substance and Sexual Activity   Alcohol use: No   Drug use: No   Sexual activity: Not on file  Other Topics  Concern   Not on file  Social History Narrative   Not on file   Social Determinants of Health   Financial Resource Strain: Not on file  Food Insecurity: Not on file  Transportation Needs: Not on file  Physical Activity: Not on file  Stress: Not on file  Social Connections: Not on file     Family History: The patient's family history includes Anuerysm in his mother.  ROS:   Please see the history of present illness.     All other systems reviewed and are negative.  EKGs/Labs/Other Studies Reviewed:    The following studies were reviewed today:   EKG: August 05, 2020: Sinus bradycardia 54.  Nonspecific ST and T wave abnormalities.  Recent Labs: 07/08/2020: Hemoglobin 13.7; Platelets 245  Recent Lipid Panel    Component Value Date/Time   CHOL 178 02/12/2019 0856   TRIG 126 02/12/2019 0856   HDL 40 02/12/2019 0856   CHOLHDL 4.5 02/12/2019 0856   CHOLHDL 4.5 05/10/2007 0305   VLDL 17 05/10/2007 0305   LDLCALC 115 (H) 02/12/2019 0856     Risk Assessment/Calculations:       Physical Exam:    VS:  BP 114/82    Pulse (!) 54    Ht 6\' 6"  (1.981 m)    Wt 215 lb (97.5 kg)    SpO2 97%    BMI 24.85 kg/m     Wt Readings from Last 3 Encounters:  08/05/20 215 lb (97.5 kg)  02/14/20 221 lb 9.6 oz (100.5 kg)  01/02/20 226 lb (102.5 kg)     GEN:  Well nourished, well developed in no acute distress HEENT: Normal NECK: No JVD; No carotid bruits LYMPHATICS: No lymphadenopathy CARDIAC: RRR, no murmurs, rubs, gallops RESPIRATORY:  Clear to auscultation without rales, wheezing or rhonchi  ABDOMEN: Soft, non-tender, non-distended MUSCULOSKELETAL:  No edema; No deformity  SKIN: Warm and dry NEUROLOGIC:  Alert and oriented x 3 PSYCHIATRIC:  Normal affect   ASSESSMENT:  1. Hyperlipidemia LDL goal <70   2. Coronary artery disease involving native coronary artery of native heart without angina pectoris   3. Unstable angina ([place])   4. Pre-procedural cardiovascular  examination    PLAN:    In order of problems listed above:  1. Unstable angina: [place] presents presents with several days of increasing shortness of breath and chest tightness/heaviness.  These episodes tend to last for several minutes but may be as long as several hours.  He is normally very active but now has the symptoms doing as little as carrying a chair into another room.  His main complaint is worsening shortness of breath with exertion.  We will arrange for him to have a heart catheterization in the next 2 to 3 days.  We will need to hold Eliquis at this time.  He will start aspirin 81 mg a day during the time that he is off Eliquis.  Will check a troponin level.  We have discussed the risks, benefits, options concerning heart catheterization.  He understands and agrees to proceed.   I would also like to check a D-dimer level.  It is unlikely that he would have a DVT/PE on Eliquis bu I think it would be worthwhile to check a D-dimer.  2.  Hyperlipidemia: He is currently on rosuvastatin.  He is not had lipids checked in quite some time.  We will check lipid panel today.  3.  Atrial fibrillation: He has paroxysmal atrial fibrillation.  He is on Eliquis.  Will need to hold Eliquis for his heart catheterization.  He knows that he will need to restart the Eliquis once he is done with his heart cath.  We will give him further instructions following the cath.    Shared Decision Making/Informed Consent{      Medication Adjustments/Labs and Tests Ordered: Current medicines are reviewed at length with the patient today.  Concerns regarding medicines are outlined above.  Orders Placed This Encounter  Procedures   D-Dimer, Quantitative   CBC w/Diff   Basic metabolic panel   Lipid Profile   ALT   EKG 12-Lead   Meds ordered this encounter  Medications   aspirin EC 81 MG tablet    Sig: Take 1 tablet (81 mg total) by mouth daily. Swallow whole.    Dispense:  90 tablet     Refill:  3   nitroGLYCERIN (NITROSTAT) 0.4 MG SL tablet    Sig: Place 1 tablet (0.4 mg total) under the tongue every 5 (five) minutes as needed for chest pain.    Dispense:  25 tablet    Refill:  3    Patient Instructions  Medication Instructions:  Your physician recommends that you continue on your current medications as directed. Please refer to the Current Medication list given to you today.  Start Nitro 0.4 mg  Start Aspirin 81 mg daily Hold your Eliquis staring today until after our heart catheterization.   *If you need a refill on your cardiac medications before your next appointment, please call your pharmacy*   Lab Work: d dimer, troponin, cbc, bmet, lipid, ALT  If you have labs (blood work) drawn today and your tests are completely normal, you will receive your results only by:  [place] (if you have MyChart) OR  A paper copy in the mail If you have any lab test that is abnormal or we need to change your treatment, we will call you to review the results.   Testing/Procedures:  [facility] [place], [place] [address] Dept: [phone] Loc: [place]  08/05/2020  You are scheduled for a Cardiac Catheterization on Thursday, March 10 with Dr. Peter [name].  1. Please arrive at the [facility] (Main Entrance A) at [facility]: [address] [place], [place] [address] at 5:30 AM (This time is two hours before your procedure to ensure your preparation). Free valet parking service is available.   Special note: Every effort is made to have your procedure done on time. Please understand that emergencies sometimes delay scheduled procedures.  2. Diet: Do not eat solid foods after midnight.  The patient may have clear liquids until 5am upon the day of the procedure.  3. Labs: You will need to have blood drawn on Tuesday, March 8  at [facility] [address]  Open: 7:30am - 5pm    Phone: [phone]. You do not need to be fasting.  4. Medication instructions in preparation for your procedure:   Contrast Allergy: No   Current Outpatient Medications (Endocrine & Metabolic):    levothyroxine (SYNTHROID, LEVOTHROID) 50 MCG tablet, Take 1 tablet (50 mcg total) by mouth daily before breakfast.  Current Outpatient Medications (Cardiovascular):    pravastatin (PRAVACHOL) 40 MG tablet, Take 1 tablet by mouth daily.    Current Outpatient Medications (Hematological):    ELIQUIS 5 MG TABS tablet, TAKE 1 TABLET(5 MG) BY MOUTH TWICE DAILY   vitamin B-12 (CYANOCOBALAMIN) 1000 MCG tablet, Take 1,000 mcg by mouth daily.  Current Outpatient Medications (Other):    ALPRAZolam (XANAX) 1 MG tablet, Take 1 mg by mouth 2 (two) times daily.   doxycycline (VIBRAMYCIN) 100 MG capsule, Take 100 mg by mouth daily.   DULoxetine (CYMBALTA) 30 MG capsule, Take 30 mg by mouth daily.   esomeprazole (NEXIUM) 40 MG capsule, Take 40 mg by mouth daily.    famotidine (PEPCID) 20 MG tablet, Take 20 mg by mouth at bedtime as needed.   fluconazole (DIFLUCAN) 150 MG tablet, Take 2 tablets (300 mg total) by mouth once a week.   gabapentin (NEURONTIN) 800 MG tablet, Take 800 mg by mouth 3 (three) times daily.    oxybutynin (DITROPAN) 5 MG tablet, Take 1 tablet (5 mg total) by mouth every 8 (eight) hours as needed for bladder spasms.   solifenacin (VESICARE) 10 MG tablet, Take 10 mg by mouth daily.   tamsulosin (FLOMAX) 0.4 MG CAPS capsule, Take 0.4 mg by mouth every evening.    valACYclovir (VALTREX) 1000 MG tablet, Take 1,000 mg by mouth 2 (two) times daily as needed. X 7 DAYS, THEN 500 MG BID X 6 MONTHS *For reference purposes while preparing patient instructions.   Delete this med list prior to printing instructions for patient.*  Stop taking Eliquis (Apixiban) on Tuesday, March 8.  On  the morning of your procedure, take your Aspirin and any morning medicines NOT listed above.  You may use sips of water.  5. Plan for one night stay--bring personal belongings. 6. Bring a current list of your medications and current insurance cards. 7. You MUST have a responsible person to drive you home. 8. Someone MUST be with you the first 24 hours after you arrive home or your discharge will be delayed. 9. Please wear clothes that are easy to get on and off and wear slip-on shoes.  Thank you for  allowing [place] to care for you!   -- [facility]    Follow-Up: At [facility], you and your health needs are our priority.  As part of our continuing mission to provide you with exceptional heart care, we have created designated Provider Care Teams.  These Care Teams include your primary Cardiologist (physician) and Advanced Practice Providers (APPs -  Physician Assistants and Nurse Practitioners) who all work together to provide you with the care you need, when you need it.  We recommend signing up for the patient portal called "MyChart".  Sign up information is provided on this After Visit Summary.  MyChart is used to connect with patients for Virtual Visits (Telemedicine).  Patients are able to view lab/test results, encounter notes, upcoming appointments, etc.  Non-urgent messages can be sent to your provider as well.   To learn more about what you can do with MyChart, go to [email or web address].      Pre-procedure COVID testing Information:  [address] [place], [place] [address] This is a drive-up visit.   You will need to go home and quarantine between [place] test and procedures on    Directions:   -[place] on [address] from central [place]: Stay on [address] then pass the exit to [address]. It is the next right on [address] [facility] and has a lane to exit into the practice drive-thru parking lot.   -Easy access  from [facility] and the [address]. Exit onto [address] going toward [address].          Signed, Mertie Moores, MD  08/05/2020 1:48 PM    [facility]

## 2020-08-05 NOTE — H&P (View-Only) (Signed)
Cardiology Office Note:    Date:  08/05/2020   ID:  Marvin Gill, DOB [birth year], MRN [id number]  PCP:  Lavone Orn, [place]  Cardiologist:  Dorris Carnes, MD  Advanced Practice Provider:  No care team member to display Electrophysiologist:  None   :[id number]}   Referring MD: Lavone Orn, MD   Chief Complaint  Patient presents with  . Chest Pain  . Atrial Fibrillation    August 05, 2020   Marvin Gill is a 70 y.o. male with a hx of atrial fib, HLD., hx of hypothyroidism He is seen today as a work in visit after calling in with chest pain  Chest tightness,  Pressure . Heaviness,  Worse with exertion.  No radiation of the pain  Just shortness of breath  Symptoms can last for hours  Short winded.  Even doing easy exertional activieies ( moving chair to another room )  Works in the [employer] at [employer] at a [employer]   Does his own yard work .  No cough, no fever , No covid symptoms   Has a hx of CAD - had POBA by Dr. Gwenlyn Found in 2000  - vessel was too small for stenting  The current pain is not similar to the pain he had 20 years ago  Last lipid level that we have on record is from September, 2020.  His total cholesterol is 178.  The HDL is 40.  Triglyceride levels 126.  The LDL is 115.  Quit smoking years ago  Has a hx of atrial fib,  Is on eliquis  Has difficult in taking a deep breath , no hemoptysis  No long airplane trips, car rides, no leg injuries.  Has been under lots of stress   Has occurred daily for the past several days  Will arrange for cath     Past Medical History:  Diagnosis Date  . Aneurysm of abdominal aorta (HCC)    a. ultasound 08-04-16 --- 3.1 CM X 3.1 CM - sees Dr. Laurann Montana - F/u due 2021.  Marland Kitchen Anxiety   . Benign localized prostatic hyperplasia with lower urinary tract symptoms (LUTS)   . Bladder cancer [facility]) urologsit-  dr winter   BCG treatment's   . COPD with emphysema ([place])   . Coronary  artery disease primary cardiologist-- dr Arville Lime   a. s/p PTCA to dLAD 2000. b. Normal coronaries in 2003, patent prior PTCA site.  . Dizziness    intermittant  . Dysrhythmia 2019   AF-Eliquis started  . Dysuria   . Feeling light headed    intermittant  . Fibromyalgia   . GERD (gastroesophageal reflux disease)   . History of kidney stones   . History of skin cancer   . Hypothyroidism   . OA (osteoarthritis)    back, shoulders , hips  . PAF (paroxysmal atrial fibrillation) ([place]) 03/05/2018   dx in setting sepsis---  cardiologist-- dr Arville Lime (per dr Denman George note in epic pt was to stop eliquis due to hematuria last dose 09-05-2018)  . PONV (postoperative nausea and vomiting)    NAUSEA ALL DAY AFTER 02-01-17 SURGERY, PT WANTS ANESTHESIA LIKE 06-23-15 SURGERY WITH DR  [name]  . S/P percutaneous transluminal coronary angioplasty    05-18-1999  to dLAD  . Wears glasses     Past Surgical History:  Procedure Laterality Date  . CARDIAC CATHETERIZATION  06-14-2001  dr Einar Gip   normal  coronary arteries and patent previous dLAD   . CARDIOVASCULAR STRESS TEST  06/12/2008   Low risk nuclear study w/ no evidence ischemia/  normal LV funciton and wall motion , ef 60%  . COLONOSCOPY WITH PROPOFOL N/A 12/13/2013   Procedure: COLONOSCOPY WITH PROPOFOL;  Surgeon: Garlan Fair, MD;  Location: [facility];  Service: Endoscopy;  Laterality: N/A;  . CORONARY ANGIOPLASTY  05-18-1999  dr Tami Ribas   PTCA balloon to dLAD 80% (single vessel disease),  ef 60%  . CYSTOSCOPY WITH FULGERATION N/A 08/15/2017   Procedure: CYSTOSCOP/ BLADDER BIOPSY/TURBT;  Surgeon: Nickie Retort, MD;  Location: [facility];  Service: Urology;  Laterality: N/A;  . CYSTOSCOPY WITH INSERTION OF UROLIFT N/A 06/23/2015   Procedure: CYSTOSCOPY WITH INSERTION OF UROLIFT;  Surgeon: Carolan Clines, MD;  Location: [facility];  Service: Urology;  Laterality: N/A;  . CYSTOSCOPY WITH STENT PLACEMENT Left 04/21/2018    Procedure: CYSTOSCOPY WITH STENT PLACEMENT;  Surgeon: Bjorn Loser, MD;  Location: [place];  Service: Urology;  Laterality: Left;  Emergent case  . CYSTOSCOPY WITH URETEROSCOPY AND STENT PLACEMENT Left 04/07/2018   Procedure: CYSTOSCOPY WITH URETEROSCOPY AND STENT EXCHANGE/ BLADDER BIOPSY/LASER ABLATION OF URETERAL TUMOR;  Surgeon: Ceasar Mons, MD;  Location: [facility];  Service: Urology;  Laterality: Left;  ONLY NEEDS 60 MIN  . CYSTOSCOPY WITH URETEROSCOPY AND STENT PLACEMENT Left 11/28/2018   Procedure: CYSTOSCOPY WITH URETEROSCOPY , LEFT URETERAL AND BLADDER BIOPSY, LEFT STENT PLACEMENT;  Surgeon: Ceasar Mons, MD;  Location: [facility];  Service: Urology;  Laterality: Left;  . CYSTOSCOPY/URETEROSCOPY/HOLMIUM LASER/STENT PLACEMENT Bilateral 02/01/2017   Procedure: CYSTOSCOPY/URETEROSCOPY/HOLMIUM LASER/STENT PLACEMENT,STONE BASKETRY, BLADDER BIOPSY;  Surgeon: Nickie Retort, MD;  [facility];  Service: Urology;  Laterality: Bilateral;  . ESOPHAGOGASTRODUODENOSCOPY  08-24-2006  . TRANSTHORACIC ECHOCARDIOGRAM  03/06/2018   mild LVH,  ef 55-60%,  pt in atrial fib. LV diastolic not evaluated/  mild LAE and RAE/  moderate AV calcification with sclerosis , no stenosis or regurg./  mild dilated aortic root, 26mm/  trival MR and TR  . TRANSURETHRAL INCISION OF PROSTATE N/A 04/01/2014   Procedure: TRANSURETHRAL INCISION OF THE PROSTATE (TUIP);  Surgeon: Ailene Rud, MD;  Location: [facility];  Service: Urology;  Laterality: N/A;    Current Medications: Current Meds  Medication Sig  . ALPRAZolam (XANAX) 1 MG tablet Take 1 mg by mouth 2 (two) times daily.  Marland Kitchen aspirin EC 81 MG tablet Take 1 tablet (81 mg total) by mouth daily. Swallow whole.  Marland Kitchen doxycycline (VIBRAMYCIN) 100 MG capsule Take 100 mg by mouth daily.  . DULoxetine (CYMBALTA) 30 MG capsule Take 30 mg by mouth daily.  Marland Kitchen ELIQUIS 5 MG  TABS tablet TAKE 1 TABLET(5 MG) BY MOUTH TWICE DAILY  . esomeprazole (NEXIUM) 40 MG capsule Take 40 mg by mouth daily.   . famotidine (PEPCID) 20 MG tablet Take 20 mg by mouth at bedtime as needed.  . fluconazole (DIFLUCAN) 150 MG tablet Take 2 tablets (300 mg total) by mouth once a week.  . gabapentin (NEURONTIN) 800 MG tablet Take 800 mg by mouth 3 (three) times daily.   Marland Kitchen levothyroxine (SYNTHROID, LEVOTHROID) 50 MCG tablet Take 1 tablet (50 mcg total) by mouth daily before breakfast.  . nitroGLYCERIN (NITROSTAT) 0.4 MG SL tablet Place 1 tablet (0.4 mg total) under the tongue every 5 (five) minutes as needed for chest pain.  Marland Kitchen oxybutynin (DITROPAN) 5 MG tablet Take  1 tablet (5 mg total) by mouth every 8 (eight) hours as needed for bladder spasms.  . pravastatin (PRAVACHOL) 40 MG tablet Take 1 tablet by mouth daily.  . solifenacin (VESICARE) 10 MG tablet Take 10 mg by mouth daily.  . tamsulosin (FLOMAX) 0.4 MG CAPS capsule Take 0.4 mg by mouth every evening.   . valACYclovir (VALTREX) 1000 MG tablet Take 1,000 mg by mouth 2 (two) times daily as needed. X 7 DAYS, THEN 500 MG BID X 6 MONTHS  . vitamin B-12 (CYANOCOBALAMIN) 1000 MCG tablet Take 1,000 mcg by mouth daily.  . [DISCONTINUED] rosuvastatin (CRESTOR) 10 MG tablet TAKE 1 TABLET(10 MG) BY MOUTH DAILY     Allergies:   Codeine   Social History   Socioeconomic History  . Marital status: Divorced    Spouse name: Not on file  . Number of children: Not on file  . Years of education: Not on file  . Highest education level: Not on file  Occupational History  . Not on file  Tobacco Use  . Smoking status: Former Smoker    Packs/day: 1.00    Years: 25.00    Pack years: 25.00    Types: Cigarettes    Quit date: 06/01/2003    Years since quitting: 17.1  . Smokeless tobacco: Never Used  Vaping Use  . Vaping Use: Never used  Substance and Sexual Activity  . Alcohol use: No  . Drug use: No  . Sexual activity: Not on file  Other Topics  Concern  . Not on file  Social History Narrative  . Not on file   Social Determinants of Health   Financial Resource Strain: Not on file  Food Insecurity: Not on file  Transportation Needs: Not on file  Physical Activity: Not on file  Stress: Not on file  Social Connections: Not on file     Family History: The patient's family history includes Anuerysm in his mother.  ROS:   Please see the history of present illness.     All other systems reviewed and are negative.  EKGs/Labs/Other Studies Reviewed:    The following studies were reviewed today:   EKG: August 05, 2020: Sinus bradycardia 54.  Nonspecific ST and T wave abnormalities.  Recent Labs: 07/08/2020: Hemoglobin 13.7; Platelets 245  Recent Lipid Panel    Component Value Date/Time   CHOL 178 02/12/2019 0856   TRIG 126 02/12/2019 0856   HDL 40 02/12/2019 0856   CHOLHDL 4.5 02/12/2019 0856   CHOLHDL 4.5 05/10/2007 0305   VLDL 17 05/10/2007 0305   LDLCALC 115 (H) 02/12/2019 0856     Risk Assessment/Calculations:       Physical Exam:    VS:  BP 114/82   Pulse (!) 54   Ht 6\' 6"  (1.981 m)   Wt 215 lb (97.5 kg)   SpO2 97%   BMI 24.85 kg/m     Wt Readings from Last 3 Encounters:  08/05/20 215 lb (97.5 kg)  02/14/20 221 lb 9.6 oz (100.5 kg)  01/02/20 226 lb (102.5 kg)     GEN:  Well nourished, well developed in no acute distress HEENT: Normal NECK: No JVD; No carotid bruits LYMPHATICS: No lymphadenopathy CARDIAC: RRR, no murmurs, rubs, gallops RESPIRATORY:  Clear to auscultation without rales, wheezing or rhonchi  ABDOMEN: Soft, non-tender, non-distended MUSCULOSKELETAL:  No edema; No deformity  SKIN: Warm and dry NEUROLOGIC:  Alert and oriented x 3 PSYCHIATRIC:  Normal affect   ASSESSMENT:    1. Hyperlipidemia  LDL goal <70   2. Coronary artery disease involving native coronary artery of native heart without angina pectoris   3. Unstable angina ([place])   4. Pre-procedural cardiovascular  examination    PLAN:    In order of problems listed above:  1. Unstable angina: Trellis presents presents with several days of increasing shortness of breath and chest tightness/heaviness.  These episodes tend to last for several minutes but may be as long as several hours.  He is normally very active but now has the symptoms doing as little as carrying a chair into another room.  His main complaint is worsening shortness of breath with exertion.  We will arrange for him to have a heart catheterization in the next 2 to 3 days.  We will need to hold Eliquis at this time.  He will start aspirin 81 mg a day during the time that he is off Eliquis.  Will check a troponin level.  We have discussed the risks, benefits, options concerning heart catheterization.  He understands and agrees to proceed.   I would also like to check a D-dimer level.  It is unlikely that he would have a DVT/PE on Eliquis bu I think it would be worthwhile to check a D-dimer.  2.  Hyperlipidemia: He is currently on rosuvastatin.  He is not had lipids checked in quite some time.  We will check lipid panel today.  3.  Atrial fibrillation: He has paroxysmal atrial fibrillation.  He is on Eliquis.  Will need to hold Eliquis for his heart catheterization.  He knows that he will need to restart the Eliquis once he is done with his heart cath.  We will give him further instructions following the cath.    Shared Decision Making/Informed Consent{      Medication Adjustments/Labs and Tests Ordered: Current medicines are reviewed at length with the patient today.  Concerns regarding medicines are outlined above.  Orders Placed This Encounter  Procedures  . D-Dimer, Quantitative  . CBC w/Diff  . Basic metabolic panel  . Lipid Profile  . ALT  . EKG 12-Lead   Meds ordered this encounter  Medications  . aspirin EC 81 MG tablet    Sig: Take 1 tablet (81 mg total) by mouth daily. Swallow whole.    Dispense:  90 tablet     Refill:  3  . nitroGLYCERIN (NITROSTAT) 0.4 MG SL tablet    Sig: Place 1 tablet (0.4 mg total) under the tongue every 5 (five) minutes as needed for chest pain.    Dispense:  25 tablet    Refill:  3    Patient Instructions  Medication Instructions:  Your physician recommends that you continue on your current medications as directed. Please refer to the Current Medication list given to you today.  Start Nitro 0.4 mg  Start Aspirin 81 mg daily Hold your Eliquis staring today until after our heart catheterization.   *If you need a refill on your cardiac medications before your next appointment, please call your pharmacy*   Lab Work: d dimer, troponin, cbc, bmet, lipid, ALT  If you have labs (blood work) drawn today and your tests are completely normal, you will receive your results only by: Marland Kitchen MyChart Message (if you have MyChart) OR . A paper copy in the mail If you have any lab test that is abnormal or we need to change your treatment, we will call you to review the results.   Testing/Procedures:    [facility]  [place] OFFICE [place], [address] [place] [address] Dept: [phone] Loc: [place]  08/05/2020  You are scheduled for a Cardiac Catheterization on Thursday, March 10 with Dr. Peter [name].  1. Please arrive at the [facility] (Main Entrance A) at [facility]: [address] [place], [place] [address] at 5:30 AM (This time is two hours before your procedure to ensure your preparation). Free valet parking service is available.   Special note: Every effort is made to have your procedure done on time. Please understand that emergencies sometimes delay scheduled procedures.  2. Diet: Do not eat solid foods after midnight.  The patient may have clear liquids until 5am upon the day of the procedure.  3. Labs: You will need to have blood drawn on Tuesday, March 8  at [facility]. [address]  Open: 7:30am - 5pm    Phone: [phone]. You do not need to be fasting.  4. Medication instructions in preparation for your procedure:   Contrast Allergy: No   Current Outpatient Medications (Endocrine & Metabolic):  .  levothyroxine (SYNTHROID, LEVOTHROID) 50 MCG tablet, Take 1 tablet (50 mcg total) by mouth daily before breakfast.  Current Outpatient Medications (Cardiovascular):  .  pravastatin (PRAVACHOL) 40 MG tablet, Take 1 tablet by mouth daily.    Current Outpatient Medications (Hematological):  Marland Kitchen  ELIQUIS 5 MG TABS tablet, TAKE 1 TABLET(5 MG) BY MOUTH TWICE DAILY .  vitamin B-12 (CYANOCOBALAMIN) 1000 MCG tablet, Take 1,000 mcg by mouth daily.  Current Outpatient Medications (Other):  Marland Kitchen  ALPRAZolam (XANAX) 1 MG tablet, Take 1 mg by mouth 2 (two) times daily. Marland Kitchen  doxycycline (VIBRAMYCIN) 100 MG capsule, Take 100 mg by mouth daily. .  DULoxetine (CYMBALTA) 30 MG capsule, Take 30 mg by mouth daily. Marland Kitchen  esomeprazole (NEXIUM) 40 MG capsule, Take 40 mg by mouth daily.  .  famotidine (PEPCID) 20 MG tablet, Take 20 mg by mouth at bedtime as needed. .  fluconazole (DIFLUCAN) 150 MG tablet, Take 2 tablets (300 mg total) by mouth once a week. .  gabapentin (NEURONTIN) 800 MG tablet, Take 800 mg by mouth 3 (three) times daily.  Marland Kitchen  oxybutynin (DITROPAN) 5 MG tablet, Take 1 tablet (5 mg total) by mouth every 8 (eight) hours as needed for bladder spasms. Marland Kitchen  solifenacin (VESICARE) 10 MG tablet, Take 10 mg by mouth daily. .  tamsulosin (FLOMAX) 0.4 MG CAPS capsule, Take 0.4 mg by mouth every evening.  .  valACYclovir (VALTREX) 1000 MG tablet, Take 1,000 mg by mouth 2 (two) times daily as needed. X 7 DAYS, THEN 500 MG BID X 6 MONTHS *For reference purposes while preparing patient instructions.   Delete this med list prior to printing instructions for patient.*  Stop taking Eliquis (Apixiban) on Tuesday, March 8.  On  the morning of your procedure, take your Aspirin and any morning medicines NOT listed above.  You may use sips of water.  5. Plan for one night stay--bring personal belongings. 6. Bring a current list of your medications and current insurance cards. 7. You MUST have a responsible person to drive you home. 8. Someone MUST be with you the first 24 hours after you arrive home or your discharge will be delayed. 9. Please wear clothes that are easy to get on and off and wear slip-on shoes.  Thank you for allowing [place]  to care for you!   -- [place] [facility]    Follow-Up: At [facility], you and your health needs are our priority.  As part of our continuing mission to provide you with exceptional heart care, we have created designated Provider Care Teams.  These Care Teams include your primary Cardiologist (physician) and Advanced Practice Providers (APPs -  Physician Assistants and Nurse Practitioners) who all work together to provide you with the care you need, when you need it.  We recommend signing up for the patient portal called "MyChart".  Sign up information is provided on this After Visit Summary.  MyChart is used to connect with patients for Virtual Visits (Telemedicine).  Patients are able to view lab/test results, encounter notes, upcoming appointments, etc.  Non-urgent messages can be sent to your provider as well.   To learn more about what you can do with MyChart, go to [email or web address].      Pre-procedure COVID testing Information:  [address] [place], [place] [address] This is a drive-up visit.   You will need to go home and quarantine between [place] test and procedures on    Directions:   -[place] on [address] from central [place]: Stay on [address] then pass the exit to [address]. It is the next right on [facility] and has a lane to exit into the practice drive-thru parking lot.   -Easy access  from [facility] and the [address]. Exit onto [address] going toward [address].          Signed, Mertie Moores, MD  08/05/2020 1:48 PM    [facility]

## 2020-08-06 ENCOUNTER — Telehealth: Payer: Self-pay | Admitting: *Deleted

## 2020-08-06 NOTE — Telephone Encounter (Addendum)
Pt contacted pre-catheterization scheduled at [facility] for: Thursday August 07, 2020 7:30 AM Verified arrival time and place: [facility]) at:5:30 AM   No solid food after midnight prior to cath, clear liquids until 5 AM day of procedure.  Hold: Eliquis-none PM 08/05/20 until post procedure  Except hold medications AM meds can be  taken pre-cath with sips of water including: ASA 81 mg   Confirmed patient has responsible adult to drive home post procedure and be with patient first 24 hours after arriving home: yes  You are allowed ONE visitor in the waiting room during the time you are at the hospital for your procedure. Both you and your visitor must wear a mask once you enter the hospital.   Reviewed procedure/mask/visitor instructions with patient.  BMP was not done 08/05/20, call placed to [place], will add BMP-will place order for same day result, will notify office within 24 hours if BMP cannot be done. Pt is aware that if BMP is not resulted by in the morning prior to procedure, it will need to be done on arrival to hospital.

## 2020-08-07 ENCOUNTER — Other Ambulatory Visit: Payer: Self-pay

## 2020-08-07 ENCOUNTER — Encounter (HOSPITAL_COMMUNITY)
Admission: RE | Disposition: A | Discharge status: Home / Self Care | Payer: Self-pay | Source: Home / Self Care | Attending: Cardiology

## 2020-08-07 ENCOUNTER — Encounter (HOSPITAL_COMMUNITY): Payer: Self-pay | Admitting: Cardiology

## 2020-08-07 ENCOUNTER — Ambulatory Visit (HOSPITAL_COMMUNITY)
Admission: RE | Admit: 2020-08-07 | Discharge: 2020-08-07 | Disposition: A | Discharge status: Home / Self Care | Payer: Medicare Other | Attending: Cardiology | Admitting: Cardiology

## 2020-08-07 DIAGNOSIS — Z7901 Long term (current) use of anticoagulants: Secondary | ICD-10-CM | POA: Diagnosis not present

## 2020-08-07 DIAGNOSIS — Z79899 Other long term (current) drug therapy: Secondary | ICD-10-CM | POA: Diagnosis not present

## 2020-08-07 DIAGNOSIS — I200 Unstable angina: Secondary | ICD-10-CM | POA: Diagnosis present

## 2020-08-07 DIAGNOSIS — Z79890 Hormone replacement therapy (postmenopausal): Secondary | ICD-10-CM | POA: Diagnosis not present

## 2020-08-07 DIAGNOSIS — Z885 Allergy status to narcotic agent status: Secondary | ICD-10-CM | POA: Insufficient documentation

## 2020-08-07 DIAGNOSIS — I2510 Atherosclerotic heart disease of native coronary artery without angina pectoris: Secondary | ICD-10-CM

## 2020-08-07 DIAGNOSIS — Z87891 Personal history of nicotine dependence: Secondary | ICD-10-CM | POA: Insufficient documentation

## 2020-08-07 DIAGNOSIS — I4891 Unspecified atrial fibrillation: Secondary | ICD-10-CM | POA: Diagnosis present

## 2020-08-07 DIAGNOSIS — Z7982 Long term (current) use of aspirin: Secondary | ICD-10-CM | POA: Diagnosis not present

## 2020-08-07 DIAGNOSIS — E785 Hyperlipidemia, unspecified: Secondary | ICD-10-CM | POA: Diagnosis present

## 2020-08-07 DIAGNOSIS — I480 Paroxysmal atrial fibrillation: Secondary | ICD-10-CM | POA: Diagnosis not present

## 2020-08-07 DIAGNOSIS — I25110 Atherosclerotic heart disease of native coronary artery with unstable angina pectoris: Secondary | ICD-10-CM | POA: Insufficient documentation

## 2020-08-07 DIAGNOSIS — E039 Hypothyroidism, unspecified: Secondary | ICD-10-CM | POA: Insufficient documentation

## 2020-08-07 HISTORY — PX: LEFT HEART CATH AND CORONARY ANGIOGRAPHY: CATH118249

## 2020-08-07 SURGERY — LEFT HEART CATH AND CORONARY ANGIOGRAPHY
Anesthesia: LOCAL

## 2020-08-07 MED ORDER — SODIUM CHLORIDE 0.9 % WEIGHT BASED INFUSION
3.0000 mL/kg/h | INTRAVENOUS | Status: AC
  Administered 2020-08-07: 3 mL/kg/h via INTRAVENOUS

## 2020-08-07 MED ORDER — HEPARIN SODIUM (PORCINE) 1000 UNIT/ML IJ SOLN
INTRAMUSCULAR | Status: DC | PRN
  Administered 2020-08-07: 5000 [IU] via INTRAVENOUS

## 2020-08-07 MED ORDER — SODIUM CHLORIDE 0.9% FLUSH: 3.0000 mL | Freq: Two times a day (BID) | INTRAVENOUS | Status: DC

## 2020-08-07 MED ORDER — SODIUM CHLORIDE 0.9 % WEIGHT BASED INFUSION: 1.0000 mL/kg/h | INTRAVENOUS | Status: DC

## 2020-08-07 MED ORDER — ONDANSETRON HCL 4 MG/2ML IJ SOLN: 4.0000 mg | Freq: Four times a day (QID) | INTRAMUSCULAR | Status: DC | PRN

## 2020-08-07 MED ORDER — SODIUM CHLORIDE 0.9 % IV SOLN: 250.0000 mL | INTRAVENOUS | Status: DC | PRN

## 2020-08-07 MED ORDER — HEPARIN SODIUM (PORCINE) 1000 UNIT/ML IJ SOLN
INTRAMUSCULAR | Status: AC
  Filled 2020-08-07: qty 1

## 2020-08-07 MED ORDER — ASPIRIN 81 MG PO CHEW: 81.0000 mg | CHEWABLE_TABLET | ORAL | Status: DC

## 2020-08-07 MED ORDER — ACETAMINOPHEN 325 MG PO TABS: 650.0000 mg | ORAL_TABLET | ORAL | Status: DC | PRN

## 2020-08-07 MED ORDER — MIDAZOLAM HCL 2 MG/2ML IJ SOLN
INTRAMUSCULAR | Status: DC | PRN
  Administered 2020-08-07: 1 mg via INTRAVENOUS

## 2020-08-07 MED ORDER — HEPARIN (PORCINE) IN NACL 1000-0.9 UT/500ML-% IV SOLN
INTRAVENOUS | Status: AC
  Filled 2020-08-07: qty 1000

## 2020-08-07 MED ORDER — HEPARIN (PORCINE) IN NACL 1000-0.9 UT/500ML-% IV SOLN
INTRAVENOUS | Status: DC | PRN
  Administered 2020-08-07 (×3): 500 mL

## 2020-08-07 MED ORDER — HEPARIN (PORCINE) IN NACL 1000-0.9 UT/500ML-% IV SOLN
INTRAVENOUS | Status: AC
  Filled 2020-08-07: qty 500

## 2020-08-07 MED ORDER — VERAPAMIL HCL 2.5 MG/ML IV SOLN
INTRAVENOUS | Status: DC | PRN
  Administered 2020-08-07: 10 mL via INTRA_ARTERIAL

## 2020-08-07 MED ORDER — SODIUM CHLORIDE 0.9% FLUSH: 3.0000 mL | INTRAVENOUS | Status: DC | PRN

## 2020-08-07 MED ORDER — IOHEXOL 350 MG/ML SOLN
INTRAVENOUS | Status: DC | PRN
  Administered 2020-08-07: 50 mL

## 2020-08-07 MED ORDER — LIDOCAINE HCL (PF) 1 % IJ SOLN
INTRAMUSCULAR | Status: AC
  Filled 2020-08-07: qty 30

## 2020-08-07 MED ORDER — VERAPAMIL HCL 2.5 MG/ML IV SOLN
INTRAVENOUS | Status: AC
  Filled 2020-08-07: qty 2

## 2020-08-07 MED ORDER — LIDOCAINE HCL (PF) 1 % IJ SOLN
INTRAMUSCULAR | Status: DC | PRN
  Administered 2020-08-07: 2 mL

## 2020-08-07 MED ORDER — FENTANYL CITRATE (PF) 100 MCG/2ML IJ SOLN
INTRAMUSCULAR | Status: AC
  Filled 2020-08-07: qty 2

## 2020-08-07 MED ORDER — FENTANYL CITRATE (PF) 100 MCG/2ML IJ SOLN
INTRAMUSCULAR | Status: DC | PRN
  Administered 2020-08-07: 25 ug via INTRAVENOUS

## 2020-08-07 MED ORDER — MIDAZOLAM HCL 2 MG/2ML IJ SOLN
INTRAMUSCULAR | Status: AC
  Filled 2020-08-07: qty 2

## 2020-08-07 SURGICAL SUPPLY — 9 items
CATH 5FR JL3.5 JR4 ANG PIG MP (CATHETERS) ×2 IMPLANT
DEVICE RAD COMP TR BAND LRG (VASCULAR PRODUCTS) ×2 IMPLANT
GLIDESHEATH SLEND SS 6F .021 (SHEATH) ×2 IMPLANT
GUIDEWIRE INQWIRE 1.5J.035X260 (WIRE) ×1 IMPLANT
INQWIRE 1.5J .035X260CM (WIRE) ×2
KIT HEART LEFT (KITS) ×2 IMPLANT
PACK CARDIAC CATHETERIZATION (CUSTOM PROCEDURE TRAY) ×2 IMPLANT
TRANSDUCER W/STOPCOCK (MISCELLANEOUS) ×2 IMPLANT
TUBING CIL FLEX 10 FLL-RA (TUBING) ×2 IMPLANT

## 2020-08-07 NOTE — Interval H&P Note (Signed)
History and Physical Interval Note:  08/07/2020 7:08 AM  Marvin Gill  has presented today for surgery, with the diagnosis of angina.  The various methods of treatment have been discussed with the patient and family. After consideration of risks, benefits and other options for treatment, the patient has consented to  Procedure(s): LEFT HEART CATH AND CORONARY ANGIOGRAPHY (N/A) as a surgical intervention.  The patient's history has been reviewed, patient examined, no change in status, stable for surgery.  I have reviewed the patient's chart and labs.  Questions were answered to the patient's satisfaction.   Cath Lab Visit (complete for each Cath Lab visit)  Clinical Evaluation Leading to the Procedure:   ACS: Yes.    Non-ACS:    Anginal Classification: CCS III  Anti-ischemic medical therapy: No Therapy  Non-Invasive Test Results: No non-invasive testing performed  Prior CABG: No previous CABG        [name] 08/07/2020 7:09 AM

## 2020-08-07 NOTE — Discharge Instructions (Signed)
Radial Site Care  This sheet gives you information about how to care for yourself after your procedure. Your health care provider may also give you more specific instructions. If you have problems or questions, contact your health care provider. What can I expect after the procedure? After the procedure, it is common to have:  Bruising and tenderness at the catheter insertion area. Follow these instructions at home: Medicines  Take over-the-counter and prescription medicines only as told by your health care provider. Insertion site care 1. Follow instructions from your health care provider about how to take care of your insertion site. Make sure you: ? Wash your hands with soap and water before you remove your bandage (dressing). If soap and water are not available, use hand sanitizer. ? May remove dressing in 24 hours. 2. Check your insertion site every day for signs of infection. Check for: ? Redness, swelling, or pain. ? Fluid or blood. ? Pus or a bad smell. ? Warmth. 3. Do no take baths, swim, or use a hot tub for 5 days. 4. You may shower 24-48 hours after the procedure. ? Remove the dressing and gently wash the site with plain soap and water. ? Pat the area dry with a clean towel. ? Do not rub the site. That could cause bleeding. 5. Do not apply powder or lotion to the site. Activity  1. For 24 hours after the procedure, or as directed by your health care provider: ? Do not flex or bend the affected arm. ? Do not push or pull heavy objects with the affected arm. ? Do not drive yourself home from the hospital or clinic. You may drive 24 hours after the procedure. ? Do not operate machinery or power tools. ? KEEP ARM ELEVATED THE REMAINDER OF THE DAY. 2. Do not push, pull or lift anything that is heavier than 10 lb for 5 days. 3. Ask your health care provider when it is okay to: ? Return to work or school. ? Resume usual physical activities or sports. ? Resume sexual  activity. General instructions  If the catheter site starts to bleed, raise your arm and put firm pressure on the site. If the bleeding does not stop, get help right away. This is a medical emergency.  DRINK PLENTY OF FLUIDS FOR THE NEXT 2-3 DAYS.  No alcohol consumption for 24 hours after receiving sedation.  If you went home on the same day as your procedure, a responsible adult should be with you for the first 24 hours after you arrive home.  Keep all follow-up visits as told by your health care provider. This is important. Contact a health care provider if:  You have a fever.  You have redness, swelling, or yellow drainage around your insertion site. Get help right away if:  You have unusual pain at the radial site.  The catheter insertion area swells very fast.  The insertion area is bleeding, and the bleeding does not stop when you hold steady pressure on the area.  Your arm or hand becomes pale, cool, tingly, or numb. These symptoms may represent a serious problem that is an emergency. Do not wait to see if the symptoms will go away. Get medical help right away. Call your local emergency services (911 in the [place] Do not drive yourself to the hospital. Summary  After the procedure, it is common to have bruising and tenderness at the site.  Follow instructions from your health care provider about how to take care   of your radial site wound. Check the wound every day for signs of infection.  This information is not intended to replace advice given to you by your health care provider. Make sure you discuss any questions you have with your health care provider. Document Revised: 06/22/2017 Document Reviewed: 06/22/2017 Elsevier Patient Education  2020 [employer]vier Inc. 

## 2020-08-07 NOTE — Progress Notes (Signed)
Called Dr [name] to clarify when pt is to restart his eliquis.  Pt instructed to restart tonight.  Pt ambulated without difficulty or bleeding.   Discharged home with friend [name] who will drive pt home.  Pt's granddaughter will stay with pt 24 hrs stay with pt x 24 hrs

## 2020-08-12 DIAGNOSIS — G8929 Other chronic pain: Secondary | ICD-10-CM | POA: Diagnosis not present

## 2020-08-12 DIAGNOSIS — E7800 Pure hypercholesterolemia, unspecified: Secondary | ICD-10-CM | POA: Diagnosis not present

## 2020-08-12 DIAGNOSIS — I480 Paroxysmal atrial fibrillation: Secondary | ICD-10-CM | POA: Diagnosis not present

## 2020-08-12 DIAGNOSIS — I2510 Atherosclerotic heart disease of native coronary artery without angina pectoris: Secondary | ICD-10-CM | POA: Diagnosis not present

## 2020-08-12 DIAGNOSIS — M169 Osteoarthritis of hip, unspecified: Secondary | ICD-10-CM | POA: Diagnosis not present

## 2020-08-12 DIAGNOSIS — E039 Hypothyroidism, unspecified: Secondary | ICD-10-CM | POA: Diagnosis not present

## 2020-08-12 DIAGNOSIS — K219 Gastro-esophageal reflux disease without esophagitis: Secondary | ICD-10-CM | POA: Diagnosis not present

## 2020-08-12 DIAGNOSIS — N1831 Chronic kidney disease, stage 3a: Secondary | ICD-10-CM | POA: Diagnosis not present

## 2020-08-12 DIAGNOSIS — N179 Acute kidney failure, unspecified: Secondary | ICD-10-CM | POA: Diagnosis not present

## 2020-08-14 ENCOUNTER — Telehealth: Payer: Self-pay | Admitting: *Deleted

## 2020-08-14 NOTE — Telephone Encounter (Signed)
08/13/20 per Dr Acie Fredrickson: Patient has mild cad  We need to follow his lipids and BP regularly  Please schedule follow up with me or APP in 6 months   Pt aware appt scheduled with Dr Acie Fredrickson 02/09/21 2 PM.

## 2020-08-21 LAB — BASIC METABOLIC PANEL
BUN/Creatinine Ratio: 16 (ref 10–24)
BUN: 21 mg/dL (ref 8–27)
CO2: 20 mmol/L (ref 20–29)
Calcium: 9.4 mg/dL (ref 8.6–10.2)
Chloride: 103 mmol/L (ref 96–106)
Creatinine, Ser: 1.29 mg/dL — ABNORMAL HIGH (ref 0.76–1.27)
Glucose: 98 mg/dL (ref 65–99)
Potassium: 4.2 mmol/L (ref 3.5–5.2)
Sodium: 141 mmol/L (ref 134–144)
eGFR: 59 mL/min/{1.73_m2} — ABNORMAL LOW (ref >59)

## 2020-08-21 LAB — SPECIMEN STATUS REPORT

## 2020-08-29 DIAGNOSIS — N400 Enlarged prostate without lower urinary tract symptoms: Secondary | ICD-10-CM | POA: Diagnosis not present

## 2020-08-29 DIAGNOSIS — E039 Hypothyroidism, unspecified: Secondary | ICD-10-CM | POA: Diagnosis not present

## 2020-08-29 DIAGNOSIS — K219 Gastro-esophageal reflux disease without esophagitis: Secondary | ICD-10-CM | POA: Diagnosis not present

## 2020-08-29 DIAGNOSIS — I480 Paroxysmal atrial fibrillation: Secondary | ICD-10-CM | POA: Diagnosis not present

## 2020-08-29 DIAGNOSIS — M797 Fibromyalgia: Secondary | ICD-10-CM | POA: Diagnosis not present

## 2020-08-29 DIAGNOSIS — I2510 Atherosclerotic heart disease of native coronary artery without angina pectoris: Secondary | ICD-10-CM | POA: Diagnosis not present

## 2020-08-29 DIAGNOSIS — F419 Anxiety disorder, unspecified: Secondary | ICD-10-CM | POA: Diagnosis not present

## 2020-08-29 DIAGNOSIS — E7800 Pure hypercholesterolemia, unspecified: Secondary | ICD-10-CM | POA: Diagnosis not present

## 2020-09-02 DIAGNOSIS — H2513 Age-related nuclear cataract, bilateral: Secondary | ICD-10-CM | POA: Diagnosis not present

## 2020-09-02 DIAGNOSIS — H16223 Keratoconjunctivitis sicca, not specified as Sjogren's, bilateral: Secondary | ICD-10-CM | POA: Diagnosis not present

## 2020-09-02 DIAGNOSIS — H01021 Squamous blepharitis right upper eyelid: Secondary | ICD-10-CM | POA: Diagnosis not present

## 2020-09-02 DIAGNOSIS — H43813 Vitreous degeneration, bilateral: Secondary | ICD-10-CM | POA: Diagnosis not present

## 2020-09-23 DIAGNOSIS — N3021 Other chronic cystitis with hematuria: Secondary | ICD-10-CM | POA: Diagnosis not present

## 2020-09-23 DIAGNOSIS — N130 Hydronephrosis with ureteropelvic junction obstruction: Secondary | ICD-10-CM | POA: Diagnosis not present

## 2020-10-24 DIAGNOSIS — J019 Acute sinusitis, unspecified: Secondary | ICD-10-CM | POA: Diagnosis not present

## 2020-10-25 ENCOUNTER — Other Ambulatory Visit: Payer: Self-pay | Admitting: Internal Medicine

## 2020-10-28 NOTE — Telephone Encounter (Signed)
Pt's age 70, wt 99.8 kg, SCr 1.29, CrCl 63.02, last ov w/ PN 08/05/20.

## 2020-12-04 DIAGNOSIS — E039 Hypothyroidism, unspecified: Secondary | ICD-10-CM | POA: Diagnosis not present

## 2020-12-04 DIAGNOSIS — E7800 Pure hypercholesterolemia, unspecified: Secondary | ICD-10-CM | POA: Diagnosis not present

## 2020-12-04 DIAGNOSIS — R42 Dizziness and giddiness: Secondary | ICD-10-CM | POA: Diagnosis not present

## 2020-12-12 ENCOUNTER — Telehealth: Payer: Self-pay | Admitting: *Deleted

## 2020-12-12 ENCOUNTER — Other Ambulatory Visit: Payer: Self-pay | Admitting: Pediatric Infectious Disease

## 2020-12-12 ENCOUNTER — Other Ambulatory Visit: Payer: Self-pay | Admitting: Physical Medicine and Rehabilitation

## 2020-12-12 ENCOUNTER — Other Ambulatory Visit: Payer: Self-pay | Admitting: Internal Medicine

## 2020-12-12 DIAGNOSIS — I25119 Atherosclerotic heart disease of native coronary artery with unspecified angina pectoris: Secondary | ICD-10-CM | POA: Insufficient documentation

## 2020-12-12 DIAGNOSIS — M5416 Radiculopathy, lumbar region: Secondary | ICD-10-CM

## 2020-12-12 NOTE — Telephone Encounter (Signed)
   [facility] Pre-operative Risk Assessment    Patient Name: Marvin Gill  DOB: Aug 24, 1950 MRN: [id number]  HEARTCARE STAFF:  - IMPORTANT!!!!!! Under Visit Info/Reason for Call, type in Other and utilize the format Clearance MM/DD/YY or Clearance TBD. Do not use dashes or single digits. - Please review there is not already an duplicate clearance open for this procedure. - If request is for dental extraction, please clarify the # of teeth to be extracted. - If the patient is currently at the dentist's office, call Pre-Op Callback Staff (MA/nurse) to input urgent request.  - If the patient is not currently in the dentist office, please route to the Pre-Op pool.  Request for surgical clearance:  What type of surgery is being performed? LUMBAR NERVE ROOT BLOCK INJECTION  When is this surgery scheduled? TBD  What type of clearance is required (medical clearance vs. Pharmacy clearance to hold med vs. Both)? BOTH  Are there any medications that need to be held prior to surgery and how long?  ELIQUIS x 2 DAYS PRIOR  Practice name and name of physician performing surgery? [facility]; SURGEON NOT LISTED   What is the office phone number? [phone]   7.   What is the office fax number? [phone] ATTN: CATHY C   8.   Anesthesia type (None, local, MAC, general) ? LOCAL   Julaine Hua 12/12/2020, 10:08 AM  _________________________________________________________________   (provider comments below)

## 2020-12-12 NOTE — Telephone Encounter (Signed)
Patient with diagnosis of afib on Eliquis for anticoagulation.    Procedure: lumbar nerve root block injection Date of procedure: TBD  CHA2DS2-VASc Score = 2  This indicates a 2.2% annual risk of stroke. The patient's score is based upon: CHF History: No HTN History: No Diabetes History: No Stroke History: No Vascular Disease History: Yes Age Score: 1 Gender Score: 0  CrCl 65mL/min Platelet count 232K  Per office protocol, patient can hold Eliquis for 3 days prior to procedure.

## 2020-12-15 NOTE — Telephone Encounter (Signed)
   Primary Cardiologist: Dorris Carnes, MD  Chart reviewed as part of pre-operative protocol coverage. Given past medical history and time since last visit, based on ACC/AHA guidelines, TALLON GERTZ would be at acceptable risk for the planned procedure without further cardiovascular testing.   Patient with diagnosis of afib on Eliquis for anticoagulation.     Procedure: lumbar nerve root block injection Date of procedure: TBD   CHA2DS2-VASc Score = 2  This indicates a 2.2% annual risk of stroke. The patient's score is based upon: CHF History: No HTN History: No Diabetes History: No Stroke History: No Vascular Disease History: Yes Age Score: 1 Gender Score: 0   CrCl 83mL/min Platelet count 232K   Per office protocol, patient can hold Eliquis for 3 days prior to procedure.  Patient was advised that if he develops new symptoms prior to surgery to contact our office to arrange a follow-up appointment.  He verbalized understanding.  I will route this recommendation to the requesting party via Epic fax function and remove from pre-op pool.  Please call with questions.  Jossie Ng. Tamarius Rosenfield NP-C    12/15/2020, 11:37 AM [address] Office [phone] Fax [phone]

## 2020-12-18 DIAGNOSIS — Z8551 Personal history of malignant neoplasm of bladder: Secondary | ICD-10-CM | POA: Diagnosis not present

## 2020-12-18 DIAGNOSIS — N130 Hydronephrosis with ureteropelvic junction obstruction: Secondary | ICD-10-CM | POA: Diagnosis not present

## 2020-12-18 DIAGNOSIS — N3021 Other chronic cystitis with hematuria: Secondary | ICD-10-CM | POA: Diagnosis not present

## 2020-12-19 ENCOUNTER — Inpatient Hospital Stay: Admission: RE | Admit: 2020-12-19 | Payer: Medicare Other | Source: Ambulatory Visit

## 2020-12-19 NOTE — Discharge Instructions (Signed)
Post Procedure Spinal Discharge Instruction Sheet  You may resume a regular diet and any medications that you routinely take (including pain medications) unless otherwise noted by MD.  No driving day of procedure.  Light activity throughout the rest of the day.  Do not do any strenuous work, exercise, bending or lifting.  The day following the procedure, you can resume normal physical activity but you should refrain from exercising or physical therapy for at least three days thereafter.  You may apply ice to the injection site, 20 minutes on, 20 minutes off, as needed. Do not apply ice directly to skin.    Common Side Effects:  Headaches- take your usual medications as directed by your physician.  Increase your fluid intake.  Caffeinated beverages may be helpful.  Lie flat in bed until your headache resolves.  Restlessness or inability to sleep- you may have trouble sleeping for the next few days.  Ask your referring physician if you need any medication for sleep.  Facial flushing or redness- should subside within a few days.  Increased pain- a temporary increase in pain a day or two following your procedure is not unusual.  Take your pain medication as prescribed by your referring physician.  Leg cramps  Please contact our office at [phone] for the following symptoms: Fever greater than 100 degrees. Headaches unresolved with medication after 2-3 days. Increased swelling, pain, or redness at injection site.   Thank you for visiting [facility] today.  MAY RESUME ELIQUIS 24 HOURS POST PROCEDURE TODAY.

## 2020-12-23 ENCOUNTER — Inpatient Hospital Stay: Admission: RE | Admit: 2020-12-23 | Payer: Medicare PPO | Source: Ambulatory Visit

## 2020-12-30 ENCOUNTER — Ambulatory Visit
Admission: RE | Admit: 2020-12-30 | Discharge: 2020-12-30 | Disposition: A | Discharge status: Home / Self Care | Payer: Medicare PPO | Source: Ambulatory Visit | Attending: Physical Medicine and Rehabilitation | Admitting: Physical Medicine and Rehabilitation

## 2020-12-30 ENCOUNTER — Other Ambulatory Visit: Payer: Self-pay | Admitting: Physical Medicine and Rehabilitation

## 2020-12-30 ENCOUNTER — Other Ambulatory Visit: Payer: Self-pay

## 2020-12-30 DIAGNOSIS — M5416 Radiculopathy, lumbar region: Secondary | ICD-10-CM

## 2020-12-30 DIAGNOSIS — M47817 Spondylosis without myelopathy or radiculopathy, lumbosacral region: Secondary | ICD-10-CM | POA: Diagnosis not present

## 2020-12-30 MED ORDER — METHYLPREDNISOLONE ACETATE 40 MG/ML INJ SUSP (RADIOLOG
80.0000 mg | Freq: Once | INTRAMUSCULAR | Status: AC
  Administered 2020-12-30: 80 mg via EPIDURAL

## 2020-12-30 MED ORDER — IOPAMIDOL (ISOVUE-M 200) INJECTION 41%
1.0000 mL | Freq: Once | INTRAMUSCULAR | Status: AC
  Administered 2020-12-30: 1 mL via EPIDURAL

## 2020-12-30 NOTE — Discharge Instructions (Signed)

## 2021-01-22 DIAGNOSIS — E039 Hypothyroidism, unspecified: Secondary | ICD-10-CM | POA: Diagnosis not present

## 2021-01-22 DIAGNOSIS — E7800 Pure hypercholesterolemia, unspecified: Secondary | ICD-10-CM | POA: Diagnosis not present

## 2021-01-22 DIAGNOSIS — I480 Paroxysmal atrial fibrillation: Secondary | ICD-10-CM | POA: Diagnosis not present

## 2021-01-22 DIAGNOSIS — N1831 Chronic kidney disease, stage 3a: Secondary | ICD-10-CM | POA: Diagnosis not present

## 2021-01-22 DIAGNOSIS — K219 Gastro-esophageal reflux disease without esophagitis: Secondary | ICD-10-CM | POA: Diagnosis not present

## 2021-01-22 DIAGNOSIS — F329 Major depressive disorder, single episode, unspecified: Secondary | ICD-10-CM | POA: Diagnosis not present

## 2021-01-22 DIAGNOSIS — G8929 Other chronic pain: Secondary | ICD-10-CM | POA: Diagnosis not present

## 2021-01-22 DIAGNOSIS — N400 Enlarged prostate without lower urinary tract symptoms: Secondary | ICD-10-CM | POA: Diagnosis not present

## 2021-01-22 DIAGNOSIS — I2510 Atherosclerotic heart disease of native coronary artery without angina pectoris: Secondary | ICD-10-CM | POA: Diagnosis not present

## 2021-02-09 ENCOUNTER — Encounter: Payer: Self-pay | Admitting: Cardiovascular Disease

## 2021-02-09 NOTE — Progress Notes (Signed)
This encounter was created in error - please disregard.

## 2021-02-27 ENCOUNTER — Other Ambulatory Visit: Payer: Self-pay | Admitting: Physical Medicine and Rehabilitation

## 2021-02-27 ENCOUNTER — Telehealth: Payer: Self-pay | Admitting: *Deleted

## 2021-02-27 DIAGNOSIS — M5416 Radiculopathy, lumbar region: Secondary | ICD-10-CM

## 2021-02-27 NOTE — Telephone Encounter (Signed)
Patient with diagnosis of afib on Eliquis for anticoagulation.    Procedure: NERVE ROOT BLOCK Date of procedure: TBD  CHA2DS2-VASc Score = 2   This indicates a 2.2% annual risk of stroke. The patient's score is based upon: CHF History: 0 HTN History: 0 Diabetes History: 0 Stroke History: 0 Vascular Disease History: 1 Age Score: 1 Gender Score: 0      CrCl 74 ml/min  Per office protocol, patient can hold Eliquis for 3 days prior to procedure.

## 2021-02-27 NOTE — Telephone Encounter (Signed)
   [facility] Pre-operative Risk Assessment    Patient Name: Marvin Gill  DOB: 1950/12/18 MRN: [id number]  HEARTCARE STAFF:  - IMPORTANT!!!!!! Under Visit Info/Reason for Call, type in Other and utilize the format Clearance MM/DD/YY or Clearance TBD. Do not use dashes or single digits. - Please review there is not already an duplicate clearance open for this procedure. - If request is for dental extraction, please clarify the # of teeth to be extracted. - If the patient is currently at the dentist's office, call Pre-Op Callback Staff (MA/nurse) to input urgent request.  - If the patient is not currently in the dentist office, please route to the Pre-Op pool.  Request for surgical clearance:  What type of surgery is being performed? NERVE ROOT BLOCK  When is this surgery scheduled? TBD  What type of clearance is required (medical clearance vs. Pharmacy clearance to hold med vs. Both)? BOTH  Are there any medications that need to be held prior to surgery and how long? ELIQUIS x 2 DAYS PRIOR  Practice name and name of physician performing surgery?  [facility]  What is the office phone number? [phone]   7.   What is the office fax number? [place]: CATHY  8.   Anesthesia type (None, local, MAC, general) ? LOCAL   Julaine Hua 02/27/2021, 1:37 PM  _________________________________________________________________   (provider comments below)

## 2021-03-02 NOTE — Telephone Encounter (Signed)
   Primary Cardiologist: Dorris Carnes, MD  Chart reviewed as part of pre-operative protocol coverage. Given past medical history and time since last visit, based on ACC/AHA guidelines, Marvin Gill would be at acceptable risk for the planned procedure without further cardiovascular testing.   Patient with diagnosis of afib on Eliquis for anticoagulation.     Procedure: NERVE ROOT BLOCK Date of procedure: TBD   CHA2DS2-VASc Score = 2   This indicates a 2.2% annual risk of stroke. The patient's score is based upon: CHF History: 0 HTN History: 0 Diabetes History: 0 Stroke History: 0 Vascular Disease History: 1 Age Score: 1 Gender Score: 0       CrCl 74 ml/min   Per office protocol, patient can hold Eliquis for 3 days prior to procedure.  Patient was advised that if he develops new symptoms prior to surgery to contact our office to arrange a follow-up appointment.  He verbalized understanding.  I will route this recommendation to the requesting party via Epic fax function and remove from pre-op pool.  Please call with questions.  Jossie Ng. [name] NP-C    03/02/2021, 8:52 AM [address] Office [phone] Fax [phone]

## 2021-03-06 ENCOUNTER — Other Ambulatory Visit: Payer: Self-pay | Admitting: Physical Medicine and Rehabilitation

## 2021-03-06 ENCOUNTER — Ambulatory Visit
Admission: RE | Admit: 2021-03-06 | Discharge: 2021-03-06 | Disposition: A | Discharge status: Home / Self Care | Payer: Medicare Other | Source: Ambulatory Visit | Attending: Physical Medicine and Rehabilitation | Admitting: Physical Medicine and Rehabilitation

## 2021-03-06 ENCOUNTER — Other Ambulatory Visit: Payer: Self-pay

## 2021-03-06 DIAGNOSIS — M4727 Other spondylosis with radiculopathy, lumbosacral region: Secondary | ICD-10-CM | POA: Diagnosis not present

## 2021-03-06 DIAGNOSIS — M5416 Radiculopathy, lumbar region: Secondary | ICD-10-CM

## 2021-03-06 MED ORDER — IOPAMIDOL (ISOVUE-M 200) INJECTION 41%
1.0000 mL | Freq: Once | INTRAMUSCULAR | Status: AC
  Administered 2021-03-06: 1 mL via EPIDURAL

## 2021-03-06 MED ORDER — METHYLPREDNISOLONE ACETATE 40 MG/ML INJ SUSP (RADIOLOG
80.0000 mg | Freq: Once | INTRAMUSCULAR | Status: AC
  Administered 2021-03-06: 80 mg via EPIDURAL

## 2021-03-06 NOTE — Discharge Instructions (Signed)

## 2021-04-06 DIAGNOSIS — I951 Orthostatic hypotension: Secondary | ICD-10-CM | POA: Diagnosis not present

## 2021-04-06 DIAGNOSIS — Z23 Encounter for immunization: Secondary | ICD-10-CM | POA: Diagnosis not present

## 2021-04-22 ENCOUNTER — Ambulatory Visit: Payer: Self-pay | Admitting: Cardiovascular Disease

## 2021-04-22 ENCOUNTER — Ambulatory Visit: Payer: Medicare Other | Admitting: Cardiovascular Disease

## 2021-04-23 ENCOUNTER — Other Ambulatory Visit: Payer: Self-pay | Admitting: Internal Medicine

## 2021-04-24 DIAGNOSIS — N390 Urinary tract infection, site not specified: Secondary | ICD-10-CM | POA: Diagnosis not present

## 2021-04-24 DIAGNOSIS — M549 Dorsalgia, unspecified: Secondary | ICD-10-CM | POA: Diagnosis not present

## 2021-05-23 ENCOUNTER — Other Ambulatory Visit: Payer: Self-pay | Admitting: Cardiovascular Disease

## 2021-05-26 NOTE — Telephone Encounter (Signed)
Pt last saw Dr Acie Fredrickson 08/05/20, last labs 08/05/20 Creat 1.29, age 70, weight 99.8kg, based on specified criteria pt is on appropriate dosage of Eliquis 5mg  BID for afib.  Will refill rx.

## 2021-05-29 DIAGNOSIS — R300 Dysuria: Secondary | ICD-10-CM | POA: Diagnosis not present

## 2021-06-22 ENCOUNTER — Ambulatory Visit: Payer: Medicare Other | Admitting: Cardiovascular Disease

## 2021-06-22 DIAGNOSIS — Z8551 Personal history of malignant neoplasm of bladder: Secondary | ICD-10-CM | POA: Diagnosis not present

## 2021-06-22 DIAGNOSIS — N3021 Other chronic cystitis with hematuria: Secondary | ICD-10-CM | POA: Diagnosis not present

## 2021-06-22 DIAGNOSIS — R310 Gross hematuria: Secondary | ICD-10-CM | POA: Diagnosis not present

## 2021-06-22 DIAGNOSIS — N130 Hydronephrosis with ureteropelvic junction obstruction: Secondary | ICD-10-CM | POA: Diagnosis not present

## 2021-06-22 DIAGNOSIS — M545 Low back pain, unspecified: Secondary | ICD-10-CM | POA: Diagnosis not present

## 2021-06-22 DIAGNOSIS — R300 Dysuria: Secondary | ICD-10-CM | POA: Diagnosis not present

## 2021-07-09 ENCOUNTER — Telehealth (INDEPENDENT_AMBULATORY_CARE_PROVIDER_SITE_OTHER): Payer: Medicare HMO | Admitting: Cardiovascular Disease

## 2021-07-09 ENCOUNTER — Other Ambulatory Visit: Payer: Self-pay | Admitting: Urology

## 2021-07-09 DIAGNOSIS — N130 Hydronephrosis with ureteropelvic junction obstruction: Secondary | ICD-10-CM | POA: Diagnosis not present

## 2021-07-09 DIAGNOSIS — N3021 Other chronic cystitis with hematuria: Secondary | ICD-10-CM | POA: Diagnosis not present

## 2021-07-09 NOTE — Telephone Encounter (Signed)
° °  Pre-operative Risk Assessment    Patient Name: Marvin Gill  DOB: Oct 03, 1950 MRN: [id number]      Request for Surgical Clearance    Procedure:   Cystoscopy with Ureteroscopy and Stent Placement  Date of Surgery:  Clearance 07/15/21                                 Surgeon:  Dr. Ellison Hughs Surgeon's Group or Practice Name:  [facility] Phone number:  [phone] Ext. [phone] Fax number:  [phone]   Type of Clearance Requested:   - Medical  - Pharmacy:  Hold Apixaban (Eliquis) 3 days prior   Type of Anesthesia:  General    Additional requests/questions:  Please fax a copy of medical clearance to the surgeon's office.  Romilda Garret   07/09/2021, 4:28 PM

## 2021-07-10 ENCOUNTER — Encounter (HOSPITAL_BASED_OUTPATIENT_CLINIC_OR_DEPARTMENT_OTHER): Payer: Self-pay | Admitting: Urology

## 2021-07-10 ENCOUNTER — Other Ambulatory Visit: Payer: Self-pay

## 2021-07-10 NOTE — Telephone Encounter (Signed)
Patient with diagnosis of afib on Eliquis for anticoagulation.    Procedure: Cystoscopy with Ureteroscopy and Stent Placement Date of procedure: 07/15/21  CHA2DS2-VASc Score = 2   This indicates a 2.2% annual risk of stroke. The patient's score is based upon: CHF History: 0 HTN History: 0 Diabetes History: 0 Stroke History: 0 Vascular Disease History: 1 Age Score: 1 Gender Score: 0      CrCl 66.9 ml/min  Per office protocol, patient can hold Eliquis for 3 days prior to procedure.

## 2021-07-10 NOTE — Telephone Encounter (Signed)
° °  Name: Marvin Gill  DOB: [birth year]  MRN: [id number]   Primary Cardiologist: Dorris Carnes, MD  Chart reviewed as part of pre-operative protocol coverage. Patient was contacted 07/10/2021 in reference to pre-operative risk assessment for pending surgery as outlined below.  Marvin Gill was last seen on 08/05/2020 by Dr. Acie Fredrickson.  Since that day, Marvin Gill has done well without any exertional chest pain or worsening dyspnea.  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  I have discussed his recent symptom with his daughter who is a Marine scientist.  She is aware that the patient will need to hold Eliquis for 3 days prior to the procedure and restart as soon as possible afterward at the surgeon's discretion.  Therefore, based on ACC/AHA guidelines, the patient would be at acceptable risk for the planned procedure without further cardiovascular testing.   The patient was advised that if he develops new symptoms prior to surgery to contact our office to arrange for a follow-up visit, and he verbalized understanding.  I will route this recommendation to the requesting party via Epic fax function and remove from pre-op pool. Please call with questions.  [place], [place] 07/10/2021, 12:40 PM

## 2021-07-10 NOTE — Telephone Encounter (Signed)
Clinical pharmacist to review Eliquis 

## 2021-07-10 NOTE — Progress Notes (Signed)
Spoke w/ via phone for pre-op interview---pt Lab needs dos----  I stat             Lab results------ekg 08-05-2020 chart/epic COVID test -----patient states asymptomatic no test needed Arrive at -------1030 am 07-15-2021  NPO after MN NO Solid Food.  Clear liquids from MN until---930 am  Med rec completed Medications to take morning of surgery -----Levothyroxine, Xanax, Duloxetine, Nexium, Gabapentin, Solifenacin Diabetic medication ----- Patient instructed no nail polish to be worn day of surgery Patient instructed to bring photo id and insurance card day of surgery Patient aware to have Driver (ride ) / caregiver    for 24 hours after surgery  Patient Special Instructions -----none Pre-Op special Istructions -----none Patient verbalized understanding of instructions that were given at this phone interview.  Patient denies  any cardiac S & S, shortness of breath, chest pain, fever, cough at this phone interview. Pt states he can climb flight of stairs and do household chores without difficulty, pt states he has never used nitro.  Clearance to stop eliquis 3 days before surgery (pt aware last dose to be 07-11-2021) note dated 07-10-2021 Marcelle Overlie [facility] chart/epic  Cardiac clearance note hao meng ha dated 07-10-2021 for 07-15-2021 surgery chart/epic (pt has appointment to see dr Cathie Olden 07-27-2021   lov dr Cathie Olden 08-05-2020 epic Echo 03-06-2016 epic Cardiac cath 03-06-2018 epic Monitor report 01-18-2019 epic Stress test 06-12-2008 epic [place] aaa 08-20-2019 epic ( normal)

## 2021-07-15 ENCOUNTER — Ambulatory Visit (HOSPITAL_BASED_OUTPATIENT_CLINIC_OR_DEPARTMENT_OTHER)
Admission: RE | Admit: 2021-07-15 | Discharge: 2021-07-15 | Disposition: A | Discharge status: Home / Self Care | Payer: Medicare HMO | Attending: Urology | Admitting: Urology

## 2021-07-15 ENCOUNTER — Ambulatory Visit (HOSPITAL_BASED_OUTPATIENT_CLINIC_OR_DEPARTMENT_OTHER): Payer: Medicare HMO | Admitting: Anesthesiology

## 2021-07-15 ENCOUNTER — Encounter (HOSPITAL_BASED_OUTPATIENT_CLINIC_OR_DEPARTMENT_OTHER)
Admission: RE | Disposition: A | Discharge status: Home / Self Care | Payer: Self-pay | Source: Home / Self Care | Attending: Urology

## 2021-07-15 ENCOUNTER — Encounter (HOSPITAL_BASED_OUTPATIENT_CLINIC_OR_DEPARTMENT_OTHER): Payer: Self-pay | Admitting: Urology

## 2021-07-15 DIAGNOSIS — Z7901 Long term (current) use of anticoagulants: Secondary | ICD-10-CM | POA: Insufficient documentation

## 2021-07-15 DIAGNOSIS — F419 Anxiety disorder, unspecified: Secondary | ICD-10-CM | POA: Diagnosis not present

## 2021-07-15 DIAGNOSIS — Z87891 Personal history of nicotine dependence: Secondary | ICD-10-CM | POA: Insufficient documentation

## 2021-07-15 DIAGNOSIS — I4891 Unspecified atrial fibrillation: Secondary | ICD-10-CM

## 2021-07-15 DIAGNOSIS — I2510 Atherosclerotic heart disease of native coronary artery without angina pectoris: Secondary | ICD-10-CM | POA: Diagnosis not present

## 2021-07-15 DIAGNOSIS — E039 Hypothyroidism, unspecified: Secondary | ICD-10-CM | POA: Diagnosis not present

## 2021-07-15 DIAGNOSIS — N1330 Unspecified hydronephrosis: Secondary | ICD-10-CM | POA: Diagnosis not present

## 2021-07-15 DIAGNOSIS — R69 Illness, unspecified: Secondary | ICD-10-CM | POA: Diagnosis not present

## 2021-07-15 DIAGNOSIS — Z8554 Personal history of malignant neoplasm of ureter: Secondary | ICD-10-CM | POA: Diagnosis not present

## 2021-07-15 DIAGNOSIS — N131 Hydronephrosis with ureteral stricture, not elsewhere classified: Secondary | ICD-10-CM | POA: Diagnosis not present

## 2021-07-15 DIAGNOSIS — N401 Enlarged prostate with lower urinary tract symptoms: Secondary | ICD-10-CM | POA: Diagnosis not present

## 2021-07-15 DIAGNOSIS — M797 Fibromyalgia: Secondary | ICD-10-CM | POA: Diagnosis not present

## 2021-07-15 DIAGNOSIS — Z87442 Personal history of urinary calculi: Secondary | ICD-10-CM | POA: Insufficient documentation

## 2021-07-15 DIAGNOSIS — K219 Gastro-esophageal reflux disease without esophagitis: Secondary | ICD-10-CM | POA: Insufficient documentation

## 2021-07-15 HISTORY — PX: CYSTOSCOPY WITH URETEROSCOPY AND STENT PLACEMENT: SHX6377

## 2021-07-15 LAB — POCT I-STAT, CHEM 8
BUN: 15 mg/dL (ref 8–23)
Calcium, Ion: 1.24 mmol/L (ref 1.15–1.40)
Chloride: 105 mmol/L (ref 98–111)
Creatinine, Ser: 1.3 mg/dL — ABNORMAL HIGH (ref 0.61–1.24)
Glucose, Bld: 91 mg/dL (ref 70–99)
HCT: 43 % (ref 39.0–52.0)
Hemoglobin: 14.6 g/dL (ref 13.0–17.0)
Potassium: 4.7 mmol/L (ref 3.5–5.1)
Sodium: 140 mmol/L (ref 135–145)
TCO2: 26 mmol/L (ref 22–32)

## 2021-07-15 SURGERY — CYSTOURETEROSCOPY, WITH STENT INSERTION
Anesthesia: General | Site: Ureter | Laterality: Left

## 2021-07-15 MED ORDER — GLYCOPYRROLATE PF 0.2 MG/ML IJ SOSY
PREFILLED_SYRINGE | INTRAMUSCULAR | Status: AC
  Filled 2021-07-15: qty 1

## 2021-07-15 MED ORDER — FENTANYL CITRATE (PF) 100 MCG/2ML IJ SOLN
25.0000 ug | INTRAMUSCULAR | Status: DC | PRN
  Administered 2021-07-15: 25 ug via INTRAVENOUS

## 2021-07-15 MED ORDER — GLYCOPYRROLATE 0.2 MG/ML IJ SOLN
INTRAMUSCULAR | Status: DC | PRN
  Administered 2021-07-15: .2 mg via INTRAVENOUS

## 2021-07-15 MED ORDER — OXYCODONE-ACETAMINOPHEN 5-325 MG PO TABS: 1.0000 | ORAL_TABLET | ORAL | 0 refills | Status: DC | PRN

## 2021-07-15 MED ORDER — EPHEDRINE SULFATE (PRESSORS) 50 MG/ML IJ SOLN
INTRAMUSCULAR | Status: DC | PRN
  Administered 2021-07-15: 5 mg via INTRAVENOUS

## 2021-07-15 MED ORDER — PHENYLEPHRINE 40 MCG/ML (10ML) SYRINGE FOR IV PUSH (FOR BLOOD PRESSURE SUPPORT)
PREFILLED_SYRINGE | INTRAVENOUS | Status: AC
  Filled 2021-07-15: qty 10

## 2021-07-15 MED ORDER — IOHEXOL 300 MG/ML  SOLN
INTRAMUSCULAR | Status: DC | PRN
  Administered 2021-07-15: 10 mL

## 2021-07-15 MED ORDER — CIPROFLOXACIN IN D5W 400 MG/200ML IV SOLN
400.0000 mg | Freq: Once | INTRAVENOUS | Status: AC
  Administered 2021-07-15: 400 mg via INTRAVENOUS

## 2021-07-15 MED ORDER — FENTANYL CITRATE (PF) 100 MCG/2ML IJ SOLN
INTRAMUSCULAR | Status: DC | PRN
  Administered 2021-07-15 (×2): 25 ug via INTRAVENOUS
  Administered 2021-07-15: 50 ug via INTRAVENOUS

## 2021-07-15 MED ORDER — PHENAZOPYRIDINE HCL 200 MG PO TABS: 200.0000 mg | ORAL_TABLET | Freq: Three times a day (TID) | ORAL | 0 refills | Status: AC | PRN

## 2021-07-15 MED ORDER — SODIUM CHLORIDE 0.9 % IR SOLN
Status: DC | PRN
  Administered 2021-07-15: 3000 mL

## 2021-07-15 MED ORDER — EPHEDRINE 5 MG/ML INJ
INTRAVENOUS | Status: AC
  Filled 2021-07-15: qty 10

## 2021-07-15 MED ORDER — 0.9 % SODIUM CHLORIDE (POUR BTL) OPTIME
TOPICAL | Status: DC | PRN
Start: 2021-07-15 — End: 2021-07-15
  Administered 2021-07-15: 500 mL

## 2021-07-15 MED ORDER — PROPOFOL 10 MG/ML IV BOLUS
INTRAVENOUS | Status: DC | PRN
  Administered 2021-07-15: 120 mg via INTRAVENOUS
  Administered 2021-07-15: 30 mg via INTRAVENOUS

## 2021-07-15 MED ORDER — ACETAMINOPHEN 500 MG PO TABS: 1000.0000 mg | ORAL_TABLET | Freq: Once | ORAL | Status: DC

## 2021-07-15 MED ORDER — CIPROFLOXACIN IN D5W 400 MG/200ML IV SOLN
INTRAVENOUS | Status: AC
  Filled 2021-07-15: qty 200

## 2021-07-15 MED ORDER — DEXAMETHASONE SODIUM PHOSPHATE 4 MG/ML IJ SOLN
INTRAMUSCULAR | Status: DC | PRN
Start: 2021-07-15 — End: 2021-07-15
  Administered 2021-07-15: 10 mg via INTRAVENOUS

## 2021-07-15 MED ORDER — LACTATED RINGERS IV SOLN: INTRAVENOUS | Status: DC

## 2021-07-15 MED ORDER — LIDOCAINE HCL (CARDIAC) PF 100 MG/5ML IV SOSY
PREFILLED_SYRINGE | INTRAVENOUS | Status: DC | PRN
Start: 2021-07-15 — End: 2021-07-15
  Administered 2021-07-15: 40 mg via INTRAVENOUS

## 2021-07-15 MED ORDER — FENTANYL CITRATE (PF) 100 MCG/2ML IJ SOLN
INTRAMUSCULAR | Status: AC
  Filled 2021-07-15: qty 2

## 2021-07-15 MED ORDER — PROPOFOL 10 MG/ML IV BOLUS
INTRAVENOUS | Status: AC
  Filled 2021-07-15: qty 20

## 2021-07-15 MED ORDER — ONDANSETRON HCL 4 MG/2ML IJ SOLN
INTRAMUSCULAR | Status: DC | PRN
  Administered 2021-07-15: 4 mg via INTRAVENOUS

## 2021-07-15 SURGICAL SUPPLY — 25 items
APL SKNCLS STERI-STRIP NONHPOA (GAUZE/BANDAGES/DRESSINGS)
BAG DRAIN URO-CYSTO SKYTR STRL (DRAIN) ×2 IMPLANT
BAG DRN UROCATH (DRAIN) ×1
BASKET STONE 1.7 NGAGE (UROLOGICAL SUPPLIES) IMPLANT
BASKET ZERO TIP NITINOL 2.4FR (BASKET) ×2 IMPLANT
BENZOIN TINCTURE PRP APPL 2/3 (GAUZE/BANDAGES/DRESSINGS) IMPLANT
BSKT STON RTRVL ZERO TP 2.4FR (BASKET) ×1
CATH URET 5FR 28IN OPEN ENDED (CATHETERS) ×1 IMPLANT
CLOTH BEACON ORANGE TIMEOUT ST (SAFETY) ×2 IMPLANT
FIBER LASER FLEXIVA 365 (UROLOGICAL SUPPLIES) IMPLANT
GLOVE SURG ENC MOIS LTX SZ7.5 (GLOVE) ×2 IMPLANT
GOWN STRL REUS W/TWL XL LVL3 (GOWN DISPOSABLE) ×2 IMPLANT
GUIDEWIRE STR DUAL SENSOR (WIRE) IMPLANT
GUIDEWIRE ZIPWRE .038 STRAIGHT (WIRE) ×2 IMPLANT
IV NS IRRIG 3000ML ARTHROMATIC (IV SOLUTION) ×4 IMPLANT
KIT TURNOVER CYSTO (KITS) ×2 IMPLANT
MANIFOLD NEPTUNE II (INSTRUMENTS) ×2 IMPLANT
NS IRRIG 500ML POUR BTL (IV SOLUTION) ×2 IMPLANT
PACK CYSTO (CUSTOM PROCEDURE TRAY) ×2 IMPLANT
STRIP CLOSURE SKIN 1/2X4 (GAUZE/BANDAGES/DRESSINGS) IMPLANT
SYR 10ML LL (SYRINGE) ×2 IMPLANT
TRACTIP FLEXIVA PULS ID 200XHI (Laser) IMPLANT
TRACTIP FLEXIVA PULSE ID 200 (Laser)
TUBE CONNECTING 12X1/4 (SUCTIONS) IMPLANT
TUBING UROLOGY SET (TUBING) ×2 IMPLANT

## 2021-07-15 NOTE — Anesthesia Postprocedure Evaluation (Signed)
Anesthesia Post Note  Patient: Marvin Gill  Procedure(s) Performed: CYSTOSCOPY WITH URETEROSCOPY AND STENT PLACEMENT (Left: Ureter)     Patient location during evaluation: PACU Anesthesia Type: General Level of consciousness: awake and alert, patient cooperative and oriented Pain management: pain level controlled Vital Signs Assessment: post-procedure vital signs reviewed and stable Respiratory status: spontaneous breathing, nonlabored ventilation and respiratory function stable Cardiovascular status: blood pressure returned to baseline and stable Postop Assessment: no apparent nausea or vomiting, able to ambulate and adequate PO intake Anesthetic complications: no   No notable events documented.  Last Vitals:  Vitals:   07/15/21 1345 07/15/21 1400  BP: 118/79 126/82  Pulse: 63 62  Resp: 14 16  Temp:  (!) 36.4 C  SpO2: 98% 94%    Last Pain:  Vitals:   07/15/21 1400  TempSrc:   PainSc: 5                  Jakobi Thetford,E. Kyira Volkert

## 2021-07-15 NOTE — Anesthesia Preprocedure Evaluation (Addendum)
Anesthesia Evaluation  Patient identified by MRN, date of birth, ID band Patient awake    Reviewed: Allergy & Precautions, NPO status , Patient's Chart, lab work & pertinent test results  History of Anesthesia Complications (+) PONV  Airway Mallampati: II  TM Distance: >3 FB Neck ROM: Full    Dental  (+) Dental Advisory Given   Pulmonary former smoker,    breath sounds clear to auscultation       Cardiovascular (-) angina+ CAD (non-obstructive)  + dysrhythmias Atrial Fibrillation  Rhythm:Regular Rate:Normal  '22 cath: 1. Nonobstructive CAD 2. Normal LV function 3. Normal LVEDP 4. AV calcification without significant gradient by pullback  '19 ECHO: mild LVH, LV systolic function was normal, EF 55% to 60%, normal RVF, no significant valvular abnormalities   Neuro/Psych Anxiety negative neurological ROS     GI/Hepatic Neg liver ROS, GERD  Medicated and Controlled,  Endo/Other  Hypothyroidism   Renal/GU Renal InsufficiencyRenal diseaseH/o stones   H/o bladder cancer    Musculoskeletal  (+) Fibromyalgia -  Abdominal   Peds  Hematology eliquis   Anesthesia Other Findings   Reproductive/Obstetrics                           Anesthesia Physical Anesthesia Plan  ASA: 3  Anesthesia Plan: General   Post-op Pain Management: Tylenol PO (pre-op)* and Toradol IV (intra-op)*   Induction: Intravenous  PONV Risk Score and Plan: 3 and Ondansetron, Dexamethasone and Treatment may vary due to age or medical condition  Airway Management Planned: LMA  Additional Equipment: None  Intra-op Plan:   Post-operative Plan:   Informed Consent: I have reviewed the patients History and Physical, chart, labs and discussed the procedure including the risks, benefits and alternatives for the proposed anesthesia with the patient or authorized representative who has indicated his/her understanding and  acceptance.     Dental advisory given  Plan Discussed with: CRNA and Surgeon  Anesthesia Plan Comments:        Anesthesia Quick Evaluation

## 2021-07-15 NOTE — Discharge Instructions (Addendum)
Post Anesthesia Home Care Instructions  Activity: Get plenty of rest for the remainder of the day. A responsible individual must stay with you for 24 hours following the procedure.  For the next 24 hours, DO NOT: -Drive a car -Paediatric nurse -Drink alcoholic beverages -Take any medication unless instructed by your physician -Make any legal decisions or sign important papers.  Meals: Start with liquid foods such as gelatin or soup. Progress to regular foods as tolerated. Avoid greasy, spicy, heavy foods. If nausea and/or vomiting occur, drink only clear liquids until the nausea and/or vomiting subsides. Call your physician if vomiting continues.  Special Instructions/Symptoms: Your throat may feel dry or sore from the anesthesia or the breathing tube placed in your throat during surgery. If this causes discomfort, gargle with warm salt water. The discomfort should disappear within 24 hours.      [facility] [phone] Post Ureteroscopy With or Without Stent Instructions  Definitions:  Ureter: The duct that transports urine from the kidney to the bladder. Stent:   A plastic hollow tube that is placed into the ureter, from the kidney to the bladder to prevent the ureter from swelling shut.  GENERAL INSTRUCTIONS:  Despite the fact that no skin incisions were used, the area around the ureter and bladder is raw and irritated. The stent is a foreign body which will further irritate the bladder wall. This irritation is manifested by increased frequency of urination, both day and night, and by an increase in the urge to urinate. In some, the urge to urinate is present almost always. Sometimes the urge is strong enough that you may not be able to stop yourself from urinating. The only real cure is to remove the stent and then give time for the bladder wall to heal which can't be done until the danger of the ureter swelling shut has passed, which varies.  You may see some  blood in your urine while the stent is in place and a few days afterwards. Do not be alarmed, even if the urine was clear for a while. Get off your feet and drink lots of fluids until clearing occurs. If you start to pass clots or don't improve, call us.  DIET: You may return to your normal diet immediately. Because of the raw surface of your bladder, alcohol, spicy foods, acid type foods and drinks with caffeine may cause irritation or frequency and should be used in moderation. To keep your urine flowing freely and to avoid constipation, drink plenty of fluids during the day ( 8-10 glasses ). Tip: Avoid cranberry juice because it is very acidic.  ACTIVITY: Your physical activity doesn't need to be restricted. However, if you are very active, you may see some blood in your urine. We suggest that you reduce your activity under these circumstances until the bleeding has stopped.  BOWELS: It is important to keep your bowels regular during the postoperative period. Straining with bowel movements can cause bleeding. A bowel movement every other day is reasonable. Use a mild laxative if needed, such as Milk of Magnesia 2-3 tablespoons, or 2 Dulcolax tablets. Call if you continue to have problems. If you have been taking narcotics for pain, before, during or after your surgery, you may be constipated. Take a laxative if necessary.   MEDICATION: You should resume your pre-surgery medications unless told not to. In addition you will often be given an antibiotic to prevent infection. These should be taken as prescribed until the bottles are finished unless  you are having an unusual reaction to one of the drugs.  PROBLEMS YOU SHOULD REPORT TO [place]: Fevers over 100.5 Fahrenheit. Heavy bleeding, or clots ( See above notes about blood in urine ). Inability to urinate. Drug reactions ( hives, rash, nausea, vomiting, diarrhea ). Severe burning or pain with urination that is not improving.  FOLLOW-UP: You will  need a follow-up appointment to monitor your progress. Call for this appointment at the number listed above. Usually the first appointment will be about three to fourteen days after your surgery.  Next dose of Tylenol/acetaminophen after 2 pm today if needed.

## 2021-07-15 NOTE — Op Note (Signed)
Operative Note  Preoperative diagnosis:  1.  Left hydronephrosis 2.  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  Left hydronephrosis 2.  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  Cystoscopy with diagnostic left ureteroscopy and left ureteral stent placement 2.  Left retrograde pyelogram with intraoperative interpretation of fluoroscopic imaging  Surgeon: Ellison Hughs, MD  Assistants:  None  Anesthesia:  General  Complications:  None  EBL: Less than 5 mL  Specimens: 1.  None  Drains/Catheters: 1.  Left 6 French, 26 cm JJ stent without tether  Intraoperative findings:   Left retrograde pyelogram revealed a severely stenotic left ureteral orifice with a stenotic 4 cm segment of the distal left ureter.  The proximal aspects of the left ureter were uniformly dilated and tortuous.  The left renal pelvis was also severely dilated with no other filling defects. UroLift tine was seen at the 3 o'clock position within the prostatic urethra  Indication:  Marvin Gill is a 71 y.o. 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  The patient subsequently developed chronic left-sided hydronephrosis due to a stenotic left distal ureter.  Over the course of the past several weeks, the patient has been complaining of more discomfort with urination and UTI symptoms.  Follow-up CT scan revealed a severely dilated left collecting system along with marked left renal atrophy.  The patient is here today for diagnostic ureteroscopy to investigate the distal aspects of the left ureter and for stent placement.  He has been consented for the above procedures, voices understanding and wishes to proceed.  Description of procedure:  After informed consent was obtained, the patient was brought to the operating room and general LMA anesthesia was  administered. The patient was then placed in the dorsolithotomy position and prepped and draped in the usual sterile fashion. A timeout was performed. A 23 French rigid cystoscope was then inserted into the urethral meatus and advanced into the bladder under direct vision.  He was found to have a UroLift tine protruding into the prostatic urethra at the 3 o'clock position.  A complete bladder survey revealed no intravesical pathology.  The left ureteral orifice was retracted and appeared stenotic.  The left ureter was not peristalsing urine either.  A Glidewire was then used to intubate the left ureteral orifice and was advanced up to the left renal pelvis, or fluoroscopic guidance.  A 5 French ureteral catheter was then inserted over the Glidewire and into the distal aspects of the left ureter.  A left retrograde pyelogram was obtained, with the findings listed above.  The Glidewire was then reinserted into the ureteral catheter and was advanced up to the left renal pelvis, or fluoroscopic guidance.  Ureteral catheter was then removed, leaving the wire in place.  A semirigid ureteroscope was then advanced alongside the wire and into the distal aspects of the left ureter.  There was no evidence of tumor recurrence within the distal aspects of the left ureter, but it was severely stenotic for approximately 4 cm with marked fibrosis.  The ureteroscope was then removed.  A 6 French, 26 cm JJ stent was then advanced over the wire and into good position within the left collecting system, confirming placement via fluoroscopy.  The patient tolerated the procedure well and was transferred to the postanesthesia in stable condition.  Plan: I will arrange  follow-up in 1 week for office cystoscopy and stent removal

## 2021-07-15 NOTE — H&P (Signed)
PRE-OP H&P  Office Visit Report     07/09/2021   --------------------------------------------------------------------------------   Marvin Gill  MRN: [id number]  DOB: Feb 25, 1951, 71 year old Male    PRIMARY CARE:  Delanna Ahmadi, MD  REFERRING:  Glena Norfolk. Lovena Neighbours, MD  PROVIDER:  Ellison Hughs, M.D.  LOCATION:  [facility] [phone]     --------------------------------------------------------------------------------   CC/HPI: CC: Left ureteral cancer   HPI: Marvin Gill is a 71 year-old male, referred by Dr. 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 He developed a stenotic left ureteral orifice resulting in chronic left hydronephrosis. He also has a history of kidney stones, ED, chronic orchalgia and BPH with lower urinary tract symptoms. The patient was diagnosed with A-fib following his ureteroscopy on 03/03/18.   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 He denies flank pain, nausea, vomiting, dysuria or changes in his LUTS. Of note, he does report random episodes of nausea w/o any other associated sxs that is alleviated with zofran.   05/10/19: The patient is here today for a routine follow-up. He continues to have issues with dysuria despite multiple rounds of abx. Urine culture from 03/26/2019 showed no growth. UA today continues to show evidence of WBCs and RBCs, but no bacteria or other signs of cystitis. He denies flank pain, nausea/vomiting or fever/chills. CTU from 04/05/2019 showed no evidence of distal ureteral cancer recurrence although he was noted to have mild left-sided hydronephrosis, which appears unchanged since his CT from 02/08/2018.    06/21/19: The patient returns to clinic again today with worsening testicular and lower abdominal pain. He states that the left testicular pain is worse when sitting, but is partially alleviated when lying flat. He denies nausea/vomiting or fever/chills. He denies any specific episodes of gross hematuria, but states that his urine will signs appear very concentrated or amber in appearance. He also reports ongoing issues with dysuria. I prescribed a course of doxycycline for 1 week after his last office visit any notes that his urinary symptoms mostly improved after completing the course of antibiotics. His last 3 urine cultures have been negative. He also continues to take tamsulosin BID, Uribel and Elmiron with modest improvement in his LUTS   08/07/19: The patient is here today for a routine follow-up and surveillance cystoscopy. He reports that his dysuria has markedly improved after completing a course of doxycycline and starting Elmiron and Uribel. He continues to take tamsulosin BID and reports persistent episodes of nocturia x 3-4, depending on his fluid intake. Denies interval UTIs, dysuria or hematuria.   11/09/19: Marvin Gill is here today for a routine f/u. He reports marked improvement in his urinary urgency, frequency and dysuria since starting Myrbetriq and daily doxycyline. Denies interval UTIs, dysuria or hematuria. UA today continues to show signs of cystitis.   03/24/20: Marvin Gill is here after his orthopedist found his chronic left sided hydronephrosis on recent MRI during an evaluation of chronic lower back pain. He continues to have minimal urinary tract symptoms since starting daily doxycycline and Myrbetriq. He denies interval episodes of flank pain, dysuria or hematuria.   05/29/2020: Seen back today after referral from primary care for evaluation of worsening kidney function. Creatinine last assessed here was  0.9 in October. Most recently assessed by PCP and noted to be elevated  at 1.3. An u/s was also performed noted continue hydronephrosis on the left side as seen on an MRI of the lumbar spine performed earlier in the fall. The noted hydronephrosis was thought to be a result of having to prior ureteral biopsies on that side and more reflective of a chronic process than an acute finding. Today patient continues tamsulosin twice daily as well as daily doxycycline voiding symptoms grossly stable of he continues to be markedly improved compared to earlier this year. He has been receiving some steroid injections in the lumbar spine from a spine specialist locally which has been quite effective in managing his chronic back pain. He denies dysuria, hematuria or new/worsening left CVA or flank pain/discomfort.   09/23/2020: Here for overdue f/u. His creatinine at last OV was stable at 1.0. He has not followed up since then. He was having what was perceived to be unstable angina so underwent cardiac catheterization earlier last month which did show some stenosis less than 50% of the LAD and RCA. Ejection fraction preserved. A cardiac stent was not placed. Creatinine at time of that procedure was slightly elevated above baseline at 1.29. He continues doxycycline as well as tamsulosin b.i.d. for management of his baseline lower urinary tract symptoms an underlying chronic cystitis. He is overdue for follow-up exam in relation to his underlying bladder cancer diagnosis specifically needing cystoscopy.   From a voiding perspective he continues to respond appropriately with twice daily tamsulosin. Force of stream remains adequate with no increased hesitancy/straining. Denies increased sensation of incomplete bladder emptying. Daytime frequency without significant urgency remains at his baseline. Continues with nocturia 1-2 times nightly. He has had no recurrence of left-sided pain or discomfort suggestive of obstructive uropathy especially in the setting of known left-sided hydronephrosis. Denies  any interval recurrence of gross hematuria. He remains on chronic anticoagulation therapy with Eliquis. Still having some mild yet very intermittent burning with urination that is not persistent by his report. He continues doxycycline in regards to his underlying chronic cystitis diagnosis. He has had no acute exacerbation of symptoms in regards to this.   12/18/20: The patient is here today for a routine follow-up. He continues to take tamsulosin BID, Vesicare 10 mg and daily doxycycline for his BPH/chronic cystitis. He reports stable LUTS until about a week ago when his tamsulosin Rx ran out and he noticed a weaker FOS and more hesitancy. Denies interval UTIs, flank pain, dysuria or hematuria.   06/22/21: The patient is here today with c/o of dysuria for the past 2-3 weeks associated with low back pain and intermittent episodes of gross hematuria. Denies N/V/F/C. Not currently on a suppressive abx. Continues to take tamsulosin BID with stable LUTS.   07/09/21: The patient is seen as a work in today after his CT scan revealed severe left-sided hydronephrosis with poor contrast uptake within the left kidney. He denies any significant left-sided flank pain, but continues to have dysuria despite a prolonged course of cephalexin. He denies interval fever/chills, nausea/vomiting or hematuria.     ALLERGIES: Codeine Derivatives - Nausea Rapaflo CAPS - Other Reaction, unknown    MEDICATIONS: Tamsulosin Hcl 0.4 mg capsule 1 capsule PO BID  ALPRAZolam 1 MG Oral Tablet Oral  Eliquis  Gabapentin 600 mg tablet Oral  Levothyroxine Sodium 50 MCG Oral Tablet Oral  Pravastatin Sodium 40 MG Oral Tablet Oral  Solifenacin Succinate 10 mg tablet 1 tablet PO Daily  Uribel 118 mg-10 mg-40.8 mg-36 mg-0.12 mg capsule 1 capsule PO Q 8 H PRN  Vitamin B Complex     GU PSH: Bladder Instill [place], 2020, 2020, 2020, 2020, 2020 Cysto Bladder Ureth Biopsy - 2020, 2019, 2018 Cysto Remove Stent FB Sim - 2020,  2020, 2019, 2018 Cysto Uretero Biopsy Fulgura, Left - 2020, Left - 2019 Cysto Uretero W/excise Tumor - 2019 Cystoscopy - 12/18/2020, 11/09/2019, 08/09/2019, 05/10/2019, 2019 Cystoscopy Insert Stent, Left - 2020, Left - 2019, Left - 2019, Left - 2019, Right - 2018 Cystoscopy TURBT 2-5 cm - 2019 Locm 300-399Mg /Ml Iodine,1Ml - 04/05/2019, 2020, 2019, 2018 Revise Bladder Neck - 2015 Ureteroscopic laser litho, Left - 2018 UROLIFT - 2017     NON-GU PSH: Heart Surgery (Unspecified)     GU PMH: Chronic cystitis (with hematuria) - 06/22/2021, - 12/18/2020, UA continues to demonstrate persistent pyuria and microscopic hematuria with a small amount of bacteria. This is grossly stable compared to time of last office visit assessment. He will continue suppressive doxycycline in regards to this., - 09/23/2020, - 2020 Dysuria (Stable) - 06/22/2021, - 2018, - 2018 (Worsening, Chronic), Culture urine. No ABX unless culture proven UTI. Uribel 118 mg 1 po TID PRN. Discussed avoidance of bladder irritants and increasing water intake. , - 2018 Gross hematuria - 06/22/2021, (Stable), - 03/26/2019 (Stable), - 2019 (Stable), - 2019, - 2019 History of bladder cancer - 06/22/2021, - 12/18/2020, - 03/26/2019 Hydronephrosis - 06/22/2021, - 12/18/2020, - 09/23/2020, - 05/29/2020 (Stable), - 03/24/2020 (Stable), - 2019, - 2019 Low back pain (Stable) - 06/22/2021, Lower back pain, - 2014 BPH w/LUTS - 12/18/2020, - 09/23/2020, Benign localized prostatic hyperplasia with lower urinary tract symptoms (LUTS), - 2017 Weak Urinary Stream - 12/18/2020 Left testicular pain (Stable) - 2021, - 2019 Lower abdominal pain, unspecified - 2021 Hemorrhagic cystitis - 2020 Urinary Retention (Stable) - 2019, (Stable), - 2019, Acute retention of urine, - 2017 Left ureteral cancer - 2019 Acute Cystitis/UTI - 2019 ED due to arterial insufficiency - 2019, - 2019, - 2019, - 2018 Renal calculus - 2019, - 2019, - 2019, - 2018, - 2018, - 2018,  Nephrolithiasis, - 2014 Ureteral calculus - 2019, - 2019, - 2019, - 2018, - 2018, - 2018, Left, - 2018 Bladder disorder, Unspec - 2019 BPH w/o LUTS - 2019, - 2019, - 2018, - 2017 Microscopic hematuria - 2019 Disorder of male genital organs, unspecified (Acute), Left, Meloxicam 15 mg 1 po daily. Phenazopyridine 200 mg 1 po TID PRN. Instructed to use good scrotal support and heat for discomfort - 2019 Microscopic hematuria (Worsening, Chronic), Send urine for cytology. With progressively worsening microsocpic hematuria and dysuria and hx of tobacco use will have pt RTC for possible cysto w/Dr. Pilar Jarvis. - 2019 Chronic prostatitis - 2018 Nocturia, Nocturia - 2017 Urinary Frequency, Urinary frequency - 2017 Urinary Retention, Unspec, Urinary retention - 2016 Other microscopic hematuria, Microscopic hematuria - 2016 Urinary Tract Inf, Unspec site, Urinary tract infection - 2014      PMH Notes: 2012-08-14 15:58:37 - Note: No Orgasmic Sensation Felt During Ejaculation     NON-GU PMH: Pyuria/other UA findings (Stable) - 03/24/2020, - 03/26/2019 Encounter for general adult medical examination without abnormal findings, Encounter for preventive health examination - 2017 Anxiety, Anxiety - 2014 Personal history of other specified conditions, History of heartburn - 2014 Acute myocardial infarction, unspecified Arthritis Atrial Fibrillation Fibromyalgia    FAMILY HISTORY: Brain tumor - Runs In Family Family Health Status - Father alive at age  2 - Mother Family Health Status Number - Runs In Family Mother Deceased At Age 59 from diabetic complicati - Runs In Family   SOCIAL HISTORY: Marital Status: Married Preferred Language: English; Ethnicity: Not Hispanic Or Latino; Race: White Current Smoking Status: Patient does not smoke anymore.  Does not use smokeless tobacco. Has never drank.  Does not use drugs. Drinks 2 caffeinated drinks per day.    REVIEW OF SYSTEMS:    GU Review Male:    Patient denies frequent urination, hard to postpone urination, burning/ pain with urination, get up at night to urinate, leakage of urine, stream starts and stops, trouble starting your stream, have to strain to urinate , erection problems, and penile pain.  Gastrointestinal (Upper):   Patient denies nausea, vomiting, and indigestion/ heartburn.  Gastrointestinal (Lower):   Patient denies diarrhea and constipation.  Constitutional:   Patient denies fever, night sweats, weight loss, and fatigue.  Skin:   Patient denies skin rash/ lesion and itching.  Eyes:   Patient denies blurred vision and double vision.  Ears/ Nose/ Throat:   Patient denies sinus problems and sore throat.  Hematologic/Lymphatic:   Patient denies swollen glands and easy bruising.  Cardiovascular:   Patient denies leg swelling and chest pains.  Respiratory:   Patient denies cough and shortness of breath.  Endocrine:   Patient denies excessive thirst.  Musculoskeletal:   Patient denies back pain and joint pain.  Neurological:   Patient denies headaches and dizziness.  Psychologic:   Patient denies depression and anxiety.   VITAL SIGNS: None   Complexity of Data:  Records Review:   Previous Patient Records  X-Ray Review: C.T. Abdomen/Pelvis: Reviewed Films. Discussed With Patient.     12/18/20 11/09/19 01/25/14 08/15/12 12/17/10 10/31/09 02/20/08  PSA  Total PSA 0.83 ng/mL 0.94 ng/mL 0.54  0.50  0.57  0.52  0.79     PROCEDURES:         C.T. Hematuria - 74178, E7209      OMNIPAQUE 300 125cc Patient confirmed No Neulasta OnPro Device.         Urinalysis w/Scope Dipstick Dipstick Cont'd Micro  Color: Amber Bilirubin: Neg mg/dL WBC/hpf: NS (Not Seen)  Appearance: Cloudy Ketones: Neg mg/dL RBC/hpf: >60/hpf  Specific Gravity: 1.015 Blood: 3+ ery/uL Bacteria: Rare (0-9/hpf)  pH: 5.5 Protein: Trace mg/dL Cystals: NS (Not Seen)  Glucose: Neg mg/dL Urobilinogen: 0.2 mg/dL Casts: NS (Not Seen)    Nitrites: Neg  Trichomonas: Not Present    Leukocyte Esterase: Neg leu/uL Mucous: Not Present      Epithelial Cells: 0 - 5/hpf      Yeast: NS (Not Seen)      Sperm: Not Present    ASSESSMENT:      ICD-10 Details  1 GU:   Chronic cystitis (with hematuria) - N30.21 Chronic, Stable  2   Hydronephrosis - N13.0 Left, Chronic, Worsening     PLAN:            Medications New Meds: Cipro 500 mg tablet 1 tablet PO BID   #14  0 Refill(s)  Pharmacy Name:  [id number]  Address:  [address]   [place], [place] [id number]  Phone:  [phone]  Fax:  [phone]            Orders Labs Urine Culture          Schedule Return Visit/Planned Activity: Next Available Appointment - Schedule Surgery  Document Letter(s):  Created for Patient: Clinical Summary   Created for Delanna Ahmadi, MD   Created for Dorris Carnes         Notes:   -CT images discussed with the patient. Final read pending. His left-sided hydronephrosis has significantly worsened compared to renal ultrasound from July 2022. Differential diagnosis at this point is recurrence of his urothelial carcinoma, ureteral stricture and/or pyelonephritis.  -The risk, benefits and alternatives of cystoscopy with left ureteroscopy, possible ureteral biopsy and left ureteral stent placement was discussed in detail. Risks include, but are not limited to bleeding, urinary tract infection, ureteral stricture formation, prolonged stent implantation, MI, CVA, DVT and the inherent risk of general anesthesia. He voices understanding and wishes to proceed.  - I will obtain clearance from Dr. Dorris Carnes for him to come off of Eliquis prior to the operation that is tentatively scheduled for next Wednesday.

## 2021-07-15 NOTE — Transfer of Care (Signed)
Immediate Anesthesia Transfer of Care Note  Patient: Marvin Gill  Procedure(s) Performed: CYSTOSCOPY WITH URETEROSCOPY AND STENT PLACEMENT (Left: Ureter)  Patient Location: PACU  Anesthesia Type:General  Level of Consciousness: drowsy and patient cooperative  Airway & Oxygen Therapy: Patient Spontanous Breathing and Patient connected to nasal cannula oxygen  Post-op Assessment: Report given to RN and Post -op Vital signs reviewed and stable  Post vital signs: Reviewed and stable  Last Vitals:  Vitals Value Taken Time  BP 112/77 07/15/21 1326  Temp    Pulse 77 07/15/21 1328  Resp 11 07/15/21 1328  SpO2 93 % 07/15/21 1328  Vitals shown include unvalidated device data.  Last Pain:  Vitals:   07/15/21 1101  TempSrc: Oral  PainSc: 0-No pain      Patients Stated Pain Goal: 8 (96/29/52 8413)  Complications: No notable events documented.

## 2021-07-15 NOTE — Anesthesia Procedure Notes (Signed)
Procedure Name: LMA Insertion Date/Time: 07/15/2021 12:46 PM Performed by: Gwyndolyn Saxon, CRNA Pre-anesthesia Checklist: Patient identified, Emergency Drugs available, Suction available and Patient being monitored Patient Re-evaluated:Patient Re-evaluated prior to induction Oxygen Delivery Method: Circle system utilized Preoxygenation: Pre-oxygenation with 100% oxygen Induction Type: IV induction Ventilation: Mask ventilation without difficulty LMA: LMA inserted LMA Size: 5.0 Number of attempts: 1 Placement Confirmation: positive ETCO2 and breath sounds checked- equal and bilateral Tube secured with: Tape Dental Injury: Teeth and Oropharynx as per pre-operative assessment

## 2021-07-16 ENCOUNTER — Encounter (HOSPITAL_BASED_OUTPATIENT_CLINIC_OR_DEPARTMENT_OTHER): Payer: Self-pay | Admitting: Urology

## 2021-07-23 DIAGNOSIS — N130 Hydronephrosis with ureteropelvic junction obstruction: Secondary | ICD-10-CM | POA: Diagnosis not present

## 2021-07-26 ENCOUNTER — Encounter: Payer: Self-pay | Admitting: Cardiovascular Disease

## 2021-07-26 NOTE — Progress Notes (Signed)
Cardiology Office Note:    Date:  07/27/2021   ID:  EDUIN Gill, DOB [birth year], MRN [id number]  PCP:  Lavone Orn, [place]  Cardiologist:  Dorris Carnes, MD  Advanced Practice Provider:  No care team member to display Electrophysiologist:  None   :[id number]}   Referring MD: Lavone Orn, MD   Chief Complaint  Patient presents with   Coronary Artery Disease        Hyperlipidemia    August 05, 2020   Marvin Gill is a 71 y.o. male with a hx of atrial fib, HLD., hx of hypothyroidism He is seen today as a work in visit after calling in with chest pain  Chest tightness,  Pressure . Heaviness,  Worse with exertion.  No radiation of the pain  Just shortness of breath  Symptoms can last for hours  Short winded.  Even doing easy exertional activieies ( moving chair to another room )  Works in the [employer] at [employer] at a [employer]   Does his own yard work .  No cough, no fever , No covid symptoms   Has a hx of CAD - had POBA by Dr. Gwenlyn Found in 2000  - vessel was too small for stenting  The current pain is not similar to the pain he had 20 years ago  Last lipid level that we have on record is from September, 2020.  His total cholesterol is 178.  The HDL is 40.  Triglyceride levels 126.  The LDL is 115.  Quit smoking years ago  Has a hx of atrial fib,  Is on eliquis  Has difficult in taking a deep breath , no hemoptysis  No long airplane trips, car rides, no leg injuries.  Has been under lots of stress   Has occurred daily for the past several days  Will arrange for cath   Feb. 27, 2023 [name] is seen for further evaluation of his chest pain , hx of atrial fib  Cath from March , 2022 revealed nonobstructive CAD  Needs to have a nephrectomy . ? Renal cell cancer , recurrence from years ago  Is on eliquis  Will be ok to hold eliquis for 2-3 days prior to surgery  He is at low risk for his upcoming surgery .   Covid  shots - 2 scheduled + 1 booster.   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 s/p PTCA to dLAD 2000. b. Normal coronaries in 2003, patent prior PTCA site.   Dizziness    intermittant   Dysrhythmia 2019   AF-Eliquis started   Dysuria    Feeling light headed    intermittant when rolls over in bed in am last time 07-10-2021 resolves after sits up a few minutes per pt   Fibromyalgia    GERD (gastroesophageal reflux disease)    History of kidney stones    History of skin cancer    Hypothyroidism    OA (osteoarthritis)    back, shoulders , hips   PAF (paroxysmal atrial fibrillation) ([place]) 03/05/2018   dx in setting sepsis---  cardiologist--  dr Arville Lime (per dr Denman George note in epic pt was to stop eliquis due to hematuria last dose 09-05-2018)   PONV (postoperative nausea and vomiting)    NAUSEA ALL DAY AFTER 02-01-17 SURGERY, PT WANTS ANESTHESIA LIKE 06-23-15 SURGERY WITH DR  [name]   S/P percutaneous transluminal coronary angioplasty    05-18-1999  to dLAD   Wears glasses     Past Surgical History:  Procedure Laterality Date   CARDIAC CATHETERIZATION  06-14-2001  dr Einar Gip   normal coronary arteries and patent previous dLAD    CARDIOVASCULAR STRESS TEST  06/12/2008   Low risk nuclear study w/ no evidence ischemia/  normal LV funciton and wall motion , ef 60%   COLONOSCOPY WITH PROPOFOL N/A 12/13/2013   Procedure: COLONOSCOPY WITH PROPOFOL;  Surgeon: Garlan Fair, MD;  Location: [facility];  Service: Endoscopy;  Laterality: N/A;   CORONARY ANGIOPLASTY  05-18-1999  dr Tami Ribas   PTCA balloon to dLAD 80% (single vessel disease),  ef 60%   CYSTOSCOPY WITH FULGERATION N/A 08/15/2017   Procedure:  CYSTOSCOP/ BLADDER BIOPSY/TURBT;  Surgeon: Nickie Retort, MD;  Location: [facility];  Service: Urology;  Laterality: N/A;   CYSTOSCOPY WITH INSERTION OF UROLIFT N/A 06/23/2015   Procedure: CYSTOSCOPY WITH INSERTION OF UROLIFT;  Surgeon: Carolan Clines, MD;  Location: [facility];  Service: Urology;  Laterality: N/A;   CYSTOSCOPY WITH STENT PLACEMENT Left 04/21/2018   Procedure: CYSTOSCOPY WITH STENT PLACEMENT;  Surgeon: Bjorn Loser, MD;  Location: [place];  Service: Urology;  Laterality: Left;  Emergent case   CYSTOSCOPY WITH URETEROSCOPY AND STENT PLACEMENT Left 04/07/2018   Procedure: CYSTOSCOPY WITH URETEROSCOPY AND STENT EXCHANGE/ BLADDER BIOPSY/LASER ABLATION OF URETERAL TUMOR;  Surgeon: Ceasar Mons, MD;  Location: [facility];  Service: Urology;  Laterality: Left;  ONLY NEEDS 60 MIN   CYSTOSCOPY WITH URETEROSCOPY AND STENT PLACEMENT Left 11/28/2018   Procedure: CYSTOSCOPY WITH URETEROSCOPY , LEFT URETERAL AND BLADDER BIOPSY, LEFT STENT PLACEMENT;  Surgeon: Ceasar Mons, MD;  Location: [facility];  Service: Urology;  Laterality: Left;   CYSTOSCOPY WITH URETEROSCOPY AND STENT PLACEMENT Left 07/15/2021   Procedure: CYSTOSCOPY WITH URETEROSCOPY AND STENT PLACEMENT;  Surgeon: Ceasar Mons, MD;  Location: [facility];  Service: Urology;  Laterality: Left;  ONLY NEEDS 45 MIN   CYSTOSCOPY/URETEROSCOPY/HOLMIUM LASER/STENT PLACEMENT Bilateral 02/01/2017   Procedure: CYSTOSCOPY/URETEROSCOPY/HOLMIUM LASER/STENT PLACEMENT,STONE BASKETRY, BLADDER BIOPSY;  Surgeon: Nickie Retort, MD;  Location: [facility];  Service: Urology;  Laterality: Bilateral;   ESOPHAGOGASTRODUODENOSCOPY  08-24-2006   LEFT HEART CATH AND CORONARY ANGIOGRAPHY N/A 08/07/2020   Procedure: LEFT HEART CATH AND CORONARY ANGIOGRAPHY;  Surgeon: [name], Peter M, MD;  Location: [place] [facility];  Service: Cardiovascular;   Laterality: N/A;   TRANSTHORACIC ECHOCARDIOGRAM  03/06/2018   mild LVH,  ef 55-60%,  pt in atrial fib. LV diastolic not evaluated/  mild LAE and RAE/  moderate AV calcification with sclerosis , no stenosis or regurg./  mild dilated aortic root, 71mm/  trival MR and TR   TRANSURETHRAL INCISION OF PROSTATE N/A 04/01/2014   Procedure: TRANSURETHRAL INCISION OF THE PROSTATE (TUIP);  Surgeon: Ailene Rud, MD;  Location: [facility];  Service: Urology;  Laterality: N/A;    Current Medications: Current Meds  Medication Sig   ALPRAZolam (XANAX) 1 MG tablet Take 1 mg by mouth 2 (two) times daily.   DULoxetine (CYMBALTA) 30 MG capsule Take 30 mg by  mouth daily.   ELIQUIS 5 MG TABS tablet TAKE 1 TABLET(5 MG) BY MOUTH TWICE DAILY   esomeprazole (NEXIUM) 40 MG capsule Take 40 mg by mouth daily.    famotidine (PEPCID) 20 MG tablet Take 20 mg by mouth at bedtime as needed for heartburn.   gabapentin (NEURONTIN) 800 MG tablet Take 800 mg by mouth 3 (three) times daily.    levothyroxine (SYNTHROID, LEVOTHROID) 50 MCG tablet Take 1 tablet (50 mcg total) by mouth daily before breakfast.   nitroGLYCERIN (NITROSTAT) 0.4 MG SL tablet Place 1 tablet (0.4 mg total) under the tongue every 5 (five) minutes as needed for chest pain.   oxyCODONE-acetaminophen (PERCOCET) 5-325 MG tablet Take 1 tablet by mouth every 4 (four) hours as needed for severe pain.   phenazopyridine (PYRIDIUM) 200 MG tablet Take 1 tablet (200 mg total) by mouth 3 (three) times daily as needed (for pain with urination).   pravastatin (PRAVACHOL) 40 MG tablet Take 40 mg by mouth at bedtime.   sodium chloride (OCEAN) 0.65 % nasal spray Place 1 spray into the nose as needed. At bedtime   solifenacin (VESICARE) 10 MG tablet Take 10 mg by mouth daily.   tamsulosin (FLOMAX) 0.4 MG CAPS capsule Take 0.4 mg by mouth every evening.      Allergies:   Codeine, Duloxetine hcl, Pregabalin, and Vicodin hp [hydrocodone-acetaminophen]    Social History   Socioeconomic History   Marital status: Divorced    Spouse name: Not on file   Number of children: Not on file   Years of education: Not on file   Highest education level: Not on file  Occupational History   Not on file  Tobacco Use   Smoking status: Former    Packs/day: 1.00    Years: 25.00    Pack years: 25.00    Types: Cigarettes    Quit date: 06/01/2003    Years since quitting: 18.1   Smokeless tobacco: Never  Vaping Use   Vaping Use: Never used  Substance and Sexual Activity   Alcohol use: No   Drug use: No   Sexual activity: Not on file  Other Topics Concern   Not on file  Social History Narrative   Not on file   Social Determinants of Health   Financial Resource Strain: Not on file  Food Insecurity: Not on file  Transportation Needs: Not on file  Physical Activity: Not on file  Stress: Not on file  Social Connections: Not on file     Family History: The patient's family history includes Anuerysm in his mother.  ROS:   Please see the history of present illness.     All other systems reviewed and are negative.  EKGs/Labs/Other Studies Reviewed:    The following studies were reviewed today:    Recent Labs: 08/05/2020: ALT 21; Platelets 232 07/15/2021: BUN 15; Creatinine, Ser 1.30; Hemoglobin 14.6; Potassium 4.7; Sodium 140  Recent Lipid Panel    Component Value Date/Time   CHOL 138 08/05/2020 0936   TRIG 87 08/05/2020 0936   HDL 50 08/05/2020 0936   CHOLHDL 2.8 08/05/2020 0936   CHOLHDL 4.5 05/10/2007 0305   VLDL 17 05/10/2007 0305   LDLCALC 71 08/05/2020 0936     Risk Assessment/Calculations:       Physical Exam:    Physical Exam: Blood pressure 120/72, pulse 64, height 6\' 6"  (1.981 m), weight 201 lb 12.8 oz (91.5 kg), SpO2 99 %.  GEN:  Well nourished, well developed in no acute  distress HEENT: Normal NECK: No JVD; No carotid bruits LYMPHATICS: No lymphadenopathy CARDIAC: RRR , no murmurs, rubs,  gallops RESPIRATORY:  Clear to auscultation without rales, wheezing or rhonchi  ABDOMEN: Soft, non-tender, non-distended MUSCULOSKELETAL:  No edema; No deformity  SKIN: Warm and dry NEUROLOGIC:  Alert and oriented x 3   EKG:   Feb.  27, 2023.  NSR at 64.  No ST or T wave changes.    ASSESSMENT:    No diagnosis found.  PLAN:      Unstable angina:   Heart catheterization he was only found to have minor luminal irregularities. He needs to have a nephrectomy.  He is at low risk for his upcoming nephrectomy.  It would be okay for him to hold his Eliquis for 2 to 3 days prior to his nephrectomy.   2.  Hyperlipidemia: Managed by his primary care doctor.   3.  Atrial fibrillation: He has paroxysmal atrial fibrillation.  He has not had any recurrent episodes of atrial fibrillation.     Shared Decision Making/Informed Consent{      Medication Adjustments/Labs and Tests Ordered: Current medicines are reviewed at length with the patient today.  Concerns regarding medicines are outlined above.  No orders of the defined types were placed in this encounter.  No orders of the defined types were placed in this encounter.   There are no Patient Instructions on file for this visit.   Signed, Mertie Moores, MD  07/27/2021 9:42 AM    [facility]

## 2021-07-27 ENCOUNTER — Ambulatory Visit (INDEPENDENT_AMBULATORY_CARE_PROVIDER_SITE_OTHER): Payer: Medicare HMO | Admitting: Cardiovascular Disease

## 2021-07-27 ENCOUNTER — Other Ambulatory Visit: Payer: Self-pay

## 2021-07-27 ENCOUNTER — Encounter: Payer: Self-pay | Admitting: Cardiovascular Disease

## 2021-07-27 VITALS — BP 120/72 | HR 64 | Ht 78.0 in | Wt 201.8 lb

## 2021-07-27 DIAGNOSIS — I25119 Atherosclerotic heart disease of native coronary artery with unspecified angina pectoris: Secondary | ICD-10-CM

## 2021-07-27 DIAGNOSIS — I480 Paroxysmal atrial fibrillation: Secondary | ICD-10-CM

## 2021-07-27 NOTE — Addendum Note (Signed)
Addended by: Thayer Headings on: 07/27/2021 09:58 AM   Modules accepted: Level of Service

## 2021-07-27 NOTE — Telephone Encounter (Signed)
Per pre op provider today Coletta Memos, FNP to forward amended note to pre op pool.

## 2021-07-27 NOTE — Telephone Encounter (Signed)
The patient is at low risk for his upcoming nephrectomy.  It would be okay for him to hold his Eliquis for 2 to 3 days prior to his surgery.

## 2021-07-27 NOTE — Patient Instructions (Signed)
Medication Instructions:  The current medical regimen is effective;  continue present plan and medications.  *If you need a refill on your cardiac medications before your next appointment, please call your pharmacy*  Follow-Up: At [facility], you and your health needs are our priority.  As part of our continuing mission to provide you with exceptional heart care, we have created designated Provider Care Teams.  These Care Teams include your primary Cardiologist (physician) and Advanced Practice Providers (APPs -  Physician Assistants and Nurse Practitioners) who all work together to provide you with the care you need, when you need it.  We recommend signing up for the patient portal called "MyChart".  Sign up information is provided on this After Visit Summary.  MyChart is used to connect with patients for Virtual Visits (Telemedicine).  Patients are able to view lab/test results, encounter notes, upcoming appointments, etc.  Non-urgent messages can be sent to your provider as well.   To learn more about what you can do with MyChart, go to [email or web address].    Your next appointment:   1 year(s)  The format for your next appointment:   In Person  Provider:   Christen Bame, NP or Richardson Dopp, PA-C     {  Thank you for choosing [facility]!!

## 2021-08-10 ENCOUNTER — Other Ambulatory Visit: Payer: Self-pay | Admitting: Urology

## 2021-08-14 NOTE — Progress Notes (Signed)
Left a voicemail with Coni to request pre op orders in epic. ?

## 2021-08-17 NOTE — Progress Notes (Addendum)
DUE TO COVID-19 ONLY ONE VISITOR IS ALLOWED TO COME WITH YOU AND STAY IN THE WAITING ROOM ONLY DURING PRE OP AND PROCEDURE DAY OF SURGERY.  2 VISITOR  MAY VISIT WITH YOU AFTER SURGERY IN YOUR PRIVATE ROOM DURING VISITING HOURS ONLY! ?YOU MAY HAVE ONE PERSON SPEND THE NITE WITH YOU IN YOUR ROOM AFTER SURGERY.   ? ? ? ? Your procedure is scheduled on:  ?  09/01/21  ? Report to [facility] Main  Entrance ? ? Report to admitting at     0515           AM ?DO NOT [place], PICTURE ID OR WALLET DAY OF SURGERY.  ?  ? ? Call this number if you have problems the morning of surgery [phone]  ? ? REMEMBER: NO  SOLID FOODS , CANDY, GUM OR MINTS AFTER MIDNITE THE NITE BEFORE SURGERY .       Marland Kitchen CLEAR LIQUIDS UNTIL    0415am             DAY OF SURGERY.    ? ?CLEAR LIQUID DIET THE DAY BEFORE SURGERY.   ? ? ?CLEAR LIQUID DIET ? ? ?Foods Allowed      ?WATER ?BLACK COFFEE ( SUGAR OK, NO MILK, CREAM OR CREAMER) REGULAR AND DECAF  ?TEA ( SUGAR OK NO MILK, CREAM, OR CREAMER) REGULAR AND DECAF  ?PLAIN JELLO ( NO RED)  ?FRUIT ICES ( NO RED, NO FRUIT PULP)  ?POPSICLES ( NO RED)  ?JUICE- APPLE, WHITE GRAPE AND WHITE CRANBERRY  ?SPORT DRINK LIKE GATORADE ( NO RED)  ?CLEAR BROTH ( VEGETABLE , CHICKEN OR BEEF)                                                               ? ?    ? ?BRUSH YOUR TEETH MORNING OF SURGERY AND RINSE YOUR MOUTH OUT, NO CHEWING GUM CANDY OR MINTS. ?  ? ? Take these medicines the morning of surgery with A SIP OF WATER:  cymbalta, xanax, nexium, vesicare, gabapentin, synthroid  ? ? ?DO NOT TAKE ANY DIABETIC MEDICATIONS DAY OF YOUR SURGERY ?                  ?            You may not have any metal on your body including hair pins and  ?            piercings  Do not wear jewelry, make-up, lotions, powders or perfumes, deodorant ?            Do not wear nail polish on your fingernails.   ?           IF YOU ARE A MALE AND WANT TO SHAVE UNDER ARMS OR LEGS PRIOR TO SURGERY YOU MUST DO SO AT LEAST 48 HOURS  PRIOR TO SURGERY.  ?            Men may shave face and neck. ? ? Do not bring valuables to the hospital. [facility] NOT ?            RESPONSIBLE   FOR VALUABLES. ? Contacts, dentures or bridgework may not be worn into surgery. ? Leave suitcase in the car.  After surgery it may be brought to your room. ? ?  ? Patients discharged the day of surgery will not be allowed to drive home. IF YOU ARE HAVING SURGERY AND GOING HOME THE SAME DAY, YOU MUST HAVE AN ADULT TO DRIVE YOU HOME AND BE WITH YOU FOR 24 HOURS. YOU MAY GO HOME BY TAXI OR UBER OR ORTHERWISE, BUT AN ADULT MUST ACCOMPANY YOU HOME AND STAY WITH YOU FOR 24 HOURS. ?  ? ?            Please read over the following fact sheets you were given: ?_____________________________________________________________________ ? ?[facility] - Preparing for Surgery ?Before surgery, you can play an important role.  Because skin is not sterile, your skin needs to be as free of germs as possible.  You can reduce the number of germs on your skin by washing with CHG (chlorahexidine gluconate) soap before surgery.  CHG is an antiseptic cleaner which kills germs and bonds with the skin to continue killing germs even after washing. ?Please DO NOT use if you have an allergy to CHG or antibacterial soaps.  If your skin becomes reddened/irritated stop using the CHG and inform your nurse when you arrive at Short Stay. ?Do not shave (including legs and underarms) for at least 48 hours prior to the first CHG shower.  You may shave your face/neck. ?Please follow these instructions carefully: ? 1.  Shower with CHG Soap the night before surgery and the  morning of Surgery. ? 2.  If you choose to wash your hair, wash your hair first as usual with your  normal  shampoo. ? 3.  After you shampoo, rinse your hair and body thoroughly to remove the  shampoo.                           4.  Use CHG as you would any other liquid soap.  You can apply chg directly  to the skin and wash  ?                      Gently with a scrungie or clean washcloth. ? 5.  Apply the CHG Soap to your body ONLY FROM THE NECK DOWN.   Do not use on face/ open      ?                     Wound or open sores. Avoid contact with eyes, ears mouth and genitals (private parts).  ?                     Production manager,  Genitals (private parts) with your normal soap. ?            6.  Wash thoroughly, paying special attention to the area where your surgery  will be performed. ? 7.  Thoroughly rinse your body with warm water from the neck down. ? 8.  DO NOT shower/wash with your normal soap after using and rinsing off  the CHG Soap. ?               9.  Pat yourself dry with a clean towel. ?           10.  Wear clean pajamas. ?           11.  Place clean sheets on your bed the night of your first shower and do not  sleep with pets. ?Day of Surgery : ?Do not apply any lotions/deodorants the morning of surgery.  Please wear clean clothes to the hospital/surgery center. ? ?FAILURE TO FOLLOW THESE INSTRUCTIONS MAY RESULT IN THE CANCELLATION OF YOUR SURGERY ?PATIENT SIGNATURE_________________________________ ? ?NURSE SIGNATURE__________________________________ ? ?________________________________________________________________________  ? ? ?           ?

## 2021-08-18 ENCOUNTER — Encounter (HOSPITAL_COMMUNITY)
Admission: RE | Admit: 2021-08-18 | Discharge: 2021-08-18 | Disposition: A | Discharge status: Home / Self Care | Payer: Medicare HMO | Source: Ambulatory Visit | Attending: Urology | Admitting: Urology

## 2021-08-18 ENCOUNTER — Other Ambulatory Visit: Payer: Self-pay

## 2021-08-18 ENCOUNTER — Encounter (HOSPITAL_COMMUNITY): Payer: Self-pay

## 2021-08-18 DIAGNOSIS — Z87891 Personal history of nicotine dependence: Secondary | ICD-10-CM | POA: Diagnosis not present

## 2021-08-18 DIAGNOSIS — I480 Paroxysmal atrial fibrillation: Secondary | ICD-10-CM | POA: Diagnosis not present

## 2021-08-18 DIAGNOSIS — K219 Gastro-esophageal reflux disease without esophagitis: Secondary | ICD-10-CM | POA: Diagnosis not present

## 2021-08-18 DIAGNOSIS — N261 Atrophy of kidney (terminal): Secondary | ICD-10-CM | POA: Insufficient documentation

## 2021-08-18 DIAGNOSIS — Z01818 Encounter for other preprocedural examination: Secondary | ICD-10-CM

## 2021-08-18 DIAGNOSIS — J449 Chronic obstructive pulmonary disease, unspecified: Secondary | ICD-10-CM | POA: Diagnosis not present

## 2021-08-18 DIAGNOSIS — I2510 Atherosclerotic heart disease of native coronary artery without angina pectoris: Secondary | ICD-10-CM | POA: Diagnosis not present

## 2021-08-18 DIAGNOSIS — Z7901 Long term (current) use of anticoagulants: Secondary | ICD-10-CM | POA: Insufficient documentation

## 2021-08-18 DIAGNOSIS — Z01812 Encounter for preprocedural laboratory examination: Secondary | ICD-10-CM | POA: Insufficient documentation

## 2021-08-18 DIAGNOSIS — C662 Malignant neoplasm of left ureter: Secondary | ICD-10-CM | POA: Insufficient documentation

## 2021-08-18 LAB — CBC
HCT: 44 % (ref 39.0–52.0)
Hemoglobin: 14.6 g/dL (ref 13.0–17.0)
MCH: 29.2 pg (ref 26.0–34.0)
MCHC: 33.2 g/dL (ref 30.0–36.0)
MCV: 88 fL (ref 80.0–100.0)
Platelets: 257 10*3/uL (ref 150–400)
RBC: 5 MIL/uL (ref 4.22–5.81)
RDW: 14.3 % (ref 11.5–15.5)
WBC: 8.1 10*3/uL (ref 4.0–10.5)
nRBC: 0 % (ref 0.0–0.2)

## 2021-08-18 LAB — BASIC METABOLIC PANEL
Anion gap: 6 (ref 5–15)
BUN: 15 mg/dL (ref 8–23)
CO2: 27 mmol/L (ref 22–32)
Calcium: 9.3 mg/dL (ref 8.9–10.3)
Chloride: 104 mmol/L (ref 98–111)
Creatinine, Ser: 1.35 mg/dL — ABNORMAL HIGH (ref 0.61–1.24)
GFR, Estimated: 56 mL/min — ABNORMAL LOW (ref >60)
Glucose, Bld: 108 mg/dL — ABNORMAL HIGH (ref 70–99)
Potassium: 4.5 mmol/L (ref 3.5–5.1)
Sodium: 137 mmol/L (ref 135–145)

## 2021-08-18 NOTE — Progress Notes (Addendum)
Anesthesia Review: ? ?PCP: DR Lavone Orn  ?Cardiologist : Dr Harrington Challenger- prmary cardiologist  DR Nahser- Gideon 07/27/21  ?Chest x-ray : ?EKG : 07/27/21  ?08/20/19- AAA duplex  ?Echo : ?Stress test: ?Cardiac Cath : 08/07/20  ?Activity level: can do a flight of stairs without difficulty  ?Sleep Study/ CPAP : none  ?Fasting Blood Sugar :      / Checks Blood Sugar -- times a day:   ?Blood Thinner/ Instructions /Last Dose: ?ASA / Instructions/ Last Dose :   ?Eliquis - Stop 3 days prior per pt  ?Eliquis instructions are in 07/09/21- Telephone encounter along with clearance.  ?07/15/21- Cysto  ?

## 2021-08-20 NOTE — Anesthesia Preprocedure Evaluation (Addendum)
Anesthesia Evaluation  ?Patient identified by MRN, date of birth, ID band ?Patient awake ? ? ? ?Reviewed: ?Allergy & Precautions, NPO status , Patient's Chart, lab work & pertinent test results ? ?History of Anesthesia Complications ?(+) PONV and history of anesthetic complications ? ?Airway ?Mallampati: II ? ?TM Distance: >3 FB ?Neck ROM: Full ? ? ? Dental ?no notable dental hx. ? ?  ?Pulmonary ?COPD, former smoker,  ?  ?Pulmonary exam normal ?breath sounds clear to auscultation ? ? ? ? ? ? Cardiovascular ?+ angina + CAD and + Cardiac Stents  ?Normal cardiovascular exam ?Rhythm:Regular Rate:Normal ? ? ?  ?Neuro/Psych ?negative neurological ROS ? negative psych ROS  ? GI/Hepatic ?Neg liver ROS, GERD  ,  ?Endo/Other  ?Hypothyroidism  ? Renal/GU ?Renal InsufficiencyRenal disease  ?negative genitourinary ?  ?Musculoskeletal ? ?(+) Arthritis , Osteoarthritis,  Fibromyalgia - ? Abdominal ?  ?Peds ?negative pediatric ROS ?(+)  Hematology ?negative hematology ROS ?(+)   ?Anesthesia Other Findings ?Urethral Cancer ? Reproductive/Obstetrics ?negative OB ROS ? ?  ? ? ? ? ? ? ? ? ? ? ? ? ? ?  ?  ? ? ? ? ? ? ? ?Anesthesia Physical ?Anesthesia Plan ? ?ASA: 3 ? ?Anesthesia Plan: General  ? ?Post-op Pain Management:   ? ?Induction: Intravenous ? ?PONV Risk Score and Plan: 3 and Ondansetron, Dexamethasone, Midazolam and Treatment may vary due to age or medical condition ? ?Airway Management Planned: Oral ETT ? ?Additional Equipment:  ? ?Intra-op Plan:  ? ?Post-operative Plan: Extubation in OR ? ?Informed Consent: I have reviewed the patients History and Physical, chart, labs and discussed the procedure including the risks, benefits and alternatives for the proposed anesthesia with the patient or authorized representative who has indicated his/her understanding and acceptance.  ? ? ? ?Dental advisory given ? ?Plan Discussed with: CRNA ? ?Anesthesia Plan Comments: (See PAT note 08/18/2021, Konrad Felix Ward, PA-C)  ? ? ? ? ? ?Anesthesia Quick Evaluation ? ?

## 2021-08-20 NOTE — Progress Notes (Signed)
Anesthesia Chart Review ? ? Case: [id number] Date/Time: 09/01/21 0700  ? Procedure: XI ROBOT ASSITED LAPAROSCOPIC NEPHROURETERECTOMY/ TRANSURETHRAL RESECTION OF LEFT URETERAL ORIFICE (Left)  ? Anesthesia type: General  ? Pre-op diagnosis: LEFT URETERAL CANCER, LEFT RENAL ATROPHY  ? Location[facility]  ? Surgeons: Ceasar Mons, MD  ? ?  ? ? ?DISCUSSION:71 y.o. former smoker with h/o PONV, GERD, COPD, PAF (on Eliquis), CAD, bladder cancer, left ureteral cancer scheduled for above procedure 09/01/2021 with Dr. Harrell Gave Lovena Neighbours.   ? ?Pt last seen by cardiology 07/27/2021. Per OV note, "He needs to have a nephrectomy.  He is at low risk for his upcoming nephrectomy.  It would be okay for him to hold his Eliquis for 2 to 3 days prior to his nephrectomy." ? ?Anticipate pt can proceed with planned procedure barring acute status change.   ?VS: BP 117/90   Pulse 86   Temp 36.7 ?C (Oral)   Resp 16   Ht '6\' 6"'$  (1.981 m)   Wt 91.6 kg   SpO2 100%   BMI 23.34 kg/m?  ? ?PROVIDERS: ?Lavone Orn, MD is PCP  ? ?Cardiologist:  Dorris Carnes, MD  ?LABS: Labs reviewed: Acceptable for surgery. ?(all labs ordered are listed, but only abnormal results are displayed) ? ?Labs Reviewed  ?BASIC METABOLIC PANEL - Abnormal; Notable for the following components:  ?    Result Value  ? Glucose, Bld 108 (*)   ? Creatinine, Ser 1.35 (*)   ? GFR, Estimated 56 (*)   ? All other components within normal limits  ?CBC  ? ? ? ?IMAGES: ? ? ?EKG: ?07/27/2021 ?Rate 64 bpm  ?NSR ? ?CV: ?Cath 08/07/2020 ?Mid RCA to Dist RCA lesion is 30% stenosed. ?Prox LAD to Mid LAD lesion is 20% stenosed. ?Mid LAD to Dist LAD lesion is 30% stenosed. ?Ramus lesion is 45% stenosed. ?The left ventricular systolic function is normal. ?LV end diastolic pressure is normal. ?The left ventricular ejection fraction is 55-65% by visual estimate. ?  ?1. Nonobstructive CAD ?2. Normal LV function ?3. Normal LVEDP ?4. AV calcification without significant gradient by  pullback ?  ?Plan: consider other causes of symptoms ? ?Echo 03/06/2018 ?Left ventricle: The cavity size was normal. Wall thickness was  ?  increased in a pattern of mild LVH. Systolic function was normal.  ?  The estimated ejection fraction was in the range of 55% to 60%.  ?  Although no diagnostic regional wall motion abnormality was  ?  identified, this possibility cannot be completely excluded on the  ?  basis of this study. The study was not technically sufficient to  ?  allow evaluation of LV diastolic dysfunction due to atrial  ?  fibrillation.  ?- Aortic valve: Trileaflet; moderately calcified leaflets.  ?  Sclerosis without stenosis.  ?- Aorta: Mildly dilated aortic root. Aortic root dimension: 37 mm  ?  (ED).  ?- Mitral valve: Mildly calcified annulus. There was trivial  ?  regurgitation.  ?- Left atrium: The atrium was mildly dilated.  ?- Right ventricle: The cavity size was normal. Systolic function  ?  was normal.  ?- Right atrium: The atrium was mildly dilated.  ?- Tricuspid valve: Peak RV-RA gradient (S): 17 mm Hg.  ?- Pulmonary arteries: PA peak pressure: 20 mm Hg (S).  ?- Inferior vena cava: The vessel was normal in size. The  ?  respirophasic diameter changes were in the normal range (>= 50%),  ?  consistent  with normal central venous pressure.  ?Past Medical History:  ?Diagnosis Date  ? Benign localized prostatic hyperplasia with lower urinary tract symptoms (LUTS)   ? Bladder cancer [facility]) urologsit-  dr winter  ? BCG treatment's   ? COPD with emphysema ([place])   ? Coronary artery disease primary cardiologist-- dr Arville Lime  ? a. s/p PTCA to dLAD 2000. b. Normal coronaries in 2003, patent prior PTCA site.  ? Dizziness   ? intermittant  ? Dysrhythmia 2019  ? AF-Eliquis started  ? Dysuria   ? Feeling light headed   ? intermittant when rolls over in bed in am last time 07-10-2021 resolves after sits up a few minutes per pt  ? Fibromyalgia   ? GERD (gastroesophageal reflux disease)   ? History of kidney  stones   ? History of skin cancer   ? Hypothyroidism   ? OA (osteoarthritis)   ? back, shoulders , hips  ? PAF (paroxysmal atrial fibrillation) ([place]) 03/05/2018  ? dx in setting sepsis---  cardiologist-- dr Arville Lime (per dr Denman George note in epic pt was to stop eliquis due to hematuria last dose 09-05-2018)  ? PONV (postoperative nausea and vomiting)   ? NAUSEA ALL DAY AFTER 02-01-17 SURGERY, PT WANTS ANESTHESIA LIKE 06-23-15 SURGERY WITH DR  [name]  ? S/P percutaneous transluminal coronary angioplasty   ? 05-18-1999  to dLAD  ? Wears glasses   ? ? ?Past Surgical History:  ?Procedure Laterality Date  ? CARDIAC CATHETERIZATION  06-14-2001  dr Einar Gip  ? normal coronary arteries and patent previous dLAD   ? CARDIOVASCULAR STRESS TEST  06/12/2008  ? Low risk nuclear study w/ no evidence ischemia/  normal LV funciton and wall motion , ef 60%  ? COLONOSCOPY WITH PROPOFOL N/A 12/13/2013  ? Procedure: COLONOSCOPY WITH PROPOFOL;  Surgeon: Garlan Fair, MD;  [facility];  Service: Endoscopy;  Laterality: N/A;  ? CORONARY ANGIOPLASTY  05-18-1999  dr Tami Ribas  ? PTCA balloon to dLAD 80% (single vessel disease),  ef 60%  ? [place] N/A 08/15/2017  ? Procedure: CYSTOSCOP/ BLADDER BIOPSY/TURBT;  Surgeon: Nickie Retort, MD;  [facility];  Service: Urology;  Laterality: N/A;  ? CYSTOSCOPY WITH INSERTION OF UROLIFT N/A 06/23/2015  ? Procedure: CYSTOSCOPY WITH INSERTION OF UROLIFT;  Surgeon: Carolan Clines, MD;  [facility];  Service: Urology;  Laterality: N/A;  ? CYSTOSCOPY WITH STENT PLACEMENT Left 04/21/2018  ? Procedure: CYSTOSCOPY WITH STENT PLACEMENT;  Surgeon: Bjorn Loser, MD;  Location: [place];  Service: Urology;  Laterality: Left;  Emergent case  ? CYSTOSCOPY WITH URETEROSCOPY AND STENT PLACEMENT Left 04/07/2018  ? Procedure: CYSTOSCOPY WITH URETEROSCOPY AND STENT EXCHANGE/ BLADDER BIOPSY/LASER ABLATION OF URETERAL TUMOR;  Surgeon: Ceasar Mons, MD;  Location: [facility];  Service: Urology;  Laterality: Left;  ONLY NEEDS 60 MIN  ? CYSTOSCOPY WITH URETEROSCOPY AND STENT PLACEMENT Left 11/28/2018  ? Procedure: CYSTOSCOPY WITH URETEROSCOPY , LEFT URETERAL AND BLADDER BIOPSY, LEFT STENT PLACEMENT;  Surgeon: Ceasar Mons, MD;  Location: [facility];  Service: Urology;  Laterality: Left;  ? CYSTOSCOPY WITH URETEROSCOPY AND STENT PLACEMENT Left 07/15/2021  ? Procedure: CYSTOSCOPY WITH URETEROSCOPY AND STENT PLACEMENT;  Surgeon: Ceasar Mons, MD;  Location: [facility];  Service: Urology;  Laterality: Left;  ONLY NEEDS 45 MIN  ? CYSTOSCOPY/URETEROSCOPY/HOLMIUM LASER/STENT PLACEMENT Bilateral 02/01/2017  ? Procedure: CYSTOSCOPY/URETEROSCOPY/HOLMIUM LASER/STENT PLACEMENT,STONE BASKETRY, BLADDER BIOPSY;  Surgeon: Nickie Retort,  MD;  Location: [place];  Service: Urology;  Laterality: Bilateral;  ? ESOPHAGOGASTRODUODENOSCOPY  08-24-2006  ? LEFT HEART CATH AND CORONARY ANGIOGRAPHY N/A 08/07/2020  ? Procedure: LEFT HEART CATH AND CORONARY ANGIOGRAPHY;  Surgeon: [name], Peter M, MD;  Location: [place] [facility];  Service: Cardiovascular;  Laterality: N/A;  ? TRANSTHORACIC ECHOCARDIOGRAM  03/06/2018  ? mild LVH,  ef 55-60%,  pt in atrial fib. LV diastolic not evaluated/  mild LAE and RAE/  moderate AV calcification with sclerosis , no stenosis or regurg./  mild dilated aortic root, 42m/  trival MR and TR  ? TRANSURETHRAL INCISION OF PROSTATE N/A 04/01/2014  ? Procedure: TRANSURETHRAL INCISION OF THE PROSTATE (TUIP);  Surgeon: SAilene Rud MD;  [facility]  Service: Urology;  Laterality: N/A;  ? ? ?MEDICATIONS: ? acetaminophen (TYLENOL) 650 MG CR tablet  ? ALPRAZolam (XANAX) 1 MG tablet  ? ELIQUIS 5 MG TABS tablet  ? esomeprazole (NEXIUM) 40 MG capsule  ? gabapentin (NEURONTIN) 800 MG tablet  ? levothyroxine (SYNTHROID, LEVOTHROID) 50 MCG  tablet  ? nitroGLYCERIN (NITROSTAT) 0.4 MG SL tablet  ? oxyCODONE-acetaminophen (PERCOCET) 5-325 MG tablet  ? phenazopyridine (PYRIDIUM) 200 MG tablet  ? pravastatin (PRAVACHOL) 40 MG tablet  ? sodium chloride

## 2021-08-25 DIAGNOSIS — N3021 Other chronic cystitis with hematuria: Secondary | ICD-10-CM | POA: Diagnosis not present

## 2021-08-25 DIAGNOSIS — N130 Hydronephrosis with ureteropelvic junction obstruction: Secondary | ICD-10-CM | POA: Diagnosis not present

## 2021-08-25 DIAGNOSIS — Z8551 Personal history of malignant neoplasm of bladder: Secondary | ICD-10-CM | POA: Diagnosis not present

## 2021-08-31 NOTE — H&P (Signed)
Urology Preoperative H&P  ? ?Chief Complaint:  Left ureteral obstruction and ureteral carcinoma  ? ?History of Present Illness: Marvin Gill is a 71 y.o. 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 He developed a stenotic left ureteral orifice resulting in chronic left hydronephrosis. He also has a history of kidney stones, ED, chronic orchalgia and BPH with lower urinary tract symptoms.  Surveillance CT scan from February revealed significant worsening in his left-sided hydronephrosis with marked thinning of his left renal cortex.  The patient reports chronic, dull left lower back pain along with chronic dysuria for the past several months.  He denies any fever/chills or interval episodes of hematuria. ? ?Past Medical History:  ?Diagnosis Date  ? Benign localized prostatic hyperplasia with lower urinary tract symptoms (LUTS)   ? Bladder cancer [facility]) urologsit-  dr Esias Mory  ? BCG treatment's   ? COPD with emphysema ([place])   ? Coronary artery disease primary cardiologist-- dr Arville Lime  ? a. s/p PTCA to dLAD 2000. b. Normal coronaries in 2003, patent prior PTCA site.  ? Dizziness   ? intermittant  ? Dysrhythmia 2019  ? AF-Eliquis started  ? Dysuria   ? Feeling light headed   ? intermittant when rolls over in bed in am last time 07-10-2021 resolves after sits up a few minutes per pt  ? Fibromyalgia   ? GERD (gastroesophageal reflux disease)   ? History of kidney stones   ? History of skin cancer   ? Hypothyroidism   ? OA (osteoarthritis)   ? back, shoulders , hips  ? PAF (paroxysmal atrial fibrillation) ([address]) 03/05/2018  ? dx in setting sepsis---  cardiologist-- dr Arville Lime (per dr Denman George note in epic pt was to stop eliquis due to hematuria last dose 09-05-2018)  ? PONV (postoperative nausea and vomiting)   ? NAUSEA ALL DAY AFTER 02-01-17 SURGERY, PT WANTS ANESTHESIA LIKE 06-23-15 SURGERY WITH DR  [name]  ? S/P percutaneous  transluminal coronary angioplasty   ? 05-18-1999  to dLAD  ? Wears glasses   ? ? ?Past Surgical History:  ?Procedure Laterality Date  ? CARDIAC CATHETERIZATION  06-14-2001  dr Einar Gip  ? normal coronary arteries and patent previous dLAD   ? CARDIOVASCULAR STRESS TEST  06/12/2008  ? Low risk nuclear study w/ no evidence ischemia/  normal LV funciton and wall motion , ef 60%  ? COLONOSCOPY WITH PROPOFOL N/A 12/13/2013  ? Procedure: COLONOSCOPY WITH PROPOFOL;  Surgeon: Garlan Fair, MD;  [facility];  Service: Endoscopy;  Laterality: N/A;  ? CORONARY ANGIOPLASTY  05-18-1999  dr Tami Ribas  ? PTCA balloon to dLAD 80% (single vessel disease),  ef 60%  ? [place] N/A 08/15/2017  ? Procedure: CYSTOSCOP/ BLADDER BIOPSY/TURBT;  Surgeon: Nickie Retort, MD;  Location[facility];  Service: Urology;  Laterality: N/A;  ? CYSTOSCOPY WITH INSERTION OF UROLIFT N/A 06/23/2015  ? Procedure: CYSTOSCOPY WITH INSERTION OF UROLIFT;  Surgeon: Carolan Clines, MD;  [facility];  Service: Urology;  Laterality: N/A;  ? CYSTOSCOPY WITH STENT PLACEMENT Left 04/21/2018  ? Procedure: CYSTOSCOPY WITH STENT PLACEMENT;  Surgeon: Bjorn Loser, MD;  Location: [place];  Service: Urology;  Laterality: Left;  Emergent case  ? CYSTOSCOPY WITH URETEROSCOPY AND STENT PLACEMENT Left 04/07/2018  ? Procedure: CYSTOSCOPY WITH URETEROSCOPY AND STENT EXCHANGE/ BLADDER BIOPSY/LASER ABLATION OF URETERAL TUMOR;  Surgeon: Ceasar Mons, MD;  Location:  [place];  Service: Urology;  Laterality: Left;  ONLY NEEDS 60 MIN  ? CYSTOSCOPY WITH URETEROSCOPY AND STENT PLACEMENT Left 11/28/2018  ? Procedure: CYSTOSCOPY WITH URETEROSCOPY , LEFT URETERAL AND BLADDER BIOPSY, LEFT STENT PLACEMENT;  Surgeon: Ceasar Mons, MD;  Location: [facility];  Service: Urology;  Laterality: Left;  ? CYSTOSCOPY WITH URETEROSCOPY AND STENT PLACEMENT Left 07/15/2021  ? Procedure:  CYSTOSCOPY WITH URETEROSCOPY AND STENT PLACEMENT;  Surgeon: Ceasar Mons, MD;  Location: [facility];  Service: Urology;  Laterality: Left;  ONLY NEEDS 45 MIN  ? CYSTOSCOPY/URETEROSCOPY/HOLMIUM LASER/STENT PLACEMENT Bilateral 02/01/2017  ? Procedure: CYSTOSCOPY/URETEROSCOPY/HOLMIUM LASER/STENT PLACEMENT,STONE BASKETRY, BLADDER BIOPSY;  Surgeon: Nickie Retort, MD;  Location: [facility];  Service: Urology;  Laterality: Bilateral;  ? ESOPHAGOGASTRODUODENOSCOPY  08-24-2006  ? LEFT HEART CATH AND CORONARY ANGIOGRAPHY N/A 08/07/2020  ? Procedure: LEFT HEART CATH AND CORONARY ANGIOGRAPHY;  Surgeon: [name], Peter M, MD;  Location: [place] [facility];  Service: Cardiovascular;  Laterality: N/A;  ? TRANSTHORACIC ECHOCARDIOGRAM  03/06/2018  ? mild LVH,  ef 55-60%,  pt in atrial fib. LV diastolic not evaluated/  mild LAE and RAE/  moderate AV calcification with sclerosis , no stenosis or regurg./  mild dilated aortic root, 63m/  trival MR and TR  ? TRANSURETHRAL INCISION OF PROSTATE N/A 04/01/2014  ? Procedure: TRANSURETHRAL INCISION OF THE PROSTATE (TUIP);  Surgeon: SAilene Rud MD;  [facility]  Service: Urology;  Laterality: N/A;  ? ? ?Allergies:  ?Allergies  ?Allergen Reactions  ? Codeine Nausea And Vomiting  ?  "made my head feel funny too"  ? Duloxetine Hcl Nausea And Vomiting  ? Pregabalin   ?  suicidal thoughts with lyrica  ? Vicodin Hp [Hydrocodone-Acetaminophen] Rash  ? ? ?Family History  ?Problem Relation Age of Onset  ? Anuerysm Mother   ?     Brain  ? ? ?Social History:  reports that he quit smoking about 18 years ago. His smoking use included cigarettes. He has a 25.00 pack-year smoking history. He has never used smokeless tobacco. He reports that he does not drink alcohol and does not use drugs. ? ?ROS: ?A complete review of systems was performed.  All systems are negative except for pertinent findings as noted. ? ?Physical Exam:   ?Vital signs in last 24 hours: ?  ?Constitutional:  Alert and oriented, No acute distress ?Cardiovascular: Regular rate and rhythm, No JVD ?Respiratory: Normal respiratory effort, Lungs clear bilaterally ?GI: Abdomen is soft, nontender, nondistended, no abdominal masses ?GU: No CVA tenderness ?Lymphatic: No lymphadenopathy ?Neurologic: Grossly intact, no focal deficits ?Psychiatric: Normal mood and affect ? ?Laboratory Data:  ? ?  Latest Ref Rng & Units 08/18/2021  ? 11:28 AM 07/15/2021  ? 11:13 AM 08/05/2020  ?  9:36 AM  ?CBC  ?WBC 4.0 - 10.5 K/uL 8.1    9.0    ?Hemoglobin 13.0 - 17.0 g/dL 14.6   14.6   15.1    ?Hematocrit 39.0 - 52.0 % 44.0   43.0   45.3    ?Platelets 150 - 400 K/uL 257    232    ?  ? ?Renal Function: ? ?  Latest Ref Rng & Units 08/18/2021  ? 11:28 AM 07/15/2021  ? 11:13 AM 08/05/2020  ?  9:36 AM  ?BMP  ?Glucose 70 - 99 mg/dL 108   91   98    ?BUN 8 - 23 mg/dL 15  15   21    ?Creatinine 0.61 - 1.24 mg/dL 1.35   1.30   1.29    ?BUN/Creat Ratio 10 - 24   16    ?Sodium 135 - 145 mmol/L 137   140   141    ?Potassium 3.5 - 5.1 mmol/L 4.5   4.7   4.2    ?Chloride 98 - 111 mmol/L 104   105   103    ?CO2 22 - 32 mmol/L 27    20    ?Calcium 8.9 - 10.3 mg/dL 9.3    9.4    ? ? ? ?Radiologic Imaging: ?CLINICAL DATA: History of bladder cancer, gross hematuria  ? ?EXAM:  ?CT ABDOMEN AND PELVIS WITHOUT AND WITH CONTRAST  ? ?TECHNIQUE:  ?Multidetector CT imaging of the abdomen and pelvis was performed  ?following the standard protocol before and following the bolus  ?administration of intravenous contrast.  ? ?RADIATION DOSE REDUCTION: This exam was performed according to the  ?departmental dose-optimization program which includes automated  ?exposure control, adjustment of the mA and/or kV according to  ?patient size and/or use of iterative reconstruction technique.  ? ?CONTRAST: 125 mL Omnipaque 300 iodinated contrast IV  ? ?COMPARISON: 04/05/2019  ? ?FINDINGS:  ?Lower chest: No acute abnormality.  ? ?Hepatobiliary:  No solid liver abnormality is seen. No gallstones,  ?gallbladder wall thickening, or biliary dilatation.  ? ?Pancreas: Unremarkable. No pancreatic ductal dilatation or  ?surrounding inflammatory changes.

## 2021-09-01 ENCOUNTER — Inpatient Hospital Stay (HOSPITAL_COMMUNITY)
Admission: RE | Admit: 2021-09-01 | Discharge: 2021-09-04 | DRG: 657 | Disposition: A | Discharge status: Home / Self Care | Payer: Medicare HMO | Source: Ambulatory Visit | Attending: Urology | Admitting: Urology

## 2021-09-01 ENCOUNTER — Inpatient Hospital Stay (HOSPITAL_COMMUNITY): Payer: Medicare HMO | Admitting: Physician Assistant

## 2021-09-01 ENCOUNTER — Other Ambulatory Visit: Payer: Self-pay

## 2021-09-01 ENCOUNTER — Inpatient Hospital Stay (HOSPITAL_COMMUNITY): Payer: Medicare HMO | Admitting: Anesthesiology

## 2021-09-01 ENCOUNTER — Encounter (HOSPITAL_COMMUNITY)
Admission: RE | Disposition: A | Discharge status: Home / Self Care | Payer: Self-pay | Source: Ambulatory Visit | Attending: Urology

## 2021-09-01 ENCOUNTER — Encounter (HOSPITAL_COMMUNITY): Payer: Self-pay | Admitting: Urology

## 2021-09-01 DIAGNOSIS — Z885 Allergy status to narcotic agent status: Secondary | ICD-10-CM | POA: Diagnosis not present

## 2021-09-01 DIAGNOSIS — N132 Hydronephrosis with renal and ureteral calculous obstruction: Secondary | ICD-10-CM | POA: Diagnosis not present

## 2021-09-01 DIAGNOSIS — D0919 Carcinoma in situ of other urinary organs: Secondary | ICD-10-CM | POA: Diagnosis not present

## 2021-09-01 DIAGNOSIS — I2510 Atherosclerotic heart disease of native coronary artery without angina pectoris: Secondary | ICD-10-CM | POA: Diagnosis present

## 2021-09-01 DIAGNOSIS — M1990 Unspecified osteoarthritis, unspecified site: Secondary | ICD-10-CM | POA: Diagnosis present

## 2021-09-01 DIAGNOSIS — Z8619 Personal history of other infectious and parasitic diseases: Secondary | ICD-10-CM | POA: Diagnosis not present

## 2021-09-01 DIAGNOSIS — Z85828 Personal history of other malignant neoplasm of skin: Secondary | ICD-10-CM | POA: Diagnosis not present

## 2021-09-01 DIAGNOSIS — E785 Hyperlipidemia, unspecified: Secondary | ICD-10-CM | POA: Diagnosis present

## 2021-09-01 DIAGNOSIS — J439 Emphysema, unspecified: Secondary | ICD-10-CM | POA: Diagnosis not present

## 2021-09-01 DIAGNOSIS — N131 Hydronephrosis with ureteral stricture, not elsewhere classified: Secondary | ICD-10-CM | POA: Diagnosis not present

## 2021-09-01 DIAGNOSIS — R110 Nausea: Secondary | ICD-10-CM | POA: Diagnosis not present

## 2021-09-01 DIAGNOSIS — M797 Fibromyalgia: Secondary | ICD-10-CM | POA: Diagnosis present

## 2021-09-01 DIAGNOSIS — Z8551 Personal history of malignant neoplasm of bladder: Secondary | ICD-10-CM | POA: Diagnosis not present

## 2021-09-01 DIAGNOSIS — Z01818 Encounter for other preprocedural examination: Principal | ICD-10-CM

## 2021-09-01 DIAGNOSIS — C662 Malignant neoplasm of left ureter: Secondary | ICD-10-CM | POA: Diagnosis not present

## 2021-09-01 DIAGNOSIS — N261 Atrophy of kidney (terminal): Secondary | ICD-10-CM | POA: Diagnosis not present

## 2021-09-01 DIAGNOSIS — Z87891 Personal history of nicotine dependence: Secondary | ICD-10-CM | POA: Diagnosis not present

## 2021-09-01 DIAGNOSIS — K219 Gastro-esophageal reflux disease without esophagitis: Secondary | ICD-10-CM | POA: Diagnosis present

## 2021-09-01 DIAGNOSIS — Z9861 Coronary angioplasty status: Secondary | ICD-10-CM

## 2021-09-01 DIAGNOSIS — C669 Malignant neoplasm of unspecified ureter: Secondary | ICD-10-CM

## 2021-09-01 DIAGNOSIS — N118 Other chronic tubulo-interstitial nephritis: Secondary | ICD-10-CM | POA: Diagnosis not present

## 2021-09-01 DIAGNOSIS — N269 Renal sclerosis, unspecified: Secondary | ICD-10-CM | POA: Diagnosis not present

## 2021-09-01 DIAGNOSIS — I700 Atherosclerosis of aorta: Secondary | ICD-10-CM | POA: Diagnosis present

## 2021-09-01 DIAGNOSIS — Z886 Allergy status to analgesic agent status: Secondary | ICD-10-CM

## 2021-09-01 DIAGNOSIS — Z888 Allergy status to other drugs, medicaments and biological substances status: Secondary | ICD-10-CM | POA: Diagnosis not present

## 2021-09-01 DIAGNOSIS — N401 Enlarged prostate with lower urinary tract symptoms: Secondary | ICD-10-CM | POA: Diagnosis present

## 2021-09-01 DIAGNOSIS — N111 Chronic obstructive pyelonephritis: Secondary | ICD-10-CM | POA: Diagnosis not present

## 2021-09-01 DIAGNOSIS — E039 Hypothyroidism, unspecified: Secondary | ICD-10-CM | POA: Diagnosis present

## 2021-09-01 DIAGNOSIS — Z8554 Personal history of malignant neoplasm of ureter: Secondary | ICD-10-CM | POA: Diagnosis not present

## 2021-09-01 DIAGNOSIS — J449 Chronic obstructive pulmonary disease, unspecified: Secondary | ICD-10-CM | POA: Diagnosis not present

## 2021-09-01 DIAGNOSIS — I25119 Atherosclerotic heart disease of native coronary artery with unspecified angina pectoris: Secondary | ICD-10-CM

## 2021-09-01 DIAGNOSIS — I480 Paroxysmal atrial fibrillation: Secondary | ICD-10-CM | POA: Diagnosis present

## 2021-09-01 HISTORY — PX: ROBOT ASSITED LAPAROSCOPIC NEPHROURETERECTOMY: SHX6077

## 2021-09-01 LAB — HEMOGLOBIN AND HEMATOCRIT, BLOOD
HCT: 38.7 % — ABNORMAL LOW (ref 39.0–52.0)
Hemoglobin: 13.1 g/dL (ref 13.0–17.0)

## 2021-09-01 LAB — BASIC METABOLIC PANEL
Anion gap: 7 (ref 5–15)
BUN: 13 mg/dL (ref 8–23)
CO2: 26 mmol/L (ref 22–32)
Calcium: 9.4 mg/dL (ref 8.9–10.3)
Chloride: 105 mmol/L (ref 98–111)
Creatinine, Ser: 1.14 mg/dL (ref 0.61–1.24)
GFR, Estimated: >60 mL/min (ref >60)
Glucose, Bld: 96 mg/dL (ref 70–99)
Potassium: 3.9 mmol/L (ref 3.5–5.1)
Sodium: 138 mmol/L (ref 135–145)

## 2021-09-01 LAB — TYPE AND SCREEN
ABO/RH(D): O POS
Antibody Screen: NEGATIVE

## 2021-09-01 LAB — ABO/RH: ABO/RH(D): O POS

## 2021-09-01 SURGERY — NEPHROURETERECTOMY, ROBOT-ASSISTED, LAPAROSCOPIC
Anesthesia: General | Laterality: Left

## 2021-09-01 MED ORDER — OXYCODONE HCL 5 MG PO TABS: 5.0000 mg | ORAL_TABLET | Freq: Once | ORAL | Status: DC | PRN

## 2021-09-01 MED ORDER — ACETAMINOPHEN 10 MG/ML IV SOLN
INTRAVENOUS | Status: AC
  Filled 2021-09-01: qty 100

## 2021-09-01 MED ORDER — CEFAZOLIN SODIUM-DEXTROSE 1-4 GM/50ML-% IV SOLN
1.0000 g | Freq: Three times a day (TID) | INTRAVENOUS | Status: DC
  Administered 2021-09-01 – 2021-09-04 (×9): 1 g via INTRAVENOUS
  Filled 2021-09-01 (×10): qty 50

## 2021-09-01 MED ORDER — HYDROMORPHONE HCL 1 MG/ML IJ SOLN
INTRAMUSCULAR | Status: AC
  Filled 2021-09-01: qty 1

## 2021-09-01 MED ORDER — EPHEDRINE SULFATE-NACL 50-0.9 MG/10ML-% IV SOSY
PREFILLED_SYRINGE | INTRAVENOUS | Status: DC | PRN
  Administered 2021-09-01 (×2): 10 mg via INTRAVENOUS

## 2021-09-01 MED ORDER — ORAL CARE MOUTH RINSE: 15.0000 mL | Freq: Once | OROMUCOSAL | Status: AC

## 2021-09-01 MED ORDER — MORPHINE SULFATE (PF) 2 MG/ML IV SOLN
2.0000 mg | INTRAVENOUS | Status: DC | PRN
  Administered 2021-09-01 – 2021-09-02 (×3): 2 mg via INTRAVENOUS
  Filled 2021-09-01 (×3): qty 1

## 2021-09-01 MED ORDER — ROCURONIUM BROMIDE 10 MG/ML (PF) SYRINGE
PREFILLED_SYRINGE | INTRAVENOUS | Status: DC | PRN
  Administered 2021-09-01 (×2): 20 mg via INTRAVENOUS
  Administered 2021-09-01: 10 mg via INTRAVENOUS
  Administered 2021-09-01: 80 mg via INTRAVENOUS
  Administered 2021-09-01: 20 mg via INTRAVENOUS

## 2021-09-01 MED ORDER — ACETAMINOPHEN 10 MG/ML IV SOLN
1000.0000 mg | Freq: Once | INTRAVENOUS | Status: DC | PRN
  Administered 2021-09-01: 1000 mg via INTRAVENOUS

## 2021-09-01 MED ORDER — ACETAMINOPHEN 10 MG/ML IV SOLN
1000.0000 mg | Freq: Four times a day (QID) | INTRAVENOUS | Status: AC
  Administered 2021-09-01 – 2021-09-02 (×4): 1000 mg via INTRAVENOUS
  Filled 2021-09-01 (×4): qty 100

## 2021-09-01 MED ORDER — DEXAMETHASONE SODIUM PHOSPHATE 10 MG/ML IJ SOLN
INTRAMUSCULAR | Status: AC
  Filled 2021-09-01: qty 1

## 2021-09-01 MED ORDER — LACTATED RINGERS IR SOLN
Status: DC | PRN
Start: 2021-09-01 — End: 2021-09-01
  Administered 2021-09-01: 1

## 2021-09-01 MED ORDER — ONDANSETRON HCL 4 MG/2ML IJ SOLN
INTRAMUSCULAR | Status: AC
  Filled 2021-09-01: qty 2

## 2021-09-01 MED ORDER — PROMETHAZINE HCL 25 MG/ML IJ SOLN: 6.2500 mg | INTRAMUSCULAR | Status: DC | PRN

## 2021-09-01 MED ORDER — HYDROMORPHONE HCL 1 MG/ML IJ SOLN
0.2500 mg | INTRAMUSCULAR | Status: DC | PRN
  Administered 2021-09-01 (×2): 0.5 mg via INTRAVENOUS

## 2021-09-01 MED ORDER — ALBUMIN HUMAN 5 % IV SOLN: INTRAVENOUS | Status: DC | PRN

## 2021-09-01 MED ORDER — SODIUM CHLORIDE (PF) 0.9 % IJ SOLN
INTRAMUSCULAR | Status: AC
  Filled 2021-09-01: qty 20

## 2021-09-01 MED ORDER — BUPIVACAINE LIPOSOME 1.3 % IJ SUSP
INTRAMUSCULAR | Status: AC
  Filled 2021-09-01: qty 20

## 2021-09-01 MED ORDER — BUPIVACAINE LIPOSOME 1.3 % IJ SUSP
INTRAMUSCULAR | Status: DC | PRN
Start: 2021-09-01 — End: 2021-09-01
  Administered 2021-09-01: 20 mL

## 2021-09-01 MED ORDER — HYDROMORPHONE HCL 1 MG/ML IJ SOLN
INTRAMUSCULAR | Status: AC
Start: 2021-09-01 — End: 2021-09-02
  Filled 2021-09-01: qty 1

## 2021-09-01 MED ORDER — PROPOFOL 10 MG/ML IV BOLUS
INTRAVENOUS | Status: AC
  Filled 2021-09-01: qty 20

## 2021-09-01 MED ORDER — PHENYLEPHRINE HCL (PRESSORS) 10 MG/ML IV SOLN
INTRAVENOUS | Status: AC
  Filled 2021-09-01: qty 1

## 2021-09-01 MED ORDER — LACTATED RINGERS IV SOLN: INTRAVENOUS | Status: DC

## 2021-09-01 MED ORDER — SENNOSIDES-DOCUSATE SODIUM 8.6-50 MG PO TABS
2.0000 | ORAL_TABLET | Freq: Every day | ORAL | Status: DC
  Administered 2021-09-02 – 2021-09-03 (×3): 2 via ORAL
  Filled 2021-09-01 (×3): qty 2

## 2021-09-01 MED ORDER — SODIUM CHLORIDE (PF) 0.9 % IJ SOLN
INTRAMUSCULAR | Status: AC
  Filled 2021-09-01: qty 10

## 2021-09-01 MED ORDER — STERILE WATER FOR IRRIGATION IR SOLN
Status: DC | PRN
  Administered 2021-09-01: 1000 mL

## 2021-09-01 MED ORDER — SUFENTANIL CITRATE 50 MCG/ML IV SOLN
INTRAVENOUS | Status: DC | PRN
  Administered 2021-09-01 (×2): 10 ug via INTRAVENOUS
  Administered 2021-09-01: 20 ug via INTRAVENOUS
  Administered 2021-09-01: 10 ug via INTRAVENOUS

## 2021-09-01 MED ORDER — SUGAMMADEX SODIUM 200 MG/2ML IV SOLN
INTRAVENOUS | Status: DC | PRN
  Administered 2021-09-01: 200 mg via INTRAVENOUS

## 2021-09-01 MED ORDER — SODIUM CHLORIDE 0.9 % IR SOLN: 3000.0000 mL | Status: DC

## 2021-09-01 MED ORDER — LACTATED RINGERS IV SOLN: INTRAVENOUS | Status: DC | PRN

## 2021-09-01 MED ORDER — ALBUMIN HUMAN 5 % IV SOLN
INTRAVENOUS | Status: AC
  Filled 2021-09-01: qty 250

## 2021-09-01 MED ORDER — ONDANSETRON HCL 4 MG/2ML IJ SOLN
4.0000 mg | INTRAMUSCULAR | Status: DC | PRN
  Administered 2021-09-01 – 2021-09-03 (×6): 4 mg via INTRAVENOUS
  Filled 2021-09-01 (×7): qty 2

## 2021-09-01 MED ORDER — PROPOFOL 10 MG/ML IV BOLUS
INTRAVENOUS | Status: DC | PRN
  Administered 2021-09-01: 130 mg via INTRAVENOUS

## 2021-09-01 MED ORDER — CEFAZOLIN SODIUM-DEXTROSE 2-4 GM/100ML-% IV SOLN
INTRAVENOUS | Status: AC
  Filled 2021-09-01: qty 100

## 2021-09-01 MED ORDER — MIDAZOLAM HCL 2 MG/2ML IJ SOLN
INTRAMUSCULAR | Status: AC
  Filled 2021-09-01: qty 2

## 2021-09-01 MED ORDER — CHLORHEXIDINE GLUCONATE 0.12 % MT SOLN
15.0000 mL | Freq: Once | OROMUCOSAL | Status: AC
  Administered 2021-09-01: 15 mL via OROMUCOSAL

## 2021-09-01 MED ORDER — LIDOCAINE 2% (20 MG/ML) 5 ML SYRINGE
INTRAMUSCULAR | Status: DC | PRN
  Administered 2021-09-01: 1.5 mg/kg/h via INTRAVENOUS

## 2021-09-01 MED ORDER — DEXTROSE-NACL 5-0.45 % IV SOLN: INTRAVENOUS | Status: AC

## 2021-09-01 MED ORDER — MIDAZOLAM HCL 2 MG/2ML IJ SOLN
INTRAMUSCULAR | Status: DC | PRN
  Administered 2021-09-01: 2 mg via INTRAVENOUS

## 2021-09-01 MED ORDER — PHENYLEPHRINE HCL-NACL 20-0.9 MG/250ML-% IV SOLN
INTRAVENOUS | Status: DC | PRN
  Administered 2021-09-01: 50 ug/min via INTRAVENOUS

## 2021-09-01 MED ORDER — PHENYLEPHRINE 40 MCG/ML (10ML) SYRINGE FOR IV PUSH (FOR BLOOD PRESSURE SUPPORT)
PREFILLED_SYRINGE | INTRAVENOUS | Status: DC | PRN
  Administered 2021-09-01: 80 ug via INTRAVENOUS

## 2021-09-01 MED ORDER — DEXAMETHASONE SODIUM PHOSPHATE 10 MG/ML IJ SOLN
INTRAMUSCULAR | Status: DC | PRN
  Administered 2021-09-01: 10 mg via INTRAVENOUS

## 2021-09-01 MED ORDER — LIDOCAINE HCL 2 % IJ SOLN
INTRAMUSCULAR | Status: AC
  Filled 2021-09-01: qty 20

## 2021-09-01 MED ORDER — ONDANSETRON HCL 4 MG/2ML IJ SOLN
INTRAMUSCULAR | Status: DC | PRN
  Administered 2021-09-01: 4 mg via INTRAVENOUS

## 2021-09-01 MED ORDER — OXYCODONE HCL 5 MG PO TABS
5.0000 mg | ORAL_TABLET | ORAL | Status: DC | PRN
  Administered 2021-09-01: 5 mg via ORAL
  Administered 2021-09-02 (×2): 10 mg via ORAL
  Administered 2021-09-02: 5 mg via ORAL
  Administered 2021-09-02: 10 mg via ORAL
  Administered 2021-09-02: 5 mg via ORAL
  Administered 2021-09-03: 10 mg via ORAL
  Filled 2021-09-01 (×4): qty 2
  Filled 2021-09-01: qty 1
  Filled 2021-09-01: qty 2
  Filled 2021-09-01: qty 1
  Filled 2021-09-01: qty 2
  Filled 2021-09-01: qty 1

## 2021-09-01 MED ORDER — ROCURONIUM BROMIDE 10 MG/ML (PF) SYRINGE
PREFILLED_SYRINGE | INTRAVENOUS | Status: AC
  Filled 2021-09-01: qty 10

## 2021-09-01 MED ORDER — SODIUM CHLORIDE (PF) 0.9 % IJ SOLN
INTRAMUSCULAR | Status: DC | PRN
  Administered 2021-09-01: 20 mL

## 2021-09-01 MED ORDER — PHENYLEPHRINE 40 MCG/ML (10ML) SYRINGE FOR IV PUSH (FOR BLOOD PRESSURE SUPPORT)
PREFILLED_SYRINGE | INTRAVENOUS | Status: AC
  Filled 2021-09-01: qty 10

## 2021-09-01 MED ORDER — CEFAZOLIN SODIUM-DEXTROSE 2-4 GM/100ML-% IV SOLN
2.0000 g | Freq: Once | INTRAVENOUS | Status: AC
  Administered 2021-09-01 (×2): 2 g via INTRAVENOUS
  Filled 2021-09-01: qty 100

## 2021-09-01 MED ORDER — HYDROMORPHONE HCL 1 MG/ML IJ SOLN
0.2500 mg | INTRAMUSCULAR | Status: DC | PRN
  Administered 2021-09-01 (×4): 0.5 mg via INTRAVENOUS

## 2021-09-01 MED ORDER — PRAVASTATIN SODIUM 40 MG PO TABS
40.0000 mg | ORAL_TABLET | Freq: Every day | ORAL | Status: DC
  Administered 2021-09-02 – 2021-09-03 (×3): 40 mg via ORAL
  Filled 2021-09-01 (×3): qty 1

## 2021-09-01 MED ORDER — LIDOCAINE 2% (20 MG/ML) 5 ML SYRINGE
INTRAMUSCULAR | Status: DC | PRN
  Administered 2021-09-01: 60 mg via INTRAVENOUS

## 2021-09-01 MED ORDER — GABAPENTIN 400 MG PO CAPS
800.0000 mg | ORAL_CAPSULE | Freq: Three times a day (TID) | ORAL | Status: DC
  Administered 2021-09-01 – 2021-09-04 (×8): 800 mg via ORAL
  Filled 2021-09-01 (×9): qty 2

## 2021-09-01 MED ORDER — OXYCODONE HCL 5 MG/5ML PO SOLN: 5.0000 mg | Freq: Once | ORAL | Status: DC | PRN

## 2021-09-01 MED ORDER — SUFENTANIL CITRATE 50 MCG/ML IV SOLN
INTRAVENOUS | Status: AC
  Filled 2021-09-01: qty 1

## 2021-09-01 MED ORDER — AMISULPRIDE (ANTIEMETIC) 5 MG/2ML IV SOLN: 10.0000 mg | Freq: Once | INTRAVENOUS | Status: DC | PRN

## 2021-09-01 MED ORDER — SODIUM CHLORIDE 0.9 % IR SOLN
Status: DC | PRN
  Administered 2021-09-01 (×3): 3000 mL via INTRAVESICAL

## 2021-09-01 MED ORDER — HEMOSTATIC AGENTS (NO CHARGE) OPTIME
TOPICAL | Status: DC | PRN
  Administered 2021-09-01: 1 via TOPICAL

## 2021-09-01 MED ORDER — LEVOTHYROXINE SODIUM 50 MCG PO TABS
50.0000 ug | ORAL_TABLET | Freq: Every day | ORAL | Status: DC
  Administered 2021-09-02 – 2021-09-04 (×3): 50 ug via ORAL
  Filled 2021-09-01 (×3): qty 1

## 2021-09-01 MED ORDER — TAMSULOSIN HCL 0.4 MG PO CAPS
0.4000 mg | ORAL_CAPSULE | Freq: Every day | ORAL | Status: DC
  Administered 2021-09-02 – 2021-09-03 (×2): 0.4 mg via ORAL
  Filled 2021-09-01 (×2): qty 1

## 2021-09-01 SURGICAL SUPPLY — 74 items
BAG COUNTER SPONGE SURGICOUNT (BAG) IMPLANT
BAG LAPAROSCOPIC 12 15 PORT 16 (BASKET) ×1 IMPLANT
BAG RETRIEVAL 12/15 (BASKET) ×2
CATH FOLEY 3WAY  5CC 18FR (CATHETERS) ×2
CATH FOLEY 3WAY 5CC 18FR (CATHETERS) ×1 IMPLANT
CHLORAPREP W/TINT 26 (MISCELLANEOUS) ×2 IMPLANT
CLIP LIGATING HEM O LOK PURPLE (MISCELLANEOUS) ×2 IMPLANT
CLIP LIGATING HEMO LOK XL GOLD (MISCELLANEOUS) ×2 IMPLANT
CLIP LIGATING HEMO O LOK GREEN (MISCELLANEOUS) ×2 IMPLANT
COVER SURGICAL LIGHT HANDLE (MISCELLANEOUS) ×2 IMPLANT
COVER TIP SHEARS 8 DVNC (MISCELLANEOUS) ×1 IMPLANT
COVER TIP SHEARS 8MM DA VINCI (MISCELLANEOUS) ×2
CUTTER ECHEON FLEX ENDO 45 340 (ENDOMECHANICALS) IMPLANT
DERMABOND ADVANCED (GAUZE/BANDAGES/DRESSINGS) ×1
DERMABOND ADVANCED .7 DNX12 (GAUZE/BANDAGES/DRESSINGS) ×2 IMPLANT
DRAIN CHANNEL 15F RND FF 3/16 (WOUND CARE) ×2 IMPLANT
DRAPE ARM DVNC X/XI (DISPOSABLE) ×4 IMPLANT
DRAPE COLUMN DVNC XI (DISPOSABLE) ×1 IMPLANT
DRAPE DA VINCI XI ARM (DISPOSABLE) ×8
DRAPE DA VINCI XI COLUMN (DISPOSABLE) ×2
DRAPE INCISE IOBAN 66X45 STRL (DRAPES) ×2 IMPLANT
DRAPE LAPAROSCOPIC ABDOMINAL (DRAPES) ×2 IMPLANT
DRAPE SHEET LG 3/4 BI-LAMINATE (DRAPES) ×2 IMPLANT
DRSG TEGADERM 4X4.75 (GAUZE/BANDAGES/DRESSINGS) ×2 IMPLANT
ELECT BIPOLAR KNIFE NDL PTD 7M (ELECTRODE) ×2 IMPLANT
ELECT KNIFE MONO 22-24F 12/30D (ELECTROSURGICAL) ×2
ELECT PENCIL ROCKER SW 15FT (MISCELLANEOUS) ×1 IMPLANT
ELECT REM PT RETURN 15FT ADLT (MISCELLANEOUS) ×2 IMPLANT
ELECTRODE KNF MON 22-24F 12/30 (ELECTROSURGICAL) IMPLANT
EVACUATOR SILICONE 100CC (DRAIN) ×2 IMPLANT
GAUZE SPONGE 2X2 8PLY STRL LF (GAUZE/BANDAGES/DRESSINGS) IMPLANT
GLOVE SURG ENC MOIS LTX SZ6.5 (GLOVE) ×2 IMPLANT
GLOVE SURG ENC TEXT LTX SZ7.5 (GLOVE) ×4 IMPLANT
GOWN STRL REUS W/ TWL LRG LVL3 (GOWN DISPOSABLE) ×3 IMPLANT
GOWN STRL REUS W/TWL LRG LVL3 (GOWN DISPOSABLE) ×6
GUIDEWIRE ZIPWRE .038 STRAIGHT (WIRE) ×2 IMPLANT
HEMOSTAT SURGICEL 4X8 (HEMOSTASIS) ×1 IMPLANT
IRRIG SUCT STRYKERFLOW 2 WTIP (MISCELLANEOUS)
IRRIGATION SUCT STRKRFLW 2 WTP (MISCELLANEOUS) IMPLANT
KIT BASIN OR (CUSTOM PROCEDURE TRAY) ×2 IMPLANT
KIT TURNOVER KIT A (KITS) IMPLANT
NDL INSUFFLATION 14GA 120MM (NEEDLE) ×1 IMPLANT
NEEDLE INSUFFLATION 14GA 120MM (NEEDLE) ×2 IMPLANT
NS IRRIG 1000ML POUR BTL (IV SOLUTION) ×2 IMPLANT
PROTECTOR NERVE ULNAR (MISCELLANEOUS) ×4 IMPLANT
RELOAD STAPLE 45 2.6 WHT THIN (STAPLE) IMPLANT
SCISSORS LAP 5X45 EPIX DISP (ENDOMECHANICALS) ×2 IMPLANT
SEAL CANN UNIV 5-8 DVNC XI (MISCELLANEOUS) ×4 IMPLANT
SEAL XI 5MM-8MM UNIVERSAL (MISCELLANEOUS) ×8
SET IRRIG Y TYPE TUR BLADDER L (SET/KITS/TRAYS/PACK) ×2 IMPLANT
SET TUBE SMOKE EVAC HIGH FLOW (TUBING) ×2 IMPLANT
SOLUTION ELECTROLUBE (MISCELLANEOUS) ×2 IMPLANT
SPIKE FLUID TRANSFER (MISCELLANEOUS) ×2 IMPLANT
SPONGE GAUZE 2X2 STER 10/PKG (GAUZE/BANDAGES/DRESSINGS) ×1
STAPLE RELOAD 45 WHT (STAPLE) ×3 IMPLANT
STAPLE RELOAD 45MM WHITE (STAPLE) ×6
STENT URET 6FRX24 CONTOUR (STENTS) ×1 IMPLANT
SUT ETHILON 3 0 PS 1 (SUTURE) ×2 IMPLANT
SUT MNCRL AB 4-0 PS2 18 (SUTURE) ×4 IMPLANT
SUT PDS AB 1 CTX 36 (SUTURE) ×4 IMPLANT
SUT V-LOC BARB 180 2/0GR6 GS22 (SUTURE) ×2
SUT VIC AB 3-0 SH 27 (SUTURE) ×2
SUT VIC AB 3-0 SH 27X BRD (SUTURE) IMPLANT
SUT VICRYL 0 UR6 27IN ABS (SUTURE) ×2 IMPLANT
SUT VLOC BARB 180 ABS3/0GR12 (SUTURE)
SUTURE V-LC BRB 180 2/0GR6GS22 (SUTURE) IMPLANT
SUTURE VLOC BRB 180 ABS3/0GR12 (SUTURE) IMPLANT
TOWEL OR NON WOVEN STRL DISP B (DISPOSABLE) ×2 IMPLANT
TRAY FOLEY MTR SLVR 16FR STAT (SET/KITS/TRAYS/PACK) ×2 IMPLANT
TRAY LAPAROSCOPIC (CUSTOM PROCEDURE TRAY) ×2 IMPLANT
TROCAR BLADELESS OPT 5 100 (ENDOMECHANICALS) ×2 IMPLANT
TROCAR UNIVERSAL OPT 12M 100M (ENDOMECHANICALS) IMPLANT
TROCAR XCEL 12X100 BLDLESS (ENDOMECHANICALS) ×2 IMPLANT
WATER STERILE IRR 1000ML POUR (IV SOLUTION) ×4 IMPLANT

## 2021-09-01 NOTE — Anesthesia Procedure Notes (Signed)
Procedure Name: Intubation ?Date/Time: 09/01/2021 7:29 AM ?Performed by: Sharlette Dense, CRNA ?Pre-anesthesia Checklist: Patient identified, Emergency Drugs available, Suction available and Patient being monitored ?Patient Re-evaluated:Patient Re-evaluated prior to induction ?Oxygen Delivery Method: Circle system utilized ?Preoxygenation: Pre-oxygenation with 100% oxygen ?Induction Type: IV induction ?Ventilation: Mask ventilation without difficulty and Oral airway inserted - appropriate to patient size ?Laryngoscope Size: Glidescope and 3 ?Grade View: Grade I ?Tube type: Parker flex tip ?Tube size: 7.5 mm ?Number of attempts: 1 ?Airway Equipment and Method: Rigid stylet and Video-laryngoscopy ?Placement Confirmation: ETT inserted through vocal cords under direct vision, positive ETCO2 and breath sounds checked- equal and bilateral ?Secured at: 22 cm ?Tube secured with: Tape ?Dental Injury: Teeth and Oropharynx as per pre-operative assessment  ?Difficulty Due To: Difficulty was anticipated, Difficult Airway- due to anterior larynx and Difficult Airway- due to limited oral opening ?Future Recommendations: Recommend- induction with short-acting agent, and alternative techniques readily available ? ? ? ? ?

## 2021-09-01 NOTE — Op Note (Signed)
Operative Note ? ?Preoperative diagnosis:  ?1.  Atrophic left kidney ?2.  History low grade Ta UCC of the left distal ureter ? ?Postoperative diagnosis: ?1.  1.  Atrophic left kidney ?2.  History low grade Ta UCC of the left distal ureter ? ?Procedure(s): ?1.  Cystoscopy with left ureteral stent placement and TUR of the left ureteral orifice ?2.  Robot-assisted laparoscopic left nephro ureterectomy ? ?Surgeon: Ellison Hughs, MD ? ?Assistants: Reola Mosher, MD PGY 4 ? ?Anesthesia:  General ? ?Complications:  None ? ?EBL: 150 mL ? ?Specimens: ?1.  Left kidney and ureter ? ?Drains/Catheters: ?1.  Left lower quadrant JP drain ?2.  20 French three-way Foley catheter with 10 mL of sterile water in the balloon ? ?Intraoperative findings:   ?Following left ureteral stent placement, purulent, port wine appearing urine was seen draining around the stent ?The left bladder hiatus was hemostatic following closure ? ?Indication:  Marvin Gill is a 71 y.o. 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 He developed a stenotic left ureteral orifice resulting in chronic left hydronephrosis. He also has a history of kidney stones, ED, chronic orchalgia and BPH with lower urinary tract symptoms.  Surveillance CT scan from February revealed significant worsening in his left-sided hydronephrosis with marked thinning of his left renal cortex.  He is here today for left nephro ureterectomy.  He has been consented for the above procedures, voices understanding and wishes to proceed. ? ?Description of procedure: ? ?After informed consent was obtained, the patient was brought to the operating room and general endotracheal anesthesia was administered.  The patient was placed in the dorsolithotomy position and prepped and draped in usual sterile fashion.  A timeout was then performed.  A 23 French rigid cystoscope was then inserted into the bladder.   A complete bladder survey revealed no intravesical abnormalities.  His previously resected left ureteral orifice was identified.  A Glidewire was then advanced up the left ureter.  A 6 French, 24 cm JJ stent was then advanced over the Glidewire and into position within the left collecting system.  Purulent, port red appearing urine was seen draining around the catheter following ureteral stent placement.  The rigid cystoscope was then exchanged for a 26 French resectoscope with a needle [employer] working Actuary.  The left ureteral orifice was then circumferentially excised down to the perivesical fat.  A 20 French three-way Foley catheter was then placed and the balloon was inflated with 10 mL of sterile water.  The catheter was then placed to gravity drainage. ? ?The patient was then placed in the right lateral decubitus position and prepped and draped in usual sterile fashion.  A timeout was performed.  An 8 mm incision was then made lateral to the left rectus muscle at the level of the left 12th rib.  Abdominal access was obtained via a Veress needle.  The abdominal cavity was then insufflated up to 15 mmHg.  An 8 mm port was then introduced into the abdominal cavity.  Inspection of the port entry site by the robotic camera revealed no adjacent organ injury.  We then placed 3 additional 8 mm robotic ports to triangulate the left renal hilum.  A 12 mm assistant port was then placed between the carmera port and 3rd robotic arm. ? ?The white line of Toldt along the descending colon was incised sharply and the colon, along with its mesocolonic fat, was reflected  medially until the aorta was identified.  We then made a small window adjacent to the lower pole of the left kidney, identifying the left psoas muscle, left ureter and left gonadal vein.  The left ureter and gonadal vein were then reflected anteriorly allowing [place] to then incised the perihilar attachments using electrocautery.  We encountered a small  lumbar vein adjacent to the insertion of the left gonadal vein into the left renal vein.  This lumbar vein was ligated with hemo-lock clips in 2 places and incised sharply.  This provided [place] excellent exposure to the left renal hilum.   ? ?A 45 mm endovascular stapler was then used to ligate the left renal artery and then the left renal vein, achieving excellent hemostasis.  The remaining peri-renal attachments were then excised using a combination of blunt dissection and electrocautery.  The left adrenal gland was spared.  The left ureter was then traced down to the pelvis into the left side of the bladder hiatus.  Once all of the periureteral attachments were dissected free, the left ureter was excised from the bladder hiatus.  A 2-0 V-Loc suture was then used to reapproximate the bladder defect.  A total of 300 mL of saline was irrigated through the Foley catheter in the bladder closure was found to be watertight. ? ?Once the kidney was freely mobile, it was placed in Endo Catch bag to be be retrieved at the conclusion of the case.  A JP drain was then placed through the lateralmost robotic incision.  The robot was then de-docked.  A left lower quadrant Gibson incision was then made and the mass was removed within the Endo Catch bag.  The fascia within the midline assistant port was then closed using an interrupted 0 Vicryl suture.  The fascia of the internal and external oblique was then closed using a 0 PDS suture in a running fashion.  The subcutaneous tissue within the [facility] incision was then closed using a running 0 Vicryl suture.  All skin incisions were then closed using 4-0 Monocryl and then dressed with Dermabond.  The patient tolerated the procedure well and was transferred to the postanesthesia in stable condition.   ? ? ?Plan: Monitor on the floor overnight ? ?

## 2021-09-01 NOTE — Transfer of Care (Signed)
Immediate Anesthesia Transfer of Care Note ? ?Patient: Marvin Gill ? ?Procedure(s) Performed: XI ROBOT ASSITED LAPAROSCOPIC NEPHROURETERECTOMY/ TRANSURETHRAL RESECTION OF LEFT URETERAL ORIFICE (Left) ? ?Patient Location: PACU ? ?Anesthesia Type:General ? ?Level of Consciousness: awake and oriented ? ?Airway & Oxygen Therapy: Patient Spontanous Breathing and Patient connected to face mask oxygen ? ?Post-op Assessment: Report given to RN and Post -op Vital signs reviewed and stable ? ?Post vital signs: Reviewed and stable ? ?Last Vitals:  ?Vitals Value Taken Time  ?BP 156/87 09/01/21 1204  ?Temp    ?Pulse 94 09/01/21 1206  ?Resp 13 09/01/21 1206  ?SpO2 100 % 09/01/21 1206  ?Vitals shown include unvalidated device data. ? ?Last Pain:  ?Vitals:  ? 09/01/21 0544  ?TempSrc:   ?PainSc: 0-No pain  ?   ? ?  ? ?Complications: No notable events documented. ?

## 2021-09-01 NOTE — Anesthesia Postprocedure Evaluation (Signed)
Anesthesia Post Note ? ?Patient: Marvin Gill ? ?Procedure(s) Performed: XI ROBOT ASSITED LAPAROSCOPIC NEPHROURETERECTOMY/ TRANSURETHRAL RESECTION OF LEFT URETERAL ORIFICE (Left) ? ?  ? ?Patient location during evaluation: PACU ?Anesthesia Type: General ?Level of consciousness: awake and alert ?Pain management: pain level controlled ?Vital Signs Assessment: post-procedure vital signs reviewed and stable ?Respiratory status: spontaneous breathing, nonlabored ventilation and respiratory function stable ?Cardiovascular status: blood pressure returned to baseline and stable ?Postop Assessment: no apparent nausea or vomiting ?Anesthetic complications: no ? ? ?No notable events documented. ? ?Last Vitals:  ?Vitals:  ? 09/01/21 1300 09/01/21 1315  ?BP: (!) 142/90 (!) 143/90  ?Pulse: 90 87  ?Resp: 11 10  ?Temp:    ?SpO2: 95% 95%  ?  ?Last Pain:  ?Vitals:  ? 09/01/21 1315  ?TempSrc:   ?PainSc: Asleep  ? ? ?  ?  ?  ?  ?  ?  ? ?Lynda Rainwater ? ? ? ? ?

## 2021-09-02 ENCOUNTER — Encounter (HOSPITAL_COMMUNITY): Payer: Self-pay | Admitting: Urology

## 2021-09-02 LAB — CBC
HCT: 37.9 % — ABNORMAL LOW (ref 39.0–52.0)
Hemoglobin: 12.7 g/dL — ABNORMAL LOW (ref 13.0–17.0)
MCH: 29.1 pg (ref 26.0–34.0)
MCHC: 33.5 g/dL (ref 30.0–36.0)
MCV: 86.9 fL (ref 80.0–100.0)
Platelets: 226 10*3/uL (ref 150–400)
RBC: 4.36 MIL/uL (ref 4.22–5.81)
RDW: 14.2 % (ref 11.5–15.5)
WBC: 14.1 10*3/uL — ABNORMAL HIGH (ref 4.0–10.5)
nRBC: 0 % (ref 0.0–0.2)

## 2021-09-02 LAB — BASIC METABOLIC PANEL
Anion gap: 7 (ref 5–15)
BUN: 17 mg/dL (ref 8–23)
CO2: 26 mmol/L (ref 22–32)
Calcium: 9 mg/dL (ref 8.9–10.3)
Chloride: 103 mmol/L (ref 98–111)
Creatinine, Ser: 1.15 mg/dL (ref 0.61–1.24)
GFR, Estimated: >60 mL/min (ref >60)
Glucose, Bld: 127 mg/dL — ABNORMAL HIGH (ref 70–99)
Potassium: 4.2 mmol/L (ref 3.5–5.1)
Sodium: 136 mmol/L (ref 135–145)

## 2021-09-02 LAB — TROPONIN I (HIGH SENSITIVITY)
Troponin I (High Sensitivity): 4 ng/L (ref <18)
Troponin I (High Sensitivity): 4 ng/L (ref <18)

## 2021-09-02 MED ORDER — ACETAMINOPHEN 10 MG/ML IV SOLN
1000.0000 mg | Freq: Four times a day (QID) | INTRAVENOUS | Status: AC
  Administered 2021-09-02 – 2021-09-03 (×4): 1000 mg via INTRAVENOUS
  Filled 2021-09-02 (×4): qty 100

## 2021-09-02 MED ORDER — ALPRAZOLAM 1 MG PO TABS: 1.0000 mg | ORAL_TABLET | Freq: Every evening | ORAL | Status: DC | PRN

## 2021-09-02 MED ORDER — DEXTROSE-NACL 5-0.45 % IV SOLN: INTRAVENOUS | Status: AC

## 2021-09-02 MED ORDER — CALCIUM CARBONATE ANTACID 500 MG PO CHEW
1.0000 | CHEWABLE_TABLET | Freq: Four times a day (QID) | ORAL | Status: DC | PRN
  Administered 2021-09-02 – 2021-09-03 (×5): 200 mg via ORAL
  Filled 2021-09-02 (×6): qty 1

## 2021-09-02 MED ORDER — LIDOCAINE 5 % EX PTCH
1.0000 | MEDICATED_PATCH | CUTANEOUS | Status: DC
  Administered 2021-09-02: 1 via TRANSDERMAL
  Filled 2021-09-02 (×3): qty 1

## 2021-09-02 MED ORDER — HYDROMORPHONE HCL 1 MG/ML IJ SOLN
0.5000 mg | INTRAMUSCULAR | Status: DC | PRN
  Administered 2021-09-02 – 2021-09-03 (×2): 0.5 mg via INTRAVENOUS
  Filled 2021-09-02 (×2): qty 0.5

## 2021-09-02 MED ORDER — CHLORHEXIDINE GLUCONATE CLOTH 2 % EX PADS
6.0000 | MEDICATED_PAD | Freq: Every day | CUTANEOUS | Status: DC
  Administered 2021-09-02: 6 via TOPICAL

## 2021-09-02 MED ORDER — PANTOPRAZOLE SODIUM 40 MG PO TBEC
80.0000 mg | DELAYED_RELEASE_TABLET | Freq: Every day | ORAL | Status: DC
  Administered 2021-09-02 – 2021-09-04 (×3): 80 mg via ORAL
  Filled 2021-09-02 (×3): qty 2

## 2021-09-02 MED ORDER — ALPRAZOLAM 0.5 MG PO TABS
0.5000 mg | ORAL_TABLET | Freq: Two times a day (BID) | ORAL | Status: DC | PRN
  Administered 2021-09-02: 1 mg via ORAL
  Administered 2021-09-02: 0.5 mg via ORAL
  Administered 2021-09-03: 1 mg via ORAL
  Filled 2021-09-02 (×2): qty 2
  Filled 2021-09-02: qty 1

## 2021-09-02 NOTE — Progress Notes (Signed)
?  Transition of Care (TOC) Screening Note ? ? ?Patient Details  ?Name: Marvin Gill ?Date of Birth: 1951/05/05 ? ? ?Transition of Care [facility]) CM/SW Contact:    ?Dessa Phi, RN ?Phone Number: ?09/02/2021, 10:45 AM ? ? ? ?Transition of Care Department [facility]) has reviewed patient and no TOC needs have been identified at this time. We will continue to monitor patient advancement through interdisciplinary progression rounds. If new patient transition needs arise, please place a TOC consult. ?  ?

## 2021-09-02 NOTE — Progress Notes (Signed)
Patient refuses mobility at this time.  States he wants to rest, has not had any sleep.  Patient ambulated earlier this morning and tolerated well, Also sat up in chair for approximately 2 hours.  Belching, but no flatulence at this time.  Patient reminded of importance of mobility and agrees to ambulate at later time. ?

## 2021-09-02 NOTE — Progress Notes (Signed)
RN went to pt's room right after am labs drawn. Pt reports pain left breast area sharp, intermittent past 30 minutes and states it is different then pain he has been feeling in abdomen and surgical area all night. Pt also had feeling of heartburn in throat earlier in night but states this is different pain. RN pressed on area of chest which reproduced same pain same sharp pain. Also asked pt to take a deep breath and pt reports the same sharp pain. However pt states the pain was occurring even without deep breath, movement or pressing on it.  ?On call provider paged and notified. EKG obtained and pain resolved. Troponin lab ordered per MD. Pt denies pain left chest the rest of the night.  ?

## 2021-09-02 NOTE — Progress Notes (Signed)
Urology Progress Note  ? ?1 Day Post-Op from left robotic Nephro-U.  ? ?Subjective: ?NAEON.  ?Complaining of primarily pain this AM ?Some nausea overnight ?Has tolerated some clears ?Has not ambulated ?No gas/BM ? ?Objective: ?Vital signs in last 24 hours: ?Temp:  [97 ?F (36.1 ?C)-98.4 ?F (36.9 ?C)] 98.3 ?F (36.8 ?C) (04/05 0328) ?Pulse Rate:  [74-97] 75 (04/05 0328) ?Resp:  [10-20] 20 (04/05 0328) ?BP: (131-156)/(80-99) 132/80 (04/05 0328) ?SpO2:  [94 %-100 %] 98 % (04/05 0328) ?Weight:  [97.3 kg] 97.3 kg (04/04 1814) ? ?Intake/Output from previous day: ?04/04 0701 - 04/05 0700 ?In: 5577.7 [P.O.:480; I.V.:4397.6; IV Piggyback:700.1] ?Out: 2780 [Urine:2575; Drains:55; Blood:150] ?Intake/Output this shift: ?Total I/O ?In: -  ?Out: 20 [Drains:20] ? ?Physical Exam:  ?General: Alert and oriented ?CV: Regular rate ?Lungs: No increased work of breathing ?Abdomen: Soft, appropriately tender. Incisions c/d/i. JP SS ?GU: Foley in place draining clear yellow urine  ?Ext: NT, No erythema ? ?Lab Results: ?Recent Labs  ?  09/01/21 ?1305 09/02/21 ?0430  ?HGB 13.1 12.7*  ?HCT 38.7* 37.9*  ? ?Recent Labs  ?  09/01/21 ?5053 09/02/21 ?0430  ?NA 138 136  ?K 3.9 4.2  ?CL 105 103  ?CO2 26 26  ?GLUCOSE 96 127*  ?BUN 13 17  ?CREATININE 1.14 1.15  ?CALCIUM 9.4 9.0  ? ? ?Studies/Results: ?No results found. ? ?Assessment/Plan: ? ?71 y.o. male s/p left robotic Nephroureterectomy.  Overall doing well post-op.  ? ?- Continue CLD ?- Continue D5 1/2NS at 50 mL until taking in more PO ?- AROBF ?- OOB/ambulation ?- Switch from morphine to dilaudid for nausea, add lidocaine patches for pain ?- Continue Foley catheter ?- DC JP drain prior to discharge ? ?Dispo: DC later today versus tomorrow ? ? LOS: 1 day  ? ?

## 2021-09-03 LAB — BASIC METABOLIC PANEL
Anion gap: 5 (ref 5–15)
BUN: 15 mg/dL (ref 8–23)
CO2: 30 mmol/L (ref 22–32)
Calcium: 9.2 mg/dL (ref 8.9–10.3)
Chloride: 101 mmol/L (ref 98–111)
Creatinine, Ser: 1.18 mg/dL (ref 0.61–1.24)
GFR, Estimated: >60 mL/min (ref >60)
Glucose, Bld: 95 mg/dL (ref 70–99)
Potassium: 4.5 mmol/L (ref 3.5–5.1)
Sodium: 136 mmol/L (ref 135–145)

## 2021-09-03 LAB — CBC
HCT: 38.1 % — ABNORMAL LOW (ref 39.0–52.0)
Hemoglobin: 12.5 g/dL — ABNORMAL LOW (ref 13.0–17.0)
MCH: 29.3 pg (ref 26.0–34.0)
MCHC: 32.8 g/dL (ref 30.0–36.0)
MCV: 89.4 fL (ref 80.0–100.0)
Platelets: 212 10*3/uL (ref 150–400)
RBC: 4.26 MIL/uL (ref 4.22–5.81)
RDW: 14.8 % (ref 11.5–15.5)
WBC: 10.6 10*3/uL — ABNORMAL HIGH (ref 4.0–10.5)
nRBC: 0 % (ref 0.0–0.2)

## 2021-09-03 LAB — CREATININE, FLUID (PLEURAL, PERITONEAL, JP DRAINAGE): Creat, Fluid: 1 mg/dL

## 2021-09-03 MED ORDER — CHLORHEXIDINE GLUCONATE CLOTH 2 % EX PADS
6.0000 | MEDICATED_PAD | Freq: Every day | CUTANEOUS | Status: DC
  Administered 2021-09-03 – 2021-09-04 (×2): 6 via TOPICAL

## 2021-09-03 MED ORDER — HEPARIN SODIUM (PORCINE) 5000 UNIT/ML IJ SOLN
5000.0000 [IU] | Freq: Three times a day (TID) | INTRAMUSCULAR | Status: DC
  Administered 2021-09-03 – 2021-09-04 (×3): 5000 [IU] via SUBCUTANEOUS
  Filled 2021-09-03 (×3): qty 1

## 2021-09-03 MED ORDER — ACETAMINOPHEN 325 MG PO TABS
650.0000 mg | ORAL_TABLET | Freq: Four times a day (QID) | ORAL | Status: DC | PRN
  Administered 2021-09-03 – 2021-09-04 (×3): 650 mg via ORAL
  Filled 2021-09-03 (×3): qty 2

## 2021-09-03 MED ORDER — PHENAZOPYRIDINE HCL 200 MG PO TABS
200.0000 mg | ORAL_TABLET | Freq: Three times a day (TID) | ORAL | Status: DC
  Administered 2021-09-03 – 2021-09-04 (×3): 200 mg via ORAL
  Filled 2021-09-03 (×5): qty 1

## 2021-09-03 NOTE — Progress Notes (Signed)
?   09/03/21 1646  ?Pain Assessment  ?Pain Scale 0-10  ?Pain Score 8  ?Pain Type Acute pain  ?Pain Location Abdomen  ?Pain Orientation Lower  ?Pain Descriptors / Indicators Burning;Aching;Discomfort  ?Pain Frequency Constant  ?Pain Onset On-going  ?Patients Stated Pain Goal 2  ?Pain Intervention(s) Medication (See eMAR);Rest;Relaxation;Emotional support;MD notified (Comment) ?(Dr. Leeroy Bock, foley catheter replaced)  ?Provider Notification  ?Provider Name/Title Dr. Leeroy Bock  ?Date Provider Notified 09/03/21  ?Time Provider Notified 1645  ?Method of Notification Page  ?Notification Reason Change in status  ?Provider response See new orders  ?Date of Provider Response 09/03/21  ?Time of Provider Response 1646  ? ?Pt c/o burning with urination, frequent urination, feels he is unable to completely empty bladder. Dr. Leeroy Bock at bedside, aware. New orders received for Pyridium and bladder scan. Bladder showed > 700 ml's in bladder. Dr. Leeroy Bock made aware. New order to reinsert foley catheter, 16 french. Foley placed. Pt reports relief of symptoms. Will continue to monitor. ?

## 2021-09-03 NOTE — Progress Notes (Signed)
?   09/03/21 1800  ?Mobility  ?Activity Refused mobility  ? ?Pt stated "I'm too tired". Reeducated on the importance of ambulation. Verbalized understanding. Will continue to encourage ambulation. ?

## 2021-09-03 NOTE — Progress Notes (Signed)
Urology Progress Note  ? ?2 Days Post-Op from left robotic Nephro-U.  ? ?Subjective: ?NAEON.  ?Feeling subjectively better ?Pain controlled improved ?Some intermittent persistent nausea ?Tolerating some clears ?Ambulating ?Passing some gas, no BM ? ?Objective: ?Vital signs in last 24 hours: ?Temp:  [98.1 ?F (36.7 ?C)-98.9 ?F (37.2 ?C)] 98.7 ?F (37.1 ?C) (04/06 1031) ?Pulse Rate:  [59-71] 64 (04/06 0603) ?Resp:  [18-20] 20 (04/06 0603) ?BP: (126-142)/(70-87) 126/77 (04/06 0603) ?SpO2:  [97 %-100 %] 97 % (04/06 0603) ? ?Intake/Output from previous day: ?04/05 0701 - 04/06 0700 ?In: 1414.2 [P.O.:720; I.V.:394.2; IV Piggyback:300] ?Out: 5945 [Urine:4150; Emesis/NG output:30; Drains:33] ?Intake/Output this shift: ?No intake/output data recorded. ? ?Physical Exam:  ?General: Alert and oriented ?CV: Regular rate ?Lungs: No increased work of breathing ?Abdomen: Soft, appropriately tender. Incisions c/d/i. JP SS ?GU: Foley in place draining clear yellow urine  ?Ext: NT, No erythema ? ?Lab Results: ?Recent Labs  ?  09/01/21 ?1305 09/02/21 ?0430  ?HGB 13.1 12.7*  ?HCT 38.7* 37.9*  ? ? ?Recent Labs  ?  09/01/21 ?8592 09/02/21 ?0430  ?NA 138 136  ?K 3.9 4.2  ?CL 105 103  ?CO2 26 26  ?GLUCOSE 96 127*  ?BUN 13 17  ?CREATININE 1.14 1.15  ?CALCIUM 9.4 9.0  ? ? ? ?Studies/Results: ?No results found. ? ?Assessment/Plan: ? ?71 y.o. male s/p left robotic Nephroureterectomy.  Overall doing well post-op.  ? ?- Continue CLD, until nausea improves/passing gas more reliably ?- Medlocked ?- OOB/ambulation ?- Continue Foley catheter ?- DC JP drain prior to discharge ? ?Dispo: DC later today versus tomorrow ? ? LOS: 2 days  ? ?

## 2021-09-04 LAB — CBC
HCT: 40.4 % (ref 39.0–52.0)
Hemoglobin: 13.2 g/dL (ref 13.0–17.0)
MCH: 28.9 pg (ref 26.0–34.0)
MCHC: 32.7 g/dL (ref 30.0–36.0)
MCV: 88.6 fL (ref 80.0–100.0)
Platelets: 237 10*3/uL (ref 150–400)
RBC: 4.56 MIL/uL (ref 4.22–5.81)
RDW: 14.5 % (ref 11.5–15.5)
WBC: 10.7 10*3/uL — ABNORMAL HIGH (ref 4.0–10.5)
nRBC: 0 % (ref 0.0–0.2)

## 2021-09-04 LAB — BASIC METABOLIC PANEL
Anion gap: 7 (ref 5–15)
BUN: 15 mg/dL (ref 8–23)
CO2: 30 mmol/L (ref 22–32)
Calcium: 8.9 mg/dL (ref 8.9–10.3)
Chloride: 100 mmol/L (ref 98–111)
Creatinine, Ser: 1.46 mg/dL — ABNORMAL HIGH (ref 0.61–1.24)
GFR, Estimated: 51 mL/min — ABNORMAL LOW (ref >60)
Glucose, Bld: 98 mg/dL (ref 70–99)
Potassium: 3.6 mmol/L (ref 3.5–5.1)
Sodium: 137 mmol/L (ref 135–145)

## 2021-09-04 LAB — SURGICAL PATHOLOGY

## 2021-09-04 LAB — CREATININE, SERUM
Creatinine, Ser: 1.37 mg/dL — ABNORMAL HIGH (ref 0.61–1.24)
GFR, Estimated: 55 mL/min — ABNORMAL LOW (ref >60)

## 2021-09-04 MED ORDER — OXYCODONE-ACETAMINOPHEN 5-325 MG PO TABS: 1.0000 | ORAL_TABLET | ORAL | 0 refills | Status: DC | PRN

## 2021-09-04 MED ORDER — POLYETHYLENE GLYCOL 3350 17 G PO PACK: 17.0000 g | PACK | Freq: Every day | ORAL | 0 refills | Status: DC

## 2021-09-04 NOTE — Discharge Summary (Signed)
Date of admission: 09/01/2021 ? ?Date of discharge: 09/04/2021 ? 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 ?Secondary diagnoses:   ?Active Ambulatory Problems  ?  Diagnosis Date Noted  ? Benign prostate hyperplasia 04/01/2014  ? Sepsis ([place]) 03/05/2018  ? AKI (acute kidney injury) ([place]) 03/05/2018  ? Malnutrition of moderate degree 03/07/2018  ? Orthostasis 03/08/2018  ? Septic shock ([place]) 04/21/2018  ? Ureteropelvic junction (UPJ) obstruction   ? Trochanteric bursitis of left hip 11/07/2018  ? Unstable angina ([place]) 08/07/2020  ? Atrial fibrillation ([place]) 08/07/2020  ? Hyperlipidemia 08/07/2020  ? Coronary artery disease involving native coronary artery of native heart with angina pectoris ([place]) 12/12/2020  ? ?Resolved Ambulatory Problems  ?  Diagnosis Date Noted  ? No Resolved Ambulatory Problems  ? ?Past Medical History:  ?Diagnosis Date  ? Benign localized prostatic hyperplasia with lower urinary tract symptoms (LUTS)   ? Bladder cancer [facility]) urologsit-  dr winter  ? COPD with emphysema ([place])   ? Coronary artery disease primary cardiologist-- dr Arville Lime  ? Dizziness   ? Dysrhythmia 2019  ? Dysuria   ? Feeling light headed   ? Fibromyalgia   ? GERD (gastroesophageal reflux disease)   ? History of kidney stones   ? History of skin cancer   ? Hypothyroidism   ? OA (osteoarthritis)   ? PAF (paroxysmal atrial fibrillation) ([place]) 03/05/2018  ? PONV (postoperative nausea and vomiting)   ? S/P percutaneous transluminal coronary angioplasty   ? Wears glasses   ?  ? ?History and Physical: For full details, please see admission history and physical. Briefly, Marvin Gill is a 71 y.o. year old patient who was admitted s/p robotic left nephroureterectomy ? ?Hospital Course:  ? ?On POD 2, Marvin Gill failed a voiding trial and foley was replaced ? ?On the  day of discharge, the patient was tolerating a regular diet and their pain was well controlled. They were determined to be stable for discharge home ? ?Laboratory values:  ?Recent Labs  ?  09/02/21 ?0430 09/03/21 ?0728 09/04/21 ?1937  ?HGB 12.7* 12.5* 13.2  ?HCT 37.9* 38.1* 40.4  ? ?Recent Labs  ?  09/04/21 ?0429 09/04/21 ?1041  ?CREATININE 1.46* 1.37*  ? ? ?Disposition: Home ? ?Discharge medications:  ?Allergies as of 09/04/2021   ? ?   Reactions  ? Codeine Nausea And Vomiting  ? "made my head feel funny too"  ? Duloxetine Hcl Nausea And Vomiting  ? Pregabalin   ? suicidal thoughts with lyrica  ? Vicodin Hp [hydrocodone-acetaminophen] Rash  ? ?  ? ?  ?Medication List  ?  ? ?TAKE these medications   ? ?acetaminophen 650 MG CR tablet ?Commonly known as: TYLENOL ?Take 1,300 mg by mouth every 8 (eight) hours as needed for pain. ?  ?ALPRAZolam 1 MG tablet ?Commonly known as: Duanne Moron ?Take 0.5-1 mg by mouth See admin instructions. Take 0.5 mg in the morning and 1 mg at night ?  ?Eliquis 5 MG Tabs tablet ?Generic drug: apixaban ?TAKE 1 TABLET(5 MG) BY MOUTH TWICE DAILY ?  ?esomeprazole 40 MG capsule ?Commonly known as: [place] ?Take 40 mg by mouth daily. ?  ?gabapentin 800 MG tablet ?Commonly known as: NEURONTIN ?Take 800 mg by mouth 3 (three) times daily. ?  ?levothyroxine 50 MCG tablet ?Commonly known as: SYNTHROID ?  Take 1 tablet (50 mcg total) by mouth daily before breakfast. ?  ?nitroGLYCERIN 0.4 MG SL tablet ?Commonly known as: NITROSTAT ?Place 1 tablet (0.4 mg total) under the tongue every 5 (five) minutes as needed for chest pain. ?  ?oxyCODONE-acetaminophen 5-325 MG tablet ?Commonly known as: Percocet ?Take 1 tablet by mouth every 4 (four) hours as needed for severe pain. ?What changed: Another medication with the same name was added. Make sure you understand how and when to take each. ?  ?oxyCODONE-acetaminophen 5-325 MG tablet ?Commonly known as: Percocet ?Take 1 tablet by mouth every 4 (four) hours as needed for  severe pain. ?What changed: You were already taking a medication with the same name, and this prescription was added. Make sure you understand how and when to take each. ?  ?phenazopyridine 200 MG tablet ?Commonly known as: Pyridium ?Take 1 tablet (200 mg total) by mouth 3 (three) times daily as needed (for pain with urination). ?  ?polyethylene glycol 17 g packet ?Commonly known as: MiraLax ?Take 17 g by mouth daily. ?  ?pravastatin 40 MG tablet ?Commonly known as: PRAVACHOL ?Take 40 mg by mouth at bedtime. ?  ?sodium chloride 0.65 % nasal spray ?Commonly known as: OCEAN ?Place 1 spray into the nose at bedtime as needed for congestion. ?  ?tamsulosin 0.4 MG Caps capsule ?Commonly known as: FLOMAX ?Take 0.4 mg by mouth 2 (two) times daily. ?  ?valACYclovir 1000 MG tablet ?Commonly known as: VALTREX ?Take 1,000 mg by mouth daily as needed (fever blister). ?  ? ?  ? ?  ?  ? ? ?  ?Discharge Care Instructions  ?(From admission, onward)  ?  ? ? ?  ? ?  Start     Ordered  ? 09/04/21 0000  If the dressing is still on your incision site when you go home, remove it on the third day after your surgery date. Remove dressing if it begins to fall off, or if it is dirty or damaged before the third day.       ? 09/04/21 1326  ? ?  ?  ? ?  ? ? ?Followup:  ? Follow-up Information   ? ? Ceasar Mons, MD Follow up on 09/17/2021.   ?Specialty: Urology ?Why: Postop appointment at 8:15 AM ?Contact information: ?[address] ?[address] ?[place] [address] ?[phone] ? ? ?  ?  ? ? [facility] Follow up on 09/08/2021.   ?Why: My office will call to schedule an appointment to have your catheter removed in the office on 09/08/2021. ?Contact information: ?[address] ?[place] ?[phone] ? ?  ?  ? ?  ?  ? ?  ? ? ?

## 2021-09-04 NOTE — Plan of Care (Signed)
PIVs removed. Pt belongings gathered. DC paperwork reviewed. Pt converted from foley bag to leg bag. ? ? ?Problem: Clinical Measurements: ?Goal: Ability to maintain clinical measurements within normal limits will improve ?Outcome: Completed/Met ?Goal: Will remain free from infection ?Outcome: Completed/Met ?Goal: Diagnostic test results will improve ?Outcome: Completed/Met ?Goal: Cardiovascular complication will be avoided ?Outcome: Completed/Met ?  ?Problem: Activity: ?Goal: Risk for activity intolerance will decrease ?Outcome: Completed/Met ?  ?Problem: Nutrition: ?Goal: Adequate nutrition will be maintained ?Outcome: Completed/Met ?  ?Problem: Coping: ?Goal: Level of anxiety will decrease ?Outcome: Completed/Met ?  ?Problem: Elimination: ?Goal: Will not experience complications related to bowel motility ?Outcome: Completed/Met ?Goal: Will not experience complications related to urinary retention ?Outcome: Completed/Met ?  ?Problem: Pain Managment: ?Goal: General experience of comfort will improve ?Outcome: Completed/Met ?  ?Problem: Safety: ?Goal: Ability to remain free from injury will improve ?Outcome: Completed/Met ?  ?Problem: Skin Integrity: ?Goal: Risk for impaired skin integrity will decrease ?Outcome: Completed/Met ?  ?

## 2021-09-04 NOTE — Progress Notes (Signed)
3 Days Post-Op ?Subjective: ?No acute events overnight. Patient tolerating regular diet. +OOB to ambulate. +flatus, no BM ? ?Creatinine up a little this AM. Patient reports he did not take in very much fluids yesterday and had 3L of urine output ? ?Failed voiding trial yesterday and foley replaced ? ?Objective: ?Vital signs in last 24 hours: ?Temp:  [98.9 ?F (37.2 ?C)-99.1 ?F (37.3 ?C)] 98.9 ?F (37.2 ?C) (04/07 0545) ?Pulse Rate:  [61-74] 61 (04/07 0545) ?Resp:  [14-17] 16 (04/07 0545) ?BP: (120-147)/(72-88) 120/72 (04/07 0545) ?SpO2:  [94 %-96 %] 96 % (04/07 0545) ? ?Intake/Output from previous day: ?04/06 0701 - 04/07 0700 ?In: 120 [P.O.:120] ?Out: 3025 [Urine:3005; Drains:20] ?Intake/Output this shift: ?Total I/O ?In: 310 [P.O.:60; IV Piggyback:250] ?Out: -  ? ?Physical Exam:  ?General: Alert and oriented ?CV: RRR ?Lungs: Clear ?Abdomen: Soft, ND, ATTP; inc c/d/I ?Ext: NT, No erythema ?Foley draining clear yellow ? ?Lab Results: ?Recent Labs  ?  09/02/21 ?0430 09/03/21 ?0728 09/04/21 ?0429  ?HGB 12.7* 12.5* 13.2  ?HCT 37.9* 38.1* 40.4  ? ?BMET ?Recent Labs  ?  09/03/21 ?0728 09/04/21 ?0429  ?NA 136 137  ?K 4.5 3.6  ?CL 101 100  ?CO2 30 30  ?GLUCOSE 95 98  ?BUN 15 15  ?CREATININE 1.18 1.46*  ?CALCIUM 9.2 8.9  ? ? ? ?Studies/Results: ?No results found. ? ?Assessment/Plan: ?Marvin Gill is POD 3 s/p left robotic Nephroureterectomy. Creatinine trended up slightly this AM ? ?-will recheck creatinine later this morning. Advised patient to increase fluid intake. If stable or better likely can d/c home with foley in place ? ? LOS: 3 days  ? ?Donald Pore MD ?09/04/2021, 8:22 AM ?[facility]  ?Pager: [phone] ? ? ? ?

## 2021-09-04 NOTE — Care Management Important Message (Signed)
Important Message ? ?Patient Details IM Letter placed in Patients room. ?Name: Marvin Gill ?MRN: [id number] ?Date of Birth: 10/20/1950 ? ? ?Medicare Important Message Given:  Yes ? ? ? ? ?Kerin Salen ?09/04/2021, 2:53 PM ?

## 2021-09-08 ENCOUNTER — Telehealth: Payer: Self-pay | Admitting: Oncology

## 2021-09-08 NOTE — Telephone Encounter (Signed)
Scheduled appt per 4/11 referral. Pt is aware of appt date and time. Pt is aware to arrive 15 mins prior to appt time and to bring and updated insurance card. Pt is aware of appt location.   ?

## 2021-09-16 ENCOUNTER — Inpatient Hospital Stay: Payer: Medicare HMO | Attending: Oncology | Admitting: Oncology

## 2021-09-16 ENCOUNTER — Other Ambulatory Visit: Payer: Self-pay

## 2021-09-16 VITALS — BP 122/84 | HR 68 | Temp 97.5°F | Resp 17 | Wt 192.1 lb

## 2021-09-16 DIAGNOSIS — C669 Malignant neoplasm of unspecified ureter: Secondary | ICD-10-CM

## 2021-09-16 DIAGNOSIS — Z87891 Personal history of nicotine dependence: Secondary | ICD-10-CM | POA: Diagnosis not present

## 2021-09-16 NOTE — Progress Notes (Signed)
?Reason for the request:    Urothelial carcinoma ? ?HPI: I was asked by Dr. Lovena Neighbours to evaluate Marvin Gill with evaluation of ureteral cancer.  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  At that time he had a TA lesion and subsequently developed a stenotic left ureteral orifice and left hydronephrosis.  He underwent CT scan in February 2023 that revealed worsening left-sided hydronephrosis and marked thickening of the left renal cortex.  Based on these findings he underwent robotic assisted laparoscopic left nephro ureterectomy completed on September 01, 2021.  He tolerated the procedure well and the final pathology revealed infiltrating high-grade urothelial carcinoma with squamous cell differentiation involving 70%.  The tumor invades beyond the muscularis into the periureteral fat indicating a T3 tumor.  Lymphovascular invasion was identified the distal and the peripheral ureter resection margins are positive.  No lymph nodes were resected at that time.  He is postoperative labs showed creatinine of 1.37 and a creatinine clearance of around 55 cc/min.  Since his surgery, he reports feeling well although overall fatigue and recovering slowly.  He denies any nausea, vomiting or abdominal pain.  He denies any hematuria or dysuria. ? ?He does not report any headaches, blurry vision, syncope or seizures. Does not report any fevers, chills or sweats.  Does not report any cough, wheezing or hemoptysis.  Does not report any chest pain, palpitation, orthopnea or leg edema.  Does not report any nausea, vomiting or abdominal pain.  Does not report any constipation or diarrhea.  Does not report any skeletal complaints.    Does not report frequency, urgency or hematuria.  Does not report any skin rashes or lesions. Does not report any heat or cold intolerance.  Does not report any lymphadenopathy or petechiae.  Does not report any anxiety or depression.   Remaining review of systems is negative.  ? ? ? ?Past Medical History:  ?Diagnosis Date  ? Benign localized prostatic hyperplasia with lower urinary tract symptoms (LUTS)   ? Bladder cancer [facility]) urologsit-  dr winter  ? BCG treatment's   ? 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 Coronary artery disease primary cardiologist-- dr Arville Lime  ? a. s/p PTCA to dLAD 2000. b. Normal coronaries in 2003, patent prior PTCA site.  ? Dizziness   ? intermittant  ? Dysrhythmia 2019  ? AF-Eliquis started  ? Dysuria   ? Feeling light headed   ? intermittant when rolls over in bed in am last time 07-10-2021 resolves after sits up a few minutes per pt  ? Fibromyalgia   ? GERD (gastroesophageal reflux disease)   ? History of kidney stones   ? History of skin cancer   ? Hypothyroidism   ? OA (osteoarthritis)   ? back, shoulders , hips  ? PAF (paroxysmal atrial fibrillation) ([place]) 03/05/2018  ? dx in setting sepsis---  cardiologist-- dr Arville Lime (per dr Denman George note in epic pt was to stop eliquis due to hematuria last dose 09-05-2018)  ? PONV (postoperative nausea and vomiting)   ? NAUSEA ALL DAY AFTER 02-01-17 SURGERY, PT WANTS ANESTHESIA LIKE 06-23-15 SURGERY WITH DR  [name]  ? S/P percutaneous transluminal coronary angioplasty   ? 05-18-1999  to dLAD  ? Wears glasses   ?: ? ? ?Past Surgical History:  ?Procedure Laterality Date  ? CARDIAC CATHETERIZATION  06-14-2001  dr Einar Gip  ? normal coronary arteries and patent previous dLAD   ? CARDIOVASCULAR  STRESS TEST  06/12/2008  ? Low risk nuclear study w/ no evidence ischemia/  normal LV funciton and wall motion , ef 60%  ? COLONOSCOPY WITH PROPOFOL N/A 12/13/2013  ? Procedure: COLONOSCOPY WITH PROPOFOL;  Surgeon: Garlan Fair, MD;  Location: [facility];  Service: Endoscopy;  Laterality: N/A;  ? CORONARY ANGIOPLASTY  05-18-1999  dr Tami Ribas  ? PTCA balloon to dLAD 80% (single vessel disease),  ef 60%  ? [place] N/A 08/15/2017  ? Procedure: CYSTOSCOP/ BLADDER BIOPSY/TURBT;   Surgeon: Nickie Retort, MD;  Location: [facility];  Service: Urology;  Laterality: N/A;  ? CYSTOSCOPY WITH INSERTION OF UROLIFT N/A 06/23/2015  ? Procedure: CYSTOSCOPY WITH INSERTION OF UROLIFT;  Surgeon: Carolan Clines, MD;  Location: [facility];  Service: Urology;  Laterality: N/A;  ? CYSTOSCOPY WITH STENT PLACEMENT Left 04/21/2018  ? Procedure: CYSTOSCOPY WITH STENT PLACEMENT;  Surgeon: Bjorn Loser, MD;  Location: [place];  Service: Urology;  Laterality: Left;  Emergent case  ? CYSTOSCOPY WITH URETEROSCOPY AND STENT PLACEMENT Left 04/07/2018  ? Procedure: CYSTOSCOPY WITH URETEROSCOPY AND STENT EXCHANGE/ BLADDER BIOPSY/LASER ABLATION OF URETERAL TUMOR;  Surgeon: Ceasar Mons, MD;  Location: [facility];  Service: Urology;  Laterality: Left;  ONLY NEEDS 60 MIN  ? CYSTOSCOPY WITH URETEROSCOPY AND STENT PLACEMENT Left 11/28/2018  ? Procedure: CYSTOSCOPY WITH URETEROSCOPY , LEFT URETERAL AND BLADDER BIOPSY, LEFT STENT PLACEMENT;  Surgeon: Ceasar Mons, MD;  Location: [facility];  Service: Urology;  Laterality: Left;  ? CYSTOSCOPY WITH URETEROSCOPY AND STENT PLACEMENT Left 07/15/2021  ? Procedure: CYSTOSCOPY WITH URETEROSCOPY AND STENT PLACEMENT;  Surgeon: Ceasar Mons, MD;  Location: [facility];  Service: Urology;  Laterality: Left;  ONLY NEEDS 45 MIN  ? CYSTOSCOPY/URETEROSCOPY/HOLMIUM LASER/STENT PLACEMENT Bilateral 02/01/2017  ? Procedure: CYSTOSCOPY/URETEROSCOPY/HOLMIUM LASER/STENT PLACEMENT,STONE BASKETRY, BLADDER BIOPSY;  Surgeon: Nickie Retort, MD;  Location: [facility];  Service: Urology;  Laterality: Bilateral;  ? ESOPHAGOGASTRODUODENOSCOPY  08-24-2006  ? LEFT HEART CATH AND CORONARY ANGIOGRAPHY N/A 08/07/2020  ? Procedure: LEFT HEART CATH AND CORONARY ANGIOGRAPHY;  Surgeon: [name], Peter M, MD;  Location: [place];  Service: Cardiovascular;  Laterality: N/A;  ? ROBOT ASSITED  LAPAROSCOPIC NEPHROURETERECTOMY Left 09/01/2021  ? Procedure: XI ROBOT ASSITED LAPAROSCOPIC NEPHROURETERECTOMY/ TRANSURETHRAL RESECTION OF LEFT URETERAL ORIFICE;  Surgeon: Ceasar Mons, MD;  Location: [facility];  Service: Urology;  Laterality: Left;  ? TRANSTHORACIC ECHOCARDIOGRAM  03/06/2018  ? mild LVH,  ef 55-60%,  pt in atrial fib. LV diastolic not evaluated/  mild LAE and RAE/  moderate AV calcification with sclerosis , no stenosis or regurg./  mild dilated aortic root, 4m/  trival MR and TR  ? TRANSURETHRAL INCISION OF PROSTATE N/A 04/01/2014  ? Procedure: TRANSURETHRAL INCISION OF THE PROSTATE (TUIP);  Surgeon: SAilene Rud MD;  [facility]  Service: Urology;  Laterality: N/A;  ?: ? ? ?Current Outpatient Medications:  ?  acetaminophen (TYLENOL) 650 MG CR tablet, Take 1,300 mg by mouth every 8 (eight) hours as needed for pain., Disp: , Rfl:  ?  ALPRAZolam (XANAX) 1 MG tablet, Take 0.5-1 mg by mouth See admin instructions. Take 0.5 mg in the morning and 1 mg at night, Disp: , Rfl:  ?  ELIQUIS 5 MG TABS tablet, TAKE 1 TABLET(5 MG) BY MOUTH TWICE DAILY, Disp: 180 tablet, Rfl: 1 ?  esomeprazole (NEXIUM) 40 MG capsule, Take 40 mg by mouth daily. ,  Disp: , Rfl:  ?  gabapentin (NEURONTIN) 800 MG tablet, Take 800 mg by mouth 3 (three) times daily. , Disp: , Rfl: 3 ?  levothyroxine (SYNTHROID, LEVOTHROID) 50 MCG tablet, Take 1 tablet (50 mcg total) by mouth daily before breakfast., Disp: 30 tablet, Rfl: 0 ?  nitroGLYCERIN (NITROSTAT) 0.4 MG SL tablet, Place 1 tablet (0.4 mg total) under the tongue every 5 (five) minutes as needed for chest pain., Disp: 25 tablet, Rfl: 3 ?  oxyCODONE-acetaminophen (PERCOCET) 5-325 MG tablet, Take 1 tablet by mouth every 4 (four) hours as needed for severe pain. (Patient not taking: Reported on 08/17/2021), Disp: 12 tablet, Rfl: 0 ?  oxyCODONE-acetaminophen (PERCOCET) 5-325 MG tablet, Take 1 tablet by mouth every 4 (four) hours as needed for  severe pain., Disp: 20 tablet, Rfl: 0 ?  phenazopyridine (PYRIDIUM) 200 MG tablet, Take 1 tablet (200 mg total) by mouth 3 (three) times daily as needed (for pain with urination). (Patient not taking: Reported

## 2021-09-17 DIAGNOSIS — C669 Malignant neoplasm of unspecified ureter: Secondary | ICD-10-CM | POA: Diagnosis not present

## 2021-09-17 DIAGNOSIS — R8279 Other abnormal findings on microbiological examination of urine: Secondary | ICD-10-CM | POA: Diagnosis not present

## 2021-09-28 ENCOUNTER — Telehealth: Payer: Self-pay | Admitting: Cardiovascular Disease

## 2021-09-28 ENCOUNTER — Encounter: Payer: Self-pay | Admitting: *Deleted

## 2021-09-28 NOTE — Telephone Encounter (Signed)
Pt c/o medication issue: ? ?1. Name of Medication:  ? ELIQUIS 5 MG TABS tablet  ? ? ?2. How are you currently taking this medication (dosage and times per day)? As prescribed ? ?3. Are you having a reaction (difficulty breathing--STAT)? Yes  ? ?4. What is your medication issue? Has been bleeding from scrotum since recent surgery.  ?Wants to know if he can hold medication due to this issue.  ? ?Please advice.    ?

## 2021-09-28 NOTE — Telephone Encounter (Signed)
Returned call to patient who states that he had a L nephro ureterectomy on 09/01/21 and was fine at his 09/16/21 checkup, but noticed that one week ago (approx) he was having bleeding from his penis. Initially the blood was light in color and amount, but is now described as heavy and dark. Pt states he has called dr Jackson Latino office, but has not gotten a return call. Has not reached out to Dr Alen Blew (oncologist). Asking if he can hold his Eliquis "for a few days" to see if the bleeding stops. Will route to nahser for directive. ?

## 2021-09-29 NOTE — Telephone Encounter (Signed)
Nahser, Marvin Cheng, MD sent to Donnalee Curry K ?Caller: Unspecified (Yesterday,  4:57 PM) ?I called patient in the evening of May 1.  ?He may hold his eliquis for 2-3 days to allow his bleeding to stop  ?Ive asked him to call us back in 3 days and let us know how he is doing  ? ?PN  ?

## 2021-09-30 ENCOUNTER — Telehealth: Payer: Self-pay | Admitting: *Deleted

## 2021-09-30 ENCOUNTER — Telehealth: Payer: Self-pay | Admitting: Oncology

## 2021-09-30 NOTE — Telephone Encounter (Signed)
PC to patient, informed him he has a lab appointment on 10/08/21 at 9:00 at [facility] and then appointment for PET scan at [facility] at 10:00 the same day.  He needs to be NPO for 6 hours prior to PET scan, may have water only.  Patient verbalizes understanding. ?

## 2021-09-30 NOTE — Telephone Encounter (Signed)
Opened in error

## 2021-09-30 NOTE — Telephone Encounter (Signed)
Called patient regarding upcoming May appointments, left a voicemail. 

## 2021-10-01 DIAGNOSIS — N3021 Other chronic cystitis with hematuria: Secondary | ICD-10-CM | POA: Diagnosis not present

## 2021-10-01 DIAGNOSIS — L139 Bullous disorder, unspecified: Secondary | ICD-10-CM | POA: Diagnosis not present

## 2021-10-07 ENCOUNTER — Other Ambulatory Visit: Payer: Medicare HMO

## 2021-10-08 ENCOUNTER — Inpatient Hospital Stay: Payer: Medicare HMO | Attending: Oncology

## 2021-10-08 ENCOUNTER — Other Ambulatory Visit: Payer: Self-pay

## 2021-10-08 ENCOUNTER — Encounter (HOSPITAL_COMMUNITY)
Admission: RE | Admit: 2021-10-08 | Discharge: 2021-10-08 | Disposition: A | Discharge status: Home / Self Care | Payer: Medicare HMO | Source: Ambulatory Visit | Attending: Oncology | Admitting: Oncology

## 2021-10-08 DIAGNOSIS — C679 Malignant neoplasm of bladder, unspecified: Secondary | ICD-10-CM | POA: Insufficient documentation

## 2021-10-08 DIAGNOSIS — C669 Malignant neoplasm of unspecified ureter: Secondary | ICD-10-CM

## 2021-10-08 DIAGNOSIS — N179 Acute kidney failure, unspecified: Secondary | ICD-10-CM | POA: Insufficient documentation

## 2021-10-08 DIAGNOSIS — Z905 Acquired absence of kidney: Secondary | ICD-10-CM | POA: Diagnosis not present

## 2021-10-08 DIAGNOSIS — N200 Calculus of kidney: Secondary | ICD-10-CM | POA: Diagnosis not present

## 2021-10-08 DIAGNOSIS — D4959 Neoplasm of unspecified behavior of other genitourinary organ: Secondary | ICD-10-CM | POA: Insufficient documentation

## 2021-10-08 DIAGNOSIS — C662 Malignant neoplasm of left ureter: Secondary | ICD-10-CM | POA: Insufficient documentation

## 2021-10-08 DIAGNOSIS — I700 Atherosclerosis of aorta: Secondary | ICD-10-CM | POA: Insufficient documentation

## 2021-10-08 LAB — CMP (CANCER CENTER ONLY)
ALT: 5 U/L (ref 0–44)
AST: 9 U/L — ABNORMAL LOW (ref 15–41)
Albumin: 4 g/dL (ref 3.5–5.0)
Alkaline Phosphatase: 87 U/L (ref 38–126)
Anion gap: 7 (ref 5–15)
BUN: 15 mg/dL (ref 8–23)
CO2: 28 mmol/L (ref 22–32)
Calcium: 9.6 mg/dL (ref 8.9–10.3)
Chloride: 103 mmol/L (ref 98–111)
Creatinine: 1.25 mg/dL — ABNORMAL HIGH (ref 0.61–1.24)
GFR, Estimated: >60 mL/min (ref >60)
Glucose, Bld: 108 mg/dL — ABNORMAL HIGH (ref 70–99)
Potassium: 4.1 mmol/L (ref 3.5–5.1)
Sodium: 138 mmol/L (ref 135–145)
Total Bilirubin: 0.7 mg/dL (ref 0.3–1.2)
Total Protein: 7.6 g/dL (ref 6.5–8.1)

## 2021-10-08 LAB — CBC WITH DIFFERENTIAL (CANCER CENTER ONLY)
Abs Immature Granulocytes: 0.04 10*3/uL (ref 0.00–0.07)
Basophils Absolute: 0.1 10*3/uL (ref 0.0–0.1)
Basophils Relative: 0 %
Eosinophils Absolute: 0.3 10*3/uL (ref 0.0–0.5)
Eosinophils Relative: 2 %
HCT: 40.8 % (ref 39.0–52.0)
Hemoglobin: 13.6 g/dL (ref 13.0–17.0)
Immature Granulocytes: 0 %
Lymphocytes Relative: 14 %
Lymphs Abs: 1.9 10*3/uL (ref 0.7–4.0)
MCH: 28.6 pg (ref 26.0–34.0)
MCHC: 33.3 g/dL (ref 30.0–36.0)
MCV: 85.9 fL (ref 80.0–100.0)
Monocytes Absolute: 1.1 10*3/uL — ABNORMAL HIGH (ref 0.1–1.0)
Monocytes Relative: 9 %
Neutro Abs: 10 10*3/uL — ABNORMAL HIGH (ref 1.7–7.7)
Neutrophils Relative %: 75 %
Platelet Count: 279 10*3/uL (ref 150–400)
RBC: 4.75 MIL/uL (ref 4.22–5.81)
RDW: 13.8 % (ref 11.5–15.5)
WBC Count: 13.4 10*3/uL — ABNORMAL HIGH (ref 4.0–10.5)
nRBC: 0 % (ref 0.0–0.2)

## 2021-10-08 LAB — GLUCOSE, CAPILLARY: Glucose-Capillary: 97 mg/dL (ref 70–99)

## 2021-10-08 MED ORDER — FLUDEOXYGLUCOSE F - 18 (FDG) INJECTION
9.5000 | Freq: Once | INTRAVENOUS | Status: AC | PRN
  Administered 2021-10-08: 9.5 via INTRAVENOUS

## 2021-10-09 ENCOUNTER — Other Ambulatory Visit: Payer: Self-pay

## 2021-10-09 ENCOUNTER — Encounter (HOSPITAL_COMMUNITY): Payer: Self-pay

## 2021-10-09 ENCOUNTER — Emergency Department (HOSPITAL_COMMUNITY)
Admission: EM | Admit: 2021-10-09 | Discharge: 2021-10-09 | Discharge status: Against Medical Advice | Payer: Medicare HMO | Attending: Physician Assistant | Admitting: Physician Assistant

## 2021-10-09 DIAGNOSIS — Z5321 Procedure and treatment not carried out due to patient leaving prior to being seen by health care provider: Secondary | ICD-10-CM | POA: Insufficient documentation

## 2021-10-09 DIAGNOSIS — R319 Hematuria, unspecified: Secondary | ICD-10-CM | POA: Diagnosis not present

## 2021-10-09 LAB — BASIC METABOLIC PANEL
Anion gap: 6 (ref 5–15)
BUN: 16 mg/dL (ref 8–23)
CO2: 26 mmol/L (ref 22–32)
Calcium: 9.5 mg/dL (ref 8.9–10.3)
Chloride: 107 mmol/L (ref 98–111)
Creatinine, Ser: 1.38 mg/dL — ABNORMAL HIGH (ref 0.61–1.24)
GFR, Estimated: 54 mL/min — ABNORMAL LOW (ref >60)
Glucose, Bld: 113 mg/dL — ABNORMAL HIGH (ref 70–99)
Potassium: 3.8 mmol/L (ref 3.5–5.1)
Sodium: 139 mmol/L (ref 135–145)

## 2021-10-09 LAB — URINALYSIS, ROUTINE W REFLEX MICROSCOPIC
Bacteria, UA: NONE SEEN
Bilirubin Urine: NEGATIVE
Glucose, UA: NEGATIVE mg/dL
Ketones, ur: 5 mg/dL — AB
Nitrite: NEGATIVE
Protein, ur: 100 mg/dL — AB
RBC / HPF: >50 RBC/hpf — ABNORMAL HIGH (ref 0–5)
Specific Gravity, Urine: 1.025 (ref 1.005–1.030)
pH: 5 (ref 5.0–8.0)

## 2021-10-09 LAB — CBC WITH DIFFERENTIAL/PLATELET
Abs Immature Granulocytes: 0.06 10*3/uL (ref 0.00–0.07)
Basophils Absolute: 0.1 10*3/uL (ref 0.0–0.1)
Basophils Relative: 1 %
Eosinophils Absolute: 0.2 10*3/uL (ref 0.0–0.5)
Eosinophils Relative: 2 %
HCT: 43.3 % (ref 39.0–52.0)
Hemoglobin: 14.9 g/dL (ref 13.0–17.0)
Immature Granulocytes: 0 %
Lymphocytes Relative: 12 %
Lymphs Abs: 1.8 10*3/uL (ref 0.7–4.0)
MCH: 30.4 pg (ref 26.0–34.0)
MCHC: 34.4 g/dL (ref 30.0–36.0)
MCV: 88.4 fL (ref 80.0–100.0)
Monocytes Absolute: 1 10*3/uL (ref 0.1–1.0)
Monocytes Relative: 7 %
Neutro Abs: 11.6 10*3/uL — ABNORMAL HIGH (ref 1.7–7.7)
Neutrophils Relative %: 78 %
Platelets: 293 10*3/uL (ref 150–400)
RBC: 4.9 MIL/uL (ref 4.22–5.81)
RDW: 14 % (ref 11.5–15.5)
WBC: 14.7 10*3/uL — ABNORMAL HIGH (ref 4.0–10.5)
nRBC: 0 % (ref 0.0–0.2)

## 2021-10-09 LAB — TYPE AND SCREEN
ABO/RH(D): O POS
Antibody Screen: NEGATIVE

## 2021-10-09 LAB — PROTIME-INR
INR: 1.2 (ref 0.8–1.2)
Prothrombin Time: 15.1 seconds (ref 11.4–15.2)

## 2021-10-09 NOTE — ED Triage Notes (Signed)
Pt reports hematuria x3 weeks since nephrectomy. Pt was sent here for possible blood transfusion.  ?

## 2021-10-09 NOTE — ED Notes (Signed)
Pt states that he is leaving because he has been here for 6 hrs.  ?

## 2021-10-09 NOTE — ED Provider Triage Note (Signed)
Emergency Medicine Provider Triage Evaluation Note ? ?Marvin Gill , a 71 y.o. male  was evaluated in triage.  Pt complains of hematuria and abnormal lab.  History of A-fib on chronic anticoagulation, Eliquis, prior bladder cancer history of kidney stones, BPH has had hematuria over the last month.  Noted to have increased fatigue and weakness.  Was told by MD to come here and be evaluated for a possible transfusion.  No fever, pain, emesis ? ? ?Underwent robotic assisted laparoscopic left nephro ureterectomy completed on September 01, 2021 for CA ?Review of Systems  ?Positive: hematuria ?Negative:  ? ?Physical Exam  ?BP (!) 122/54 (BP Location: Left Arm)   Pulse 83   Temp 98.4 ?F (36.9 ?C) (Oral)   Resp 18   SpO2 98%  ?Gen:   Awake, no distress   ?Resp:  Normal effort  ?MSK:   Moves extremities without difficulty  ?Other:   ? ?Medical Decision Making  ?Medically screening exam initiated at 4:32 PM.  Appropriate orders placed.  Marvin Gill was informed that the remainder of the evaluation will be completed by another provider, this initial triage assessment does not replace that evaluation, and the importance of remaining in the ED until their evaluation is complete. ? ?Hematuria, abnormal lab, hx of CA ? ?Hgb on labs from yesterday 13.4 ?  ?Marvin Gill A, PA-C ?10/09/21 1635 ? ?

## 2021-10-10 ENCOUNTER — Emergency Department (HOSPITAL_COMMUNITY)
Admission: EM | Admit: 2021-10-10 | Discharge: 2021-10-10 | Disposition: A | Discharge status: Home / Self Care | Payer: Medicare HMO | Attending: Emergency Medicine | Admitting: Emergency Medicine

## 2021-10-10 ENCOUNTER — Encounter (HOSPITAL_COMMUNITY): Payer: Self-pay

## 2021-10-10 DIAGNOSIS — Z85528 Personal history of other malignant neoplasm of kidney: Secondary | ICD-10-CM | POA: Diagnosis not present

## 2021-10-10 DIAGNOSIS — R319 Hematuria, unspecified: Secondary | ICD-10-CM | POA: Diagnosis not present

## 2021-10-10 DIAGNOSIS — Z7901 Long term (current) use of anticoagulants: Secondary | ICD-10-CM | POA: Insufficient documentation

## 2021-10-10 LAB — URINALYSIS, ROUTINE W REFLEX MICROSCOPIC

## 2021-10-10 LAB — BASIC METABOLIC PANEL
Anion gap: 9 (ref 5–15)
BUN: 17 mg/dL (ref 8–23)
CO2: 21 mmol/L — ABNORMAL LOW (ref 22–32)
Calcium: 9.1 mg/dL (ref 8.9–10.3)
Chloride: 107 mmol/L (ref 98–111)
Creatinine, Ser: 1.23 mg/dL (ref 0.61–1.24)
GFR, Estimated: >60 mL/min (ref >60)
Glucose, Bld: 102 mg/dL — ABNORMAL HIGH (ref 70–99)
Potassium: 3.6 mmol/L (ref 3.5–5.1)
Sodium: 137 mmol/L (ref 135–145)

## 2021-10-10 LAB — CBC
HCT: 40.3 % (ref 39.0–52.0)
Hemoglobin: 13.2 g/dL (ref 13.0–17.0)
MCH: 28.9 pg (ref 26.0–34.0)
MCHC: 32.8 g/dL (ref 30.0–36.0)
MCV: 88.2 fL (ref 80.0–100.0)
Platelets: 272 10*3/uL (ref 150–400)
RBC: 4.57 MIL/uL (ref 4.22–5.81)
RDW: 14.3 % (ref 11.5–15.5)
WBC: 14.5 10*3/uL — ABNORMAL HIGH (ref 4.0–10.5)
nRBC: 0 % (ref 0.0–0.2)

## 2021-10-10 LAB — URINALYSIS, MICROSCOPIC (REFLEX): RBC / HPF: >50 RBC/hpf (ref 0–5)

## 2021-10-10 NOTE — ED Provider Notes (Signed)
?[facility] DEPT ?Provider Note ? ? ?CSN: [id number] ?Arrival date & time: 10/10/21  9735 ? ?  ? ?History ? ?Chief Complaint  ?Patient presents with  ? Hematuria  ? ? ?Marvin Gill is a 71 y.o. male. ? ? ?Hematuria ? ?Patient is a 71 year old male with a past medical history significant for renal cell carcinoma with nephrectomy left-sided was done approximately 1 month ago.  He also has a history of atrial fibrillation and is currently taking Eliquis for this. ? ?He states that since he had his surgery he has had blood in his urine.  He has not seen urology since this has been going on until recently when he was able to send a MyChart message to one of his medical providers who recommended he come to the ER for evaluation.  He states he is peeing without difficulty he is spontaneously urinated at 3 AM this morning and again here in the emergency room to provide a sample ? ? ?Denies any abdominal pain no bleeding from his surgical sites and denies any lightheadedness dizziness shortness of breath chest pain fatigue or weakness.  No other associated symptoms.  No aggravating or mitigating factors ?  ? ?Home Medications ?Prior to Admission medications   ?Medication Sig Start Date End Date Taking? Authorizing Provider  ?acetaminophen (TYLENOL) 650 MG CR tablet Take 1,300 mg by mouth every 8 (eight) hours as needed for pain.    [provider]  ?ALPRAZolam Duanne Moron) 1 MG tablet Take 0.5-1 mg by mouth See admin instructions. Take 0.5 mg in the morning and 1 mg at night    [provider]  ?ELIQUIS 5 MG TABS tablet TAKE 1 TABLET(5 MG) BY MOUTH TWICE DAILY 05/26/21   Nahser, Wonda Cheng, MD  ?esomeprazole (NEXIUM) 40 MG capsule Take 40 mg by mouth daily.     [provider]  ?gabapentin (NEURONTIN) 800 MG tablet Take 800 mg by mouth 3 (three) times daily.  12/30/17   [provider]  ?levothyroxine (SYNTHROID, LEVOTHROID) 50 MCG tablet Take 1 tablet (50 mcg  total) by mouth daily before breakfast. 03/10/18   Raiford Noble Latif, DO  ?nitroGLYCERIN (NITROSTAT) 0.4 MG SL tablet Place 1 tablet (0.4 mg total) under the tongue every 5 (five) minutes as needed for chest pain. 08/05/20   Nahser, Wonda Cheng, MD  ?oxyCODONE-acetaminophen (PERCOCET) 5-325 MG tablet Take 1 tablet by mouth every 4 (four) hours as needed for severe pain. 07/15/21   Ceasar Mons, MD  ?oxyCODONE-acetaminophen (PERCOCET) 5-325 MG tablet Take 1 tablet by mouth every 4 (four) hours as needed for severe pain. 09/04/21 09/04/22  Vira Agar, MD  ?phenazopyridine (PYRIDIUM) 200 MG tablet Take 1 tablet (200 mg total) by mouth 3 (three) times daily as needed (for pain with urination). 07/15/21 07/15/22  Ceasar Mons, MD  ?polyethylene glycol (MIRALAX) 17 g packet Take 17 g by mouth daily. 09/04/21   Vira Agar, MD  ?pravastatin (PRAVACHOL) 40 MG tablet Take 40 mg by mouth at bedtime.    [provider]  ?sodium chloride (OCEAN) 0.65 % nasal spray Place 1 spray into the nose at bedtime as needed for congestion.    [provider]  ?tamsulosin (FLOMAX) 0.4 MG CAPS capsule Take 0.4 mg by mouth 2 (two) times daily. 04/02/18   [provider]  ?valACYclovir (VALTREX) 1000 MG tablet Take 1,000 mg by mouth daily as needed (fever blister).    [provider]  ?   ? ?  Allergies    ?Codeine, 12 hour decongestant [nasal spray], Duloxetine hcl, Oxycodone-acetaminophen, Pregabalin, and Vicodin hp [hydrocodone-acetaminophen]   ? ?Review of Systems   ?Review of Systems  ?Genitourinary:  Positive for hematuria.  ? ?Physical Exam ?Updated Vital Signs ?BP 128/76   Pulse 95   Temp 98 ?F (36.7 ?C) (Oral)   Resp 18   Ht '6\' 6"'$  (1.981 m)   Wt 90.7 kg   SpO2 99%   BMI 23.11 kg/m?  ?Physical Exam ?Vitals and nursing note reviewed.  ?Constitutional:   ?   General: He is not in acute distress. ?HENT:  ?   Head: Normocephalic and atraumatic.  ?   Nose: Nose normal.  ?    Mouth/Throat:  ?   Mouth: Mucous membranes are moist.  ?Eyes:  ?   General: No scleral icterus. ?Cardiovascular:  ?   Rate and Rhythm: Normal rate and regular rhythm.  ?   Pulses: Normal pulses.  ?   Heart sounds: Normal heart sounds.  ?Pulmonary:  ?   Effort: Pulmonary effort is normal. No respiratory distress.  ?   Breath sounds: No wheezing.  ?Abdominal:  ?   Palpations: Abdomen is soft.  ?   Tenderness: There is no abdominal tenderness. There is no guarding or rebound.  ?Musculoskeletal:  ?   Cervical back: Normal range of motion.  ?   Right lower leg: No edema.  ?   Left lower leg: No edema.  ?Skin: ?   General: Skin is warm and dry.  ?   Capillary Refill: Capillary refill takes less than 2 seconds.  ?Neurological:  ?   Mental Status: He is alert. Mental status is at baseline.  ?Psychiatric:     ?   Mood and Affect: Mood normal.     ?   Behavior: Behavior normal.  ? ? ?ED Results / Procedures / Treatments   ?Labs ?(all labs ordered are listed, but only abnormal results are displayed) ?Labs Reviewed  ?CBC - Abnormal; Notable for the following components:  ?    Result Value  ? WBC 14.5 (*)   ? All other components within normal limits  ?BASIC METABOLIC PANEL - Abnormal; Notable for the following components:  ? CO2 21 (*)   ? Glucose, Bld 102 (*)   ? All other components within normal limits  ?URINALYSIS, ROUTINE W REFLEX MICROSCOPIC - Abnormal; Notable for the following components:  ? Color, Urine RED (*)   ? APPearance TURBID (*)   ? Glucose, UA   (*)   ? Value: TEST NOT REPORTED DUE TO COLOR INTERFERENCE OF URINE PIGMENT  ? Hgb urine dipstick   (*)   ? Value: TEST NOT REPORTED DUE TO COLOR INTERFERENCE OF URINE PIGMENT  ? Bilirubin Urine   (*)   ? Value: TEST NOT REPORTED DUE TO COLOR INTERFERENCE OF URINE PIGMENT  ? Ketones, ur   (*)   ? Value: TEST NOT REPORTED DUE TO COLOR INTERFERENCE OF URINE PIGMENT  ? Protein, ur   (*)   ? Value: TEST NOT REPORTED DUE TO COLOR INTERFERENCE OF URINE PIGMENT  ? Nitrite    (*)   ? Value: TEST NOT REPORTED DUE TO COLOR INTERFERENCE OF URINE PIGMENT  ? Leukocytes,Ua   (*)   ? Value: TEST NOT REPORTED DUE TO COLOR INTERFERENCE OF URINE PIGMENT  ? All other components within normal limits  ?URINALYSIS, MICROSCOPIC (REFLEX) - Abnormal; Notable for the following components:  ? Bacteria, UA FIELD OBSCURED  BY RBC'S (*)   ? All other components within normal limits  ? ? ?EKG ?None ? ?Radiology ?No results found. ? ?Procedures ?Procedures  ? ? ?Medications Ordered in ED ?Medications - No data to display ? ?ED Course/ Medical Decision Making/ A&P ?Clinical Course as of 10/10/21 0951  ?Sat Oct 10, 2021  ?0821 Hemoglobin seems to be relatively stable at 13.2 yesterday was somewhat elevated [WF]  ?  ?Clinical Course User Index ?[WF] Tedd Sias, [place]  ? ?                        ?Medical Decision Making ?Amount and/or Complexity of Data Reviewed ?Labs: ordered. ? ? ?This patient presents to the ED for concern of hematuria, this involves a number of treatment options, and is a complaint that carries with it a moderate risk of complications and morbidity.  ? ? ?Co morbidities: ?Discussed in HPI ? ? ?Brief History: ? ?Patient is a 71 year old male with a past medical history significant for renal cell carcinoma with nephrectomy left-sided was done approximately 1 month ago.  He also has a history of atrial fibrillation and is currently taking Eliquis for this. ? ?He states that since he had his surgery he has had blood in his urine.  He has not seen urology since this has been going on until recently when he was able to send a MyChart message to one of his medical providers who recommended he come to the ER for evaluation.  He states he is peeing without difficulty he is spontaneously urinated at 3 AM this morning and again here in the emergency room to provide a sample ? ? ?Denies any abdominal pain no bleeding from his surgical sites and denies any lightheadedness dizziness shortness of breath  chest pain fatigue or weakness.  No other associated symptoms.  No aggravating or mitigating factors ? ? ?Physical exam unremarkable. ? ? ?EMR reviewed including pt PMHx, past surgical history and past visits to ER.  ?

## 2021-10-10 NOTE — ED Triage Notes (Signed)
Pt reports he had nephrectomy three weeks ago and "I think they did something to my bladder". Reports hematuria since, worsening in recent days. Endorses DOE. Anticoagulated on eliquis.  ?

## 2021-10-10 NOTE — Discharge Instructions (Addendum)
Please make sure you are hydrating well.  Your labs have all returned quite reassuring.  You certainly do have blood in your urine and will need to see your urologist I recommend calling Monday to make this appointment.  Return to the ER if you have any difficulty urinating or develop abdominal pain or any other new or concerning symptoms. ?

## 2021-10-11 LAB — URINE CULTURE: Culture: NO GROWTH

## 2021-10-15 ENCOUNTER — Other Ambulatory Visit: Payer: Self-pay

## 2021-10-15 ENCOUNTER — Inpatient Hospital Stay: Payer: Medicare HMO | Admitting: Oncology

## 2021-10-15 ENCOUNTER — Other Ambulatory Visit: Payer: Self-pay | Admitting: Oncology

## 2021-10-15 VITALS — BP 135/81 | HR 85 | Temp 97.8°F | Resp 18 | Ht 78.0 in | Wt 186.9 lb

## 2021-10-15 DIAGNOSIS — C662 Malignant neoplasm of left ureter: Secondary | ICD-10-CM | POA: Diagnosis not present

## 2021-10-15 DIAGNOSIS — I700 Atherosclerosis of aorta: Secondary | ICD-10-CM | POA: Insufficient documentation

## 2021-10-15 DIAGNOSIS — N200 Calculus of kidney: Secondary | ICD-10-CM | POA: Insufficient documentation

## 2021-10-15 DIAGNOSIS — C669 Malignant neoplasm of unspecified ureter: Secondary | ICD-10-CM | POA: Diagnosis not present

## 2021-10-15 MED ORDER — MEGESTROL ACETATE 400 MG/10ML PO SUSP: 400.0000 mg | Freq: Every day | ORAL | 1 refills | Status: DC

## 2021-10-15 MED ORDER — PROCHLORPERAZINE MALEATE 10 MG PO TABS: 10.0000 mg | ORAL_TABLET | Freq: Four times a day (QID) | ORAL | 0 refills | Status: DC | PRN

## 2021-10-15 MED ORDER — LIDOCAINE-PRILOCAINE 2.5-2.5 % EX CREA: 1.0000 "application " | TOPICAL_CREAM | CUTANEOUS | 0 refills | Status: AC | PRN

## 2021-10-15 NOTE — Progress Notes (Signed)
Hematology and Oncology Follow Up Visit  Marvin Gill [id number] 08/25/1950 71 y.o. 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  He was found to have T3 high-grade urothelial carcinoma with squamous differentiation in April 2023.  He subsequently developed lymphadenopathy on subsequent imaging studies in May 2023.   Prior Therapy: He is status post robotic assisted laparoscopic left nephro ureterectomy completed on September 01, 2021.  Current therapy: Under evaluation for salvage chemotherapy.  Interim History: Marvin Gill returns today for a follow-up visit.  Since the last visit, he was seen in the emergency department for recurrent hematuria and was advised to stop his anticoagulation.  Since that time, he reports his bleeding is improved and the majority of time his urine is clear although he does report occasional hematuria.  He still has some issues with fatigue and tiredness and his appetite remains marginal.  He has lost some weight since his surgery approximately close to 20 to 30 pounds.     Medications: I have reviewed the patient's current medications.  Current Outpatient Medications  Medication Sig Dispense Refill   acetaminophen (TYLENOL) 650 MG CR tablet Take 1,300 mg by mouth every 8 (eight) hours as needed for pain.     ALPRAZolam (XANAX) 1 MG tablet Take 0.5-1 mg by mouth See admin instructions. Take 0.5 mg in the morning and 1 mg at night     ELIQUIS 5 MG TABS tablet TAKE 1 TABLET(5 MG) BY MOUTH TWICE DAILY 180 tablet 1   esomeprazole (NEXIUM) 40 MG capsule Take 40 mg by mouth daily.      gabapentin (NEURONTIN) 800 MG tablet Take 800 mg by mouth 3 (three) times daily.   3   levothyroxine (SYNTHROID, LEVOTHROID) 50 MCG tablet Take 1 tablet (50 mcg total) by mouth daily before breakfast. 30 tablet 0   nitroGLYCERIN (NITROSTAT) 0.4 MG SL tablet Place 1 tablet (0.4 mg total)  under the tongue every 5 (five) minutes as needed for chest pain. 25 tablet 3   oxyCODONE-acetaminophen (PERCOCET) 5-325 MG tablet Take 1 tablet by mouth every 4 (four) hours as needed for severe pain. 12 tablet 0   oxyCODONE-acetaminophen (PERCOCET) 5-325 MG tablet Take 1 tablet by mouth every 4 (four) hours as needed for severe pain. 20 tablet 0   phenazopyridine (PYRIDIUM) 200 MG tablet Take 1 tablet (200 mg total) by mouth 3 (three) times daily as needed (for pain with urination). 30 tablet 0   polyethylene glycol (MIRALAX) 17 g packet Take 17 g by mouth daily. 14 each 0   pravastatin (PRAVACHOL) 40 MG tablet Take 40 mg by mouth at bedtime.     sodium chloride (OCEAN) 0.65 % nasal spray Place 1 spray into the nose at bedtime as needed for congestion.     tamsulosin (FLOMAX) 0.4 MG CAPS capsule Take 0.4 mg by mouth 2 (two) times daily.  9   valACYclovir (VALTREX) 1000 MG tablet Take 1,000 mg by mouth daily as needed (fever blister).     No current facility-administered medications for this visit.     Allergies:  Allergies  Allergen Reactions   Codeine Nausea And Vomiting    "made my head feel funny too"   12 Hour Decongestant [Nasal Spray]     Other reaction(s): Unknown   Duloxetine Hcl Nausea And Vomiting   Oxycodone-Acetaminophen Other (See Comments)    Hallucinations   Pregabalin     suicidal thoughts  with lyrica   Vicodin Hp [Hydrocodone-Acetaminophen] Rash      Physical Exam: Blood pressure 135/81, pulse 85, temperature 97.8 F (36.6 C), temperature source Temporal, resp. rate 18, height '6\' 6"'$  (1.981 m), weight 186 lb 14.4 oz (84.8 kg), SpO2 100 %. ECOG: 1   General appearance: Comfortable appearing without any discomfort Head: Normocephalic without any trauma Oropharynx: Mucous membranes are moist and pink without any thrush or ulcers. Eyes: Pupils are equal and round reactive to light. Lymph nodes: No cervical, supraclavicular, inguinal or axillary lymphadenopathy.    Heart:regular rate and rhythm.  S1 and S2 without leg edema. Lung: Clear without any rhonchi or wheezes.  No dullness to percussion. Abdomin: Soft, nontender, nondistended with good bowel sounds.  No hepatosplenomegaly. Musculoskeletal: No joint deformity or effusion.  Full range of motion noted. Neurological: No deficits noted on motor, sensory and deep tendon reflex exam. Skin: No petechial rash or dryness.  Appeared moist.      Lab Results: Lab Results  Component Value Date   WBC 14.5 (H) 10/10/2021   HGB 13.2 10/10/2021   HCT 40.3 10/10/2021   MCV 88.2 10/10/2021   PLT 272 10/10/2021     Chemistry      Component Value Date/Time   NA 137 10/10/2021 0743   NA 141 08/05/2020 0936   K 3.6 10/10/2021 0743   CL 107 10/10/2021 0743   CO2 21 (L) 10/10/2021 0743   BUN 17 10/10/2021 0743   BUN 21 08/05/2020 0936   CREATININE 1.23 10/10/2021 0743   CREATININE 1.25 (H) 10/08/2021 0956      Component Value Date/Time   CALCIUM 9.1 10/10/2021 0743   ALKPHOS 87 10/08/2021 0956   AST 9 (L) 10/08/2021 0956   ALT 5 10/08/2021 0956   BILITOT 0.7 10/08/2021 0956      IMPRESSION: Extensive nodularity throughout the LEFT retroperitoneum highly suspicious for disease recurrence and metastasis following recent LEFT nephro ureterectomy. Most suspicious area at the LEFT pelvic sidewall and bladder base.   Equivocal lymph nodes in the mediastinum. Suggest close attention on follow-up.   Generalized marrow uptake that could be seen with marrow stimulation. There is however an area at T11 with more focal characteristics, consider spinal MRI for further evaluation or short interval follow-up.   Nodular area along the RIGHT mediastinal border is of similar size and appearance dating back to 2019 and potentially related to scarring, suggest attention on follow-up.   Nephrolithiasis.   Aortic atherosclerosis.   Impression and Plan:  71 year old with:  1.  Advanced urothelial  carcinoma involving the distal left ureter diagnosed in April 2023.  He was found to have T3 tumor with high-grade disease and 70% squamous cell differentiation.  He developed nodularity and lymphadenopathy indicating advanced disease on PET scan in May 2023.   The disease status was updated at this time including reviewing his PET scan.  He is currently has suspicious evidence of disease outside of fall the surgery field as evident by his PET scan.  Systemic therapy is necessary at this time given the advanced nature of his cancer.  Without systemic therapy his disease can progress further and spread into distant metastatic regions.  This could be life-threatening which was discussed today with the patient including details.  Treatment options including gemcitabine and cisplatin which was reiterated.  He would be a reasonable cisplatin candidate despite his solitary kidney.  Alternative options such as carboplatin or combination of Padcev and Pembrolizumab could also be considered if he  has poor tolerance to platinum based therapy.  After discussion today, we opted to proceed with gemcitabine and cisplatin and we will update his staging scans after 3 cycles.  Switch maintenance to immunotherapy could be considered pending his response.  He is agreeable to proceed and we will schedule that for June based on his request.   2.  IV access: Risks and benefits of using Port-A-Cath versus peripheral veins was discussed today.  Complication associated with Port-A-Cath insertion were reiterated again.  He is agreeable to proceed.   3.  Antiemetics: Will be available to him.   4.  Renal function surveillance: Creatinine clearance continues to be within normal range and will be monitored on platinum therapy.   5.  Goals of care: Despite his advanced disease curative intent is indicated.  6.  Weight loss: Prescription for Megace will be available to him.  We also discussed strategies to improve his nutritional  intake.   7.  Follow-up:  will be in the next few weeks to follow his progress after repeat imaging studies.   40  minutes were spent on this encounter.  The time was dedicated to reviewing imaging studies, treatment options, addressing complications related to his cancer and cancer therapy.      Zola Button, MD 5/18/202312:30 PM

## 2021-10-15 NOTE — Progress Notes (Signed)
START ON PATHWAY REGIMEN - Bladder     A cycle is every 21 days:     Gemcitabine      Cisplatin   **Always confirm dose/schedule in your pharmacy ordering system**  Patient Characteristics: Advanced/Metastatic Disease, First Line, No Prior Platinum-Based Therapy, Good Renal Function (CrCl ? 60 mL/min) Therapeutic Status: Advanced/Metastatic Disease Line of Therapy: First Line Prior Platinum-Based Therapy<= No Renal Function: Good Renal Function (CrCl ? 60 mL/min) Intent of Therapy: Curative Intent, Discussed with Patient

## 2021-10-16 ENCOUNTER — Telehealth: Payer: Self-pay | Admitting: Oncology

## 2021-10-16 NOTE — Telephone Encounter (Signed)
Scheduled per 05/18 los, patient has been called and notified. 

## 2021-10-22 ENCOUNTER — Inpatient Hospital Stay: Payer: Medicare HMO

## 2021-10-22 ENCOUNTER — Other Ambulatory Visit: Payer: Self-pay

## 2021-10-23 ENCOUNTER — Other Ambulatory Visit: Payer: Self-pay | Admitting: Radiology

## 2021-10-23 DIAGNOSIS — C689 Malignant neoplasm of urinary organ, unspecified: Secondary | ICD-10-CM

## 2021-10-27 ENCOUNTER — Ambulatory Visit (HOSPITAL_COMMUNITY)
Admission: RE | Admit: 2021-10-27 | Discharge: 2021-10-27 | Disposition: A | Discharge status: Home / Self Care | Payer: Medicare HMO | Source: Ambulatory Visit | Attending: Oncology | Admitting: Oncology

## 2021-10-27 ENCOUNTER — Other Ambulatory Visit: Payer: Self-pay | Admitting: Oncology

## 2021-10-27 ENCOUNTER — Encounter (HOSPITAL_COMMUNITY): Payer: Self-pay

## 2021-10-27 ENCOUNTER — Other Ambulatory Visit: Payer: Self-pay

## 2021-10-27 DIAGNOSIS — C689 Malignant neoplasm of urinary organ, unspecified: Secondary | ICD-10-CM | POA: Diagnosis not present

## 2021-10-27 DIAGNOSIS — I480 Paroxysmal atrial fibrillation: Secondary | ICD-10-CM | POA: Insufficient documentation

## 2021-10-27 DIAGNOSIS — M797 Fibromyalgia: Secondary | ICD-10-CM | POA: Insufficient documentation

## 2021-10-27 DIAGNOSIS — C669 Malignant neoplasm of unspecified ureter: Secondary | ICD-10-CM

## 2021-10-27 DIAGNOSIS — Z452 Encounter for adjustment and management of vascular access device: Secondary | ICD-10-CM | POA: Diagnosis not present

## 2021-10-27 DIAGNOSIS — C662 Malignant neoplasm of left ureter: Secondary | ICD-10-CM | POA: Diagnosis not present

## 2021-10-27 DIAGNOSIS — Z87891 Personal history of nicotine dependence: Secondary | ICD-10-CM | POA: Diagnosis not present

## 2021-10-27 DIAGNOSIS — K219 Gastro-esophageal reflux disease without esophagitis: Secondary | ICD-10-CM | POA: Diagnosis not present

## 2021-10-27 DIAGNOSIS — I2510 Atherosclerotic heart disease of native coronary artery without angina pectoris: Secondary | ICD-10-CM | POA: Insufficient documentation

## 2021-10-27 DIAGNOSIS — J449 Chronic obstructive pulmonary disease, unspecified: Secondary | ICD-10-CM | POA: Diagnosis not present

## 2021-10-27 HISTORY — PX: IR IMAGING GUIDED PORT INSERTION: IMG5740

## 2021-10-27 MED ORDER — FENTANYL CITRATE (PF) 100 MCG/2ML IJ SOLN
INTRAMUSCULAR | Status: AC | PRN
  Administered 2021-10-27: 50 ug via INTRAVENOUS

## 2021-10-27 MED ORDER — LIDOCAINE HCL 1 % IJ SOLN
INTRAMUSCULAR | Status: AC
  Filled 2021-10-27: qty 20

## 2021-10-27 MED ORDER — LIDOCAINE HCL 1 % IJ SOLN
INTRAMUSCULAR | Status: AC | PRN
  Administered 2021-10-27: 10 mL via INTRADERMAL

## 2021-10-27 MED ORDER — MIDAZOLAM HCL 2 MG/2ML IJ SOLN
INTRAMUSCULAR | Status: AC | PRN
  Administered 2021-10-27: .5 mg via INTRAVENOUS

## 2021-10-27 MED ORDER — MIDAZOLAM HCL 2 MG/2ML IJ SOLN
INTRAMUSCULAR | Status: AC
  Filled 2021-10-27: qty 2

## 2021-10-27 MED ORDER — SODIUM CHLORIDE 0.9 % IV SOLN: INTRAVENOUS | Status: DC

## 2021-10-27 MED ORDER — HEPARIN SOD (PORK) LOCK FLUSH 100 UNIT/ML IV SOLN
INTRAVENOUS | Status: AC
  Filled 2021-10-27: qty 5

## 2021-10-27 MED ORDER — FENTANYL CITRATE (PF) 100 MCG/2ML IJ SOLN
INTRAMUSCULAR | Status: AC
  Filled 2021-10-27: qty 2

## 2021-10-27 MED ORDER — HEPARIN SOD (PORK) LOCK FLUSH 100 UNIT/ML IV SOLN
INTRAVENOUS | Status: AC | PRN
  Administered 2021-10-27: 500 [IU] via INTRAVENOUS

## 2021-10-27 NOTE — Consult Note (Signed)
Chief Complaint: Patient was seen in consultation today for  port a cath placement  Referring Physician(s): Wyatt Portela  Supervising Physician: Aletta Edouard  Patient Status: [facility] - Out-pt  History of Present Illness: Marvin Gill is a 71 y.o. 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  He is status post left nephroureterectomy on 09/01/2021.  He is scheduled today for Port-A-Cath placement to assist with treatment.  Past Medical History:  Diagnosis Date   Benign localized prostatic hyperplasia with lower urinary tract symptoms (LUTS)    Bladder cancer [facility]) urologsit-  dr winter   BCG treatment's    COPD with emphysema [facility])    Coronary artery disease primary cardiologist-- dr Arville Lime   a. s/p PTCA to dLAD 2000. b. Normal coronaries in 2003, patent prior PTCA site.   Dizziness    intermittant   Dysrhythmia 2019   AF-Eliquis started   Dysuria    Feeling light headed    intermittant when rolls over in bed in am last time 07-10-2021 resolves after sits up a few minutes per pt   Fibromyalgia    GERD (gastroesophageal reflux disease)    History of kidney stones    History of skin cancer    Hypothyroidism    OA (osteoarthritis)    back, shoulders , hips   PAF (paroxysmal atrial fibrillation) ([place]) 03/05/2018   dx in setting sepsis---  cardiologist-- dr Arville Lime (per dr Denman George note in epic pt was to stop eliquis due to hematuria last dose 09-05-2018)   PONV (postoperative nausea and vomiting)    NAUSEA ALL DAY AFTER 02-01-17 SURGERY, PT WANTS ANESTHESIA LIKE 06-23-15 SURGERY WITH DR  [name]   S/P percutaneous transluminal coronary angioplasty    05-18-1999  to dLAD   Wears glasses     Past Surgical History:  Procedure Laterality Date   CARDIAC CATHETERIZATION  06-14-2001  dr Einar Gip   normal coronary arteries  and patent previous dLAD    CARDIOVASCULAR STRESS TEST  06/12/2008   Low risk nuclear study w/ no evidence ischemia/  normal LV funciton and wall motion , ef 60%   COLONOSCOPY WITH PROPOFOL N/A 12/13/2013   Procedure: COLONOSCOPY WITH PROPOFOL;  Surgeon: Garlan Fair, MD;  Location: [facility];  Service: Endoscopy;  Laterality: N/A;   CORONARY ANGIOPLASTY  05-18-1999  dr Tami Ribas   PTCA balloon to dLAD 80% (single vessel disease),  ef 60%   CYSTOSCOPY WITH FULGERATION N/A 08/15/2017   Procedure: CYSTOSCOP/ BLADDER BIOPSY/TURBT;  Surgeon: Nickie Retort, MD;  Location: [facility];  Service: Urology;  Laterality: N/A;   CYSTOSCOPY WITH INSERTION OF UROLIFT N/A 06/23/2015   Procedure: CYSTOSCOPY WITH INSERTION OF UROLIFT;  Surgeon: Carolan Clines, MD;  Location: [facility];  Service: Urology;  Laterality: N/A;   CYSTOSCOPY WITH STENT PLACEMENT Left 04/21/2018   Procedure: CYSTOSCOPY WITH STENT PLACEMENT;  Surgeon: Bjorn Loser, MD;  Location: [place];  Service: Urology;  Laterality: Left;  Emergent case   CYSTOSCOPY WITH URETEROSCOPY AND STENT PLACEMENT Left 04/07/2018   Procedure: CYSTOSCOPY WITH URETEROSCOPY AND STENT EXCHANGE/ BLADDER BIOPSY/LASER ABLATION OF URETERAL TUMOR;  Surgeon: Ceasar Mons, MD;  Location: [facility];  Service: Urology;  Laterality: Left;  ONLY NEEDS 60 MIN   CYSTOSCOPY WITH URETEROSCOPY AND STENT PLACEMENT Left 11/28/2018   Procedure: CYSTOSCOPY WITH URETEROSCOPY ,  LEFT URETERAL AND BLADDER BIOPSY, LEFT STENT PLACEMENT;  Surgeon: Ceasar Mons, MD;  Location: [facility];  Service: Urology;  Laterality: Left;   CYSTOSCOPY WITH URETEROSCOPY AND STENT PLACEMENT Left 07/15/2021   Procedure: CYSTOSCOPY WITH URETEROSCOPY AND STENT PLACEMENT;  Surgeon: Ceasar Mons, MD;  Location: [facility];  Service: Urology;  Laterality: Left;  ONLY NEEDS 45 MIN    CYSTOSCOPY/URETEROSCOPY/HOLMIUM LASER/STENT PLACEMENT Bilateral 02/01/2017   Procedure: CYSTOSCOPY/URETEROSCOPY/HOLMIUM LASER/STENT PLACEMENT,STONE BASKETRY, BLADDER BIOPSY;  Surgeon: Nickie Retort, MD;  Location: [facility];  Service: Urology;  Laterality: Bilateral;   ESOPHAGOGASTRODUODENOSCOPY  08-24-2006   LEFT HEART CATH AND CORONARY ANGIOGRAPHY N/A 08/07/2020   Procedure: LEFT HEART CATH AND CORONARY ANGIOGRAPHY;  Surgeon: [name], Peter M, MD;  Location: [place] [facility];  Service: Cardiovascular;  Laterality: N/A;   ROBOT ASSITED LAPAROSCOPIC NEPHROURETERECTOMY Left 09/01/2021   Procedure: XI ROBOT ASSITED LAPAROSCOPIC NEPHROURETERECTOMY/ TRANSURETHRAL RESECTION OF LEFT URETERAL ORIFICE;  Surgeon: Ceasar Mons, MD;  Location: [facility];  Service: Urology;  Laterality: Left;   TRANSTHORACIC ECHOCARDIOGRAM  03/06/2018   mild LVH,  ef 55-60%,  pt in atrial fib. LV diastolic not evaluated/  mild LAE and RAE/  moderate AV calcification with sclerosis , no stenosis or regurg./  mild dilated aortic root, 52m/  trival MR and TR   TRANSURETHRAL INCISION OF PROSTATE N/A 04/01/2014   Procedure: TRANSURETHRAL INCISION OF THE PROSTATE (TUIP);  Surgeon: SAilene Rud MD;  [facility]  Service: Urology;  Laterality: N/A;    Allergies: Codeine, 12 hour decongestant [nasal spray], Duloxetine hcl, Oxycodone-acetaminophen, Pregabalin, and Vicodin hp [hydrocodone-acetaminophen]  Medications: Prior to Admission medications   Medication Sig Start Date End Date Taking? Authorizing Provider  acetaminophen (TYLENOL) 650 MG CR tablet Take 1,300 mg by mouth every 8 (eight) hours as needed for pain.    [provider]  ALPRAZolam (Duanne Moron 1 MG tablet Take 0.5-1 mg by mouth See admin instructions. Take 0.5 mg in the morning and 1 mg at night    [provider]  esomeprazole (NEXIUM) 40 MG capsule Take 40 mg by mouth daily.     [provider]  gabapentin (NEURONTIN) 800 MG tablet Take 800 mg by mouth 3 (three) times daily.  12/30/17   [provider]  levothyroxine (SYNTHROID, LEVOTHROID) 50 MCG tablet Take 1 tablet (50 mcg total) by mouth daily before breakfast. 03/10/18   SRaiford NobleLatif, DO  lidocaine-prilocaine (EMLA) cream Apply 1 application. topically as needed. 10/15/21   SWyatt Portela MD  megestrol (MEGACE) 40 MG/ML suspension SHAKE LIQUID AND TAKE 10 ML(400 MG) BY MOUTH DAILY 10/15/21   SWyatt Portela MD  nitroGLYCERIN (NITROSTAT) 0.4 MG SL tablet Place 1 tablet (0.4 mg total) under the tongue every 5 (five) minutes as needed for chest pain. 08/05/20   Nahser, PWonda Cheng MD  phenazopyridine (PYRIDIUM) 200 MG tablet Take 1 tablet (200 mg total) by mouth 3 (three) times daily as needed (for pain with urination). 07/15/21 07/15/22  WCeasar Mons MD  pravastatin (PRAVACHOL) 40 MG tablet Take 40 mg by mouth at bedtime.    [provider]  prochlorperazine (COMPAZINE) 10 MG tablet Take 1 tablet (10 mg total) by mouth every 6 (six) hours as needed for nausea or vomiting. 10/15/21   SWyatt Portela MD  sodium chloride (OCEAN) 0.65 % nasal spray Place 1 spray into the nose at bedtime as needed for congestion.  [provider]  tamsulosin (FLOMAX) 0.4 MG CAPS capsule Take 0.4 mg by mouth 2 (two) times daily. 04/02/18   [provider]  valACYclovir (VALTREX) 1000 MG tablet Take 1,000 mg by mouth daily as needed (fever blister).    [provider]     Family History  Problem Relation Age of Onset   Anuerysm Mother        Brain    Social History   Socioeconomic History   Marital status: Divorced    Spouse name: Not on file   Number of children: Not on file   Years of education: Not on file   Highest education level: Not on file  Occupational History   Not on file  Tobacco Use   Smoking status: Former    Packs/day: 1.00    Years: 25.00    Pack years:  25.00    Types: Cigarettes    Quit date: 06/01/2003    Years since quitting: 18.4   Smokeless tobacco: Never  Vaping Use   Vaping Use: Never used  Substance and Sexual Activity   Alcohol use: No   Drug use: No   Sexual activity: Not on file  Other Topics Concern   Not on file  Social History Narrative   Not on file   Social Determinants of Health   Financial Resource Strain: Not on file  Food Insecurity: Not on file  Transportation Needs: Not on file  Physical Activity: Not on file  Stress: Not on file  Social Connections: Not on file     Review of Systems denies fever, headache, chest pain, cough, abdominal pain, back pain, vomiting or bleeding.  He does have some dyspnea with exertion and intermittent nausea.  Vital Signs: pending   Physical Exam awake, alert.  Chest clear to auscultation bilaterally.  Heart with regular rate and rhythm.  Abdomen soft, positive bowel sounds, nontender.  No lower extremity edema  Imaging: NM PET Image Initial (PI) Skull Base To Thigh  Result Date: 10/11/2021 CLINICAL DATA:  Initial treatment strategy for post LEFT nephro ureterectomy for urothelial carcinoma, distal ureteral tumor. EXAM: NUCLEAR MEDICINE PET SKULL BASE TO THIGH TECHNIQUE: 9.5 mCi F-18 FDG was injected intravenously. Full-ring PET imaging was performed from the skull base to thigh after the radiotracer. CT data was obtained and used for attenuation correction and anatomic localization. Fasting blood glucose: 97 mg/dl COMPARISON:  July 09, 2021 FINDINGS: Mediastinal blood pool activity: SUV max 2.16 Liver activity: SUV max NA NECK: No hypermetabolic lymph nodes in the neck. Incidental CT findings: None CHEST: Nodularity along the medial RIGHT upper lobe along the mediastinal border (image 55/4) 11 x 10 mm previously 14 by 9 mm with bandlike changes in the craniocaudal dimension and more nodular appearance in the axial plane, maximum SUV of 2.83 along the inferior margin.  Scattered mediastinal lymph nodes (image 70/4) RIGHT paratracheal lymph node maximum SUV 3.68. Subcentimeter lymph node difficult to distinguish from vascular structures in the RIGHT hilum maximum SUV 4.52 (image 78/4) Mildly increased metabolic activity in subcarinal lymph nodes. LEFT axillary lymph node less than a cm (image 59/4) 2.85 maximum SUV. Incidental CT findings: Calcified aortic atherosclerosis. No aneurysmal dilation. No pericardial effusion. Normal caliber of central pulmonary vessels. Limited assessment of cardiovascular structures given lack of intravenous contrast. ABDOMEN/PELVIS: Extensive nodularity and nodal disease along the LEFT pelvis and LEFT retroperitoneum with marked increased metabolic activity. Largest area on image 184/4 measuring 3.1 x 2.0 cm showing a maximum SUV of  15.5 Extensive nodularity showing similar FDG uptake throughout the LEFT retroperitoneum tracking from the LEFT renal fossa along the anterior LEFT psoas into the LEFT pelvis. Also along the LEFT flank. (Image 168/4) 8 mm nodule with a maximum SUV of 9.1. (Image 120/4) maximum SUV of 7.6 without discrete nodularity above small bowel loops in the LEFT retroperitoneum. Tethering of the LEFT posterolateral urinary bladder due to the dominant area of soft tissue described above. (Image 167/4) 1.9 x 1.5 cm area with a maximum SUV 13.3. Hypermetabolic lymph node along LEFT external iliac chain (image 187/4) less than a cm. Nephro ureterectomy in this patient occurred on 09/01/2021. There is also nodular FDG uptake just deep to the fascia on image 175/4 measuring approximately 6 mm showing a maximum SUV of 12.63 Incidental CT findings: No acute findings relative to liver, gallbladder, pancreas, spleen, adrenal glands or kidneys. Large renal calculus on the RIGHT measuring 11 mm. Gas in the urinary bladder. Prostate unremarkable by CT. No acute bowel findings. Bowel has migrated into the LEFT nephrectomy bed. Appendix is normal.  Aortic atherosclerosis without aneurysm or adenopathy in the RIGHT pelvis or upper abdomen. SKELETON: Heterogeneity of marrow space uptake with generalized increase in the degree of FDG uptake throughout the marrow spaces in the spine. More focal uptake in T11 perhaps than at other levels of the spine without visible lesion on CT. Maximum SUV in this location 5.0. Maximum SUV in the lower lumbar spine at L3 of 3.94. Incidental CT findings: IMPRESSION: Extensive nodularity throughout the LEFT retroperitoneum highly suspicious for disease recurrence and metastasis following recent LEFT nephro ureterectomy. Most suspicious area at the LEFT pelvic sidewall and bladder base. Equivocal lymph nodes in the mediastinum. Suggest close attention on follow-up. Generalized marrow uptake that could be seen with marrow stimulation. There is however an area at T11 with more focal characteristics, consider spinal MRI for further evaluation or short interval follow-up. Nodular area along the RIGHT mediastinal border is of similar size and appearance dating back to 2019 and potentially related to scarring, suggest attention on follow-up. Nephrolithiasis. Aortic atherosclerosis. Electronically Signed   By: Zetta Bills M.D.   On: 10/11/2021 16:06    Labs:  CBC: Recent Labs    09/04/21 0429 10/08/21 0956 10/09/21 1739 10/10/21 0743  WBC 10.7* 13.4* 14.7* 14.5*  HGB 13.2 13.6 14.9 13.2  HCT 40.4 40.8 43.3 40.3  PLT 237 279 293 272    COAGS: Recent Labs    10/09/21 1739  INR 1.2    BMP: Recent Labs    09/04/21 0429 09/04/21 1041 10/08/21 0956 10/09/21 1739 10/10/21 0743  NA 137  --  138 139 137  K 3.6  --  4.1 3.8 3.6  CL 100  --  103 107 107  CO2 30  --  28 26 21*  GLUCOSE 98  --  108* 113* 102*  BUN 15  --  '15 16 17  '$ CALCIUM 8.9  --  9.6 9.5 9.1  CREATININE 1.46* 1.37* 1.25* 1.38* 1.23  GFRNONAA 51* 55* >60 54* >60    LIVER FUNCTION TESTS: Recent Labs    10/08/21 0956  BILITOT 0.7  AST  9*  ALT 5  ALKPHOS 87  PROT 7.6  ALBUMIN 4.0    TUMOR MARKERS: No results for input(s): AFPTM, CEA, CA199, CHROMGRNA in the last 8760 hours.  Assessment and Plan: 71 y.o. 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  He is status post left nephroureterectomy on 09/01/2021.  He is scheduled today for Port-A-Cath placement to assist with treatment.Risks and benefits of image guided port-a-catheter placement was discussed with the patient including, but not limited to bleeding, infection, pneumothorax, or fibrin sheath development and need for additional procedures.  All of the patient's questions were answered, patient is agreeable to proceed. Consent signed and in chart.    Thank you for this interesting consult.  I greatly enjoyed meeting Marvin Gill and look forward to participating in their care.  A copy of this report was sent to the requesting provider on this date.  Electronically Signed: D. Rowe Robert, PA-C 10/27/2021, 9:35 AM   I spent a total of  25 minutes   in face to face in clinical consultation, greater than 50% of which was counseling/coordinating care for Port-A-Cath placement

## 2021-10-27 NOTE — Discharge Instructions (Signed)
For questions /concerns may call Interventional Radiology at [phone]  You may remove your dressing and shower tomorrow afternoon  DO NOT use EMLA cream for 2 weeks after port placement as the cream will remove surgical glue on your incision.     Moderate Conscious Sedation, Adult, Care After This sheet gives you information about how to care for yourself after your procedure. Your health care provider may also give you more specific instructions. If you have problems or questions, contact your health careprovider. What can I expect after the procedure? After the procedure, it is common to have: Sleepiness for several hours. Impaired judgment for several hours. Difficulty with balance. Vomiting if you eat too soon. Follow these instructions at home: For the time period you were told by your health care provider: Rest. Do not participate in activities where you could fall or become injured. Do not drive or use machinery. Do not drink alcohol. Do not take sleeping pills or medicines that cause drowsiness. Do not make important decisions or sign legal documents. Do not take care of children on your own. Eating and drinking  Follow the diet recommended by your health care provider. Drink enough fluid to keep your urine pale yellow. If you vomit: Drink water, juice, or soup when you can drink without vomiting. Make sure you have little or no nausea before eating solid foods.  General instructions Take over-the-counter and prescription medicines only as told by your health care provider. Have a responsible adult stay with you for the time you are told. It is important to have someone help care for you until you are awake and alert. Do not smoke. Keep all follow-up visits as told by your health care provider. This is important. Contact a health care provider if: You are still sleepy or having trouble with balance after 24 hours. You feel light-headed. You keep feeling nauseous or  you keep vomiting. You develop a rash. You have a fever. You have redness or swelling around the IV site. Get help right away if: You have trouble breathing. You have new-onset confusion at home. Summary After the procedure, it is common to feel sleepy, have impaired judgment, or feel nauseous if you eat too soon. Rest after you get home. Know the things you should not do after the procedure. Follow the diet recommended by your health care provider and drink enough fluid to keep your urine pale yellow. Get help right away if you have trouble breathing or new-onset confusion at home. This information is not intended to replace advice given to you by your health care provider. Make sure you discuss any questions you have with your healthcare provider.  Implanted Port Insertion, Care After This sheet gives you information about how to care for yourself after your procedure. Your health care provider may also give you more specific instructions. If you have problems or questions, contact your health careprovider. What can I expect after the procedure? After the procedure, it is common to have: Discomfort at the port insertion site. Bruising on the skin over the port. This should improve over 3-4 days. Follow these instructions at home: Port care After your port is placed, you will get a manufacturer's information card. The card has information about your port. Keep this card with you at all times. Take care of the port as told by your health care provider. Ask your health care provider if you or a family member can get training for taking care of the port at home. A   home health care nurse may also take care of the port. Make sure to remember what type of port you have. Incision care Follow instructions from your health care provider about how to take care of your port insertion site. Make sure you: Wash your hands with soap and water before and after you change your bandage (dressing). If soap  and water are not available, use hand sanitizer. Change your dressing as told by your health care provider. Leave skin glue, or adhesive strips in place. These skin closures may need to stay in place for 2 weeks or longer.  Check your port insertion site every day for signs of infection. Check for:      - Redness, swelling, or pain.                     - Fluid or blood.      - Warmth.      - Pus or a bad smell. Activity Return to your normal activities as told by your health care provider. Ask your health care provider what activities are safe for you. Do not lift anything that is heavier than 10 lb (4.5 kg), or the limit that you are told, until your health care provider says that it is safe. General instructions Take over-the-counter and prescription medicines only as told by your health care provider. Do not take baths, swim, or use a hot tub until your health care provider approves. Ask your health care provider if you may take showers. You may only be allowed to take sponge baths. Do not drive for 24 hours if you were given a sedative during your procedure. Wear a medical alert bracelet in case of an emergency. This will tell any health care providers that you have a port. Keep all follow-up visits as told by your health care provider. This is important. Contact a health care provider if: You cannot flush your port with saline as directed, or you cannot draw blood from the port. You have a fever or chills. You have redness, swelling, or pain around your port insertion site. You have fluid or blood coming from your port insertion site. Your port insertion site feels warm to the touch. You have pus or a bad smell coming from the port insertion site. Get help right away if: You have chest pain or shortness of breath. You have bleeding from your port that you cannot control. Summary Take care of the port as told by your health care provider. Keep the manufacturer's information card with  you at all times. Change your dressing as told by your health care provider. Contact a health care provider if you have a fever or chills or if you have redness, swelling, or pain around your port insertion site. Keep all follow-up visits as told by your health care provider. This information is not intended to replace advice given to you by your health care provider. Make sure you discuss any questions you have with your healthcare provider. Document Revised: 12/13/2017 Document Reviewed: 12/13/2017  

## 2021-10-27 NOTE — Procedures (Signed)
Interventional Radiology Procedure Note  Procedure: Single Lumen Power Port Placement    Access:  Right IJ vein.  Findings: Catheter tip positioned at SVC/RA junction. Port is ready for immediate use.   Complications: None  EBL: < 10 mL  Recommendations:  - Ok to shower in 24 hours - Do not submerge for 7 days - Routine line care   Beldon Nowling T. Vincent Ehrler, M.D Pager:  [phone]   

## 2021-10-29 ENCOUNTER — Ambulatory Visit (HOSPITAL_COMMUNITY)
Admission: RE | Admit: 2021-10-29 | Discharge: 2021-10-29 | Disposition: A | Discharge status: Home / Self Care | Payer: Medicare HMO | Source: Ambulatory Visit | Attending: Urology | Admitting: Urology

## 2021-10-29 ENCOUNTER — Other Ambulatory Visit (HOSPITAL_COMMUNITY): Payer: Self-pay | Admitting: Urology

## 2021-10-29 DIAGNOSIS — R310 Gross hematuria: Secondary | ICD-10-CM | POA: Diagnosis not present

## 2021-10-29 DIAGNOSIS — R0602 Shortness of breath: Secondary | ICD-10-CM

## 2021-11-02 ENCOUNTER — Other Ambulatory Visit: Payer: Self-pay

## 2021-11-02 ENCOUNTER — Telehealth: Payer: Self-pay

## 2021-11-02 NOTE — Telephone Encounter (Signed)
Pt's daughter Paulina Fusi called today regarding regarding the pt's fatigue.  Hillary stated the pt is extremely fatigue, SOB on exertion, and have lost more than 50lbs.  Pt is currently taking Megace to increase his appetite and drinking Ensure.  Pt just recently had port placed and suppose to start chemotherapy on Monday of next week.  Pt and pt's daughter do not feel the pt is strong enough to start chemotherapy on Monday and would like to know if they could postpone treatment for 3 wks?  They also want to know if the tx is postponed would it be detrimental?  Informed Hillary that this RN will notify Dr. Alen Blew of their concerns.

## 2021-11-02 NOTE — Telephone Encounter (Signed)
Spoke with pt's daughter [name] regarding pt's shortness of breath and that he can barely walk.  Advised daughter pt needs to go to the ED for an evaluation but she declined.  Daughter became very upset and stated no one is doing anything to help her father.  She had spoke to [facility] earlier and was advised that a message was being sent to have Dr Alen Blew give the daughter a call.

## 2021-11-02 NOTE — Progress Notes (Addendum)
Pharmacist Chemotherapy Monitoring - Initial Assessment    Anticipated start date: 11/09/21   The following has been reviewed per standard work regarding the patient's treatment regimen: The patient's diagnosis, treatment plan and drug doses, and organ/hematologic function Lab orders and baseline tests specific to treatment regimen  The treatment plan start date, drug sequencing, and pre-medications Prior authorization status  Patient's documented medication list, including drug-drug interaction screen and prescriptions for anti-emetics and supportive care specific to the treatment regimen The drug concentrations, fluid compatibility, administration routes, and timing of the medications to be used The patient's access for treatment and lifetime cumulative dose history, if applicable  The patient's medication allergies and previous infusion related reactions, if applicable   Changes made to treatment plan:  N/A  Follow up needed:  [facility] on 11/09/21 for renal function   Acquanetta Belling, RPH, BCPS, BCOP 11/02/2021  2:44 PM

## 2021-11-04 ENCOUNTER — Other Ambulatory Visit: Payer: Self-pay

## 2021-11-04 ENCOUNTER — Encounter (HOSPITAL_COMMUNITY): Payer: Self-pay

## 2021-11-04 ENCOUNTER — Emergency Department (HOSPITAL_COMMUNITY): Payer: Medicare HMO

## 2021-11-04 ENCOUNTER — Inpatient Hospital Stay (HOSPITAL_COMMUNITY)
Admission: EM | Admit: 2021-11-04 | Discharge: 2021-11-13 | DRG: 291 | Disposition: A | Discharge status: Home Health | Payer: Medicare HMO | Attending: Internal Medicine | Admitting: Internal Medicine

## 2021-11-04 ENCOUNTER — Observation Stay (HOSPITAL_COMMUNITY): Payer: Medicare HMO

## 2021-11-04 DIAGNOSIS — J439 Emphysema, unspecified: Secondary | ICD-10-CM | POA: Diagnosis not present

## 2021-11-04 DIAGNOSIS — G8929 Other chronic pain: Secondary | ICD-10-CM | POA: Diagnosis present

## 2021-11-04 DIAGNOSIS — R0602 Shortness of breath: Secondary | ICD-10-CM | POA: Diagnosis not present

## 2021-11-04 DIAGNOSIS — K110 Atrophy of salivary gland: Secondary | ICD-10-CM | POA: Diagnosis not present

## 2021-11-04 DIAGNOSIS — K567 Ileus, unspecified: Secondary | ICD-10-CM | POA: Diagnosis present

## 2021-11-04 DIAGNOSIS — Z87442 Personal history of urinary calculi: Secondary | ICD-10-CM

## 2021-11-04 DIAGNOSIS — R0600 Dyspnea, unspecified: Secondary | ICD-10-CM | POA: Diagnosis present

## 2021-11-04 DIAGNOSIS — Z79890 Hormone replacement therapy (postmenopausal): Secondary | ICD-10-CM

## 2021-11-04 DIAGNOSIS — D490 Neoplasm of unspecified behavior of digestive system: Secondary | ICD-10-CM | POA: Diagnosis present

## 2021-11-04 DIAGNOSIS — D72829 Elevated white blood cell count, unspecified: Secondary | ICD-10-CM | POA: Diagnosis present

## 2021-11-04 DIAGNOSIS — I6521 Occlusion and stenosis of right carotid artery: Secondary | ICD-10-CM | POA: Diagnosis not present

## 2021-11-04 DIAGNOSIS — E876 Hypokalemia: Secondary | ICD-10-CM | POA: Diagnosis not present

## 2021-11-04 DIAGNOSIS — Z7901 Long term (current) use of anticoagulants: Secondary | ICD-10-CM

## 2021-11-04 DIAGNOSIS — C679 Malignant neoplasm of bladder, unspecified: Secondary | ICD-10-CM | POA: Diagnosis present

## 2021-11-04 DIAGNOSIS — E039 Hypothyroidism, unspecified: Secondary | ICD-10-CM | POA: Diagnosis present

## 2021-11-04 DIAGNOSIS — Z905 Acquired absence of kidney: Secondary | ICD-10-CM

## 2021-11-04 DIAGNOSIS — C662 Malignant neoplasm of left ureter: Secondary | ICD-10-CM | POA: Diagnosis present

## 2021-11-04 DIAGNOSIS — N400 Enlarged prostate without lower urinary tract symptoms: Secondary | ICD-10-CM | POA: Diagnosis present

## 2021-11-04 DIAGNOSIS — R627 Adult failure to thrive: Secondary | ICD-10-CM | POA: Diagnosis not present

## 2021-11-04 DIAGNOSIS — Z885 Allergy status to narcotic agent status: Secondary | ICD-10-CM

## 2021-11-04 DIAGNOSIS — R918 Other nonspecific abnormal finding of lung field: Secondary | ICD-10-CM | POA: Diagnosis not present

## 2021-11-04 DIAGNOSIS — Z79899 Other long term (current) drug therapy: Secondary | ICD-10-CM

## 2021-11-04 DIAGNOSIS — Z87891 Personal history of nicotine dependence: Secondary | ICD-10-CM

## 2021-11-04 DIAGNOSIS — Z9861 Coronary angioplasty status: Secondary | ICD-10-CM

## 2021-11-04 DIAGNOSIS — M47812 Spondylosis without myelopathy or radiculopathy, cervical region: Secondary | ICD-10-CM | POA: Diagnosis not present

## 2021-11-04 DIAGNOSIS — Z85828 Personal history of other malignant neoplasm of skin: Secondary | ICD-10-CM

## 2021-11-04 DIAGNOSIS — R296 Repeated falls: Secondary | ICD-10-CM | POA: Diagnosis present

## 2021-11-04 DIAGNOSIS — R0902 Hypoxemia: Secondary | ICD-10-CM | POA: Diagnosis not present

## 2021-11-04 DIAGNOSIS — I5031 Acute diastolic (congestive) heart failure: Secondary | ICD-10-CM | POA: Diagnosis not present

## 2021-11-04 DIAGNOSIS — Z888 Allergy status to other drugs, medicaments and biological substances status: Secondary | ICD-10-CM

## 2021-11-04 DIAGNOSIS — R9431 Abnormal electrocardiogram [ECG] [EKG]: Secondary | ICD-10-CM | POA: Diagnosis not present

## 2021-11-04 DIAGNOSIS — Z6820 Body mass index (BMI) 20.0-20.9, adult: Secondary | ICD-10-CM

## 2021-11-04 DIAGNOSIS — I472 Ventricular tachycardia, unspecified: Secondary | ICD-10-CM | POA: Diagnosis not present

## 2021-11-04 DIAGNOSIS — Z20822 Contact with and (suspected) exposure to covid-19: Secondary | ICD-10-CM | POA: Diagnosis present

## 2021-11-04 DIAGNOSIS — R0609 Other forms of dyspnea: Secondary | ICD-10-CM | POA: Diagnosis present

## 2021-11-04 DIAGNOSIS — I951 Orthostatic hypotension: Secondary | ICD-10-CM | POA: Diagnosis present

## 2021-11-04 DIAGNOSIS — Z8551 Personal history of malignant neoplasm of bladder: Secondary | ICD-10-CM

## 2021-11-04 DIAGNOSIS — I480 Paroxysmal atrial fibrillation: Secondary | ICD-10-CM | POA: Diagnosis present

## 2021-11-04 DIAGNOSIS — S0990XA Unspecified injury of head, initial encounter: Secondary | ICD-10-CM | POA: Diagnosis not present

## 2021-11-04 DIAGNOSIS — D72820 Lymphocytosis (symptomatic): Secondary | ICD-10-CM | POA: Diagnosis not present

## 2021-11-04 DIAGNOSIS — E43 Unspecified severe protein-calorie malnutrition: Secondary | ICD-10-CM | POA: Diagnosis present

## 2021-11-04 DIAGNOSIS — N401 Enlarged prostate with lower urinary tract symptoms: Secondary | ICD-10-CM | POA: Diagnosis present

## 2021-11-04 DIAGNOSIS — I2510 Atherosclerotic heart disease of native coronary artery without angina pectoris: Secondary | ICD-10-CM | POA: Diagnosis present

## 2021-11-04 DIAGNOSIS — J029 Acute pharyngitis, unspecified: Secondary | ICD-10-CM | POA: Diagnosis present

## 2021-11-04 DIAGNOSIS — K219 Gastro-esophageal reflux disease without esophagitis: Secondary | ICD-10-CM | POA: Diagnosis present

## 2021-11-04 DIAGNOSIS — E8809 Other disorders of plasma-protein metabolism, not elsewhere classified: Secondary | ICD-10-CM | POA: Diagnosis not present

## 2021-11-04 DIAGNOSIS — R5381 Other malaise: Secondary | ICD-10-CM | POA: Diagnosis present

## 2021-11-04 DIAGNOSIS — M797 Fibromyalgia: Secondary | ICD-10-CM | POA: Diagnosis present

## 2021-11-04 LAB — TSH: TSH: 2.424 u[IU]/mL (ref 0.350–4.500)

## 2021-11-04 LAB — CBC WITH DIFFERENTIAL/PLATELET
Abs Immature Granulocytes: 0.32 10*3/uL — ABNORMAL HIGH (ref 0.00–0.07)
Basophils Absolute: 0.1 10*3/uL (ref 0.0–0.1)
Basophils Relative: 0 %
Eosinophils Absolute: 0.1 10*3/uL (ref 0.0–0.5)
Eosinophils Relative: 0 %
HCT: 39.8 % (ref 39.0–52.0)
Hemoglobin: 13.2 g/dL (ref 13.0–17.0)
Immature Granulocytes: 1 %
Lymphocytes Relative: 6 %
Lymphs Abs: 2 10*3/uL (ref 0.7–4.0)
MCH: 28.6 pg (ref 26.0–34.0)
MCHC: 33.2 g/dL (ref 30.0–36.0)
MCV: 86.3 fL (ref 80.0–100.0)
Monocytes Absolute: 1.8 10*3/uL — ABNORMAL HIGH (ref 0.1–1.0)
Monocytes Relative: 6 %
Neutro Abs: 27.2 10*3/uL — ABNORMAL HIGH (ref 1.7–7.7)
Neutrophils Relative %: 87 %
Platelets: 413 10*3/uL — ABNORMAL HIGH (ref 150–400)
RBC: 4.61 MIL/uL (ref 4.22–5.81)
RDW: 14.6 % (ref 11.5–15.5)
WBC: 31.6 10*3/uL — ABNORMAL HIGH (ref 4.0–10.5)
nRBC: 0 % (ref 0.0–0.2)

## 2021-11-04 LAB — COMPREHENSIVE METABOLIC PANEL
ALT: 10 U/L (ref 0–44)
AST: 12 U/L — ABNORMAL LOW (ref 15–41)
Albumin: 3.5 g/dL (ref 3.5–5.0)
Alkaline Phosphatase: 108 U/L (ref 38–126)
Anion gap: 11 (ref 5–15)
BUN: 30 mg/dL — ABNORMAL HIGH (ref 8–23)
CO2: 21 mmol/L — ABNORMAL LOW (ref 22–32)
Calcium: 9.7 mg/dL (ref 8.9–10.3)
Chloride: 106 mmol/L (ref 98–111)
Creatinine, Ser: 1.24 mg/dL (ref 0.61–1.24)
GFR, Estimated: >60 mL/min (ref >60)
Glucose, Bld: 106 mg/dL — ABNORMAL HIGH (ref 70–99)
Potassium: 4 mmol/L (ref 3.5–5.1)
Sodium: 138 mmol/L (ref 135–145)
Total Bilirubin: 0.7 mg/dL (ref 0.3–1.2)
Total Protein: 8 g/dL (ref 6.5–8.1)

## 2021-11-04 LAB — URINALYSIS, ROUTINE W REFLEX MICROSCOPIC
Bilirubin Urine: NEGATIVE
Glucose, UA: NEGATIVE mg/dL
Ketones, ur: NEGATIVE mg/dL
Nitrite: NEGATIVE
Protein, ur: 100 mg/dL — AB
RBC / HPF: >50 RBC/hpf — ABNORMAL HIGH (ref 0–5)
Specific Gravity, Urine: 1.028 (ref 1.005–1.030)
WBC, UA: >50 WBC/hpf — ABNORMAL HIGH (ref 0–5)
pH: 5 (ref 5.0–8.0)

## 2021-11-04 LAB — D-DIMER, QUANTITATIVE: D-Dimer, Quant: 0.47 ug/mL-FEU (ref 0.00–0.50)

## 2021-11-04 LAB — LACTIC ACID, PLASMA
Lactic Acid, Venous: 1.1 mmol/L (ref 0.5–1.9)
Lactic Acid, Venous: 0.9 mmol/L (ref 0.5–1.9)

## 2021-11-04 LAB — TROPONIN I (HIGH SENSITIVITY)
Troponin I (High Sensitivity): 5 ng/L (ref <18)
Troponin I (High Sensitivity): 4 ng/L (ref <18)

## 2021-11-04 LAB — PROCALCITONIN: Procalcitonin: 0.16 ng/mL

## 2021-11-04 LAB — GROUP A STREP BY PCR: Group A Strep by PCR: NOT DETECTED

## 2021-11-04 MED ORDER — VANCOMYCIN HCL 1750 MG/350ML IV SOLN
1750.0000 mg | Freq: Once | INTRAVENOUS | Status: AC
  Administered 2021-11-04: 1750 mg via INTRAVENOUS
  Filled 2021-11-04: qty 350

## 2021-11-04 MED ORDER — ALPRAZOLAM 0.5 MG PO TABS
0.5000 mg | ORAL_TABLET | Freq: Every day | ORAL | Status: DC
  Administered 2021-11-05 – 2021-11-13 (×9): 0.5 mg via ORAL
  Filled 2021-11-04 (×9): qty 1

## 2021-11-04 MED ORDER — ENSURE ENLIVE PO LIQD
237.0000 mL | Freq: Two times a day (BID) | ORAL | Status: DC
  Administered 2021-11-05 – 2021-11-06 (×3): 237 mL via ORAL

## 2021-11-04 MED ORDER — SODIUM CHLORIDE 0.9 % IV SOLN: INTRAVENOUS | Status: DC

## 2021-11-04 MED ORDER — PHENAZOPYRIDINE HCL 100 MG PO TABS: 200.0000 mg | ORAL_TABLET | Freq: Three times a day (TID) | ORAL | Status: DC | PRN

## 2021-11-04 MED ORDER — LEVOTHYROXINE SODIUM 50 MCG PO TABS
50.0000 ug | ORAL_TABLET | Freq: Every day | ORAL | Status: DC
  Administered 2021-11-05 – 2021-11-13 (×9): 50 ug via ORAL
  Filled 2021-11-04 (×10): qty 1

## 2021-11-04 MED ORDER — NITROGLYCERIN 0.4 MG SL SUBL
0.4000 mg | SUBLINGUAL_TABLET | SUBLINGUAL | Status: DC | PRN
Start: 2021-11-04 — End: 2021-11-13

## 2021-11-04 MED ORDER — ALBUTEROL SULFATE (2.5 MG/3ML) 0.083% IN NEBU: 2.5000 mg | INHALATION_SOLUTION | RESPIRATORY_TRACT | Status: DC | PRN

## 2021-11-04 MED ORDER — ONDANSETRON HCL 4 MG PO TABS
4.0000 mg | ORAL_TABLET | Freq: Four times a day (QID) | ORAL | Status: DC | PRN
  Administered 2021-11-12: 4 mg via ORAL
  Filled 2021-11-04: qty 1

## 2021-11-04 MED ORDER — TAMSULOSIN HCL 0.4 MG PO CAPS
0.4000 mg | ORAL_CAPSULE | Freq: Two times a day (BID) | ORAL | Status: DC
  Administered 2021-11-04 – 2021-11-13 (×18): 0.4 mg via ORAL
  Filled 2021-11-04 (×18): qty 1

## 2021-11-04 MED ORDER — SODIUM CHLORIDE 0.9 % IV BOLUS
1000.0000 mL | Freq: Once | INTRAVENOUS | Status: AC
  Administered 2021-11-04: 1000 mL via INTRAVENOUS

## 2021-11-04 MED ORDER — ALPRAZOLAM 1 MG PO TABS
1.0000 mg | ORAL_TABLET | Freq: Every day | ORAL | Status: DC
  Administered 2021-11-04 – 2021-11-12 (×9): 1 mg via ORAL
  Filled 2021-11-04 (×9): qty 1

## 2021-11-04 MED ORDER — IOHEXOL 350 MG/ML SOLN
75.0000 mL | Freq: Once | INTRAVENOUS | Status: AC | PRN
  Administered 2021-11-04: 75 mL via INTRAVENOUS

## 2021-11-04 MED ORDER — PANTOPRAZOLE SODIUM 40 MG PO TBEC
40.0000 mg | DELAYED_RELEASE_TABLET | Freq: Two times a day (BID) | ORAL | Status: DC
  Administered 2021-11-04 – 2021-11-13 (×18): 40 mg via ORAL
  Filled 2021-11-04 (×18): qty 1

## 2021-11-04 MED ORDER — PRAVASTATIN SODIUM 40 MG PO TABS
40.0000 mg | ORAL_TABLET | Freq: Every day | ORAL | Status: DC
  Administered 2021-11-05 – 2021-11-13 (×9): 40 mg via ORAL
  Filled 2021-11-04: qty 2
  Filled 2021-11-04: qty 1
  Filled 2021-11-04 (×2): qty 2
  Filled 2021-11-04 (×4): qty 1
  Filled 2021-11-04: qty 2

## 2021-11-04 MED ORDER — DARIFENACIN HYDROBROMIDE ER 7.5 MG PO TB24
7.5000 mg | ORAL_TABLET | Freq: Every day | ORAL | Status: DC
  Administered 2021-11-05 – 2021-11-13 (×9): 7.5 mg via ORAL
  Filled 2021-11-04 (×9): qty 1

## 2021-11-04 MED ORDER — APIXABAN 5 MG PO TABS
5.0000 mg | ORAL_TABLET | Freq: Two times a day (BID) | ORAL | Status: DC
  Administered 2021-11-04 – 2021-11-13 (×18): 5 mg via ORAL
  Filled 2021-11-04 (×18): qty 1

## 2021-11-04 MED ORDER — SODIUM CHLORIDE 0.9 % IV SOLN
2.0000 g | INTRAVENOUS | Status: DC
  Administered 2021-11-04 – 2021-11-09 (×6): 2 g via INTRAVENOUS
  Filled 2021-11-04 (×6): qty 20

## 2021-11-04 MED ORDER — ONDANSETRON HCL 4 MG/2ML IJ SOLN: 4.0000 mg | Freq: Four times a day (QID) | INTRAMUSCULAR | Status: DC | PRN

## 2021-11-04 MED ORDER — PIPERACILLIN-TAZOBACTAM 3.375 G IVPB 30 MIN
3.3750 g | Freq: Once | INTRAVENOUS | Status: AC
  Administered 2021-11-04: 3.375 g via INTRAVENOUS
  Filled 2021-11-04: qty 50

## 2021-11-04 MED ORDER — GABAPENTIN 300 MG PO CAPS
600.0000 mg | ORAL_CAPSULE | Freq: Three times a day (TID) | ORAL | Status: DC
Start: 2021-11-04 — End: 2021-11-13
  Administered 2021-11-04 – 2021-11-13 (×27): 600 mg via ORAL
  Filled 2021-11-04 (×27): qty 2

## 2021-11-04 MED ORDER — ALPRAZOLAM 0.5 MG PO TABS: 0.5000 mg | ORAL_TABLET | ORAL | Status: DC

## 2021-11-04 MED ORDER — ACETAMINOPHEN 325 MG PO TABS
650.0000 mg | ORAL_TABLET | Freq: Four times a day (QID) | ORAL | Status: DC | PRN
Start: 2021-11-04 — End: 2021-11-13
  Administered 2021-11-04 – 2021-11-13 (×12): 650 mg via ORAL
  Filled 2021-11-04 (×13): qty 2

## 2021-11-04 MED ORDER — DOCUSATE SODIUM 100 MG PO CAPS
100.0000 mg | ORAL_CAPSULE | Freq: Two times a day (BID) | ORAL | Status: DC
Start: 2021-11-04 — End: 2021-11-13
  Administered 2021-11-04 – 2021-11-13 (×17): 100 mg via ORAL
  Filled 2021-11-04 (×17): qty 1

## 2021-11-04 NOTE — Progress Notes (Signed)
IV team consulted for PIV start. Family is upset, asked to speak to RN to explained why another PIV is needed. RN will re consult if family will permit PIV start by IV team.

## 2021-11-04 NOTE — Progress Notes (Signed)
PHARMACY -  BRIEF ANTIBIOTIC NOTE   Pharmacy has received consult(s) for vancomycin from an ED provider.  The patient's profile has been reviewed for ht/wt/allergies/indication/available labs.    One time order(s) placed for vancomycin 1750 mg  Further antibiotics/pharmacy consults should be ordered by admitting physician if indicated.                       Thank you,  Tawnya Crook, PharmD, BCPS Clinical Pharmacist 11/04/2021 3:45 PM

## 2021-11-04 NOTE — H&P (Signed)
History and Physical  Marvin Gill QMV:[id number] DOB: Oct 26, 1950 DOA: 11/04/2021  PCP: Lavone Orn, MD Patient coming from: Home   I have personally briefly reviewed patient's old medical records in [place]   Chief Complaint: Dyspnea on exertion, recurrent fall and syncope.   HPI: Marvin Gill is a 71 y.o. 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   He presents with progressive worsening shortness of breath on exertion over the last 2 months but getting progressively worse, dyspnea has progressed to minimal exertion, with just  few steps.  He denies chest pain.  He also report dizziness on standing and on ambulation.  He has fallen multiple times.  He passed out also multiple times per daughter, last episode on Sunday, he fell yesterday.   He also report 50 pounds weight loss.  Reports poor oral intake.  He has abdominal pain since after the surgery, not getting worse.  He has not felt the same since after the surgery.  He was off Eliquis for 2 weeks. He started to take eliquis  yesterday as recommended by his urology.  Eliquis was on hold due to hematuria.  He had an episode of bloody urine in the ED.   Evaluation in the ED: Sodium 138, potassium 4.0, BUN 30, creatinine 1.2, liver function test unremarkable, troponin 5, repeated at 4.  White blood cell 31. CT head: No acute intracranial abnormality. CT Soft tissue neck: No neck mass, adenopathy, or significant inflammatory changes. Chest x-ray: No active cardiopulmonary disease.  During orthostatic vitals performed in the ED patient oxygen saturation dropped from 99--to 69,.  Patient was having shortness  of breath at the moment.  CT angio order and pending.   Review of Systems: All systems reviewed and apart from history of presenting illness, are negative.  Past Medical History:  Diagnosis Date   Benign localized prostatic hyperplasia with lower urinary tract symptoms (LUTS)    Bladder cancer [facility]) urologsit-  dr winter   BCG treatment's    COPD with emphysema [facility])    Coronary artery disease primary cardiologist-- dr Arville Lime   a. s/p PTCA to dLAD 2000. b. Normal coronaries in 2003, patent prior PTCA site.   Dizziness    intermittant   Dysrhythmia 2019   AF-Eliquis started   Dysuria    Feeling light headed    intermittant when rolls over in bed in am last time 07-10-2021 resolves after sits up a few minutes per pt   Fibromyalgia    GERD (gastroesophageal reflux disease)    History of kidney stones    History of skin cancer    Hypothyroidism    OA (osteoarthritis)    back, shoulders , hips   PAF (paroxysmal atrial fibrillation) ([place]) 03/05/2018   dx in setting sepsis---  cardiologist-- dr Arville Lime (per dr Denman George note in epic pt was to stop eliquis due to hematuria last dose 09-05-2018)   PONV (postoperative nausea and vomiting)    NAUSEA ALL DAY AFTER 02-01-17 SURGERY, PT WANTS ANESTHESIA LIKE 06-23-15 SURGERY WITH DR  [name]   S/P percutaneous transluminal coronary angioplasty    05-18-1999  to dLAD   Wears glasses    Past Surgical  History:  Procedure Laterality Date   CARDIAC CATHETERIZATION  06-14-2001  dr Einar Gip   normal coronary arteries and patent previous dLAD    CARDIOVASCULAR STRESS TEST  06/12/2008   Low risk nuclear study w/ no evidence ischemia/  normal LV funciton and wall motion , ef 60%   COLONOSCOPY WITH PROPOFOL N/A 12/13/2013   Procedure: COLONOSCOPY WITH PROPOFOL;  Surgeon: Garlan Fair, MD;  Location: [facility];  Service: Endoscopy;  Laterality: N/A;   CORONARY ANGIOPLASTY  05-18-1999  dr Tami Ribas   PTCA balloon to dLAD 80% (single vessel  disease),  ef 60%   CYSTOSCOPY WITH FULGERATION N/A 08/15/2017   Procedure: CYSTOSCOP/ BLADDER BIOPSY/TURBT;  Surgeon: Nickie Retort, MD;  Location: [facility];  Service: Urology;  Laterality: N/A;   CYSTOSCOPY WITH INSERTION OF UROLIFT N/A 06/23/2015   Procedure: CYSTOSCOPY WITH INSERTION OF UROLIFT;  Surgeon: Carolan Clines, MD;  Location: [facility];  Service: Urology;  Laterality: N/A;   CYSTOSCOPY WITH STENT PLACEMENT Left 04/21/2018   Procedure: CYSTOSCOPY WITH STENT PLACEMENT;  Surgeon: Bjorn Loser, MD;  Location: [facility];  Service: Urology;  Laterality: Left;  Emergent case   CYSTOSCOPY WITH URETEROSCOPY AND STENT PLACEMENT Left 04/07/2018   Procedure: CYSTOSCOPY WITH URETEROSCOPY AND STENT EXCHANGE/ BLADDER BIOPSY/LASER ABLATION OF URETERAL TUMOR;  Surgeon: Ceasar Mons, MD;  Location: [facility];  Service: Urology;  Laterality: Left;  ONLY NEEDS 60 MIN   CYSTOSCOPY WITH URETEROSCOPY AND STENT PLACEMENT Left 11/28/2018   Procedure: CYSTOSCOPY WITH URETEROSCOPY , LEFT URETERAL AND BLADDER BIOPSY, LEFT STENT PLACEMENT;  Surgeon: Ceasar Mons, MD;  Location: [facility];  Service: Urology;  Laterality: Left;   CYSTOSCOPY WITH URETEROSCOPY AND STENT PLACEMENT Left 07/15/2021   Procedure: CYSTOSCOPY WITH URETEROSCOPY AND STENT PLACEMENT;  Surgeon: Ceasar Mons, MD;  Location: [facility];  Service: Urology;  Laterality: Left;  ONLY NEEDS 45 MIN   CYSTOSCOPY/URETEROSCOPY/HOLMIUM LASER/STENT PLACEMENT Bilateral 02/01/2017   Procedure: CYSTOSCOPY/URETEROSCOPY/HOLMIUM LASER/STENT PLACEMENT,STONE BASKETRY, BLADDER BIOPSY;  Surgeon: Nickie Retort, MD;  [facility];  Service: Urology;  Laterality: Bilateral;   ESOPHAGOGASTRODUODENOSCOPY  08-24-2006   IR IMAGING GUIDED PORT INSERTION  10/27/2021   LEFT HEART CATH AND CORONARY ANGIOGRAPHY N/A 08/07/2020   Procedure: LEFT  HEART CATH AND CORONARY ANGIOGRAPHY;  Surgeon: [name], Peter M, MD;  Location: [place] [facility];  Service: Cardiovascular;  Laterality: N/A;   ROBOT ASSITED LAPAROSCOPIC NEPHROURETERECTOMY Left 09/01/2021   Procedure: XI ROBOT ASSITED LAPAROSCOPIC NEPHROURETERECTOMY/ TRANSURETHRAL RESECTION OF LEFT URETERAL ORIFICE;  Surgeon: Ceasar Mons, MD;  Location: [facility];  Service: Urology;  Laterality: Left;   TRANSTHORACIC ECHOCARDIOGRAM  03/06/2018   mild LVH,  ef 55-60%,  pt in atrial fib. LV diastolic not evaluated/  mild LAE and RAE/  moderate AV calcification with sclerosis , no stenosis or regurg./  mild dilated aortic root, 36m/  trival MR and TR   TRANSURETHRAL INCISION OF PROSTATE N/A 04/01/2014   Procedure: TRANSURETHRAL INCISION OF THE PROSTATE (TUIP);  Surgeon: SAilene Rud MD;  [facility]  Service: Urology;  Laterality: N/A;   Social History:  reports that he quit smoking about 18 years ago. His smoking use included cigarettes. He has a 25.00 pack-year smoking history. He has never used smokeless tobacco. He reports that he does not drink alcohol and does not use drugs.   Allergies  Allergen Reactions   Codeine Nausea And Vomiting    "  made my head feel funny too"   12 Hour Decongestant [Nasal Spray]     Other reaction(s): Unknown   Duloxetine Hcl Nausea And Vomiting   Oxycodone-Acetaminophen Other (See Comments)    Hallucinations   Pregabalin     suicidal thoughts with lyrica   Vicodin Hp [Hydrocodone-Acetaminophen] Rash    Family History  Problem Relation Age of Onset   Anuerysm Mother        Brain    Prior to Admission medications   Medication Sig Start Date End Date Taking? Authorizing Provider  acetaminophen (TYLENOL) 650 MG CR tablet Take 1,300 mg by mouth every 8 (eight) hours as needed for pain.    [provider]  ALPRAZolam Duanne Moron) 1 MG tablet Take 0.5-1 mg by mouth See admin instructions. Take 0.5 mg in the  morning and 1 mg at night    [provider]  esomeprazole (NEXIUM) 40 MG capsule Take 40 mg by mouth daily.     [provider]  gabapentin (NEURONTIN) 800 MG tablet Take 800 mg by mouth 3 (three) times daily.  12/30/17   [provider]  levothyroxine (SYNTHROID, LEVOTHROID) 50 MCG tablet Take 1 tablet (50 mcg total) by mouth daily before breakfast. 03/10/18   Raiford Noble Latif, DO  lidocaine-prilocaine (EMLA) cream Apply 1 application. topically as needed. 10/15/21   Wyatt Portela, MD  megestrol (MEGACE) 40 MG/ML suspension SHAKE LIQUID AND TAKE 10 ML(400 MG) BY MOUTH DAILY 10/15/21   Wyatt Portela, MD  nitroGLYCERIN (NITROSTAT) 0.4 MG SL tablet Place 1 tablet (0.4 mg total) under the tongue every 5 (five) minutes as needed for chest pain. 08/05/20   Nahser, Wonda Cheng, MD  phenazopyridine (PYRIDIUM) 200 MG tablet Take 1 tablet (200 mg total) by mouth 3 (three) times daily as needed (for pain with urination). 07/15/21 07/15/22  Ceasar Mons, MD  pravastatin (PRAVACHOL) 40 MG tablet Take 40 mg by mouth at bedtime.    [provider]  prochlorperazine (COMPAZINE) 10 MG tablet Take 1 tablet (10 mg total) by mouth every 6 (six) hours as needed for nausea or vomiting. 10/15/21   Wyatt Portela, MD  sodium chloride (OCEAN) 0.65 % nasal spray Place 1 spray into the nose at bedtime as needed for congestion.    [provider]  tamsulosin (FLOMAX) 0.4 MG CAPS capsule Take 0.4 mg by mouth 2 (two) times daily. 04/02/18   [provider]  valACYclovir (VALTREX) 1000 MG tablet Take 1,000 mg by mouth daily as needed (fever blister).    [provider]   Physical Exam: Vitals:   11/04/21 1300 11/04/21 1400 11/04/21 1557 11/04/21 1600  BP: 119/81 123/82  109/75  Pulse: 92 82 88 80  Resp: (!) 21 20 (!) 21 (!) 23  Temp:      TempSrc:      SpO2: 99% 96% 97% 92%  Weight:      Height:        General exam: Moderately built and nourished  patient, lying comfortably supine on the gurney in no obvious distress. Head, eyes and ENT: Nontraumatic and normocephalic. Pupils equally reacting to light and accommodation. Oral mucosa moist. Neck: Supple. No JVD, carotid bruit or thyromegaly. Lymphatics: No lymphadenopathy. Respiratory system: Clear to auscultation. No increased work of breathing. Cardiovascular system: S1 and S2 heard, RRR. No JVD, murmurs, gallops, clicks or pedal edema. Gastrointestinal system: Abdomen is nondistended, soft and nontender. Normal bowel sounds heard. No organomegaly or masses appreciated. Central  nervous system: Alert and oriented. No focal neurological deficits. Extremities: Symmetric 5 x 5 power. Peripheral pulses symmetrically felt.  Skin: No rashes or acute findings. Cyanosis finger.  Musculoskeletal system: Negative exam. Psychiatry: Pleasant and cooperative.   Labs on Admission:  Basic Metabolic Panel: Recent Labs  Lab 11/04/21 1351  NA 138  K 4.0  CL 106  CO2 21*  GLUCOSE 106*  BUN 30*  CREATININE 1.24  CALCIUM 9.7   Liver Function Tests: Recent Labs  Lab 11/04/21 1351  AST 12*  ALT 10  ALKPHOS 108  BILITOT 0.7  PROT 8.0  ALBUMIN 3.5   No results for input(s): LIPASE, AMYLASE in the last 168 hours. No results for input(s): AMMONIA in the last 168 hours. CBC: Recent Labs  Lab 11/04/21 1351  WBC 31.6*  NEUTROABS 27.2*  HGB 13.2  HCT 39.8  MCV 86.3  PLT 413*   Cardiac Enzymes: No results for input(s): CKTOTAL, CKMB, CKMBINDEX, TROPONINI in the last 168 hours.  BNP (last 3 results) No results for input(s): PROBNP in the last 8760 hours. CBG: No results for input(s): GLUCAP in the last 168 hours.  Radiological Exams on Admission: DG Chest 2 View  Result Date: 11/04/2021 CLINICAL DATA:  Shortness of breath.  Nephrectomy last month. EXAM: CHEST - 2 VIEW COMPARISON:  Chest radiograph dated October 29, 2021 FINDINGS: The heart size and mediastinal contours are within  normal limits. Both lungs are clear. Right IJ access Port-A-Cath with distal tip in the SVC, unchanged. The visualized skeletal structures are unremarkable. IMPRESSION: No active cardiopulmonary disease. Electronically Signed   By: Keane Police D.O.   On: 11/04/2021 11:54   CT Head Wo Contrast  Result Date: 11/04/2021 CLINICAL DATA:  Head trauma, moderate-severe EXAM: CT HEAD WITHOUT CONTRAST TECHNIQUE: Contiguous axial images were obtained from the base of the skull through the vertex without intravenous contrast. RADIATION DOSE REDUCTION: This exam was performed according to the departmental dose-optimization program which includes automated exposure control, adjustment of the mA and/or kV according to patient size and/or use of iterative reconstruction technique. COMPARISON:  None Available. FINDINGS: Brain: There is no acute intracranial hemorrhage, mass effect, or edema. Gray-white differentiation is preserved. There is no extra-axial fluid collection. Prominence of the ventricles and sulci reflects mild parenchymal volume loss. Minimal patchy hypoattenuation in the supratentorial white matter is nonspecific but may reflect minor chronic microvascular ischemic changes. Vascular: There is atherosclerotic calcification at the skull base. Skull: Calvarium is unremarkable. Sinuses/Orbits: No acute finding. Other: None. IMPRESSION: No acute intracranial abnormality. Electronically Signed   By: Macy Mis M.D.   On: 11/04/2021 15:05   CT Soft Tissue Neck Wo Contrast  Result Date: 11/04/2021 CLINICAL DATA:  Epiglottitis or tonsillitis suspected; urothelial carcinoma EXAM: CT NECK WITHOUT CONTRAST TECHNIQUE: Multidetector CT imaging of the neck was performed following the standard protocol without intravenous contrast. RADIATION DOSE REDUCTION: This exam was performed according to the departmental dose-optimization program which includes automated exposure control, adjustment of the mA and/or kV according to  patient size and/or use of iterative reconstruction technique. COMPARISON:  None Available. FINDINGS: Pharynx and larynx: Unremarkable.  No mass or swelling. Salivary glands: Parotid and submandibular glands are atrophic but otherwise unremarkable. Thyroid: Normal. Lymph nodes: No enlarged or abnormal density nodes. Vascular: Minimal calcified plaque at the right common carotid bifurcation. Limited intracranial: Dictated separately. Visualized orbits: Unremarkable. Mastoids and visualized paranasal sinuses: No significant opacification. Skeleton: Cervical spine degenerative changes. Upper chest: Emphysema. Scarring at the lung apices.  Right chest wall port. IMPRESSION: No neck mass, adenopathy, or significant inflammatory changes. Electronically Signed   By: Macy Mis M.D.   On: 11/04/2021 15:13    EKG: Independently reviewed. Sinus Tachycardia.   Assessment/Plan Active Problems:   Benign localized prostatic hyperplasia with lower urinary tract symptoms (LUTS)   PAF (paroxysmal atrial fibrillation) ([facility])   Bladder cancer ([facility])   COPD with emphysema ([facility])   GERD (gastroesophageal reflux disease)   Hypothyroidism   Dyspnea on exertion   Leukocytosis   1-Dyspnea on exertion:  -He presents complaining of dyspnea on exertion, progressively getting worse.  -Oxygen sat drop from 99 to 70 during orthostatic vitals.  -He has been off Eliquis for 2 weeks. Just resume yesterday.  -Troponin negative times 2.  -Hb at 13.  -plan to proceed with CT angio rule out PE.  -Check ECHO.  -He will need Oxygen on ambulation.   2-Syncope, frequent fall. Suspect related to orthostatic Hypotension.  Suspect related to positive orthostatic hypotension. HR increase to 107 from 80. SBP 121 down to 95. CT head negative.  Plan to continue with IV fluids.  Suspect related to hypovolemia, dehydration.  Neuro exam non focal.  Check TSH, Cortisol level.   3-Leukocytosis, left shift.  Plan to rule out infection.   CT neck negative. Strep A negative.  Chest x ray negative.  UA pending, blood culture urin culture  Check pro-calcitonin.  Continue with IV antibiotics: IV ceftriaxone.  Received IV Vancomycin and Zosyn in the ED>   4-PAF;  Resume eliquis.  Monitor for hematuria.    5-T- 3 High grade  urothelial carcinoma of left distal ureter:  -Status post cystoscopy with left ureteral stent placement and TUR of the left  ureteral orifice, robotic assisted laparoscopic left nephro ureterectomy 09/01/2021 -Dr Alen Blew aware of admission.  -He has chronic abdominal pain after Surgery.  -If leukocytosis persist, pro-calcitonin elevated and no other source of infection is find, could consider CT abdomen pelvis.   Hypothyroidism; Continue with synthroid.   FTT;  Check cortisol level.  TSH.  Daughter is concern about high dose gabapentin. Plan to reduce gabapentin to 600 mg.   Sore throat:  Strept A negative.  Covid PCR 19 pending.   GERD;  PPI BID>   DVT Prophylaxis: Eliquis.  Code Status: Full code Family Communication: Care discussed with Daughter  Disposition Plan: Admit under observation for further evaluation of dyspnea and hydration.    Time spent: 75 minutes.   Elmarie Shiley MD [facility]   11/04/2021, 4:46 PM

## 2021-11-04 NOTE — ED Notes (Signed)
IV team at bedside. Daughter does not want another IV started at this time since the doctor has not been in to explain the need for this. This nurse explained the patient needed for this IV for the scan that is ordered. Daughter wants an explanation from the doctor.

## 2021-11-04 NOTE — ED Notes (Signed)
During Orthostatic signs, when laying down pts SpO2 level down significantly from 99-69, had to tell pt to take deep breathes, semi good pleth. When pt was ask to stand, the moment pt stood up, pts HR and pulse jumped to 100- 108 and was SOB. (Bp dropped) Staying standing, pt remained SOB, but was able to stay standing until ortho VS was complete.

## 2021-11-04 NOTE — ED Provider Notes (Signed)
Patient with significant leukocytosis and fatigue.  I spoke with Dr. Alen Blew who is his oncologist.  And he recommends patient to be admitted to medicine for sepsis work-up.  He also requests a CT angio of the chest.   Milton Ferguson, MD 11/04/21 [phone]

## 2021-11-04 NOTE — ED Provider Notes (Signed)
[place] DEPT Provider Note   CSN: [id number] Arrival date & time: 11/04/21  1108     History  Chief Complaint  Patient presents with   Shortness of Breath    KURON DOCKEN is a 71 y.o. male.  Patient with a history of urethral cell carcinoma status post nephrectomy about 2 months ago not yet on chemotherapy or radiation presenting with worsening fatigue, sore throat, dyspnea on exertion for the past several weeks, getting worse. Poor appetite at home with drinking ok. No fever, chills, nausea, vomiting. Feels lightheaded, no room spinning dizziness. Did have a fall yesterday due to feeling weak and losing balance. Restarted eliquis yesterday after being off for several weeks due to hematuria. Hematuria has resolved.  States he feels lightheaded, can only walk several feet before becoming short of breath and need to take a break due to shortness of breath and weakness.  Denies any focal deficits to his arms, legs, head or neck area.  No difficulty speaking or difficulty swallowing.  Family is concerned he is too weak to undergo chemotherapy which is being scheduled.  He is gone downhill ever since he had his nephrectomy.  The history is provided by the patient.  Shortness of Breath Associated symptoms: no abdominal pain, no chest pain, no cough, no fever, no rash and no vomiting       Home Medications Prior to Admission medications   Medication Sig Start Date End Date Taking? Authorizing Provider  acetaminophen (TYLENOL) 650 MG CR tablet Take 1,300 mg by mouth every 8 (eight) hours as needed for pain.    [provider]  ALPRAZolam Duanne Moron) 1 MG tablet Take 0.5-1 mg by mouth See admin instructions. Take 0.5 mg in the morning and 1 mg at night    [provider]  esomeprazole (NEXIUM) 40 MG capsule Take 40 mg by mouth daily.     [provider]  gabapentin (NEURONTIN) 800 MG tablet Take 800 mg by mouth 3 (three) times daily.   12/30/17   [provider]  levothyroxine (SYNTHROID, LEVOTHROID) 50 MCG tablet Take 1 tablet (50 mcg total) by mouth daily before breakfast. 03/10/18   Raiford Noble Latif, DO  lidocaine-prilocaine (EMLA) cream Apply 1 application. topically as needed. 10/15/21   Wyatt Portela, MD  megestrol (MEGACE) 40 MG/ML suspension SHAKE LIQUID AND TAKE 10 ML(400 MG) BY MOUTH DAILY 10/15/21   Wyatt Portela, MD  nitroGLYCERIN (NITROSTAT) 0.4 MG SL tablet Place 1 tablet (0.4 mg total) under the tongue every 5 (five) minutes as needed for chest pain. 08/05/20   Nahser, Wonda Cheng, MD  phenazopyridine (PYRIDIUM) 200 MG tablet Take 1 tablet (200 mg total) by mouth 3 (three) times daily as needed (for pain with urination). 07/15/21 07/15/22  Ceasar Mons, MD  pravastatin (PRAVACHOL) 40 MG tablet Take 40 mg by mouth at bedtime.    [provider]  prochlorperazine (COMPAZINE) 10 MG tablet Take 1 tablet (10 mg total) by mouth every 6 (six) hours as needed for nausea or vomiting. 10/15/21   Wyatt Portela, MD  sodium chloride (OCEAN) 0.65 % nasal spray Place 1 spray into the nose at bedtime as needed for congestion.    [provider]  tamsulosin (FLOMAX) 0.4 MG CAPS capsule Take 0.4 mg by mouth 2 (two) times daily. 04/02/18   [provider]  valACYclovir (VALTREX) 1000 MG tablet Take 1,000 mg by mouth daily as needed (fever blister).    [provider]      Allergies    Codeine, 12 hour decongestant [nasal spray], Duloxetine hcl, Oxycodone-acetaminophen, Pregabalin, and Vicodin hp [hydrocodone-acetaminophen]    Review of Systems   Review of Systems  Constitutional:  Positive for activity change, appetite change and fatigue. Negative for fever.  HENT:  Negative for congestion and rhinorrhea.   Respiratory:  Positive for shortness of breath. Negative for cough and chest tightness.   Cardiovascular:  Negative for chest pain.  Gastrointestinal:  Positive for nausea.  Negative for abdominal pain and vomiting.  Genitourinary:  Negative for dysuria and frequency.  Musculoskeletal:  Negative for arthralgias and myalgias.  Skin:  Negative for rash.  Neurological:  Positive for dizziness, weakness and light-headedness.   all other systems are negative except as noted in the HPI and PMH. \ Physical Exam Updated Vital Signs BP 115/81 (BP Location: Right Arm)   Pulse 99   Temp 98.2 F (36.8 C) (Oral)   Resp (!) 23   Ht '6\' 6"'$  (1.981 m)   Wt 81.6 kg   SpO2 96%   BMI 20.80 kg/m  Physical Exam Vitals and nursing note reviewed.  Constitutional:      General: He is not in acute distress.    Appearance: He is well-developed.     Comments: Chronically ill-appearing  HENT:     Head: Normocephalic and atraumatic.     Mouth/Throat:     Mouth: Mucous membranes are dry.     Pharynx: Posterior oropharyngeal erythema present. No oropharyngeal exudate.     Comments: Diffuse erythema, no asymmetry Eyes:     Conjunctiva/sclera: Conjunctivae normal.     Pupils: Pupils are equal, round, and reactive to light.  Neck:     Comments: No meningismus. Cardiovascular:     Rate and Rhythm: Normal rate and regular rhythm.     Heart sounds: Normal heart sounds. No murmur heard. Pulmonary:     Effort: Pulmonary effort is normal. No respiratory distress.     Breath sounds: Normal breath sounds.  Chest:     Chest wall: No tenderness.  Abdominal:     Palpations: Abdomen is soft.     Tenderness: There is no abdominal tenderness. There is no guarding or rebound.  Musculoskeletal:        General: No tenderness. Normal range of motion.     Cervical back: Normal range of motion and neck supple.  Skin:    General: Skin is warm.     Capillary Refill: Capillary refill takes less than 2 seconds.  Neurological:     General: No focal deficit present.     Mental Status: He is alert and oriented to person, place, and time. Mental status is at baseline.     Cranial Nerves: No  cranial nerve deficit.     Motor: No abnormal muscle tone.     Coordination: Coordination normal.     Comments:  5/5 strength throughout. CN 2-12 intact.Equal grip strength.   Psychiatric:        Behavior: Behavior normal.    ED Results / Procedures / Treatments   Labs (all labs ordered are listed, but only abnormal results are displayed) Labs Reviewed  CBC WITH DIFFERENTIAL/PLATELET - Abnormal; Notable for the following components:      Result Value   WBC 31.6 (*)    Platelets 413 (*)    Neutro Abs 27.2 (*)    Monocytes Absolute 1.8 (*)    Abs Immature Granulocytes 0.32 (*)  All other components within normal limits  COMPREHENSIVE METABOLIC PANEL - Abnormal; Notable for the following components:   CO2 21 (*)    Glucose, Bld 106 (*)    BUN 30 (*)    AST 12 (*)    All other components within normal limits  GROUP A STREP BY PCR  CULTURE, BLOOD (ROUTINE X 2)  CULTURE, BLOOD (ROUTINE X 2)  URINALYSIS, ROUTINE W REFLEX MICROSCOPIC  LACTIC ACID, PLASMA  LACTIC ACID, PLASMA  D-DIMER, QUANTITATIVE  TROPONIN I (HIGH SENSITIVITY)  TROPONIN I (HIGH SENSITIVITY)    EKG EKG Interpretation  Date/Time:  Wednesday November 04 2021 11:24:09 EDT Ventricular Rate:  98 PR Interval:  159 QRS Duration: 73 QT Interval:  331 QTC Calculation: 423 R Axis:   83 Text Interpretation: Sinus rhythm Borderline right axis deviation No significant change was found Confirmed by Ezequiel Essex [phone]) on 11/04/2021 2:29:44 PM  Radiology DG Chest 2 View  Result Date: 11/04/2021 CLINICAL DATA:  Shortness of breath.  Nephrectomy last month. EXAM: CHEST - 2 VIEW COMPARISON:  Chest radiograph dated October 29, 2021 FINDINGS: The heart size and mediastinal contours are within normal limits. Both lungs are clear. Right IJ access Port-A-Cath with distal tip in the SVC, unchanged. The visualized skeletal structures are unremarkable. IMPRESSION: No active cardiopulmonary disease. Electronically Signed   By: Keane Police D.O.   On: 11/04/2021 11:54   CT Head Wo Contrast  Result Date: 11/04/2021 CLINICAL DATA:  Head trauma, moderate-severe EXAM: CT HEAD WITHOUT CONTRAST TECHNIQUE: Contiguous axial images were obtained from the base of the skull through the vertex without intravenous contrast. RADIATION DOSE REDUCTION: This exam was performed according to the departmental dose-optimization program which includes automated exposure control, adjustment of the mA and/or kV according to patient size and/or use of iterative reconstruction technique. COMPARISON:  None Available. FINDINGS: Brain: There is no acute intracranial hemorrhage, mass effect, or edema. Gray-white differentiation is preserved. There is no extra-axial fluid collection. Prominence of the ventricles and sulci reflects mild parenchymal volume loss. Minimal patchy hypoattenuation in the supratentorial white matter is nonspecific but may reflect minor chronic microvascular ischemic changes. Vascular: There is atherosclerotic calcification at the skull base. Skull: Calvarium is unremarkable. Sinuses/Orbits: No acute finding. Other: None. IMPRESSION: No acute intracranial abnormality. Electronically Signed   By: Macy Mis M.D.   On: 11/04/2021 15:05   CT Soft Tissue Neck Wo Contrast  Result Date: 11/04/2021 CLINICAL DATA:  Epiglottitis or tonsillitis suspected; urothelial carcinoma EXAM: CT NECK WITHOUT CONTRAST TECHNIQUE: Multidetector CT imaging of the neck was performed following the standard protocol without intravenous contrast. RADIATION DOSE REDUCTION: This exam was performed according to the departmental dose-optimization program which includes automated exposure control, adjustment of the mA and/or kV according to patient size and/or use of iterative reconstruction technique. COMPARISON:  None Available. FINDINGS: Pharynx and larynx: Unremarkable.  No mass or swelling. Salivary glands: Parotid and submandibular glands are atrophic but otherwise  unremarkable. Thyroid: Normal. Lymph nodes: No enlarged or abnormal density nodes. Vascular: Minimal calcified plaque at the right common carotid bifurcation. Limited intracranial: Dictated separately. Visualized orbits: Unremarkable. Mastoids and visualized paranasal sinuses: No significant opacification. Skeleton: Cervical spine degenerative changes. Upper chest: Emphysema. Scarring at the lung apices. Right chest wall port. IMPRESSION: No neck mass, adenopathy, or significant inflammatory changes. Electronically Signed   By: Macy Mis M.D.   On: 11/04/2021 15:13    Procedures Procedures    Medications Ordered in ED Medications  sodium chloride 0.9 %  bolus 1,000 mL (has no administration in time range)    ED Course/ Medical Decision Making/ A&P                           Medical Decision Making Amount and/or Complexity of Data Reviewed Independent Historian: caregiver Labs: ordered. Decision-making details documented in ED Course. Radiology: ordered and independent interpretation performed. Decision-making details documented in ED Course. ECG/medicine tests: ordered and independent interpretation performed. Decision-making details documented in ED Course.  Risk Prescription drug management. Decision regarding hospitalization.  Anorexia, sore throat, shortness of breath, dyspnea with exertion, fatigue for the past several months.  Vital stable, no distress, no fever  Patient has stable vital signs and no distress.  He has no hypoxia or increased work of breathing.  Chest x-ray obtained in triage is negative and shows no infiltrate or pneumothorax, results reviewed and interpreted by me.  Hemoglobin is stable.  Creatinine is stable.  Family leery of giving patient IV contrast due to his solitary kidney.  This was accommodated.  Low suspicion for pulmonary embolism given his no hypoxia and no tachycardia.  He did restart his Eliquis yesterday.  Unclear etiology of patient's  leukocytosis.  Continues to complain of throat pain and poor p.o. intake. Awaiting urinalysis and strep test.  No abscess seen on CT neck or evidence of epiglottitis or deep space infection.  Orthostatics positive by HR. IVF given. Given significant leukocytosis, will obtain cultures and initiate antibiotics for concern for occult sepsis.  Awaiting urinalysis and call back from oncology. Plan admission for hydration and antibiotics and further evaluation.  Dr. Roderic Palau to assume care at shift change.           Final Clinical Impression(s) / ED Diagnoses Final diagnoses:  None    Rx / DC Orders ED Discharge Orders     None         Nimra Puccinelli, Annie Main, MD 11/04/21 1651

## 2021-11-04 NOTE — ED Triage Notes (Signed)
Pt has had increasing SHOB, fatigue, and sore throat. Pts family member reports pt had a nephrectomy last month and seems to have had increasing symptoms since then.

## 2021-11-05 ENCOUNTER — Observation Stay (HOSPITAL_COMMUNITY): Payer: Medicare HMO

## 2021-11-05 DIAGNOSIS — I2510 Atherosclerotic heart disease of native coronary artery without angina pectoris: Secondary | ICD-10-CM | POA: Diagnosis not present

## 2021-11-05 DIAGNOSIS — I480 Paroxysmal atrial fibrillation: Secondary | ICD-10-CM | POA: Diagnosis not present

## 2021-11-05 DIAGNOSIS — K2100 Gastro-esophageal reflux disease with esophagitis, without bleeding: Secondary | ICD-10-CM

## 2021-11-05 DIAGNOSIS — E038 Other specified hypothyroidism: Secondary | ICD-10-CM

## 2021-11-05 DIAGNOSIS — Z9861 Coronary angioplasty status: Secondary | ICD-10-CM | POA: Diagnosis not present

## 2021-11-05 DIAGNOSIS — C670 Malignant neoplasm of trigone of bladder: Secondary | ICD-10-CM

## 2021-11-05 DIAGNOSIS — J439 Emphysema, unspecified: Secondary | ICD-10-CM

## 2021-11-05 DIAGNOSIS — R296 Repeated falls: Secondary | ICD-10-CM | POA: Diagnosis not present

## 2021-11-05 DIAGNOSIS — N401 Enlarged prostate with lower urinary tract symptoms: Secondary | ICD-10-CM | POA: Diagnosis not present

## 2021-11-05 DIAGNOSIS — K6389 Other specified diseases of intestine: Secondary | ICD-10-CM | POA: Diagnosis not present

## 2021-11-05 DIAGNOSIS — I5031 Acute diastolic (congestive) heart failure: Secondary | ICD-10-CM | POA: Diagnosis not present

## 2021-11-05 DIAGNOSIS — N281 Cyst of kidney, acquired: Secondary | ICD-10-CM | POA: Diagnosis not present

## 2021-11-05 DIAGNOSIS — R0600 Dyspnea, unspecified: Secondary | ICD-10-CM

## 2021-11-05 DIAGNOSIS — Z20822 Contact with and (suspected) exposure to covid-19: Secondary | ICD-10-CM | POA: Diagnosis not present

## 2021-11-05 DIAGNOSIS — R627 Adult failure to thrive: Secondary | ICD-10-CM | POA: Diagnosis not present

## 2021-11-05 DIAGNOSIS — E8809 Other disorders of plasma-protein metabolism, not elsewhere classified: Secondary | ICD-10-CM | POA: Diagnosis not present

## 2021-11-05 DIAGNOSIS — D72829 Elevated white blood cell count, unspecified: Secondary | ICD-10-CM

## 2021-11-05 DIAGNOSIS — Z6820 Body mass index (BMI) 20.0-20.9, adult: Secondary | ICD-10-CM | POA: Diagnosis not present

## 2021-11-05 DIAGNOSIS — E43 Unspecified severe protein-calorie malnutrition: Secondary | ICD-10-CM | POA: Diagnosis not present

## 2021-11-05 DIAGNOSIS — I472 Ventricular tachycardia, unspecified: Secondary | ICD-10-CM | POA: Diagnosis not present

## 2021-11-05 DIAGNOSIS — E876 Hypokalemia: Secondary | ICD-10-CM | POA: Diagnosis not present

## 2021-11-05 DIAGNOSIS — Z7901 Long term (current) use of anticoagulants: Secondary | ICD-10-CM | POA: Diagnosis not present

## 2021-11-05 DIAGNOSIS — I951 Orthostatic hypotension: Secondary | ICD-10-CM | POA: Diagnosis not present

## 2021-11-05 DIAGNOSIS — R0609 Other forms of dyspnea: Secondary | ICD-10-CM

## 2021-11-05 DIAGNOSIS — C662 Malignant neoplasm of left ureter: Secondary | ICD-10-CM | POA: Diagnosis not present

## 2021-11-05 DIAGNOSIS — E039 Hypothyroidism, unspecified: Secondary | ICD-10-CM | POA: Diagnosis not present

## 2021-11-05 DIAGNOSIS — R0602 Shortness of breath: Secondary | ICD-10-CM | POA: Diagnosis not present

## 2021-11-05 DIAGNOSIS — Z885 Allergy status to narcotic agent status: Secondary | ICD-10-CM | POA: Diagnosis not present

## 2021-11-05 DIAGNOSIS — K567 Ileus, unspecified: Secondary | ICD-10-CM | POA: Diagnosis not present

## 2021-11-05 DIAGNOSIS — Z79890 Hormone replacement therapy (postmenopausal): Secondary | ICD-10-CM | POA: Diagnosis not present

## 2021-11-05 DIAGNOSIS — Z888 Allergy status to other drugs, medicaments and biological substances status: Secondary | ICD-10-CM | POA: Diagnosis not present

## 2021-11-05 DIAGNOSIS — K769 Liver disease, unspecified: Secondary | ICD-10-CM | POA: Diagnosis not present

## 2021-11-05 LAB — COMPREHENSIVE METABOLIC PANEL
ALT: 10 U/L (ref 0–44)
AST: 15 U/L (ref 15–41)
Albumin: 3 g/dL — ABNORMAL LOW (ref 3.5–5.0)
Alkaline Phosphatase: 97 U/L (ref 38–126)
Anion gap: 9 (ref 5–15)
BUN: 18 mg/dL (ref 8–23)
CO2: 20 mmol/L — ABNORMAL LOW (ref 22–32)
Calcium: 9 mg/dL (ref 8.9–10.3)
Chloride: 110 mmol/L (ref 98–111)
Creatinine, Ser: 1.01 mg/dL (ref 0.61–1.24)
GFR, Estimated: >60 mL/min (ref >60)
Glucose, Bld: 83 mg/dL (ref 70–99)
Potassium: 4.2 mmol/L (ref 3.5–5.1)
Sodium: 139 mmol/L (ref 135–145)
Total Bilirubin: 0.6 mg/dL (ref 0.3–1.2)
Total Protein: 7.1 g/dL (ref 6.5–8.1)

## 2021-11-05 LAB — ECHOCARDIOGRAM COMPLETE
AR max vel: 2.14 cm2
AV Area VTI: 1.99 cm2
AV Area mean vel: 2.16 cm2
AV Mean grad: 8 mmHg
AV Peak grad: 13.8 mmHg
Ao pk vel: 1.86 m/s
Area-P 1/2: 5.38 cm2
Calc EF: 57.6 %
Height: 78 in
S' Lateral: 2.7 cm
Single Plane A2C EF: 55.4 %
Single Plane A4C EF: 58.4 %
Weight: 2880 oz

## 2021-11-05 LAB — CBC
HCT: 35.7 % — ABNORMAL LOW (ref 39.0–52.0)
Hemoglobin: 12 g/dL — ABNORMAL LOW (ref 13.0–17.0)
MCH: 28.9 pg (ref 26.0–34.0)
MCHC: 33.6 g/dL (ref 30.0–36.0)
MCV: 86 fL (ref 80.0–100.0)
Platelets: 369 10*3/uL (ref 150–400)
RBC: 4.15 MIL/uL — ABNORMAL LOW (ref 4.22–5.81)
RDW: 14.6 % (ref 11.5–15.5)
WBC: 30.5 10*3/uL — ABNORMAL HIGH (ref 4.0–10.5)
nRBC: 0 % (ref 0.0–0.2)

## 2021-11-05 LAB — PROCALCITONIN: Procalcitonin: 0.3 ng/mL

## 2021-11-05 LAB — CORTISOL: Cortisol, Plasma: 11.4 ug/dL

## 2021-11-05 LAB — PHOSPHORUS: Phosphorus: 2.9 mg/dL (ref 2.5–4.6)

## 2021-11-05 LAB — MAGNESIUM: Magnesium: 1.7 mg/dL (ref 1.7–2.4)

## 2021-11-05 LAB — SARS CORONAVIRUS 2 BY RT PCR: SARS Coronavirus 2 by RT PCR: NEGATIVE

## 2021-11-05 MED ORDER — TRAMADOL HCL 50 MG PO TABS
50.0000 mg | ORAL_TABLET | Freq: Four times a day (QID) | ORAL | Status: DC | PRN
  Administered 2021-11-05 – 2021-11-12 (×9): 50 mg via ORAL
  Filled 2021-11-05 (×10): qty 1

## 2021-11-05 NOTE — Progress Notes (Signed)
PROGRESS NOTE    Marvin Gill  DGL:[id number] DOB: [birth year] DOA: 11/04/2021 PCP: Marvin Orn, MD     Brief Narrative:   71 y.o. 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    He presents with progressive worsening shortness of breath on exertion over the last 2 months but getting progressively worse, dyspnea has progressed to minimal exertion, with just  few steps.  He denies chest pain.  He also report dizziness on standing and on ambulation.  He has fallen multiple times.  He passed out also multiple times per daughter, last episode on Sunday, he fell yesterday.    He also report 50 pounds weight loss.  Reports poor oral intake.  He has abdominal pain since after the surgery, not getting worse.  He has not felt the same since after the surgery.   He was off Eliquis for 2 weeks. He started to take eliquis  yesterday as recommended by his urology.  Eliquis was on hold due to hematuria.  He had an episode of bloody urine in the ED.   Subjective: A/O x4, feels fatigued.  Mild abdominal pain with palpation.  States was scheduled to start chemotherapy next week on Tuesday   Assessment & Plan: Covid vaccination;   Principal Problem:   Dyspnea Active Problems:   Benign localized prostatic hyperplasia with lower urinary tract symptoms (LUTS)   PAF (paroxysmal atrial fibrillation) (HCC)   Bladder cancer (HCC)   COPD with emphysema (HCC)   GERD (gastroesophageal reflux disease)   Hypothyroidism   Dyspnea on exertion   Leukocytosis  Dyspnea on exertion:  -He presents complaining of dyspnea on exertion, progressively getting worse.  -Oxygen sat drop from 99 to 70 during orthostatic vitals.  -He has been off  Eliquis for 2 weeks. Just resume yesterday.  -Troponin negative times 2.  -Hb at 13.  -6/7 CT chest PE protocol negative for PE.  See results below.  NOTE will need repeat CT scan in 3 months -6/8 Echocardiogram pending.  -He will need Oxygen on ambulation.    Syncope, frequent fall. Suspect related to orthostatic Hypotension.  Suspect related to positive orthostatic hypotension. HR increase to 107 from 80. SBP 121 down to 95. CT head negative.  Plan to continue with IV fluids.  Suspect related to hypovolemia, dehydration.  Neuro exam non focal.  -6/7 TSH WNL -6/8 AM cortisol WNL -Orthostatic vitals every shift   PAF;  -Resume Eliquis.  -Strict in and out - Daily weight  Leukocytosis, left shift.  Plan to rule out infection.  CT neck negative. Strep A negative.  Chest x ray negative.  UA pending, blood culture urin culture  Check pro-calcitonin.  Continue with IV antibiotics: IV ceftriaxone.  Received IV Vancomycin and Zosyn in the ED>      5-T- 3 High grade  urothelial carcinoma of left distal ureter:  -Dr. Zola Gill oncology aware of patient's admission per admission note. -Status post cystoscopy with left ureteral stent placement and TUR of the left  ureteral orifice, robotic assisted laparoscopic left nephro ureterectomy 09/01/2021 -He has chronic abdominal pain after Surgery.  -If leukocytosis persist, pro-calcitonin elevated and no other source of infection is find, could consider CT abdomen pelvis.    Hypothyroidism;  -Continue  with synthroid.    FTT;  -Daughter is concern about high dose gabapentin. Plan to reduce gabapentin to 600 mg.    Sore throat:  -Strept A negative.  -6/8 resolved   GERD;  PPI BID>        Mobility Assessment (last 72 hours)     Mobility Assessment     Row Name 11/04/21 2000 11/04/21 1830         Does patient have an order for bedrest or is patient medically unstable No - Continue assessment No - Continue assessment       What is the highest level of mobility based on the progressive mobility assessment? Level 5 (Walks with assist in room/hall) - Balance while stepping forward/back and can walk in room with assist - Complete Level 5 (Walks with assist in room/hall) - Balance while stepping forward/back and can walk in room with assist - Complete                 Interdisciplinary Goals of Care Family Meeting   Date carried out: 11/05/2021  Location of the meeting:   Member's involved:   Durable Power of Tour manager:     Discussion: We discussed goals of care for Marvin Gill .   Code status:   Disposition:   Time spent for the meeting:     Marvin Bucio J, MD  11/05/2021, 7:43 AM         DVT prophylaxis: Eliquis Code Status: Full Family Communication:  Status is: Inpatient    Dispo: The patient is from: Home              Anticipated d/c is to: Home              Anticipated d/c date is: 3 days              Patient currently is not medically stable to d/c.      Consultants:    Procedures/Significant Events:  6/7 CTA chest PE protocol No evidence for pulmonary embolism. 2. New focal parenchymal opacity in the medial left lung apex measuring 1.6 x 0.9 x 1.8 cm. Findings are indeterminate and may represent infectious/inflammatory process Consider one of the following in 3 months for both low-risk and high-risk individuals: (a) repeat chest CT, (b) follow-up PET-CT, or (c) tissue sampling.   I have personally reviewed and interpreted all radiology studies and my findings are as above.  VENTILATOR SETTINGS:    Cultures 6/7 group A strep by PCR negative 6/7 blood pending 6/7 blood pending 6/7 urine pending 6/8 SARS coronavirus negative   Antimicrobials: Anti-infectives (From admission, onward)    Start     Dose/Rate Route Frequency Ordered Stop   11/04/21 2200  cefTRIAXone (ROCEPHIN) 2 g in sodium chloride 0.9 % 100 mL IVPB         2 g 200 mL/hr over 30 Minutes Intravenous Every 24 hours 11/04/21 1758     11/04/21 1545  piperacillin-tazobactam (ZOSYN) IVPB 3.375 g        3.375 g 100 mL/hr over 30 Minutes Intravenous  Once 11/04/21 1535 11/04/21 1642   11/04/21 1545  vancomycin (VANCOREADY) IVPB 1750 mg/350 mL        1,750 mg 175 mL/hr over 120 Minutes Intravenous  Once 11/04/21 1544 11/04/21 1847         Devices    LINES / TUBES:      Continuous Infusions:  sodium chloride 75 mL/hr at  11/04/21 1839   cefTRIAXone (ROCEPHIN)  IV 2 g (11/04/21 2117)     Objective: Vitals:   11/04/21 1820 11/04/21 2205 11/05/21 0202 11/05/21 0622  BP: (!) 147/80 105/64 120/72 139/78  Pulse: 84 81 83 84  Resp: '18 14 14 14  '$ Temp: 97.7 F (36.5 C) 98.4 F (36.9 C) 99.6 F (37.6 C) 98.5 F (36.9 C)  TempSrc: Oral Oral Oral Oral  SpO2: 100% 100% 100% 97%  Weight:      Height:        Intake/Output Summary (Last 24 hours) at 11/05/2021 0743 Last data filed at 11/05/2021 1017 Gross per 24 hour  Intake --  Output 1300 ml  Net -1300 ml   Filed Weights   11/04/21 1125  Weight: 81.6 kg    Examination:  General: A/O x4, No acute respiratory distress Eyes: negative scleral hemorrhage, negative anisocoria, negative icterus ENT: Negative Runny nose, negative gingival bleeding, Neck:  Negative scars, masses, torticollis, lymphadenopathy, JVD Lungs: Clear to auscultation bilaterally without wheezes or crackles Cardiovascular: Regular rate and rhythm without murmur gallop or rub normal S1 and S2 Abdomen: Mild abdominal pain with palpation, nondistended, positive soft, bowel sounds, no rebound, no ascites, no appreciable mass Extremities: No significant cyanosis, clubbing, or edema bilateral lower extremities Skin: Negative rashes, lesions, ulcers Psychiatric:  Negative depression, negative anxiety, negative fatigue, negative mania  Central nervous system:  Cranial nerves II through XII intact, tongue/uvula midline,  all extremities muscle strength 5/5, sensation intact throughout, negative dysarthria, negative expressive aphasia, negative receptive aphasia.  .     Data Reviewed: Care during the described time interval was provided by me .  I have reviewed this patient's available data, including medical history, events of note, physical examination, and all test results as part of my evaluation.  CBC: Recent Labs  Lab 11/04/21 1351  WBC 31.6*  NEUTROABS 27.2*  HGB 13.2  HCT 39.8  MCV 86.3  PLT 510*   Basic Metabolic Panel: Recent Labs  Lab 11/04/21 1351  NA 138  K 4.0  CL 106  CO2 21*  GLUCOSE 106*  BUN 30*  CREATININE 1.24  CALCIUM 9.7   GFR: Estimated Creatinine Clearance: 62.2 mL/min (by C-G formula based on SCr of 1.24 mg/dL). Liver Function Tests: Recent Labs  Lab 11/04/21 1351  AST 12*  ALT 10  ALKPHOS 108  BILITOT 0.7  PROT 8.0  ALBUMIN 3.5   No results for input(s): "LIPASE", "AMYLASE" in the last 168 hours. No results for input(s): "AMMONIA" in the last 168 hours. Coagulation Profile: No results for input(s): "INR", "PROTIME" in the last 168 hours. Cardiac Enzymes: No results for input(s): "CKTOTAL", "CKMB", "CKMBINDEX", "TROPONINI" in the last 168 hours. BNP (last 3 results) No results for input(s): "PROBNP" in the last 8760 hours. HbA1C: No results for input(s): "HGBA1C" in the last 72 hours. CBG: No results for input(s): "GLUCAP" in the last 168 hours. Lipid Profile: No results for input(s): "CHOL", "HDL", "LDLCALC", "TRIG", "CHOLHDL", "LDLDIRECT" in the last 72 hours. Thyroid Function Tests: Recent Labs    11/04/21 1928  TSH 2.424   Anemia Panel: No results for input(s): "VITAMINB12", "FOLATE", "FERRITIN", "TIBC", "IRON", "RETICCTPCT" in the last 72 hours. Sepsis Labs: Recent Labs  Lab 11/04/21 1804 11/04/21 1809 11/04/21 1928  PROCALCITON 0.16  --   --   LATICACIDVEN  --  1.1 0.9    Recent Results (from the past 240 hour(s))  Group A  Strep by PCR     Status: None  Collection Time: 11/04/21  3:07 PM   Specimen: Throat; Sterile Swab  Result Value Ref Range Status   Group A Strep by PCR NOT DETECTED NOT DETECTED Final    Comment: Performed at [facility], [place] [address] [place], [place] [address]         Radiology Studies: CT Angio Chest PE W and/or Wo Contrast  Result Date: 11/04/2021 CLINICAL DATA:  High probability for PE. EXAM: CT ANGIOGRAPHY CHEST WITH CONTRAST TECHNIQUE: Multidetector CT imaging of the chest was performed using the standard protocol during bolus administration of intravenous contrast. Multiplanar CT image reconstructions and MIPs were obtained to evaluate the vascular anatomy. RADIATION DOSE REDUCTION: This exam was performed according to the departmental dose-optimization program which includes automated exposure control, adjustment of the mA and/or kV according to patient size and/or use of iterative reconstruction technique. CONTRAST:  100m OMNIPAQUE IOHEXOL 350 MG/ML SOLN COMPARISON:  CT angiogram chest abdomen and pelvis 04/21/2018 FINDINGS: Cardiovascular: Satisfactory opacification of the pulmonary arteries to the segmental level. No evidence of pulmonary embolism. Normal heart size. No pericardial effusion. Right chest port catheter tip ends in the right atrium. Mediastinum/Nodes: No enlarged mediastinal, hilar, or axillary lymph nodes. Thyroid gland, trachea, and esophagus demonstrate no significant findings. Lungs/Pleura: There is a new focal parenchymal area of opacity abutting the pleural surface in the medial left lung apex. This area measures 1.6 x 0.9 by 1.8 cm. Otherwise, biapical scarring and pleural calcifications are unchanged. There are mild emphysematous changes are present. No pleural effusion or pneumothorax. Upper Abdomen: No acute abnormality. Musculoskeletal: No chest wall abnormality. No acute or significant osseous findings. Review of the MIP images confirms the  above findings. IMPRESSION: 1. No evidence for pulmonary embolism. 2. New focal parenchymal opacity in the medial left lung apex measuring 1.6 x 0.9 x 1.8 cm. Findings are indeterminate and may represent infectious/inflammatory process Consider one of the following in 3 months for both low-risk and high-risk individuals: (a) repeat chest CT, (b) follow-up PET-CT, or (c) tissue sampling. This recommendation follows the consensus statement: Guidelines for Management of Incidental Pulmonary Nodules Detected on CT Images: From the Fleischner Society 2017; Radiology 2017; 284:228-243. or neoplasm. Emphysema (ICD10-J43.9). Electronically Signed   By: ARonney AstersM.D.   On: 11/04/2021 19:20   CT Soft Tissue Neck Wo Contrast  Result Date: 11/04/2021 CLINICAL DATA:  Epiglottitis or tonsillitis suspected; urothelial carcinoma EXAM: CT NECK WITHOUT CONTRAST TECHNIQUE: Multidetector CT imaging of the neck was performed following the standard protocol without intravenous contrast. RADIATION DOSE REDUCTION: This exam was performed according to the departmental dose-optimization program which includes automated exposure control, adjustment of the mA and/or kV according to patient size and/or use of iterative reconstruction technique. COMPARISON:  None Available. FINDINGS: Pharynx and larynx: Unremarkable.  No mass or swelling. Salivary glands: Parotid and submandibular glands are atrophic but otherwise unremarkable. Thyroid: Normal. Lymph nodes: No enlarged or abnormal density nodes. Vascular: Minimal calcified plaque at the right common carotid bifurcation. Limited intracranial: Dictated separately. Visualized orbits: Unremarkable. Mastoids and visualized paranasal sinuses: No significant opacification. Skeleton: Cervical spine degenerative changes. Upper chest: Emphysema. Scarring at the lung apices. Right chest wall port. IMPRESSION: No neck mass, adenopathy, or significant inflammatory changes. Electronically Signed   By:  PMacy MisM.D.   On: 11/04/2021 15:13   CT Head Wo Contrast  Result Date: 11/04/2021 CLINICAL DATA:  Head trauma, moderate-severe EXAM: CT HEAD WITHOUT CONTRAST TECHNIQUE: Contiguous axial images were obtained from the base of  the skull through the vertex without intravenous contrast. RADIATION DOSE REDUCTION: This exam was performed according to the departmental dose-optimization program which includes automated exposure control, adjustment of the mA and/or kV according to patient size and/or use of iterative reconstruction technique. COMPARISON:  None Available. FINDINGS: Brain: There is no acute intracranial hemorrhage, mass effect, or edema. Gray-white differentiation is preserved. There is no extra-axial fluid collection. Prominence of the ventricles and sulci reflects mild parenchymal volume loss. Minimal patchy hypoattenuation in the supratentorial white matter is nonspecific but may reflect minor chronic microvascular ischemic changes. Vascular: There is atherosclerotic calcification at the skull base. Skull: Calvarium is unremarkable. Sinuses/Orbits: No acute finding. Other: None. IMPRESSION: No acute intracranial abnormality. Electronically Signed   By: Macy Mis M.D.   On: 11/04/2021 15:05   DG Chest 2 View  Result Date: 11/04/2021 CLINICAL DATA:  Shortness of breath.  Nephrectomy last month. EXAM: CHEST - 2 VIEW COMPARISON:  Chest radiograph dated October 29, 2021 FINDINGS: The heart size and mediastinal contours are within normal limits. Both lungs are clear. Right IJ access Port-A-Cath with distal tip in the SVC, unchanged. The visualized skeletal structures are unremarkable. IMPRESSION: No active cardiopulmonary disease. Electronically Signed   By: Keane Police D.O.   On: 11/04/2021 11:54        Scheduled Meds:  ALPRAZolam  1 mg Oral QHS   And   ALPRAZolam  0.5 mg Oral Daily   apixaban  5 mg Oral BID   darifenacin  7.5 mg Oral Daily   docusate sodium  100 mg Oral BID   feeding  supplement  237 mL Oral BID BM   gabapentin  600 mg Oral TID   levothyroxine  50 mcg Oral QAC breakfast   pantoprazole  40 mg Oral BID   pravastatin  40 mg Oral Daily   tamsulosin  0.4 mg Oral BID   Continuous Infusions:  sodium chloride 75 mL/hr at 11/04/21 1839   cefTRIAXone (ROCEPHIN)  IV 2 g (11/04/21 2117)     LOS: 0 days    Time spent:40 min    Lulubelle Simcoe, Geraldo Docker, MD [facility]   If 7PM-7AM, please contact night-coverage 11/05/2021, 7:43 AM

## 2021-11-05 NOTE — Progress Notes (Signed)
Chaplain engaged in an initial visit with Marvin Gill' daughter, Marvin Gill, by phone.  Chaplain attempted to call into Davit' room but there was no answer.  Chaplain also attempted to go by room but noticed the Airborne precaution measures in place.  Due to having to utilize witnesses who are not connected to [facility] or affiliated with the hospital, Chaplain will work to complete the [employer] or provide education when precautions.  Hillary agreed that we should wait.    Marvin Gill also voiced that she and her father have had some meaningful conversations around his wants and needs concerning his health which have included wanting to prolong his life with reasonable measures.  Marvin Gill is a Marine scientist and has been someone [name] trusts to aide in decision making. Chaplain voiced that because Marvin Gill is next-ok-kin, she is someone medical staff will look to if Burns is unable to speak for himself.   Chaplain offered support, education, and will follow-up.    11/05/21 1300  Clinical Encounter Type  Visited With Patient not available;Family  Visit Type Initial;Social support

## 2021-11-05 NOTE — Progress Notes (Signed)
2D Echocardiogram has been performed.  Marvin Gill 11/05/2021, 9:08 AM

## 2021-11-06 ENCOUNTER — Inpatient Hospital Stay (HOSPITAL_COMMUNITY): Payer: Medicare HMO

## 2021-11-06 DIAGNOSIS — N401 Enlarged prostate with lower urinary tract symptoms: Secondary | ICD-10-CM | POA: Diagnosis not present

## 2021-11-06 DIAGNOSIS — C670 Malignant neoplasm of trigone of bladder: Secondary | ICD-10-CM | POA: Diagnosis not present

## 2021-11-06 DIAGNOSIS — R0600 Dyspnea, unspecified: Secondary | ICD-10-CM | POA: Diagnosis not present

## 2021-11-06 DIAGNOSIS — R627 Adult failure to thrive: Secondary | ICD-10-CM | POA: Diagnosis not present

## 2021-11-06 DIAGNOSIS — C662 Malignant neoplasm of left ureter: Secondary | ICD-10-CM

## 2021-11-06 DIAGNOSIS — D72829 Elevated white blood cell count, unspecified: Secondary | ICD-10-CM | POA: Diagnosis not present

## 2021-11-06 LAB — LACTIC ACID, PLASMA
Lactic Acid, Venous: 1.1 mmol/L (ref 0.5–1.9)
Lactic Acid, Venous: 1.2 mmol/L (ref 0.5–1.9)
Lactic Acid, Venous: 0.8 mmol/L (ref 0.5–1.9)

## 2021-11-06 LAB — CBC WITH DIFFERENTIAL/PLATELET
Abs Immature Granulocytes: 0.27 10*3/uL — ABNORMAL HIGH (ref 0.00–0.07)
Abs Immature Granulocytes: 0.26 10*3/uL — ABNORMAL HIGH (ref 0.00–0.07)
Basophils Absolute: 0.1 10*3/uL (ref 0.0–0.1)
Basophils Absolute: 0.1 10*3/uL (ref 0.0–0.1)
Basophils Relative: 0 %
Basophils Relative: 0 %
Eosinophils Absolute: 0.1 10*3/uL (ref 0.0–0.5)
Eosinophils Absolute: 0.1 10*3/uL (ref 0.0–0.5)
Eosinophils Relative: 0 %
Eosinophils Relative: 0 %
HCT: 32.1 % — ABNORMAL LOW (ref 39.0–52.0)
HCT: 32.5 % — ABNORMAL LOW (ref 39.0–52.0)
Hemoglobin: 10.7 g/dL — ABNORMAL LOW (ref 13.0–17.0)
Hemoglobin: 10.7 g/dL — ABNORMAL LOW (ref 13.0–17.0)
Immature Granulocytes: 1 %
Immature Granulocytes: 1 %
Lymphocytes Relative: 8 %
Lymphocytes Relative: 5 %
Lymphs Abs: 2.3 10*3/uL (ref 0.7–4.0)
Lymphs Abs: 1.4 10*3/uL (ref 0.7–4.0)
MCH: 29 pg (ref 26.0–34.0)
MCH: 28.9 pg (ref 26.0–34.0)
MCHC: 33.3 g/dL (ref 30.0–36.0)
MCHC: 32.9 g/dL (ref 30.0–36.0)
MCV: 87 fL (ref 80.0–100.0)
MCV: 87.8 fL (ref 80.0–100.0)
Monocytes Absolute: 2 10*3/uL — ABNORMAL HIGH (ref 0.1–1.0)
Monocytes Absolute: 1.5 10*3/uL — ABNORMAL HIGH (ref 0.1–1.0)
Monocytes Relative: 7 %
Monocytes Relative: 5 %
Neutro Abs: 23.7 10*3/uL — ABNORMAL HIGH (ref 1.7–7.7)
Neutro Abs: 24.5 10*3/uL — ABNORMAL HIGH (ref 1.7–7.7)
Neutrophils Relative %: 84 %
Neutrophils Relative %: 89 %
Platelets: 349 10*3/uL (ref 150–400)
Platelets: 342 10*3/uL (ref 150–400)
RBC: 3.69 MIL/uL — ABNORMAL LOW (ref 4.22–5.81)
RBC: 3.7 MIL/uL — ABNORMAL LOW (ref 4.22–5.81)
RDW: 14.6 % (ref 11.5–15.5)
RDW: 14.7 % (ref 11.5–15.5)
WBC: 28.4 10*3/uL — ABNORMAL HIGH (ref 4.0–10.5)
WBC: 27.8 10*3/uL — ABNORMAL HIGH (ref 4.0–10.5)
nRBC: 0 % (ref 0.0–0.2)
nRBC: 0 % (ref 0.0–0.2)

## 2021-11-06 LAB — URINE CULTURE: Culture: <10000 — AB

## 2021-11-06 LAB — COMPREHENSIVE METABOLIC PANEL
ALT: 10 U/L (ref 0–44)
ALT: 11 U/L (ref 0–44)
AST: 12 U/L — ABNORMAL LOW (ref 15–41)
AST: 13 U/L — ABNORMAL LOW (ref 15–41)
Albumin: 2.9 g/dL — ABNORMAL LOW (ref 3.5–5.0)
Albumin: 2.8 g/dL — ABNORMAL LOW (ref 3.5–5.0)
Alkaline Phosphatase: 91 U/L (ref 38–126)
Alkaline Phosphatase: 96 U/L (ref 38–126)
Anion gap: 9 (ref 5–15)
Anion gap: 11 (ref 5–15)
BUN: 17 mg/dL (ref 8–23)
BUN: 19 mg/dL (ref 8–23)
CO2: 20 mmol/L — ABNORMAL LOW (ref 22–32)
CO2: 18 mmol/L — ABNORMAL LOW (ref 22–32)
Calcium: 8.9 mg/dL (ref 8.9–10.3)
Calcium: 8.8 mg/dL — ABNORMAL LOW (ref 8.9–10.3)
Chloride: 109 mmol/L (ref 98–111)
Chloride: 105 mmol/L (ref 98–111)
Creatinine, Ser: 1 mg/dL (ref 0.61–1.24)
Creatinine, Ser: 1.03 mg/dL (ref 0.61–1.24)
GFR, Estimated: >60 mL/min (ref >60)
GFR, Estimated: >60 mL/min (ref >60)
Glucose, Bld: 95 mg/dL (ref 70–99)
Glucose, Bld: 100 mg/dL — ABNORMAL HIGH (ref 70–99)
Potassium: 4.1 mmol/L (ref 3.5–5.1)
Potassium: 3.8 mmol/L (ref 3.5–5.1)
Sodium: 138 mmol/L (ref 135–145)
Sodium: 134 mmol/L — ABNORMAL LOW (ref 135–145)
Total Bilirubin: 0.7 mg/dL (ref 0.3–1.2)
Total Bilirubin: 0.7 mg/dL (ref 0.3–1.2)
Total Protein: 6.7 g/dL (ref 6.5–8.1)
Total Protein: 6.5 g/dL (ref 6.5–8.1)

## 2021-11-06 LAB — PROCALCITONIN: Procalcitonin: 0.39 ng/mL

## 2021-11-06 LAB — MAGNESIUM
Magnesium: 1.9 mg/dL (ref 1.7–2.4)
Magnesium: 1.7 mg/dL (ref 1.7–2.4)

## 2021-11-06 LAB — PHOSPHORUS
Phosphorus: 3.9 mg/dL (ref 2.5–4.6)
Phosphorus: 2.6 mg/dL (ref 2.5–4.6)

## 2021-11-06 LAB — D-DIMER, QUANTITATIVE: D-Dimer, Quant: 1.09 ug/mL-FEU — ABNORMAL HIGH (ref 0.00–0.50)

## 2021-11-06 MED ORDER — ADULT MULTIVITAMIN W/MINERALS CH
1.0000 | ORAL_TABLET | Freq: Every day | ORAL | Status: DC
  Administered 2021-11-06 – 2021-11-13 (×8): 1 via ORAL
  Filled 2021-11-06 (×9): qty 1

## 2021-11-06 MED ORDER — ENSURE ENLIVE PO LIQD
237.0000 mL | Freq: Four times a day (QID) | ORAL | Status: DC
  Administered 2021-11-06 – 2021-11-12 (×7): 237 mL via ORAL

## 2021-11-06 MED FILL — Dexamethasone Sodium Phosphate Inj 100 MG/10ML: INTRAMUSCULAR | Qty: 1 | Status: AC

## 2021-11-06 MED FILL — Fosaprepitant Dimeglumine For IV Infusion 150 MG (Base Eq): INTRAVENOUS | Qty: 5 | Status: AC

## 2021-11-06 NOTE — Discharge Instructions (Signed)

## 2021-11-06 NOTE — Progress Notes (Signed)
Initial Nutrition Assessment  DOCUMENTATION CODES:   Severe malnutrition in context of chronic illness  INTERVENTION:   -Ensure Plus High Protein po QID, each supplement provides 350 kcal and 20 grams of protein.   -Multivitamin with minerals daily  -Placed "High Calorie, High Protein" handout in AVS  NUTRITION DIAGNOSIS:   Severe Malnutrition related to chronic illness, cancer and cancer related treatments as evidenced by percent weight loss, severe muscle depletion, mild fat depletion.   GOAL:   Patient will meet greater than or equal to 90% of their needs  MONITOR:   PO intake, Supplement acceptance, Labs, Weight trends, I & O's  REASON FOR ASSESSMENT:   Malnutrition Screening Tool    ASSESSMENT:   71 y.o. 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  Patient in room, reports he has not been eating well since his surgery 2 months ago (4/4). Pt has had a sore throat nad no appetite. States he did eat 75% of  a sausage biscuit from [employer] this morning. At home he does drink Ensure. States he was recently started on an appetite stimulant which he thought helped, couldn't name the medication.  Will order Ensure and daily MVI.  Per weight records, pt has lost 20 lbs since  2/27 (9% wt loss x 3.5 months, significant for time frame).  Medications: Colace  Labs reviewed.  NUTRITION - FOCUSED PHYSICAL EXAM:  Flowsheet Row Most Recent Value  Orbital Region Mild depletion  Upper Arm Region No depletion  Thoracic and Lumbar Region No depletion  Buccal Region Mild depletion  Temple Region Mild depletion  Clavicle Bone Region Severe depletion  Clavicle and Acromion Bone Region Severe depletion   Scapular Bone Region Severe depletion  Dorsal Hand No depletion  Patellar Region Moderate depletion  Anterior Thigh Region Moderate depletion  Posterior Calf Region Moderate depletion  Edema (RD Assessment) None  Hair Reviewed  Eyes Reviewed  Mouth Reviewed  Skin Reviewed       Diet Order:   Diet Order             Diet regular Room service appropriate? Yes; Fluid consistency: Thin  Diet effective now                   EDUCATION NEEDS:   Education needs have been addressed  Skin:  Skin Assessment: Reviewed RN Assessment  Last BM:  PTA  Height:   Ht Readings from Last 1 Encounters:  11/04/21 '6\' 6"'$  (1.981 m)    Weight:   Wt Readings from Last 1 Encounters:  11/06/21 82.4 kg    BMI:  Body mass index is 20.99 kg/m.  Estimated Nutritional Needs:   Kcal:  2300-2500  Protein:  110-120g  Fluid:  2.3L/day  Clayton Bibles, MS, RD, LDN Inpatient Clinical Dietitian Contact information available via Amion

## 2021-11-06 NOTE — Progress Notes (Signed)
Chaplain attempted to assist Dhaval in filling out his HCPOA and AD paperwork, but he seemed somewhat confused and was difficult to understand. He provided the wrong date for his birthdate. This Probation officer did not feel that he was sufficiently oriented to complete paperwork at this time.  If he is more oriented on Monday, we are glad to try again.  He has the paperwork and can fill it out in his time and have it notarized outside of the hospital if he discharges before then.  [address], [place], [phone]

## 2021-11-06 NOTE — Progress Notes (Signed)
Transition of Care (TOC) Screening Note  Patient Details  Name: Marvin Gill Date of Birth: 03-23-1951  Transition of Care [facility]) CM/SW Contact:    Sherie Don, LCSW Phone Number: 11/06/2021, 10:52 AM  Transition of Care Department [facility]) has reviewed patient and no TOC needs have been identified at this time. We will continue to monitor patient advancement through interdisciplinary progression rounds. If new patient transition needs arise, please place a TOC consult.

## 2021-11-06 NOTE — Progress Notes (Signed)
PROGRESS NOTE    Marvin Gill  ONG:[id number] DOB: 1950-08-21 DOA: 11/04/2021 PCP: Marvin Orn, MD     Brief Narrative:   71 y.o. 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    He presents with progressive worsening shortness of breath on exertion over the last 2 months but getting progressively worse, dyspnea has progressed to minimal exertion, with just  few steps.  He denies chest pain.  He also report dizziness on standing and on ambulation.  He has fallen multiple times.  He passed out also multiple times per daughter, last episode on Sunday, he fell yesterday.    He also report 50 pounds weight loss.  Reports poor oral intake.  He has abdominal pain since after the surgery, not getting worse.  He has not felt the same since after the surgery.   He was off Eliquis for 2 weeks. He started to take eliquis  yesterday as recommended by his urology.  Eliquis was on hold due to hematuria.  He had an episode of bloody urine in the ED.   Subjective: 6/9 overnight Tmax 38.1 C.  Paged to bedside patient positive SOB, positive rigors.      Assessment & Plan: Covid vaccination;   Principal Problem:   Dyspnea Active Problems:   Benign localized prostatic hyperplasia with lower urinary tract symptoms (LUTS)   PAF (paroxysmal atrial fibrillation) (HCC)   Bladder cancer (HCC)   COPD with emphysema (HCC)   GERD (gastroesophageal reflux disease)   Hypothyroidism   Dyspnea on exertion   Leukocytosis  Dyspnea on exertion:  -He presents complaining of dyspnea on exertion, progressively getting worse.  -Oxygen sat drop from 99 to 70 during orthostatic vitals.  -He has been off Eliquis for 2 weeks. Just resume  yesterday.  -Troponin negative times 2.  -Hb at 13.  -6/7 CT chest PE protocol negative for PE.  See results below.  NOTE will need repeat CT scan in 3 months -6/8 Echocardiogram pending.  -He will need Oxygen on ambulation.    Syncope, frequent fall. Suspect related to orthostatic Hypotension.  Suspect related to positive orthostatic hypotension. HR increase to 107 from 80. SBP 121 down to 95. CT head negative.  Plan to continue with IV fluids.  Suspect related to hypovolemia, dehydration.  Neuro exam non focal.  -6/7 TSH WNL -6/8 AM cortisol WNL -Orthostatic vitals every shift  Acute Diastolic CHF - 6/9 see echocardiogram below -Strict in and out -392.54m - Daily weight Filed Weights   11/04/21 1125 11/06/21 0553  Weight: 81.6 kg 82.4 kg     PAF;  -Resume Eliquis.   Leukocytosis, left shift.  Lab Results  Component Value Date   WBC 28.4 (H) 11/06/2021   WBC 30.5 (H) 11/05/2021   WBC 31.6 (H) 11/04/2021   WBC 14.5 (H) 10/10/2021   WBC 14.7 (H) 10/09/2021   -6/9 PCXR negative -6/9 UA pending, blood culture urin culture  -6/9 check pro-calcitonin.  -Continue with IV antibiotics: IV ceftriaxone.  -6/9 plan to rule out infection.      5-T- 3 High grade  urothelial carcinoma of left distal ureter:  -Dr. FZola Buttononcology aware of patient's admission per admission note. -Status post cystoscopy with left ureteral stent placement and TUR  of the left  ureteral orifice, robotic assisted laparoscopic left nephro ureterectomy 09/01/2021 -He has chronic abdominal pain after Surgery.  -If leukocytosis persist, pro-calcitonin elevated and no other source of infection is find, could consider CT abdomen pelvis.  -6/9 obtain CT abdomen pelvis in A.m. if urine and blood cultures negative   Hypothyroidism;  -Synthroid 50 mcg daily.    FTT;  -Daughter is concern about high dose gabapentin. Plan to reduce gabapentin to 600 mg.    Sore throat:  -Strept A negative.  -6/8  resolved   GERD;  PPI BID>    Severe protein calorie malnutrition - 6/9 continue feeding supplements per nutrition recommendation    Mobility Assessment (last 72 hours)     Mobility Assessment     Row Name 11/05/21 2000 11/05/21 0853 11/04/21 2000 11/04/21 1830     Does patient have an order for bedrest or is patient medically unstable No - Continue assessment No - Continue assessment No - Continue assessment No - Continue assessment    What is the highest level of mobility based on the progressive mobility assessment? Level 5 (Walks with assist in room/hall) - Balance while stepping forward/back and can walk in room with assist - Complete Level 5 (Walks with assist in room/hall) - Balance while stepping forward/back and can walk in room with assist - Complete Level 5 (Walks with assist in room/hall) - Balance while stepping forward/back and can walk in room with assist - Complete Level 5 (Walks with assist in room/hall) - Balance while stepping forward/back and can walk in room with assist - Complete               Interdisciplinary Goals of Care Family Meeting   Date carried out: 11/06/2021  Location of the meeting:   Member's involved:   Durable Power of Tour manager:     Discussion: We discussed goals of care for Marvin Gill .   Code status:   Disposition:   Time spent for the meeting:     Marvin Guerin J, MD  11/06/2021, 3:14 PM         DVT prophylaxis: Eliquis Code Status: Full Family Communication:  Status is: Inpatient    Dispo: The patient is from: Home              Anticipated d/c is to: Home              Anticipated d/c date is: 3 days              Patient currently is not medically stable to d/c.      Consultants:    Procedures/Significant Events:  6/7 CTA chest PE protocol No evidence for pulmonary embolism. 2. New focal parenchymal opacity in the medial left lung apex measuring 1.6 x 0.9 x 1.8 cm.  Findings are indeterminate and may represent infectious/inflammatory process Consider one of the following in 3 months for both low-risk and high-risk individuals: (a) repeat chest CT, (b) follow-up PET-CT, or (c) tissue sampling. 6/8 echocardiogram Left Ventricle: LVEF= 55 to 60%. The left ventricle has no RWMA Left ventricular diastolic parameters are consistent with Grade I diastolic dysfunction (impaired relaxation).   Right Ventricle: The right ventricular size is normal. No increase in  right ventricular wall thickness. Right ventricular systolic function is  normal. There is normal pulmonary artery systolic pressure. The tricuspid  regurgitant velocity is 2.84 m/s, and   with an assumed right atrial pressure of 3  mmHg, the estimated right  ventricular systolic pressure is 54.2 mmHg.   Pericardium: Trivial pericardial effusion is present. The pericardial  effusion is anterior to the right ventricle. There is no evidence of  cardiac tamponade.    I have personally reviewed and interpreted all radiology studies and my findings are as above.  VENTILATOR SETTINGS:    Cultures 6/7 group A strep by PCR negative 6/7 blood pending 6/7 blood pending 6/7 urine pending 6/8 SARS coronavirus negative 6/9 blood pending 6/9 urine pending    Antimicrobials: Anti-infectives (From admission, onward)    Start     Ordered Stop   11/04/21 2200  cefTRIAXone (ROCEPHIN) 2 g in sodium chloride 0.9 % 100 mL IVPB        11/04/21 1758     11/04/21 1545  piperacillin-tazobactam (ZOSYN) IVPB 3.375 g        11/04/21 1535 11/04/21 1642   11/04/21 1545  vancomycin (VANCOREADY) IVPB 1750 mg/350 mL        11/04/21 1544 11/04/21 1847         Devices    LINES / TUBES:      Continuous Infusions:  sodium chloride 75 mL/hr at 11/06/21 1244   cefTRIAXone (ROCEPHIN)  IV 2 g (11/05/21 2204)     Objective: Vitals:   11/05/21 1833 11/05/21 2145 11/06/21 0553 11/06/21 1452  BP: 103/82  121/77 98/61 128/72  Pulse: (!) 110 99 88 97  Resp:  16 16   Temp:  (!) 100.5 F (38.1 C) 98.3 F (36.8 C) 98.2 F (36.8 C)  TempSrc:  Oral Oral Oral  SpO2:  98% 98% 94%  Weight:   82.4 kg   Height:        Intake/Output Summary (Last 24 hours) at 11/06/2021 1514 Last data filed at 11/06/2021 0931 Gross per 24 hour  Intake 240 ml  Output 800 ml  Net -560 ml    Filed Weights   11/04/21 1125 11/06/21 0553  Weight: 81.6 kg 82.4 kg    Examination:  General: A/O x4, No acute respiratory distress: Desats into mid 80s when he talks Eyes: negative scleral hemorrhage, negative anisocoria, negative icterus ENT: Negative Runny nose, negative gingival bleeding, Neck:  Negative scars, masses, torticollis, lymphadenopathy, JVD Lungs: Clear to auscultation bilaterally without wheezes or crackles Cardiovascular: Regular rate and rhythm without murmur gallop or rub normal S1 and S2 Abdomen: Mild abdominal pain with palpation, nondistended, positive soft, bowel sounds, no rebound, no ascites, no appreciable mass Extremities: No significant cyanosis, clubbing, or edema bilateral lower extremities Skin: Negative rashes, lesions, ulcers Psychiatric:  Negative depression, negative anxiety, negative fatigue, negative mania  Central nervous system:  Cranial nerves II through XII intact, tongue/uvula midline, all extremities muscle strength 5/5, sensation intact throughout, negative dysarthria, negative expressive aphasia, negative receptive aphasia.  .     Data Reviewed: Care during the described time interval was provided by me .  I have reviewed this patient's available data, including medical history, events of note, physical examination, and all test results as part of my evaluation.  CBC: Recent Labs  Lab 11/04/21 1351 11/05/21 0652 11/06/21 0547  WBC 31.6* 30.5* 28.4*  NEUTROABS 27.2*  --  23.7*  HGB 13.2 12.0* 10.7*  HCT 39.8 35.7* 32.1*  MCV 86.3 86.0 87.0  PLT 413* 369 349     Basic Metabolic Panel: Recent Labs  Lab 11/04/21 1351 11/05/21 0652 11/05/21 0937 11/06/21 0547  NA 138 139  --  138  K 4.0 4.2  --  4.1  CL 106 110  --  109  CO2 21* 20*  --  20*  GLUCOSE 106* 83  --  95  BUN 30* 18  --  17  CREATININE 1.24 1.01  --  1.00  CALCIUM 9.7 9.0  --  8.9  MG  --   --  1.7 1.9  PHOS  --   --  2.9 3.9    GFR: Estimated Creatinine Clearance: 77.8 mL/min (by C-G formula based on SCr of 1 mg/dL). Liver Function Tests: Recent Labs  Lab 11/04/21 1351 11/05/21 0652 11/06/21 0547  AST 12* 15 12*  ALT '10 10 10  '$ ALKPHOS 108 97 91  BILITOT 0.7 0.6 0.7  PROT 8.0 7.1 6.7  ALBUMIN 3.5 3.0* 2.9*    No results for input(s): "LIPASE", "AMYLASE" in the last 168 hours. No results for input(s): "AMMONIA" in the last 168 hours. Coagulation Profile: No results for input(s): "INR", "PROTIME" in the last 168 hours. Cardiac Enzymes: No results for input(s): "CKTOTAL", "CKMB", "CKMBINDEX", "TROPONINI" in the last 168 hours. BNP (last 3 results) No results for input(s): "PROBNP" in the last 8760 hours. HbA1C: No results for input(s): "HGBA1C" in the last 72 hours. CBG: No results for input(s): "GLUCAP" in the last 168 hours. Lipid Profile: No results for input(s): "CHOL", "HDL", "LDLCALC", "TRIG", "CHOLHDL", "LDLDIRECT" in the last 72 hours. Thyroid Function Tests: Recent Labs    11/04/21 1928  TSH 2.424    Anemia Panel: No results for input(s): "VITAMINB12", "FOLATE", "FERRITIN", "TIBC", "IRON", "RETICCTPCT" in the last 72 hours. Sepsis Labs: Recent Labs  Lab 11/04/21 1804 11/04/21 1809 11/04/21 1928 11/05/21 0652 11/06/21 0547  PROCALCITON 0.16  --   --  0.30 0.39  LATICACIDVEN  --  1.1 0.9  --   --      Recent Results (from the past 240 hour(s))  Group A Strep by PCR     Status: None   Collection Time: 11/04/21  3:07 PM   Specimen: Throat; Sterile Swab  Result Value Ref Range Status   Group A Strep by PCR NOT DETECTED NOT DETECTED  Final    Comment: Performed at [facility], [place] [address] [place], [place] [address]  Blood culture (routine x 2)     Status: None (Preliminary result)   Collection Time: 11/04/21  3:42 PM   Specimen: BLOOD RIGHT HAND  Result Value Ref Range Status   Specimen Description   Final    BLOOD RIGHT HAND BLOOD Performed at [facility] [address] [place], [place] [address]    Special Requests   Final    Blood Culture results may not be optimal due to an inadequate volume of blood received in culture bottles BOTTLES DRAWN AEROBIC AND ANAEROBIC Performed at [facility], [place] [address] [place], [place] [address]    Culture   Final    NO GROWTH 2 DAYS Performed at [facility] [address] [place], [place] [address]    Report Status PENDING  Incomplete  Blood culture (routine x 2)     Status: None (Preliminary result)   Collection Time: 11/04/21  4:00 PM   Specimen: BLOOD RIGHT ARM  Result Value Ref Range Status   Specimen Description   Final    BLOOD RIGHT ARM BLOOD Performed at [facility] [address] [place], [place] [address]    Special Requests   Final    Blood Culture adequate volume  BOTTLES DRAWN AEROBIC AND ANAEROBIC Performed at [facility] [address], [place], [place] [address]    Culture   Final    NO GROWTH 2 DAYS Performed at [facility] [address] [place], [place] [address]    Report Status PENDING  Incomplete  Urine Culture     Status: Abnormal   Collection Time: 11/04/21 10:01 PM   Specimen: Urine, Clean Catch  Result Value Ref Range Status   Specimen Description   Final    URINE, CLEAN CATCH Performed at [facility], [place] [address] [place], [place] [address]    Special Requests   Final    NONE Performed at [facility], [place] [address], [place], [place] [address]    Culture (A)  Final     <10,000 COLONIES/mL INSIGNIFICANT GROWTH Performed at [facility] [address], [place], [place] [address]    Report Status 11/06/2021 FINAL  Final  SARS Coronavirus 2 by RT PCR (hospital order, performed in [facility] hospital lab) *cepheid single result test* Anterior Nasal Swab     Status: None   Collection Time: 11/05/21  8:37 AM   Specimen: Anterior Nasal Swab  Result Value Ref Range Status   SARS Coronavirus 2 by RT PCR NEGATIVE NEGATIVE Final    Comment: (NOTE) SARS-CoV-2 target nucleic acids are NOT DETECTED.  The SARS-CoV-2 RNA is generally detectable in upper and lower respiratory specimens during the acute phase of infection. The lowest concentration of SARS-CoV-2 viral copies this assay can detect is 250 copies / mL. A negative result does not preclude SARS-CoV-2 infection and should not be used as the sole basis for treatment or other patient management decisions.  A negative result may occur with improper specimen collection / handling, submission of specimen other than nasopharyngeal swab, presence of viral mutation(s) within the areas targeted by this assay, and inadequate number of viral copies (<250 copies / mL). A negative result must be combined with clinical observations, patient history, and epidemiological information.  Fact Sheet for Patients:   https://www.patel.info/  Fact Sheet for Healthcare Providers: https://hall.com/  This test is not yet approved or  cleared by the [place] FDA and has been authorized for detection and/or diagnosis of SARS-CoV-2 by FDA under an Emergency Use Authorization (EUA).  This EUA will remain in effect (meaning this test can be used) for the duration of the COVID-19 declaration under Section 564(b)(1) of the Act, 21 U.S.C. section 360bbb-3(b)(1), unless the authorization is terminated or revoked sooner.  Performed at [facility], [place]  [address] [place], [place] [address]          Radiology Studies: ECHOCARDIOGRAM COMPLETE  Result Date: 11/05/2021    ECHOCARDIOGRAM REPORT   Patient Name:   JAKIN PAVAO [facility] Date of Exam: 11/05/2021 Medical Rec #:  [id number]        Height:       78.0 in Accession #:    [id number]       Weight:       180.0 lb Date of Birth:  1950/07/15        BSA:          2.159 m Patient Age:    9 years         BP:           139/78 mmHg Patient Gender: M  HR:           89 bpm. Exam Location:  Inpatient Procedure: 2D Echo, Cardiac Doppler and Color Doppler Indications:    Dyspnea  History:        Patient has prior history of Echocardiogram examinations, most                 recent 03/06/2018. CAD, COPD; Arrythmias:Atrial Fibrillation.  Sonographer:    Joette Catching RCS Referring Phys: [phone] [facility] A [facility]  Sonographer Comments: Technically challenging study due to limited acoustic windows. Image acquisition challenging due to uncooperative patient and Image acquisition challenging due to respiratory motion. IMPRESSIONS  1. Left ventricular ejection fraction, by estimation, is 55 to 60%. The left ventricle has normal function. The left ventricle has no regional wall motion abnormalities. Left ventricular diastolic parameters are consistent with Grade I diastolic dysfunction (impaired relaxation).  2. Right ventricular systolic function is normal. The right ventricular size is normal. There is normal pulmonary artery systolic pressure.  3. The pericardial effusion is anterior to the right ventricle. There is no evidence of cardiac tamponade.  4. The mitral valve is normal in structure. Trivial mitral valve regurgitation. No evidence of mitral stenosis.  5. The noncoronary cusp is highly calcified and partially fused to the right coronary cusp. The aortic valve is tricuspid. Aortic valve regurgitation is not visualized. Aortic valve sclerosis/calcification is present, without any evidence of aortic stenosis.  Aortic valve area, by VTI measures 1.99 cm. Aortic valve mean gradient measures 8.0 mmHg. Aortic valve Vmax measures 1.86 m/s.  6. The inferior vena cava is normal in size with greater than 50% respiratory variability, suggesting right atrial pressure of 3 mmHg. FINDINGS  Left Ventricle: Left ventricular ejection fraction, by estimation, is 55 to 60%. The left ventricle has normal function. The left ventricle has no regional wall motion abnormalities. The left ventricular internal cavity size was normal in size. There is  no left ventricular hypertrophy. Left ventricular diastolic parameters are consistent with Grade I diastolic dysfunction (impaired relaxation). Right Ventricle: The right ventricular size is normal. No increase in right ventricular wall thickness. Right ventricular systolic function is normal. There is normal pulmonary artery systolic pressure. The tricuspid regurgitant velocity is 2.84 m/s, and  with an assumed right atrial pressure of 3 mmHg, the estimated right ventricular systolic pressure is 40.9 mmHg. Left Atrium: Left atrial size was normal in size. Right Atrium: Right atrial size was normal in size. Pericardium: Trivial pericardial effusion is present. The pericardial effusion is anterior to the right ventricle. There is no evidence of cardiac tamponade. Mitral Valve: The mitral valve is normal in structure. Trivial mitral valve regurgitation. No evidence of mitral valve stenosis. Tricuspid Valve: The tricuspid valve is normal in structure. Tricuspid valve regurgitation is mild . No evidence of tricuspid stenosis. Aortic Valve: The noncoronary cusp is highly calcified and partially fused to the right coronary cusp. The aortic valve is tricuspid. Aortic valve regurgitation is not visualized. Aortic valve sclerosis/calcification is present, without any evidence of aortic stenosis. Aortic valve mean gradient measures 8.0 mmHg. Aortic valve peak gradient measures 13.8 mmHg. Aortic valve area,  by VTI measures 1.99 cm. Pulmonic Valve: The pulmonic valve was normal in structure. Pulmonic valve regurgitation is trivial. No evidence of pulmonic stenosis. Aorta: The aortic root is normal in size and structure. Venous: The inferior vena cava is normal in size with greater than 50% respiratory variability, suggesting right atrial pressure of 3 mmHg. IAS/Shunts: No atrial level shunt  detected by color flow Doppler.  LEFT VENTRICLE PLAX 2D LVIDd:         4.30 cm     Diastology LVIDs:         2.70 cm     LV e' medial:    7.72 cm/s LV PW:         1.00 cm     LV E/e' medial:  9.7 LV IVS:        0.90 cm     LV e' lateral:   12.60 cm/s LVOT diam:     2.10 cm     LV E/e' lateral: 5.9 LV SV:         67 LV SV Index:   31 LVOT Area:     3.46 cm  LV Volumes (MOD) LV vol d, MOD A2C: 51.8 ml LV vol d, MOD A4C: 48.1 ml LV vol s, MOD A2C: 23.1 ml LV vol s, MOD A4C: 20.0 ml LV SV MOD A2C:     28.7 ml LV SV MOD A4C:     48.1 ml LV SV MOD BP:      29.3 ml RIGHT VENTRICLE             IVC RV Basal diam:  4.20 cm     IVC diam: 1.60 cm RV Mid diam:    2.80 cm RV S prime:     22.80 cm/s TAPSE (M-mode): 1.7 cm LEFT ATRIUM             Index        RIGHT ATRIUM           Index LA diam:        3.50 cm 1.62 cm/m   RA Area:     19.00 cm LA Vol (A2C):   46.0 ml 21.31 ml/m  RA Volume:   52.10 ml  24.14 ml/m LA Vol (A4C):   48.1 ml 22.28 ml/m LA Biplane Vol: 48.4 ml 22.42 ml/m  AORTIC VALVE                     PULMONIC VALVE AV Area (Vmax):    2.14 cm      PV Vmax:       0.94 m/s AV Area (Vmean):   2.16 cm      PV Peak grad:  3.5 mmHg AV Area (VTI):     1.99 cm AV Vmax:           186.00 cm/s AV Vmean:          134.000 cm/s AV VTI:            0.338 m AV Peak Grad:      13.8 mmHg AV Mean Grad:      8.0 mmHg LVOT Vmax:         115.00 cm/s LVOT Vmean:        83.600 cm/s LVOT VTI:          0.194 m LVOT/AV VTI ratio: 0.57  AORTA Ao Root diam: 3.10 cm Ao Asc diam:  3.00 cm MITRAL VALVE               TRICUSPID VALVE MV Area (PHT): 5.38 cm     TR Peak grad:   32.3 mmHg MV Decel Time: 141 msec    TR Vmax:        284.00 cm/s MV E velocity: 74.70 cm/s MV A velocity: 93.60 cm/s  SHUNTS MV E/A ratio:  0.80  Systemic VTI:  0.19 m                            Systemic Diam: 2.10 cm Skeet Latch MD Electronically signed by Skeet Latch MD Signature Date/Time: 11/05/2021/3:11:58 PM    Final    CT Angio Chest PE W and/or Wo Contrast  Result Date: 11/04/2021 CLINICAL DATA:  High probability for PE. EXAM: CT ANGIOGRAPHY CHEST WITH CONTRAST TECHNIQUE: Multidetector CT imaging of the chest was performed using the standard protocol during bolus administration of intravenous contrast. Multiplanar CT image reconstructions and MIPs were obtained to evaluate the vascular anatomy. RADIATION DOSE REDUCTION: This exam was performed according to the departmental dose-optimization program which includes automated exposure control, adjustment of the mA and/or kV according to patient size and/or use of iterative reconstruction technique. CONTRAST:  61m OMNIPAQUE IOHEXOL 350 MG/ML SOLN COMPARISON:  CT angiogram chest abdomen and pelvis 04/21/2018 FINDINGS: Cardiovascular: Satisfactory opacification of the pulmonary arteries to the segmental level. No evidence of pulmonary embolism. Normal heart size. No pericardial effusion. Right chest port catheter tip ends in the right atrium. Mediastinum/Nodes: No enlarged mediastinal, hilar, or axillary lymph nodes. Thyroid gland, trachea, and esophagus demonstrate no significant findings. Lungs/Pleura: There is a new focal parenchymal area of opacity abutting the pleural surface in the medial left lung apex. This area measures 1.6 x 0.9 by 1.8 cm. Otherwise, biapical scarring and pleural calcifications are unchanged. There are mild emphysematous changes are present. No pleural effusion or pneumothorax. Upper Abdomen: No acute abnormality. Musculoskeletal: No chest wall abnormality. No acute or significant osseous findings.  Review of the MIP images confirms the above findings. IMPRESSION: 1. No evidence for pulmonary embolism. 2. New focal parenchymal opacity in the medial left lung apex measuring 1.6 x 0.9 x 1.8 cm. Findings are indeterminate and may represent infectious/inflammatory process Consider one of the following in 3 months for both low-risk and high-risk individuals: (a) repeat chest CT, (b) follow-up PET-CT, or (c) tissue sampling. This recommendation follows the consensus statement: Guidelines for Management of Incidental Pulmonary Nodules Detected on CT Images: From the Fleischner Society 2017; Radiology 2017; 284:228-243. or neoplasm. Emphysema (ICD10-J43.9). Electronically Signed   By: ARonney AstersM.D.   On: 11/04/2021 19:20        Scheduled Meds:  ALPRAZolam  1 mg Oral QHS   And   ALPRAZolam  0.5 mg Oral Daily   apixaban  5 mg Oral BID   darifenacin  7.5 mg Oral Daily   docusate sodium  100 mg Oral BID   feeding supplement  237 mL Oral QID   gabapentin  600 mg Oral TID   levothyroxine  50 mcg Oral QAC breakfast   multivitamin with minerals  1 tablet Oral Daily   pantoprazole  40 mg Oral BID   pravastatin  40 mg Oral Daily   tamsulosin  0.4 mg Oral BID   Continuous Infusions:  sodium chloride 75 mL/hr at 11/06/21 1244   cefTRIAXone (ROCEPHIN)  IV 2 g (11/05/21 2204)     LOS: 1 day    Time spent:40 min    Keria Widrig, CGeraldo Docker MD [facility]   If 7PM-7AM, please contact night-coverage 11/06/2021, 3:14 PM

## 2021-11-06 NOTE — Progress Notes (Signed)
IP PROGRESS NOTE  Subjective:   Events of the last few days noted.  Patient known to me with history of urothelial carcinoma with squamous differentiation diagnosed in April 2023 he developed recurrent disease in May 2023 and was scheduled to receive chemotherapy next week.  Patient hospitalized with failure to thrive and dyspnea on exertion.  He was found to have leukocytosis with work-up currently ongoing.  Clinically, he reports no major changes in his health.  He still quite debilitated no fevers or chills.  Objective:  Vital signs in last 24 hours: Temp:  [98.3 F (36.8 C)-100.5 F (38.1 C)] 98.3 F (36.8 C) (06/09 0553) Pulse Rate:  [85-110] 88 (06/09 0553) Resp:  [16-18] 16 (06/09 0553) BP: (98-146)/(61-91) 98/61 (06/09 0553) SpO2:  [98 %-99 %] 98 % (06/09 0553) Weight:  [181 lb 10.5 oz (82.4 kg)] 181 lb 10.5 oz (82.4 kg) (06/09 0553) Weight change: 1 lb 10.5 oz (0.752 kg)    Intake/Output from previous day: 06/08 0701 - 06/09 0700 In: 1967.5 [P.O.:480; I.V.:1387.5; IV Piggyback:100] Out: 1300 [Urine:1300] General: Alert, awake without distress. Head: Normocephalic atraumatic. Mouth: mucous membranes moist, pharynx normal without lesions Eyes: No scleral icterus.  Pupils are equal and round reactive to light. Resp: clear to auscultation bilaterally without rhonchi or wheezes or dullness to percussion. Cardio: regular rate and rhythm, S1, S2 normal, no murmur, click, rub or gallop GI: soft, non-tender; bowel sounds normal; no masses,  no organomegaly Musculoskeletal: No joint deformity or effusion. Neurological: No motor, sensory deficits.  Intact deep tendon reflexes. Skin: No rashes or lesions.  Portacath in place without any erythema or induration.  Lab Results: Recent Labs    11/05/21 0652 11/06/21 0547  WBC 30.5* 28.4*  HGB 12.0* 10.7*  HCT 35.7* 32.1*  PLT 369 349    BMET Recent Labs    11/05/21 0652 11/06/21 0547  NA 139 138  K 4.2 4.1  CL 110 109   CO2 20* 20*  GLUCOSE 83 95  BUN 18 17  CREATININE 1.01 1.00  CALCIUM 9.0 8.9    Studies/Results: ECHOCARDIOGRAM COMPLETE  Result Date: 11/05/2021    ECHOCARDIOGRAM REPORT   Patient Name:   Marvin Gill [facility] Date of Exam: 11/05/2021 Medical Rec #:  [id number]        Height:       78.0 in Accession #:    [id number]       Weight:       180.0 lb Date of Birth:  [birth year]        BSA:          2.159 m Patient Age:    71 years         BP:           139/78 mmHg Patient Gender: M                HR:           89 bpm. Exam Location:  Inpatient Procedure: 2D Echo, Cardiac Doppler and Color Doppler Indications:    Dyspnea  History:        Patient has prior history of Echocardiogram examinations, most                 recent 03/06/2018. CAD, COPD; Arrythmias:Atrial Fibrillation.  Sonographer:    Joette Catching RCS Referring Phys: [id number] [facility] A [place]  Sonographer Comments: Technically challenging study due to limited acoustic windows. Image acquisition challenging due to uncooperative patient and Image acquisition challenging due  to respiratory motion. IMPRESSIONS  1. Left ventricular ejection fraction, by estimation, is 55 to 60%. The left ventricle has normal function. The left ventricle has no regional wall motion abnormalities. Left ventricular diastolic parameters are consistent with Grade I diastolic dysfunction (impaired relaxation).  2. Right ventricular systolic function is normal. The right ventricular size is normal. There is normal pulmonary artery systolic pressure.  3. The pericardial effusion is anterior to the right ventricle. There is no evidence of cardiac tamponade.  4. The mitral valve is normal in structure. Trivial mitral valve regurgitation. No evidence of mitral stenosis.  5. The noncoronary cusp is highly calcified and partially fused to the right coronary cusp. The aortic valve is tricuspid. Aortic valve regurgitation is not visualized. Aortic valve sclerosis/calcification is present, without  any evidence of aortic stenosis. Aortic valve area, by VTI measures 1.99 cm. Aortic valve mean gradient measures 8.0 mmHg. Aortic valve Vmax measures 1.86 m/s.  6. The inferior vena cava is normal in size with greater than 50% respiratory variability, suggesting right atrial pressure of 3 mmHg. FINDINGS  Left Ventricle: Left ventricular ejection fraction, by estimation, is 55 to 60%. The left ventricle has normal function. The left ventricle has no regional wall motion abnormalities. The left ventricular internal cavity size was normal in size. There is  no left ventricular hypertrophy. Left ventricular diastolic parameters are consistent with Grade I diastolic dysfunction (impaired relaxation). Right Ventricle: The right ventricular size is normal. No increase in right ventricular wall thickness. Right ventricular systolic function is normal. There is normal pulmonary artery systolic pressure. The tricuspid regurgitant velocity is 2.84 m/s, and  with an assumed right atrial pressure of 3 mmHg, the estimated right ventricular systolic pressure is 87.5 mmHg. Left Atrium: Left atrial size was normal in size. Right Atrium: Right atrial size was normal in size. Pericardium: Trivial pericardial effusion is present. The pericardial effusion is anterior to the right ventricle. There is no evidence of cardiac tamponade. Mitral Valve: The mitral valve is normal in structure. Trivial mitral valve regurgitation. No evidence of mitral valve stenosis. Tricuspid Valve: The tricuspid valve is normal in structure. Tricuspid valve regurgitation is mild . No evidence of tricuspid stenosis. Aortic Valve: The noncoronary cusp is highly calcified and partially fused to the right coronary cusp. The aortic valve is tricuspid. Aortic valve regurgitation is not visualized. Aortic valve sclerosis/calcification is present, without any evidence of aortic stenosis. Aortic valve mean gradient measures 8.0 mmHg. Aortic valve peak gradient  measures 13.8 mmHg. Aortic valve area, by VTI measures 1.99 cm. Pulmonic Valve: The pulmonic valve was normal in structure. Pulmonic valve regurgitation is trivial. No evidence of pulmonic stenosis. Aorta: The aortic root is normal in size and structure. Venous: The inferior vena cava is normal in size with greater than 50% respiratory variability, suggesting right atrial pressure of 3 mmHg. IAS/Shunts: No atrial level shunt detected by color flow Doppler.  LEFT VENTRICLE PLAX 2D LVIDd:         4.30 cm     Diastology LVIDs:         2.70 cm     LV e' medial:    7.72 cm/s LV PW:         1.00 cm     LV E/e' medial:  9.7 LV IVS:        0.90 cm     LV e' lateral:   12.60 cm/s LVOT diam:     2.10 cm     LV E/e'  lateral: 5.9 LV SV:         67 LV SV Index:   31 LVOT Area:     3.46 cm  LV Volumes (MOD) LV vol d, MOD A2C: 51.8 ml LV vol d, MOD A4C: 48.1 ml LV vol s, MOD A2C: 23.1 ml LV vol s, MOD A4C: 20.0 ml LV SV MOD A2C:     28.7 ml LV SV MOD A4C:     48.1 ml LV SV MOD BP:      29.3 ml RIGHT VENTRICLE             IVC RV Basal diam:  4.20 cm     IVC diam: 1.60 cm RV Mid diam:    2.80 cm RV S prime:     22.80 cm/s TAPSE (M-mode): 1.7 cm LEFT ATRIUM             Index        RIGHT ATRIUM           Index LA diam:        3.50 cm 1.62 cm/m   RA Area:     19.00 cm LA Vol (A2C):   46.0 ml 21.31 ml/m  RA Volume:   52.10 ml  24.14 ml/m LA Vol (A4C):   48.1 ml 22.28 ml/m LA Biplane Vol: 48.4 ml 22.42 ml/m  AORTIC VALVE                     PULMONIC VALVE AV Area (Vmax):    2.14 cm      PV Vmax:       0.94 m/s AV Area (Vmean):   2.16 cm      PV Peak grad:  3.5 mmHg AV Area (VTI):     1.99 cm AV Vmax:           186.00 cm/s AV Vmean:          134.000 cm/s AV VTI:            0.338 m AV Peak Grad:      13.8 mmHg AV Mean Grad:      8.0 mmHg LVOT Vmax:         115.00 cm/s LVOT Vmean:        83.600 cm/s LVOT VTI:          0.194 m LVOT/AV VTI ratio: 0.57  AORTA Ao Root diam: 3.10 cm Ao Asc diam:  3.00 cm MITRAL VALVE                TRICUSPID VALVE MV Area (PHT): 5.38 cm    TR Peak grad:   32.3 mmHg MV Decel Time: 141 msec    TR Vmax:        284.00 cm/s MV E velocity: 74.70 cm/s MV A velocity: 93.60 cm/s  SHUNTS MV E/A ratio:  0.80        Systemic VTI:  0.19 m                            Systemic Diam: 2.10 cm Skeet Latch MD Electronically signed by Skeet Latch MD Signature Date/Time: 11/05/2021/3:11:58 PM    Final    CT Angio Chest PE W and/or Wo Contrast  Result Date: 11/04/2021 CLINICAL DATA:  High probability for PE. EXAM: CT ANGIOGRAPHY CHEST WITH CONTRAST TECHNIQUE: Multidetector CT imaging of the chest was performed using the standard protocol during bolus administration of intravenous contrast.  Multiplanar CT image reconstructions and MIPs were obtained to evaluate the vascular anatomy. RADIATION DOSE REDUCTION: This exam was performed according to the departmental dose-optimization program which includes automated exposure control, adjustment of the mA and/or kV according to patient size and/or use of iterative reconstruction technique. CONTRAST:  58m OMNIPAQUE IOHEXOL 350 MG/ML SOLN COMPARISON:  CT angiogram chest abdomen and pelvis 04/21/2018 FINDINGS: Cardiovascular: Satisfactory opacification of the pulmonary arteries to the segmental level. No evidence of pulmonary embolism. Normal heart size. No pericardial effusion. Right chest port catheter tip ends in the right atrium. Mediastinum/Nodes: No enlarged mediastinal, hilar, or axillary lymph nodes. Thyroid gland, trachea, and esophagus demonstrate no significant findings. Lungs/Pleura: There is a new focal parenchymal area of opacity abutting the pleural surface in the medial left lung apex. This area measures 1.6 x 0.9 by 1.8 cm. Otherwise, biapical scarring and pleural calcifications are unchanged. There are mild emphysematous changes are present. No pleural effusion or pneumothorax. Upper Abdomen: No acute abnormality. Musculoskeletal: No chest wall abnormality. No  acute or significant osseous findings. Review of the MIP images confirms the above findings. IMPRESSION: 1. No evidence for pulmonary embolism. 2. New focal parenchymal opacity in the medial left lung apex measuring 1.6 x 0.9 x 1.8 cm. Findings are indeterminate and may represent infectious/inflammatory process Consider one of the following in 3 months for both low-risk and high-risk individuals: (a) repeat chest CT, (b) follow-up PET-CT, or (c) tissue sampling. This recommendation follows the consensus statement: Guidelines for Management of Incidental Pulmonary Nodules Detected on CT Images: From the Fleischner Society 2017; Radiology 2017; 284:228-243. or neoplasm. Emphysema (ICD10-J43.9). Electronically Signed   By: ARonney AstersM.D.   On: 11/04/2021 19:20   CT Soft Tissue Neck Wo Contrast  Result Date: 11/04/2021 CLINICAL DATA:  Epiglottitis or tonsillitis suspected; urothelial carcinoma EXAM: CT NECK WITHOUT CONTRAST TECHNIQUE: Multidetector CT imaging of the neck was performed following the standard protocol without intravenous contrast. RADIATION DOSE REDUCTION: This exam was performed according to the departmental dose-optimization program which includes automated exposure control, adjustment of the mA and/or kV according to patient size and/or use of iterative reconstruction technique. COMPARISON:  None Available. FINDINGS: Pharynx and larynx: Unremarkable.  No mass or swelling. Salivary glands: Parotid and submandibular glands are atrophic but otherwise unremarkable. Thyroid: Normal. Lymph nodes: No enlarged or abnormal density nodes. Vascular: Minimal calcified plaque at the right common carotid bifurcation. Limited intracranial: Dictated separately. Visualized orbits: Unremarkable. Mastoids and visualized paranasal sinuses: No significant opacification. Skeleton: Cervical spine degenerative changes. Upper chest: Emphysema. Scarring at the lung apices. Right chest wall port. IMPRESSION: No neck mass,  adenopathy, or significant inflammatory changes. Electronically Signed   By: PMacy MisM.D.   On: 11/04/2021 15:13   CT Head Wo Contrast  Result Date: 11/04/2021 CLINICAL DATA:  Head trauma, moderate-severe EXAM: CT HEAD WITHOUT CONTRAST TECHNIQUE: Contiguous axial images were obtained from the base of the skull through the vertex without intravenous contrast. RADIATION DOSE REDUCTION: This exam was performed according to the departmental dose-optimization program which includes automated exposure control, adjustment of the mA and/or kV according to patient size and/or use of iterative reconstruction technique. COMPARISON:  None Available. FINDINGS: Brain: There is no acute intracranial hemorrhage, mass effect, or edema. Gray-white differentiation is preserved. There is no extra-axial fluid collection. Prominence of the ventricles and sulci reflects mild parenchymal volume loss. Minimal patchy hypoattenuation in the supratentorial white matter is nonspecific but may reflect minor chronic microvascular ischemic changes. Vascular: There is atherosclerotic  calcification at the skull base. Skull: Calvarium is unremarkable. Sinuses/Orbits: No acute finding. Other: None. IMPRESSION: No acute intracranial abnormality. Electronically Signed   By: Macy Mis M.D.   On: 11/04/2021 15:05   DG Chest 2 View  Result Date: 11/04/2021 CLINICAL DATA:  Shortness of breath.  Nephrectomy last month. EXAM: CHEST - 2 VIEW COMPARISON:  Chest radiograph dated October 29, 2021 FINDINGS: The heart size and mediastinal contours are within normal limits. Both lungs are clear. Right IJ access Port-A-Cath with distal tip in the SVC, unchanged. The visualized skeletal structures are unremarkable. IMPRESSION: No active cardiopulmonary disease. Electronically Signed   By: Keane Police D.O.   On: 11/04/2021 11:54    Medications: I have reviewed the patient's current medications.  Assessment/Plan:  71 year old with:  1.  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  He has disease that manifesting with nodularity in the retroperitoneum.  He is scheduled to have palliative chemotherapy in the near future which will be tentatively delayed given his recent hospitalizations.  2.  Failure to thrive and dyspnea on exertion: His work-up has been unrevealing at this time.  CT scans of the chest, head and neck did not show any acute pathology.  This could be related to infectious etiology.  3.  Leukocytosis: This is related to a possible infectious etiology versus malignancy.  He is currently receiving intravenous antibiotics with cultures are pending.  4.  Follow-up: We will arrange follow-up upon discharge for discussion regarding the start of chemotherapy.   35  minutes were dedicated to this visit.  50% of the time was face-to-face and the time was spent on reviewing laboratory data, imaging studies, discussing treatment options,  and answering questions regarding future plan.      LOS: 1 day   Zola Button 11/06/2021, 8:39 AM

## 2021-11-07 DIAGNOSIS — C670 Malignant neoplasm of trigone of bladder: Secondary | ICD-10-CM | POA: Diagnosis not present

## 2021-11-07 DIAGNOSIS — D72829 Elevated white blood cell count, unspecified: Secondary | ICD-10-CM | POA: Diagnosis not present

## 2021-11-07 DIAGNOSIS — R0600 Dyspnea, unspecified: Secondary | ICD-10-CM | POA: Diagnosis not present

## 2021-11-07 DIAGNOSIS — C662 Malignant neoplasm of left ureter: Secondary | ICD-10-CM | POA: Diagnosis not present

## 2021-11-07 DIAGNOSIS — E43 Unspecified severe protein-calorie malnutrition: Secondary | ICD-10-CM | POA: Diagnosis present

## 2021-11-07 DIAGNOSIS — R627 Adult failure to thrive: Secondary | ICD-10-CM | POA: Diagnosis not present

## 2021-11-07 DIAGNOSIS — N401 Enlarged prostate with lower urinary tract symptoms: Secondary | ICD-10-CM | POA: Diagnosis not present

## 2021-11-07 LAB — CBC WITH DIFFERENTIAL/PLATELET
Abs Immature Granulocytes: 0.29 10*3/uL — ABNORMAL HIGH (ref 0.00–0.07)
Basophils Absolute: 0.1 10*3/uL (ref 0.0–0.1)
Basophils Relative: 0 %
Eosinophils Absolute: 0 10*3/uL (ref 0.0–0.5)
Eosinophils Relative: 0 %
HCT: 27.8 % — ABNORMAL LOW (ref 39.0–52.0)
Hemoglobin: 9.5 g/dL — ABNORMAL LOW (ref 13.0–17.0)
Immature Granulocytes: 1 %
Lymphocytes Relative: 8 %
Lymphs Abs: 2.2 10*3/uL (ref 0.7–4.0)
MCH: 29.2 pg (ref 26.0–34.0)
MCHC: 34.2 g/dL (ref 30.0–36.0)
MCV: 85.5 fL (ref 80.0–100.0)
Monocytes Absolute: 2.2 10*3/uL — ABNORMAL HIGH (ref 0.1–1.0)
Monocytes Relative: 8 %
Neutro Abs: 22.5 10*3/uL — ABNORMAL HIGH (ref 1.7–7.7)
Neutrophils Relative %: 83 %
Platelets: 325 10*3/uL (ref 150–400)
RBC: 3.25 MIL/uL — ABNORMAL LOW (ref 4.22–5.81)
RDW: 14.6 % (ref 11.5–15.5)
WBC: 27.2 10*3/uL — ABNORMAL HIGH (ref 4.0–10.5)
nRBC: 0 % (ref 0.0–0.2)

## 2021-11-07 LAB — COMPREHENSIVE METABOLIC PANEL
ALT: 10 U/L (ref 0–44)
AST: 13 U/L — ABNORMAL LOW (ref 15–41)
Albumin: 2.5 g/dL — ABNORMAL LOW (ref 3.5–5.0)
Alkaline Phosphatase: 88 U/L (ref 38–126)
Anion gap: 9 (ref 5–15)
BUN: 19 mg/dL (ref 8–23)
CO2: 19 mmol/L — ABNORMAL LOW (ref 22–32)
Calcium: 8.5 mg/dL — ABNORMAL LOW (ref 8.9–10.3)
Chloride: 109 mmol/L (ref 98–111)
Creatinine, Ser: 1.17 mg/dL (ref 0.61–1.24)
GFR, Estimated: >60 mL/min (ref >60)
Glucose, Bld: 101 mg/dL — ABNORMAL HIGH (ref 70–99)
Potassium: 3.5 mmol/L (ref 3.5–5.1)
Sodium: 137 mmol/L (ref 135–145)
Total Bilirubin: 1 mg/dL (ref 0.3–1.2)
Total Protein: 6.1 g/dL — ABNORMAL LOW (ref 6.5–8.1)

## 2021-11-07 LAB — BASIC METABOLIC PANEL
Anion gap: 10 (ref 5–15)
BUN: 17 mg/dL (ref 8–23)
CO2: 16 mmol/L — ABNORMAL LOW (ref 22–32)
Calcium: 8.4 mg/dL — ABNORMAL LOW (ref 8.9–10.3)
Chloride: 108 mmol/L (ref 98–111)
Creatinine, Ser: 1.06 mg/dL (ref 0.61–1.24)
GFR, Estimated: >60 mL/min (ref >60)
Glucose, Bld: 98 mg/dL (ref 70–99)
Potassium: 3.6 mmol/L (ref 3.5–5.1)
Sodium: 134 mmol/L — ABNORMAL LOW (ref 135–145)

## 2021-11-07 LAB — PHOSPHORUS
Phosphorus: 3.2 mg/dL (ref 2.5–4.6)
Phosphorus: 2.7 mg/dL (ref 2.5–4.6)

## 2021-11-07 LAB — MAGNESIUM
Magnesium: 1.7 mg/dL (ref 1.7–2.4)
Magnesium: 1.7 mg/dL (ref 1.7–2.4)

## 2021-11-07 MED ORDER — CHLORHEXIDINE GLUCONATE CLOTH 2 % EX PADS
6.0000 | MEDICATED_PAD | Freq: Every day | CUTANEOUS | Status: DC
  Administered 2021-11-07 – 2021-11-13 (×7): 6 via TOPICAL

## 2021-11-07 MED ORDER — AMIODARONE LOAD VIA INFUSION
150.0000 mg | Freq: Once | INTRAVENOUS | Status: AC
  Administered 2021-11-07: 150 mg via INTRAVENOUS
  Filled 2021-11-07: qty 83.34

## 2021-11-07 MED ORDER — AMIODARONE HCL IN DEXTROSE 360-4.14 MG/200ML-% IV SOLN
30.0000 mg/h | INTRAVENOUS | Status: DC
  Administered 2021-11-08 – 2021-11-10 (×5): 30 mg/h via INTRAVENOUS
  Filled 2021-11-07 (×6): qty 200

## 2021-11-07 MED ORDER — AMIODARONE HCL IN DEXTROSE 360-4.14 MG/200ML-% IV SOLN
60.0000 mg/h | INTRAVENOUS | Status: AC
  Administered 2021-11-07: 60 mg/h via INTRAVENOUS
  Filled 2021-11-07: qty 200

## 2021-11-07 NOTE — Progress Notes (Signed)
IP PROGRESS NOTE  Subjective:   No major changes noted overnight.  Marvin Gill appears comfortable but overall still fatigue.  He denies any fevers, chills or sweats.  He denies any abdominal pain or early satiety.  Overall no major complaints.  Objective:  Vital signs in last 24 hours: Temp:  [97.7 F (36.5 C)-99.7 F (37.6 C)] 97.7 F (36.5 C) (06/10 0519) Pulse Rate:  [93-97] 96 (06/10 0519) Resp:  [16-20] 16 (06/10 0519) BP: (109-128)/(61-72) 117/65 (06/10 0519) SpO2:  [94 %-97 %] 96 % (06/10 0519) Weight:  [180 lb 1.9 oz (81.7 kg)] 180 lb 1.9 oz (81.7 kg) (06/10 0511) Weight change: -1 lb 8.7 oz (-0.7 kg)    Intake/Output from previous day: 06/09 0701 - 06/10 0700 In: 480 [P.O.:480] Out: 1200 [Urine:1200]    General appearance: Comfortable appearing without any discomfort Head: Normocephalic without any trauma Oropharynx: Mucous membranes are moist and pink without any thrush or ulcers. Eyes: Pupils are equal and round reactive to light. Lymph nodes: No cervical, supraclavicular, inguinal or axillary lymphadenopathy.   Heart:regular rate and rhythm.  S1 and S2 without leg edema. Lung: Clear without any rhonchi or wheezes.  No dullness to percussion. Abdomin: Soft, nontender, nondistended with good bowel sounds.  No hepatosplenomegaly. Musculoskeletal: No joint deformity or effusion.  Full range of motion noted. Neurological: No deficits noted on motor, sensory and deep tendon reflex exam. Skin: No petechial rash or dryness.  Appeared moist.    Portacath in place without any erythema or induration.  Lab Results: Recent Labs    11/06/21 1612 11/07/21 0705  WBC 27.8* 27.2*  HGB 10.7* 9.5*  HCT 32.5* 27.8*  PLT 342 325    BMET Recent Labs    11/06/21 1612 11/07/21 0705  NA 134* 137  K 3.8 3.5  CL 105 109  CO2 18* 19*  GLUCOSE 100* 101*  BUN 19 19  CREATININE 1.03 1.17  CALCIUM 8.8* 8.5*    Studies/Results: DG CHEST PORT 1 VIEW  Result Date:  11/06/2021 CLINICAL DATA:  Shortness of breath EXAM: PORTABLE CHEST 1 VIEW COMPARISON:  Portable exam 1529 hours compared to 11/04/2021 FINDINGS: RIGHT jugular Port-A-Cath with tip projecting over SVC. Normal heart size, mediastinal contours, and pulmonary vascularity. Lungs clear. No infiltrate, pleural effusion, or pneumothorax. Osseous structures unremarkable. IMPRESSION: No acute abnormalities. Electronically Signed   By: Lavonia Dana M.D.   On: 11/06/2021 15:35    Medications: I have reviewed the patient's current medications.  Assessment/Plan:  71 year old with:  1.  Left ureteral cancer with advanced urothelial carcinoma diagnosed in May 2023.  He is planned to have palliative chemotherapy at some point in the future.  The treatment will be delayed given his recent hospitalization and illness.  We will address this as an outpatient.  2.  Failure to thrive and dyspnea on exertion: No clear-cut etiology identified at this time.  He appears to be improving.  3.  Leukocytosis: Related to malignancy versus infectious disease process.  4.  Follow-up: I have no objections to discharge once felt stable by the primary team.  He has follow-up in June 19.  Please call with questions.   25  minutes were spent on this encounter.  50% of the time was face-to-face and was dedicated to updating his disease status, discussing treatment choices, reviewing laboratory data and future plan of care discussion.     LOS: 2 days   Zola Button 11/07/2021, 10:33 AM

## 2021-11-07 NOTE — Progress Notes (Signed)
PROGRESS NOTE    Marvin Gill  KGM:[id number] DOB: [birth year] DOA: 11/04/2021 PCP: Lavone Orn, MD     Brief Narrative:   71 y.o. 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    He presents with progressive worsening shortness of breath on exertion over the last 2 months but getting progressively worse, dyspnea has progressed to minimal exertion, with just  few steps.  He denies chest pain.  He also report dizziness on standing and on ambulation.  He has fallen multiple times.  He passed out also multiple times per daughter, last episode on Sunday, he fell yesterday.    He also report 50 pounds weight loss.  Reports poor oral intake.  He has abdominal pain since after the surgery, not getting worse.  He has not felt the same since after the surgery.   He was off Eliquis for 2 weeks. He started to take eliquis  yesterday as recommended by his urology.  Eliquis was on hold due to hematuria.  He had an episode of bloody urine in the ED.   Subjective: 6/10 afebrile overnight now on room air ADDENDUM paged to bedside~1900 patient and V. tach/A-fib at 170-180 bpm.  Arrived to bedside patient negative SOB, negative CP.  Assessment & Plan: Covid vaccination;   Principal Problem:   Dyspnea Active Problems:   Benign localized prostatic hyperplasia with lower urinary tract symptoms (LUTS)   PAF (paroxysmal atrial fibrillation) (HCC)   Bladder cancer (HCC)   COPD with emphysema (HCC)   GERD (gastroesophageal reflux disease)   Hypothyroidism   Dyspnea on exertion   Leukocytosis   Protein-calorie malnutrition, severe  Dyspnea on exertion:  -He presents complaining of dyspnea on exertion, progressively getting worse.   -Oxygen sat drop from 99 to 70 during orthostatic vitals.  -He has been off Eliquis for 2 weeks. Just resume yesterday.  -Troponin negative times 2.  -Hb at 13.  -6/7 CT chest PE protocol negative for PE.  See results below.  NOTE will need repeat CT scan in 3 months -6/8 Echocardiogram pending.  -He will need Oxygen on ambulation.    Syncope, frequent fall. Suspect related to orthostatic Hypotension.  Suspect related to positive orthostatic hypotension. HR increase to 107 from 80. SBP 121 down to 95. CT head negative.  Plan to continue with IV fluids.  Suspect related to hypovolemia, dehydration.  Neuro exam non focal.  -6/7 TSH WNL -6/8 AM cortisol WNL -Orthostatic vitals every shift  Acute Diastolic CHF - 6/9 see echocardiogram below -Strict in and out -392.52m - Daily weight Filed Weights   11/04/21 1125 11/06/21 0553 11/07/21 0511  Weight: 81.6 kg 82.4 kg 81.7 kg  -6/10 Amiodarone loading dose plus drip   PAF;  -Resume Eliquis.   V. tach - 6/10 patient had V. tach 170-180 stable (negative CP, negative SOB, cognition intact.) - See acute diastolic CHF  Leukocytosis, left shift.  Lab Results  Component Value Date   WBC 27.2 (H) 11/07/2021   WBC 27.8 (H) 11/06/2021   WBC 28.4 (H) 11/06/2021   WBC 30.5 (H) 11/05/2021   WBC 31.6 (H) 11/04/2021  -6/7 cultures NGTD -6/9 PCXR negative -6/9 UA pending, blood culture urine culture; NGTD as of 6/10 -6/9 check pro-calcitonin.= 0.39 -6/9  lactic acid =0.8 -Continue with IV antibiotics: IV ceftriaxone.  -6/9 plan to rule out infection.  -6/10 most likely secondary to his high-grade cancer however will continue antibiotic until cultures negative  5-T- 3 High grade  urothelial carcinoma of left distal ureter:  -Dr. Zola Button oncology aware of patient's admission per admission note. -Status post cystoscopy with left ureteral stent placement and TUR of the left  ureteral orifice, robotic assisted laparoscopic left nephro  ureterectomy 09/01/2021 -He has chronic abdominal pain after Surgery.  -If leukocytosis persist, pro-calcitonin elevated and no other source of infection is find, could consider CT abdomen pelvis.  -6/9 obtain CT abdomen pelvis in A.m. if urine and blood cultures negative   Hypothyroidism;  -Synthroid 50 mcg daily.    FTT;  -Daughter is concern about high dose gabapentin. Plan to reduce gabapentin to 600 mg.    Sore throat:  -Strept A negative.  -6/8 resolved   GERD;  PPI BID>    Severe protein calorie malnutrition - 6/9 continue feeding supplements per nutrition recommendation  Goals of care - 6/10 PT/OT consult patient with High grade  urothelial carcinoma of left distal ureter; extremely weak evaluate for CIR vs SNF    Mobility Assessment (last 72 hours)     Mobility Assessment     Row Name 11/07/21 0928 11/06/21 2000 11/05/21 2000 11/05/21 0853 11/04/21 2000   Does patient have an order for bedrest or is patient medically unstable No - Continue assessment No - Continue assessment No - Continue assessment No - Continue assessment No - Continue assessment   What is the highest level of mobility based on the progressive mobility assessment? Level 5 (Walks with assist in room/hall) - Balance while stepping forward/back and can walk in room with assist - Complete Level 5 (Walks with assist in room/hall) - Balance while stepping forward/back and can walk in room with assist - Complete Level 5 (Walks with assist in room/hall) - Balance while stepping forward/back and can walk in room with assist - Complete Level 5 (Walks with assist in room/hall) - Balance while stepping forward/back and can walk in room with assist - Complete Level 5 (Walks with assist in room/hall) - Balance while stepping forward/back and can walk in room with assist - Complete    Row Name 11/04/21 1830           Does patient have an order for bedrest or is patient medically unstable No - Continue assessment        What is the highest level of mobility based on the progressive mobility assessment? Level 5 (Walks with assist in room/hall) - Balance while stepping forward/back and can walk in room with assist - Complete                  Interdisciplinary Goals of Care Family Meeting   Date carried out: 11/07/2021  Location of the meeting:   Member's involved:   Durable Power of Tour manager:     Discussion: We discussed goals of care for [name] .   Code status:   Disposition:   Time spent for the meeting:     Bonner Larue J, MD  11/07/2021, 12:47 PM         DVT prophylaxis: Eliquis Code Status: Full Family Communication:  Status is: Inpatient    Dispo: The patient is from: Home              Anticipated d/c is to:  Home              Anticipated d/c date is: 3 days              Patient currently is not medically stable to d/c.      Consultants:    Procedures/Significant Events:  6/7 CTA chest PE protocol No evidence for pulmonary embolism. 2. New focal parenchymal opacity in the medial left lung apex measuring 1.6 x 0.9 x 1.8 cm. Findings are indeterminate and may represent infectious/inflammatory process Consider one of the following in 3 months for both low-risk and high-risk individuals: (a) repeat chest CT, (b) follow-up PET-CT, or (c) tissue sampling. 6/8 echocardiogram Left Ventricle: LVEF= 55 to 60%. The left ventricle has no RWMA Left ventricular diastolic parameters are consistent with Grade I diastolic dysfunction (impaired relaxation).   Right Ventricle: The right ventricular size is normal. No increase in  right ventricular wall thickness. Right ventricular systolic function is  normal. There is normal pulmonary artery systolic pressure. The tricuspid  regurgitant velocity is 2.84 m/s, and   with an assumed right atrial pressure of 3 mmHg, the estimated right  ventricular systolic pressure is 06.3 mmHg.    Pericardium: Trivial pericardial effusion is present. The pericardial  effusion is anterior to the right ventricle. There is no evidence of  cardiac tamponade.  6/9 PCXR: No acute findings   I have personally reviewed and interpreted all radiology studies and my findings are as above.  VENTILATOR SETTINGS: Room air 6/10 SPO2 96%   Cultures 6/7 group A strep by PCR negative 6/7 blood pending 6/7 blood pending 6/7 urine positive insignificant growth 6/8 SARS coronavirus negative 6/9 blood left AC NGTD  6/9 blood RIGHT AC NGTD 6/9 urine pending    Antimicrobials: Anti-infectives (From admission, onward)    Start     Ordered Stop   11/04/21 2200  cefTRIAXone (ROCEPHIN) 2 g in sodium chloride 0.9 % 100 mL IVPB        11/04/21 1758     11/04/21 1545  piperacillin-tazobactam (ZOSYN) IVPB 3.375 g        11/04/21 1535 11/04/21 1642   11/04/21 1545  vancomycin (VANCOREADY) IVPB 1750 mg/350 mL        11/04/21 1544 11/04/21 1847         Devices    LINES / TUBES:      Continuous Infusions:  sodium chloride 75 mL/hr at 11/07/21 0151   cefTRIAXone (ROCEPHIN)  IV 2 g (11/06/21 2218)     Objective: Vitals:   11/06/21 1452 11/06/21 2146 11/07/21 0511 11/07/21 0519  BP: 128/72 109/61  117/65  Pulse: 97 93  96  Resp:  20  16  Temp: 98.2 F (36.8 C) 99.7 F (37.6 C)  97.7 F (36.5 C)  TempSrc: Oral Oral  Oral  SpO2: 94% 97%  96%  Weight:   81.7 kg   Height:        Intake/Output Summary (Last 24 hours) at 11/07/2021 1247 Last data filed at 11/07/2021 0160 Gross per 24 hour  Intake 480 ml  Output 1500 ml  Net -1020 ml    Filed Weights   11/04/21 1125 11/06/21 0553 11/07/21 0511  Weight: 81.6 kg 82.4 kg 81.7 kg    Examination:  General: A/O x4, No acute respiratory distress:  Eyes: negative scleral hemorrhage, negative anisocoria, negative icterus ENT: Negative Runny nose, negative gingival bleeding, Neck:  Negative scars, masses, torticollis,  lymphadenopathy, JVD Lungs: Clear to auscultation bilaterally without  wheezes or crackles Cardiovascular: Sinus Tachycardia and rhythm without murmur gallop or rub normal S1 and S2 Abdomen: Mild abdominal pain with palpation, nondistended, positive soft, bowel sounds, no rebound, no ascites, no appreciable mass Extremities: No significant cyanosis, clubbing, or edema bilateral lower extremities Skin: Negative rashes, lesions, ulcers Psychiatric:  Negative depression, negative anxiety, negative fatigue, negative mania  Central nervous system:  Cranial nerves II through XII intact, tongue/uvula midline, all extremities muscle strength 5/5, sensation intact throughout, negative dysarthria, negative expressive aphasia, negative receptive aphasia.  .     Data Reviewed: Care during the described time interval was provided by me .  I have reviewed this patient's available data, including medical history, events of note, physical examination, and all test results as part of my evaluation.  CBC: Recent Labs  Lab 11/04/21 1351 11/05/21 0652 11/06/21 0547 11/06/21 1612 11/07/21 0705  WBC 31.6* 30.5* 28.4* 27.8* 27.2*  NEUTROABS 27.2*  --  23.7* 24.5* 22.5*  HGB 13.2 12.0* 10.7* 10.7* 9.5*  HCT 39.8 35.7* 32.1* 32.5* 27.8*  MCV 86.3 86.0 87.0 87.8 85.5  PLT 413* 369 349 342 161    Basic Metabolic Panel: Recent Labs  Lab 11/04/21 1351 11/05/21 0652 11/05/21 0937 11/06/21 0547 11/06/21 1612 11/07/21 0705  NA 138 139  --  138 134* 137  K 4.0 4.2  --  4.1 3.8 3.5  CL 106 110  --  109 105 109  CO2 21* 20*  --  20* 18* 19*  GLUCOSE 106* 83  --  95 100* 101*  BUN 30* 18  --  '17 19 19  '$ CREATININE 1.24 1.01  --  1.00 1.03 1.17  CALCIUM 9.7 9.0  --  8.9 8.8* 8.5*  MG  --   --  1.7 1.9 1.7 1.7  PHOS  --   --  2.9 3.9 2.6 3.2    GFR: Estimated Creatinine Clearance: 65.9 mL/min (by C-G formula based on SCr of 1.17 mg/dL). Liver Function Tests: Recent Labs  Lab 11/04/21 1351  11/05/21 0652 11/06/21 0547 11/06/21 1612 11/07/21 0705  AST 12* 15 12* 13* 13*  ALT '10 10 10 11 10  '$ ALKPHOS 108 97 91 96 88  BILITOT 0.7 0.6 0.7 0.7 1.0  PROT 8.0 7.1 6.7 6.5 6.1*  ALBUMIN 3.5 3.0* 2.9* 2.8* 2.5*    No results for input(s): "LIPASE", "AMYLASE" in the last 168 hours. No results for input(s): "AMMONIA" in the last 168 hours. Coagulation Profile: No results for input(s): "INR", "PROTIME" in the last 168 hours. Cardiac Enzymes: No results for input(s): "CKTOTAL", "CKMB", "CKMBINDEX", "TROPONINI" in the last 168 hours. BNP (last 3 results) No results for input(s): "PROBNP" in the last 8760 hours. HbA1C: No results for input(s): "HGBA1C" in the last 72 hours. CBG: No results for input(s): "GLUCAP" in the last 168 hours. Lipid Profile: No results for input(s): "CHOL", "HDL", "LDLCALC", "TRIG", "CHOLHDL", "LDLDIRECT" in the last 72 hours. Thyroid Function Tests: Recent Labs    11/04/21 1928  TSH 2.424    Anemia Panel: No results for input(s): "VITAMINB12", "FOLATE", "FERRITIN", "TIBC", "IRON", "RETICCTPCT" in the last 72 hours. Sepsis Labs: Recent Labs  Lab 11/04/21 1804 11/04/21 1809 11/04/21 1928 11/05/21 0652 11/06/21 0547 11/06/21 1612 11/06/21 1909 11/06/21 2254  PROCALCITON 0.16  --   --  0.30 0.39  --   --   --   LATICACIDVEN  --    < > 0.9  --   --  1.1 1.2 0.8   < > =  values in this interval not displayed.     Recent Results (from the past 240 hour(s))  Group A Strep by PCR     Status: None   Collection Time: 11/04/21  3:07 PM   Specimen: Throat; Sterile Swab  Result Value Ref Range Status   Group A Strep by PCR NOT DETECTED NOT DETECTED Final    Comment: Performed at [facility], [place] [address] [place], [place] [address]  Blood culture (routine x 2)     Status: None (Preliminary result)   Collection Time: 11/04/21  3:42 PM   Specimen: BLOOD RIGHT HAND  Result Value Ref Range Status   Specimen Description   Final     BLOOD RIGHT HAND BLOOD Performed at [place] [address] [place], [place] [address]    Special Requests   Final    Blood Culture results may not be optimal due to an inadequate volume of blood received in culture bottles BOTTLES DRAWN AEROBIC AND ANAEROBIC Performed at [facility], [place] [address], [place], [place] [address]    Culture   Final    NO GROWTH 3 DAYS Performed at [facility], [place] [address] [place], [place] [address]    Report Status PENDING  Incomplete  Blood culture (routine x 2)     Status: None (Preliminary result)   Collection Time: 11/04/21  4:00 PM   Specimen: BLOOD RIGHT ARM  Result Value Ref Range Status   Specimen Description   Final    BLOOD RIGHT ARM BLOOD Performed at [place] [address] [place], [place] [address]    Special Requests   Final    Blood Culture adequate volume BOTTLES DRAWN AEROBIC AND ANAEROBIC Performed at [place] [address], [place], [place] [address]    Culture   Final    NO GROWTH 3 DAYS Performed at [facility], [place] [address], [place], [place] [address]    Report Status PENDING  Incomplete  Urine Culture     Status: Abnormal   Collection Time: 11/04/21 10:01 PM   Specimen: Urine, Clean Catch  Result Value Ref Range Status   Specimen Description   Final    URINE, CLEAN CATCH Performed at [facility], [place] [address], [place], [place] [address]    Special Requests   Final    NONE Performed at [facility], [place] [address] [place], [place] [address]    Culture (A)  Final    <10,000 COLONIES/mL INSIGNIFICANT GROWTH Performed at [place] [address] [place], [place] [address]    Report Status 11/06/2021 FINAL  Final  SARS Coronavirus 2 by RT PCR (hospital order, performed in [facility] hospital lab) *cepheid single result test* Anterior Nasal Swab      Status: None   Collection Time: 11/05/21  8:37 AM   Specimen: Anterior Nasal Swab  Result Value Ref Range Status   SARS Coronavirus 2 by RT PCR NEGATIVE NEGATIVE Final    Comment: (NOTE) SARS-CoV-2 target nucleic acids are NOT DETECTED.  The SARS-CoV-2 RNA is generally detectable in upper and lower respiratory specimens during the acute phase of infection. The lowest concentration of SARS-CoV-2 viral copies this assay can detect is 250 copies / mL. A negative result does not preclude SARS-CoV-2 infection and should not be used as the sole basis for treatment or other patient management decisions.  A negative  result may occur with improper specimen collection / handling, submission of specimen other than nasopharyngeal swab, presence of viral mutation(s) within the areas targeted by this assay, and inadequate number of viral copies (<250 copies / mL). A negative result must be combined with clinical observations, patient history, and epidemiological information.  Fact Sheet for Patients:   https://www.patel.info/  Fact Sheet for Healthcare Providers: https://hall.com/  This test is not yet approved or  cleared by the [place] FDA and has been authorized for detection and/or diagnosis of SARS-CoV-2 by FDA under an Emergency Use Authorization (EUA).  This EUA will remain in effect (meaning this test can be used) for the duration of the COVID-19 declaration under Section 564(b)(1) of the Act, 21 U.S.C. section 360bbb-3(b)(1), unless the authorization is terminated or revoked sooner.  Performed at [facility], [place] [address], [place], [place] [address]   Culture, blood (Routine X 2) w Reflex to ID Panel     Status: None (Preliminary result)   Collection Time: 11/06/21  4:12 PM   Specimen: BLOOD  Result Value Ref Range Status   Specimen Description   Final    BLOOD RIGHT ANTECUBITAL Performed at [place] [address] [place], [place] [address]    Special Requests   Final    BOTTLES DRAWN AEROBIC ONLY Blood Culture adequate volume Performed at [place] [address], [place], [place] [address]    Culture   Final    NO GROWTH < 12 HOURS Performed at [place] [address], [place], [place] [address]    Report Status PENDING  Incomplete  Culture, blood (Routine X 2) w Reflex to ID Panel     Status: None (Preliminary result)   Collection Time: 11/06/21  4:12 PM   Specimen: BLOOD  Result Value Ref Range Status   Specimen Description   Final    BLOOD LEFT ANTECUBITAL Performed at [facility] [address], [place], [place] [address]    Special Requests   Final    BOTTLES DRAWN AEROBIC ONLY Blood Culture adequate volume Performed at [place] [address], [place], [place] [address]    Culture   Final    NO GROWTH < 12 HOURS Performed at [place] [address] [place], [place] [address]    Report Status PENDING  Incomplete         Radiology Studies: DG CHEST PORT 1 VIEW  Result Date: 11/06/2021 CLINICAL DATA:  Shortness of breath EXAM: PORTABLE CHEST 1 VIEW COMPARISON:  Portable exam 1529 hours compared to 11/04/2021 FINDINGS: RIGHT jugular Port-A-Cath with tip projecting over SVC. Normal heart size, mediastinal contours, and pulmonary vascularity. Lungs clear. No infiltrate, pleural effusion, or pneumothorax. Osseous structures unremarkable. IMPRESSION: No acute abnormalities. Electronically Signed   By: Lavonia Dana M.D.   On: 11/06/2021 15:35        Scheduled Meds:  ALPRAZolam  1 mg Oral QHS   And   ALPRAZolam  0.5 mg Oral Daily   apixaban  5 mg Oral BID   darifenacin  7.5 mg Oral Daily   docusate sodium  100 mg Oral BID   feeding supplement  237 mL Oral QID   gabapentin  600 mg Oral TID   levothyroxine  50 mcg Oral QAC breakfast   multivitamin with  minerals  1 tablet Oral Daily   pantoprazole  40 mg Oral BID   pravastatin  40 mg Oral Daily   tamsulosin  0.4 mg Oral BID   Continuous Infusions:  sodium chloride 75 mL/hr at 11/07/21 0151   cefTRIAXone (ROCEPHIN)  IV 2 g (11/06/21 2218)     LOS: 2 days    Time spent:40 min    Deshunda Thackston, Geraldo Docker, MD [facility]   If 7PM-7AM, please contact night-coverage 11/07/2021, 12:47 PM

## 2021-11-07 NOTE — Progress Notes (Signed)
PT Cancellation Note  Patient Details Name: Marvin Gill MRN: [id number] DOB: 07/05/1950   Cancelled Treatment:    Reason Eval/Treat Not Completed: Medical issues which prohibited therapy (OT reports pt orthostatic with EOB activity, will hold and follow up as schedule allows and pt able. (82/55 mmHg))   Verner Mould, DPT Acute Rehabilitation Services Office [phone] Pager [phone]  11/07/21 1:51 PM

## 2021-11-07 NOTE — Evaluation (Addendum)
Occupational Therapy Evaluation Patient Details Name: Marvin Gill MRN: [id number] DOB: 1951/05/10 Today's Date: 11/07/2021   History of Present Illness patient is a 71 year old male who presented to the hosptial adter fall at home with syncopal episodes and increased shortness of breath. patient was admitted with dyspnea on exertion, syncope, orthostatic hypotension and acute diastolic CHF.   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 Patient was living at home alone previously. Currently, patients session was limited by orthostatics with attempted to sit EOB.patients blood pressure in supine was 100/64 mmhg.  Patient's BP was noted to drop to 82/55 mmhg sitting EOB with patient denying being symptomatic. Patient was returned to supine. BP when back in bed was 110/ 62 mmhg. Patient was noted to have urinal with dark colored urine at edge of bed with nurse made aware on above.  Patients O2 maintained in 90s on 2L/min during session. Patient would continue to benefit from skilled OT services at this time while admitted and after d/c to address noted deficits in order to improve overall safety and independence in ADLs.       Recommendations for follow up therapy are one component of a multi-disciplinary discharge planning process, led by the attending physician.  Recommendations may be updated based on patient status, additional functional criteria and insurance authorization.   Follow Up Recommendations  Acute inpatient rehab (3hours/day)    Assistance Recommended at Discharge Frequent or constant Supervision/Assistance  Patient can return home with  the following Two people to help with walking and/or transfers;Two people to help with bathing/dressing/bathroom;Direct supervision/assist for medications management;Help with stairs or ramp for entrance;Assist for transportation;Assistance with cooking/housework;Direct supervision/assist for financial management;Assistance with feeding    Functional Status Assessment  Patient has had a recent decline in their functional status and demonstrates the ability to make significant improvements in function in a reasonable and predictable amount of time.  Equipment Recommendations  Other (comment) (defer to next venue)    Recommendations for Other Services       Precautions / Restrictions Precautions Precautions: Fall Precaution Comments: monitor BP and O2, orthostatic Restrictions Weight Bearing Restrictions: No      Mobility Bed Mobility Overal bed mobility: Needs Assistance Bed Mobility: Sit to Supine, Supine to Sit     Supine to sit: Mod assist Sit to supine: Min assist   General bed mobility comments: patient neeeded physical asssit to scoot to edge of bed with assist to bring trunk into midline with education for BUE placement. patient needed assist to get BLE back into bed.    Transfers                          Balance Overall balance assessment: Mild deficits observed, not formally tested                                         ADL either performed or assessed with clinical judgement   ADL Overall ADL's : Needs assistance/impaired Eating/Feeding: Minimal assistance;Bed level   Grooming:  Minimal assistance;Bed level   Upper Body Bathing: Bed level;Moderate assistance   Lower Body Bathing: Bed level;Maximal assistance   Upper Body Dressing : Moderate assistance;Bed level   Lower Body Dressing: Bed level;Maximal assistance     Toilet Transfer Details (indicate cue type and reason): unable to attempt standing with BP 82/55 mmhg sitting  EOB with patient denying being symptomatic. noted to have very dark urine in urinal at end of bed. nurse made aware. Toileting- Clothing Manipulation and Hygiene: Total assistance;Bed level               Vision Baseline Vision/History: 1 Wears glasses Patient Visual Report: No change from baseline Additional Comments: hard to assess patient with increased confusion     Perception     Praxis      Pertinent Vitals/Pain Pain Assessment Pain Assessment: No/denies pain     Hand Dominance     Extremity/Trunk Assessment Upper Extremity Assessment Upper Extremity Assessment: Generalized weakness   Lower Extremity Assessment Lower Extremity Assessment: Defer to PT evaluation   Cervical / Trunk Assessment Cervical / Trunk Assessment: Normal   Communication Communication Communication: No difficulties   Cognition Arousal/Alertness: Lethargic Behavior During Therapy: Flat affect Overall Cognitive Status: No family/caregiver present to determine baseline cognitive functioning                                 General Comments: patient was noted to have delayed responses to all questions with "dazed" apperance during session.     General Comments       Exercises     Shoulder Instructions      Home Living Family/patient expects to be discharged to:: Private residence Living Arrangements: Alone Available Help at Discharge: Family;Available PRN/intermittently Type of Home: House Home Access: Stairs to enter Entrance Stairs-Number of Steps: 4   Home Layout: One level     Bathroom Shower/Tub: Walk-in shower;Tub/shower unit         Home Equipment: Rollator (4 wheels)          Prior Functioning/Environment Prior Level of Function : Independent/Modified Independent                        OT Problem List: Decreased activity tolerance;Impaired balance (sitting and/or standing);Decreased safety awareness;Cardiopulmonary status limiting  activity;Decreased knowledge of precautions;Decreased knowledge of use of DME or AE      OT Treatment/Interventions: Self-care/ADL training;Therapeutic exercise;Neuromuscular education;Energy conservation;DME and/or AE instruction;Therapeutic activities;Balance training;Patient/family education    OT Goals(Current goals can be found in the care plan section) Acute Rehab OT Goals Patient Stated Goal: none stated OT Goal Formulation: Patient unable to participate in goal setting Time For Goal Achievement: 11/21/21 Potential to Achieve Goals: Fair  OT Frequency: Min 2X/week    Co-evaluation              AM-PAC OT "6 Clicks" Daily Activity     Outcome Measure Help from another person eating meals?: A Little Help from another person taking care of personal grooming?: A Little Help from another person toileting, which includes using toliet, bedpan, or urinal?: A Lot Help from another person bathing (including washing, rinsing, drying)?: A Lot Help from another person to put on and taking off regular upper body clothing?: A Lot Help from another person to put on and taking off regular lower body clothing?: A Lot 6 Click Score: 14   End of Session Nurse Communication:  Other (comment) (vitals during session, concerns over urine color)  Activity Tolerance: Treatment limited secondary to medical complications (Comment) (orthostatic) Patient left: in bed;with call bell/phone within reach;with bed alarm set  OT Visit Diagnosis: Unsteadiness on feet (R26.81);History of falling (Z91.81);Muscle weakness (generalized) (M62.81)                Time: 5300-5110 OT Time Calculation (min): 18 min Charges:  OT General Charges $OT Visit: 1 Visit OT Evaluation $OT Eval Moderate Complexity: 1 Mod  Jackelyn Poling OTR/L, MS Acute Rehabilitation Department Office# [phone] Pager# [phone]   Marcellina Millin 11/07/2021, 1:56 PM

## 2021-11-07 NOTE — Progress Notes (Addendum)
Primary RN notified by telemetry @ 1845 that patient's HR was reading in the 130-160s with an occasional jump into the 170s. Patient assessed by RN at bedside and set of VS obtained by NT. VS noted to be consistent with telemetry reading. Provider notified and order received for STAT EKG; procedure performed and provider paged with results. Provider came to bedside to assess patient status and gave order to administer Adenosine. Per policy, patient required to be admitted to ICU for Adenosine administration; patient transported to ICU by primary RN.

## 2021-11-08 DIAGNOSIS — D72829 Elevated white blood cell count, unspecified: Secondary | ICD-10-CM | POA: Diagnosis not present

## 2021-11-08 DIAGNOSIS — R0600 Dyspnea, unspecified: Secondary | ICD-10-CM | POA: Diagnosis not present

## 2021-11-08 DIAGNOSIS — N401 Enlarged prostate with lower urinary tract symptoms: Secondary | ICD-10-CM | POA: Diagnosis not present

## 2021-11-08 DIAGNOSIS — C670 Malignant neoplasm of trigone of bladder: Secondary | ICD-10-CM | POA: Diagnosis not present

## 2021-11-08 DIAGNOSIS — I472 Ventricular tachycardia, unspecified: Secondary | ICD-10-CM | POA: Diagnosis not present

## 2021-11-08 LAB — COMPREHENSIVE METABOLIC PANEL
ALT: 11 U/L (ref 0–44)
AST: 15 U/L (ref 15–41)
Albumin: 2.4 g/dL — ABNORMAL LOW (ref 3.5–5.0)
Alkaline Phosphatase: 89 U/L (ref 38–126)
Anion gap: 11 (ref 5–15)
BUN: 17 mg/dL (ref 8–23)
CO2: 16 mmol/L — ABNORMAL LOW (ref 22–32)
Calcium: 8.4 mg/dL — ABNORMAL LOW (ref 8.9–10.3)
Chloride: 110 mmol/L (ref 98–111)
Creatinine, Ser: 1.04 mg/dL (ref 0.61–1.24)
GFR, Estimated: >60 mL/min (ref >60)
Glucose, Bld: 107 mg/dL — ABNORMAL HIGH (ref 70–99)
Potassium: 3.2 mmol/L — ABNORMAL LOW (ref 3.5–5.1)
Sodium: 137 mmol/L (ref 135–145)
Total Bilirubin: 0.7 mg/dL (ref 0.3–1.2)
Total Protein: 6 g/dL — ABNORMAL LOW (ref 6.5–8.1)

## 2021-11-08 LAB — URINE CULTURE: Culture: NO GROWTH

## 2021-11-08 LAB — CBC WITH DIFFERENTIAL/PLATELET
Abs Immature Granulocytes: 0.26 10*3/uL — ABNORMAL HIGH (ref 0.00–0.07)
Basophils Absolute: 0.1 10*3/uL (ref 0.0–0.1)
Basophils Relative: 0 %
Eosinophils Absolute: 0 10*3/uL (ref 0.0–0.5)
Eosinophils Relative: 0 %
HCT: 28 % — ABNORMAL LOW (ref 39.0–52.0)
Hemoglobin: 9.6 g/dL — ABNORMAL LOW (ref 13.0–17.0)
Immature Granulocytes: 1 %
Lymphocytes Relative: 8 %
Lymphs Abs: 2 10*3/uL (ref 0.7–4.0)
MCH: 29.2 pg (ref 26.0–34.0)
MCHC: 34.3 g/dL (ref 30.0–36.0)
MCV: 85.1 fL (ref 80.0–100.0)
Monocytes Absolute: 2 10*3/uL — ABNORMAL HIGH (ref 0.1–1.0)
Monocytes Relative: 8 %
Neutro Abs: 21.6 10*3/uL — ABNORMAL HIGH (ref 1.7–7.7)
Neutrophils Relative %: 83 %
Platelets: 317 10*3/uL (ref 150–400)
RBC: 3.29 MIL/uL — ABNORMAL LOW (ref 4.22–5.81)
RDW: 14.5 % (ref 11.5–15.5)
WBC: 25.9 10*3/uL — ABNORMAL HIGH (ref 4.0–10.5)
nRBC: 0 % (ref 0.0–0.2)

## 2021-11-08 LAB — MAGNESIUM: Magnesium: 1.8 mg/dL (ref 1.7–2.4)

## 2021-11-08 LAB — TSH: TSH: 2.248 u[IU]/mL (ref 0.350–4.500)

## 2021-11-08 LAB — PHOSPHORUS: Phosphorus: 2.7 mg/dL (ref 2.5–4.6)

## 2021-11-08 MED ORDER — SODIUM CHLORIDE 0.9% FLUSH
10.0000 mL | Freq: Two times a day (BID) | INTRAVENOUS | Status: DC
  Administered 2021-11-08 – 2021-11-12 (×4): 10 mL

## 2021-11-08 MED ORDER — SODIUM CHLORIDE 0.9% FLUSH: 10.0000 mL | INTRAVENOUS | Status: DC | PRN

## 2021-11-08 MED ORDER — DIGOXIN 0.25 MG/ML IJ SOLN
0.2500 mg | Freq: Once | INTRAMUSCULAR | Status: AC
  Administered 2021-11-08: 0.25 mg via INTRAVENOUS
  Filled 2021-11-08: qty 2

## 2021-11-08 MED ORDER — ALBUMIN HUMAN 25 % IV SOLN
50.0000 g | Freq: Once | INTRAVENOUS | Status: AC
  Administered 2021-11-08: 50 g via INTRAVENOUS
  Filled 2021-11-08: qty 200

## 2021-11-08 MED ORDER — PHENOL 1.4 % MT LIQD
1.0000 | OROMUCOSAL | Status: DC | PRN
  Administered 2021-11-08 (×2): 1 via OROMUCOSAL
  Filled 2021-11-08: qty 177

## 2021-11-08 MED ORDER — POTASSIUM CHLORIDE CRYS ER 20 MEQ PO TBCR
40.0000 meq | EXTENDED_RELEASE_TABLET | Freq: Two times a day (BID) | ORAL | Status: AC
Start: 2021-11-08 — End: 2021-11-08
  Administered 2021-11-08 (×2): 40 meq via ORAL
  Filled 2021-11-08 (×2): qty 2

## 2021-11-08 NOTE — Progress Notes (Signed)
Inpatient Rehab Admissions Coordinator:   Per therapy recommendations, patient was screened for CIR candidacy by Clemens Catholic, MS, CCC-SLP. At this time, Pt. does not appear to demonstrate medical necessity to justify in hospital rehabilitation/CIR.  Additionally, Pt. Is not yet tolerating OOB and payor is unlikely to approve for this diagnosis. I will not pursue a rehab consult for this Pt.   Recommend other rehab venues to be pursued.  Please contact me with any questions.  Clemens Catholic, [place], [place] Admissions Coordinator  [phone] ([place] [place]) [phone] (office)

## 2021-11-08 NOTE — Progress Notes (Signed)
PROGRESS NOTE    Marvin Gill  VPX:[id number] DOB: 07/04/1950 DOA: 11/04/2021 PCP: Marvin Orn, MD     Brief Narrative:   71 y.o. 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    He presents with progressive worsening shortness of breath on exertion over the last 2 months but getting progressively worse, dyspnea has progressed to minimal exertion, with just  few steps.  He denies chest pain.  He also report dizziness on standing and on ambulation.  He has fallen multiple times.  He passed out also multiple times per daughter, last episode on Sunday, he fell yesterday.    He also report 50 pounds weight loss.  Reports poor oral intake.  He has abdominal pain since after the surgery, not getting worse.  He has not felt the same since after the surgery.   He was off Eliquis for 2 weeks. He started to take eliquis  yesterday as recommended by his urology.  Eliquis was on hold due to hematuria.  He had an episode of bloody urine in the ED.   Subjective: 6/11 Tmax overnight 38.7 C.  HR remains mildly elevated negative CP, negative SOB    Assessment & Plan: Covid vaccination;   Principal Problem:   Dyspnea Active Problems:   Benign localized prostatic hyperplasia with lower urinary tract symptoms (LUTS)   PAF (paroxysmal atrial fibrillation) (HCC)   Bladder cancer (HCC)   COPD with emphysema (HCC)   GERD (gastroesophageal reflux disease)   Hypothyroidism   Dyspnea on exertion   Leukocytosis   Protein-calorie malnutrition, severe   Ventricular tachycardia (HCC)  Dyspnea on exertion:  -He presents complaining of dyspnea on exertion, progressively getting worse.  -Oxygen sat drop from 99 to 70 during  orthostatic vitals.  -He has been off Eliquis for 2 weeks. Just resume yesterday.  -Troponin negative times 2.  -Hb at 13.  -6/7 CT chest PE protocol negative for PE.  See results below.  NOTE will need repeat CT scan in 3 months -6/8 Echocardiogram: Diastolic CHF see results below  -He will need Oxygen on ambulation.    Syncope, frequent fall. Suspect related to orthostatic Hypotension.  -Most likely multifactorial dehydration, A-fib RVR?  Orthostatic hypotension?  -6/7 TSH WNL -6/8 AM cortisol WNL -Orthostatic vitals every shift  Acute Diastolic CHF - 6/9 see echocardiogram below -Strict in and out -392.69m - Daily weight Filed Weights   11/07/21 0511 11/07/21 1930 11/08/21 0304  Weight: 81.7 kg 81 kg 81.1 kg  -6/10 Amiodarone loading dose plus drip -6/11 patient remains in A-fib RVR, borderline BP.  Asymptomatic. - 6/11 Digoxin IV 0.25 mg x 1 -6/11 titrate Amiodarone drip off as tolerated  PAF;  -Resume Eliquis.  -See diastolic CHF  V. tach - 64/62patient had V. tach 170-180 stable (negative CP, negative SOB, cognition intact.) - See acute diastolic CHF  Leukocytosis, left shift.  Lab Results  Component Value Date   WBC 25.9 (H) 11/08/2021   WBC 27.2 (H) 11/07/2021   WBC 27.8 (H) 11/06/2021   WBC 28.4 (H) 11/06/2021   WBC 30.5 (H) 11/05/2021  -6/7 cultures NGTD -6/9 PCXR negative -6/9 UA pending, blood culture urine culture; NGTD as of 6/10 -6/9 check pro-calcitonin.= 0.39 -6/9 lactic acid =0.8 -Continue  with IV antibiotics: IV ceftriaxone.  -6/9 plan to rule out infection.  -6/10 most likely secondary to his high-grade cancer however will continue antibiotic until cultures negative  5-T- 3 High grade  urothelial carcinoma of left distal ureter:  -Dr. Zola Gill oncology aware of patient's admission per admission note. -Status post cystoscopy with left ureteral stent placement and TUR of the left  ureteral orifice, robotic assisted laparoscopic left nephro  ureterectomy 09/01/2021 -He has chronic abdominal pain after Surgery.  -If leukocytosis persist, pro-calcitonin elevated and no other source of infection, could consider CT abdomen pelvis.  -6/9 obtain CT abdomen pelvis in A.m. if urine and blood cultures negative   Hypothyroidism;  -Synthroid 50 mcg daily.  -6/7 TSH = 2.4 WNL    FTT;  -Daughter is concern about high dose gabapentin. Plan to reduce gabapentin to 600 mg.    Sore throat:  -Strept A negative.  -6/8 resolved   GERD;  PPI BID>    Severe protein calorie malnutrition - 6/9 continue feeding supplements per nutrition recommendation  Hypokalemia - Potassium goal> 4 - 6/11 K-Dur 40 mEq x 2  Hypoalbuminemia - 6/11 albumin 50 g x 1  Goals of care - 6/10 PT/OT consult patient with High grade  urothelial carcinoma of left distal ureter; extremely weak evaluate for CIR vs SNF    Mobility Assessment (last 72 hours)     Mobility Assessment     Row Name 11/07/21 1354 11/07/21 0928 11/06/21 2000 11/05/21 2000 11/05/21 0853   Does patient have an order for bedrest or is patient medically unstable -- No - Continue assessment No - Continue assessment No - Continue assessment No - Continue assessment   What is the highest level of mobility based on the progressive mobility assessment? Level 2 (Chairfast) - Balance while sitting on edge of bed and cannot stand Level 5 (Walks with assist in room/hall) - Balance while stepping forward/back and can walk in room with assist - Complete Level 5 (Walks with assist in room/hall) - Balance while stepping forward/back and can walk in room with assist - Complete Level 5 (Walks with assist in room/hall) - Balance while stepping forward/back and can walk in room with assist - Complete Level 5 (Walks with assist in room/hall) - Balance while stepping forward/back and can walk in room with assist - Complete              Interdisciplinary Goals of Care Family Meeting   Date carried out:  11/08/2021  Location of the meeting:   Member's involved:   Durable Power of Tour manager:     Discussion: We discussed goals of care for Marvin Gill .   Code status:   Disposition:   Time spent for the meeting:     Marvin Rhines J, MD  11/08/2021, 8:11 AM         DVT prophylaxis: Eliquis Code Status: Full Family Communication:  Status is: Inpatient    Dispo: The patient is from: Home              Anticipated d/c is to: Home              Anticipated d/c date is: 3 days              Patient currently is not medically stable to d/c.      Consultants:    Procedures/Significant Events:  6/7 CTA chest PE protocol No evidence for pulmonary embolism. 2. New  focal parenchymal opacity in the medial left lung apex measuring 1.6 x 0.9 x 1.8 cm. Findings are indeterminate and may represent infectious/inflammatory process Consider one of the following in 3 months for both low-risk and high-risk individuals: (a) repeat chest CT, (b) follow-up PET-CT, or (c) tissue sampling. 6/8 echocardiogram Left Ventricle: LVEF= 55 to 60%. The left ventricle has no WMA Left ventricular diastolic parameters are consistent with Grade I diastolic dysfunction (impaired relaxation).  6/9 PCXR: No acute findings   I have personally reviewed and interpreted all radiology studies and my findings are as above.  VENTILATOR SETTINGS: Room air 6/11 SPO2 96%   Cultures 6/7 group A strep by PCR negative 6/7 blood pending 6/7 blood pending 6/7 urine positive insignificant growth 6/8 SARS coronavirus negative 6/9 blood left AC NGTD  6/9 blood RIGHT AC NGTD 6/9 urine pending    Antimicrobials: Anti-infectives (From admission, onward)    Start     Ordered Stop   11/04/21 2200  cefTRIAXone (ROCEPHIN) 2 g in sodium chloride 0.9 % 100 mL IVPB        11/04/21 1758     11/04/21 1545  piperacillin-tazobactam (ZOSYN) IVPB 3.375 g        11/04/21 1535  11/04/21 1642   11/04/21 1545  vancomycin (VANCOREADY) IVPB 1750 mg/350 mL        11/04/21 1544 11/04/21 1847         Devices    LINES / TUBES:      Continuous Infusions:  sodium chloride 75 mL/hr at 11/08/21 0753   amiodarone 30 mg/hr (11/08/21 0707)   cefTRIAXone (ROCEPHIN)  IV Stopped (11/07/21 2137)     Objective: Vitals:   11/08/21 0400 11/08/21 0500 11/08/21 0600 11/08/21 0700  BP: 112/62 109/66 (!) 95/57 (!) 94/58  Pulse:    96  Resp: (!) 29 (!) 33 (!) 24 (!) 29  Temp: 98.8 F (37.1 C)     TempSrc: Oral     SpO2: 99% 99%  96%  Weight:      Height:        Intake/Output Summary (Last 24 hours) at 11/08/2021 0811 Last data filed at 11/08/2021 0734 Gross per 24 hour  Intake 5686.62 ml  Output 1970 ml  Net 3716.62 ml   Filed Weights   11/07/21 0511 11/07/21 1930 11/08/21 0304  Weight: 81.7 kg 81 kg 81.1 kg    Examination:  General: A/O x4, No acute respiratory distress:  Eyes: negative scleral hemorrhage, negative anisocoria, negative icterus ENT: Negative Runny nose, negative gingival bleeding, Neck:  Negative scars, masses, torticollis, lymphadenopathy, JVD Lungs: Clear to auscultation bilaterally without wheezes or crackles Cardiovascular: Irregular irregular rhythm and rate without murmur gallop or rub normal S1 and S2 Abdomen: Mild abdominal pain with palpation, nondistended, positive soft, bowel sounds, no rebound, no ascites, no appreciable mass Extremities: No significant cyanosis, clubbing, or edema bilateral lower extremities Skin: Negative rashes, lesions, ulcers Psychiatric:  Negative depression, negative anxiety, negative fatigue, negative mania  Central nervous system:  Cranial nerves II through XII intact, tongue/uvula midline, all extremities muscle strength 5/5, sensation intact throughout, negative dysarthria, negative expressive aphasia, negative receptive aphasia.  .     Data Reviewed: Care during the described time interval was  provided by me .  I have reviewed this patient's available data, including medical history, events of note, physical examination, and all test results as part of my evaluation.  CBC: Recent Labs  Lab 11/04/21 1351 11/05/21 0652 11/06/21 0547 11/06/21 1612 11/07/21  8416 11/08/21 0243  WBC 31.6* 30.5* 28.4* 27.8* 27.2* 25.9*  NEUTROABS 27.2*  --  23.7* 24.5* 22.5* 21.6*  HGB 13.2 12.0* 10.7* 10.7* 9.5* 9.6*  HCT 39.8 35.7* 32.1* 32.5* 27.8* 28.0*  MCV 86.3 86.0 87.0 87.8 85.5 85.1  PLT 413* 369 349 342 325 606   Basic Metabolic Panel: Recent Labs  Lab 11/06/21 0547 11/06/21 1612 11/07/21 0705 11/07/21 1927 11/08/21 0243  NA 138 134* 137 134* 137  K 4.1 3.8 3.5 3.6 3.2*  CL 109 105 109 108 110  CO2 20* 18* 19* 16* 16*  GLUCOSE 95 100* 101* 98 107*  BUN '17 19 19 17 17  '$ CREATININE 1.00 1.03 1.17 1.06 1.04  CALCIUM 8.9 8.8* 8.5* 8.4* 8.4*  MG 1.9 1.7 1.7 1.7 1.8  PHOS 3.9 2.6 3.2 2.7 2.7   GFR: Estimated Creatinine Clearance: 73.6 mL/min (by C-G formula based on SCr of 1.04 mg/dL). Liver Function Tests: Recent Labs  Lab 11/05/21 0652 11/06/21 0547 11/06/21 1612 11/07/21 0705 11/08/21 0243  AST 15 12* 13* 13* 15  ALT '10 10 11 10 11  '$ ALKPHOS 97 91 96 88 89  BILITOT 0.6 0.7 0.7 1.0 0.7  PROT 7.1 6.7 6.5 6.1* 6.0*  ALBUMIN 3.0* 2.9* 2.8* 2.5* 2.4*   No results for input(s): "LIPASE", "AMYLASE" in the last 168 hours. No results for input(s): "AMMONIA" in the last 168 hours. Coagulation Profile: No results for input(s): "INR", "PROTIME" in the last 168 hours. Cardiac Enzymes: No results for input(s): "CKTOTAL", "CKMB", "CKMBINDEX", "TROPONINI" in the last 168 hours. BNP (last 3 results) No results for input(s): "PROBNP" in the last 8760 hours. HbA1C: No results for input(s): "HGBA1C" in the last 72 hours. CBG: No results for input(s): "GLUCAP" in the last 168 hours. Lipid Profile: No results for input(s): "CHOL", "HDL", "LDLCALC", "TRIG", "CHOLHDL", "LDLDIRECT"  in the last 72 hours. Thyroid Function Tests: No results for input(s): "TSH", "T4TOTAL", "FREET4", "T3FREE", "THYROIDAB" in the last 72 hours.  Anemia Panel: No results for input(s): "VITAMINB12", "FOLATE", "FERRITIN", "TIBC", "IRON", "RETICCTPCT" in the last 72 hours. Sepsis Labs: Recent Labs  Lab 11/04/21 1804 11/04/21 1809 11/04/21 1928 11/05/21 0652 11/06/21 0547 11/06/21 1612 11/06/21 1909 11/06/21 2254  PROCALCITON 0.16  --   --  0.30 0.39  --   --   --   LATICACIDVEN  --    < > 0.9  --   --  1.1 1.2 0.8   < > = values in this interval not displayed.    Recent Results (from the past 240 hour(s))  Group A Strep by PCR     Status: None   Collection Time: 11/04/21  3:07 PM   Specimen: Throat; Sterile Swab  Result Value Ref Range Status   Group A Strep by PCR NOT DETECTED NOT DETECTED Final    Comment: Performed at [facility], [place] [address], [place], [place] [address]  Blood culture (routine x 2)     Status: None (Preliminary result)   Collection Time: 11/04/21  3:42 PM   Specimen: BLOOD RIGHT HAND  Result Value Ref Range Status   Specimen Description   Final    BLOOD RIGHT HAND BLOOD Performed at [facility] [address], [place], [place] [address]    Special Requests   Final    Blood Culture results may not be optimal due to an inadequate volume of blood received in culture bottles [place] Performed at [facility]  [facility], [place] [address], [place], [place] [address]    Culture   Final    NO GROWTH 3 DAYS Performed at [facility], [place] [address], [place], [place] [address]    Report Status PENDING  Incomplete  Blood culture (routine x 2)     Status: None (Preliminary result)   Collection Time: 11/04/21  4:00 PM   Specimen: BLOOD RIGHT ARM  Result Value Ref Range Status   Specimen Description   Final    BLOOD RIGHT ARM BLOOD Performed at [facility] [address], [place], [place] [address]    Special Requests   Final    Blood Culture adequate volume BOTTLES DRAWN AEROBIC AND ANAEROBIC Performed at [facility] [address] [place], [place] [address]    Culture   Final    NO GROWTH 3 DAYS Performed at [facility], [place] [address], [place], [place] [address]    Report Status PENDING  Incomplete  Urine Culture     Status: Abnormal   Collection Time: 11/04/21 10:01 PM   Specimen: Urine, Clean Catch  Result Value Ref Range Status   Specimen Description   Final    URINE, CLEAN CATCH Performed at [facility], [place] [address], [place], [place] [address]    Special Requests   Final    NONE Performed at [facility], [place] [address] [place], [place] [address]    Culture (A)  Final    <10,000 COLONIES/mL INSIGNIFICANT GROWTH Performed at [facility] [address] [place], [place] [address]    Report Status 11/06/2021 FINAL  Final  SARS Coronavirus 2 by RT PCR (hospital order, performed in [facility] hospital lab) *cepheid single result test* Anterior Nasal Swab     Status: None   Collection Time: 11/05/21  8:37 AM   Specimen: Anterior Nasal Swab  Result Value Ref Range Status   SARS Coronavirus 2 by RT PCR NEGATIVE NEGATIVE Final    Comment: (NOTE) SARS-CoV-2 target nucleic acids are NOT DETECTED.  The SARS-CoV-2 RNA is generally detectable in upper and lower respiratory specimens during the acute phase of infection. The lowest concentration of SARS-CoV-2 viral copies this assay can detect is 250 copies / mL. A negative result does not preclude SARS-CoV-2 infection and should not be used as the sole basis for treatment or other patient management decisions.  A negative result may occur with improper specimen collection / handling, submission of specimen other than nasopharyngeal swab, presence of viral mutation(s) within the areas targeted by this assay,  and inadequate number of viral copies (<250 copies / mL). A negative result must be combined with clinical observations, patient history, and epidemiological information.  Fact Sheet for Patients:   https://www.patel.info/  Fact Sheet for Healthcare Providers: https://hall.com/  This test is not yet approved or  cleared by the [place] FDA and has been authorized for detection and/or diagnosis of SARS-CoV-2 by FDA under an Emergency Use Authorization (EUA).  This EUA will remain in effect (meaning this test can be used) for the duration of the COVID-19 declaration under Section 564(b)(1) of the Act, 21 U.S.C. section 360bbb-3(b)(1), unless the authorization is terminated or revoked sooner.  Performed at [facility], [place] [address] [place], [place] [address]   Culture, blood (Routine X 2) w Reflex to ID Panel     Status: None (Preliminary result)   Collection Time: 11/06/21  4:12 PM   Specimen: BLOOD  Result Value Ref Range Status   Specimen Description   Final    BLOOD RIGHT ANTECUBITAL Performed at [facility] [address], [place], [place] [address]    Special Requests   Final    BOTTLES DRAWN AEROBIC ONLY Blood Culture adequate volume Performed at [facility] [address], [place], [place] [address]    Culture   Final    NO GROWTH < 12 HOURS Performed at [facility] [address] [place], [place] [address]    Report Status PENDING  Incomplete  Culture, blood (Routine X 2) w Reflex to ID Panel     Status: None (Preliminary result)   Collection Time: 11/06/21  4:12 PM   Specimen: BLOOD  Result Value Ref Range Status   Specimen Description   Final    BLOOD LEFT ANTECUBITAL Performed at [facility] [address] [place], [place] [address]    Special Requests   Final    BOTTLES DRAWN AEROBIC ONLY Blood Culture adequate  volume Performed at [facility] [address] [place], [place] [address]    Culture   Final    NO GROWTH < 12 HOURS Performed at [facility] [address] [place], [place] [address]    Report Status PENDING  Incomplete         Radiology Studies: DG CHEST PORT 1 VIEW  Result Date: 11/06/2021 CLINICAL DATA:  Shortness of breath EXAM: PORTABLE CHEST 1 VIEW COMPARISON:  Portable exam 1529 hours compared to 11/04/2021 FINDINGS: RIGHT jugular Port-A-Cath with tip projecting over SVC. Normal heart size, mediastinal contours, and pulmonary vascularity. Lungs clear. No infiltrate, pleural effusion, or pneumothorax. Osseous structures unremarkable. IMPRESSION: No acute abnormalities. Electronically Signed   By: Lavonia Dana M.D.   On: 11/06/2021 15:35        Scheduled Meds:  ALPRAZolam  1 mg Oral QHS   And   ALPRAZolam  0.5 mg Oral Daily   apixaban  5 mg Oral BID   Chlorhexidine Gluconate Cloth  6 each Topical Daily   darifenacin  7.5 mg Oral Daily   docusate sodium  100 mg Oral BID   feeding supplement  237 mL Oral QID   gabapentin  600 mg Oral TID   levothyroxine  50 mcg Oral QAC breakfast   multivitamin with minerals  1 tablet Oral Daily   pantoprazole  40 mg Oral BID   pravastatin  40 mg Oral Daily   tamsulosin  0.4 mg Oral BID   Continuous Infusions:  sodium chloride 75 mL/hr at 11/08/21 0753   amiodarone 30 mg/hr (11/08/21 0707)   cefTRIAXone (ROCEPHIN)  IV Stopped (11/07/21 2137)     LOS: 3 days    Time spent:40 min    Niv Darley, Geraldo Docker, MD [facility]   If 7PM-7AM, please contact night-coverage 11/08/2021, 8:11 AM

## 2021-11-08 NOTE — Evaluation (Signed)
Physical Therapy Evaluation Patient Details Name: Marvin Gill MRN: [id number] DOB: 06-Apr-1951 Today's Date: 11/08/2021  History of Present Illness  patient is a 71 year old male who presented to the hosptial adter fall at home with syncopal episodes and increased shortness of breath. patient was admitted with dyspnea on exertion, syncope, orthostatic hypotension and acute diastolic CHF.   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  This date, pt mildly orthostatic and denying symptoms but becoming increasingly unsteady and unsafe; BP supine 109/54; sit 97/55; after standing and needing to sit prior to BP completed 96/45 - RN aware.  Pt would benefit from follow up AIR level rehab to maximize IND and safety prior to dc home alone with limited assist.     Recommendations for follow up therapy are one component of a multi-disciplinary discharge planning process, led by the attending physician.  Recommendations may be updated based on patient status, additional functional criteria and insurance authorization.  Follow Up Recommendations Acute inpatient rehab (3hours/day)    Assistance Recommended at Discharge Frequent or constant Supervision/Assistance  Patient can return home with the following  A lot of help with walking and/or transfers;A little help with bathing/dressing/bathroom;Assist for transportation;Help with stairs or ramp for entrance;Assistance with cooking/housework    Equipment Recommendations None recommended by PT  Recommendations for Other  Services       Functional Status Assessment Patient has had a recent decline in their functional status and demonstrates the ability to make significant improvements in function in a reasonable and predictable amount of time.     Precautions / Restrictions Precautions Precautions: Fall Precaution Comments: monitor BP and O2, orthostatic Restrictions Weight Bearing Restrictions: No      Mobility  Bed Mobility Overal bed mobility: Needs Assistance Bed Mobility: Supine to Sit     Supine to sit: Min assist     General bed mobility comments: Increased time with min assist to bring trunk to upright    Transfers Overall transfer level: Needs assistance Equipment used: Rolling walker (2 wheels) Transfers: Sit to/from Stand, Bed to chair/wheelchair/BSC Sit to Stand: Min assist, +2 physical assistance, +2 safety/equipment, From elevated surface   Step pivot transfers: Min assist, +2 safety/equipment, From elevated surface       General transfer comment: Assist to bring wt up and fwd and to balance in standing; stand pvt bed to recliner to [facility]    Ambulation/Gait               General Gait Details: To recliner and BSC only -pt very unsteady and with difficulty following cues consistently  Stairs            Wheelchair Mobility    Modified Rankin (Stroke Patients Only)       Balance Overall balance assessment: Needs assistance Sitting-balance support: No upper extremity supported, Feet supported Sitting balance-Leahy Scale: Fair     Standing balance support: Bilateral upper extremity supported Standing balance-Leahy Scale: Poor                               Pertinent Vitals/Pain  Pain Assessment Pain Assessment: No/denies pain    Home Living Family/patient expects to be discharged to:: Private residence Living Arrangements: Alone Available Help at Discharge: Family;Available PRN/intermittently Type of Home: Mobile home Home Access:  Stairs to enter Entrance Stairs-Rails: Right Entrance Stairs-Number of Steps: 4   Home Layout: One level Home Equipment: Rollator (4 wheels)      Prior Function Prior Level of Function : Independent/Modified Independent                     Hand Dominance        Extremity/Trunk Assessment   Upper Extremity Assessment Upper Extremity Assessment: Generalized weakness    Lower Extremity Assessment Lower Extremity Assessment: Generalized weakness    Cervical / Trunk Assessment Cervical / Trunk Assessment: Normal  Communication   Communication: No difficulties  Cognition Arousal/Alertness: Lethargic Behavior During Therapy: Flat affect Overall Cognitive Status: No family/caregiver present to determine baseline cognitive functioning                                 General Comments: patient was noted to have delayed responses to all questions with "dazed" apperance during session.        General Comments      Exercises     Assessment/Plan    PT Assessment Patient needs continued PT services  PT Problem List Decreased strength;Decreased activity tolerance;Decreased balance;Decreased mobility;Decreased cognition;Decreased knowledge of use of DME;Decreased safety awareness       PT Treatment Interventions DME instruction;Gait training;Stair training;Functional mobility training;Therapeutic activities;Therapeutic exercise;Patient/family education;Balance training    PT Goals (Current goals can be found in the Care Plan section)  Acute Rehab PT Goals Patient Stated Goal: Regain IND PT Goal Formulation: With patient Time For Goal Achievement: 11/22/21 Potential to Achieve Goals: Fair    Frequency Min 3X/week     Co-evaluation               AM-PAC PT "6 Clicks" Mobility  Outcome Measure Help needed turning from your back to your side while in a flat bed without using bedrails?: A Little Help needed moving from lying on your back to  sitting on the side of a flat bed without using bedrails?: A Little Help needed moving to and from a bed to a chair (including a wheelchair)?: A Little Help needed standing up from a chair using your arms (e.g., wheelchair or bedside chair)?: A Little Help needed to walk in hospital room?: Total Help needed climbing 3-5 steps with a railing? : Total 6 Click Score: 14    End of Session Equipment Utilized During Treatment: Gait belt Activity Tolerance: Other (comment) (orthostatic and very unsteady) Patient left: Other (comment) (BSC with RN in room) Nurse Communication: Mobility status PT Visit Diagnosis: Muscle weakness (generalized) (M62.81);Difficulty in walking, not elsewhere classified (R26.2)    Time: 3662-9476 PT Time Calculation (min) (ACUTE ONLY): 25 min   Charges:   PT Evaluation $PT Eval Moderate Complexity: 1 Mod PT Treatments $Therapeutic Activity: 8-22 mins        Debe Coder PT Acute Rehabilitation Services Pager [phone] Office [phone]   Marvin Gill 11/08/2021, 3:31 PM

## 2021-11-08 NOTE — Progress Notes (Signed)
This RN placed a IV team consult to access port due to needing another access and knowing patient is going to start chemo tomorrow. The patient had concerns and wanted to check with Dr. Alen Blew first. This RN called Dr. Alen Blew and spoke with him about it. Dr. Alen Blew said it was okay to access port. This RN relayed Dr. Hazeline Junker message to patient and got consent to place port. IV team nurse was then called to bedside to place port.

## 2021-11-09 ENCOUNTER — Ambulatory Visit: Payer: Medicare HMO

## 2021-11-09 ENCOUNTER — Other Ambulatory Visit: Payer: Medicare HMO

## 2021-11-09 DIAGNOSIS — R0600 Dyspnea, unspecified: Secondary | ICD-10-CM | POA: Diagnosis not present

## 2021-11-09 DIAGNOSIS — D72829 Elevated white blood cell count, unspecified: Secondary | ICD-10-CM | POA: Diagnosis not present

## 2021-11-09 DIAGNOSIS — N401 Enlarged prostate with lower urinary tract symptoms: Secondary | ICD-10-CM | POA: Diagnosis not present

## 2021-11-09 DIAGNOSIS — C670 Malignant neoplasm of trigone of bladder: Secondary | ICD-10-CM | POA: Diagnosis not present

## 2021-11-09 LAB — CBC WITH DIFFERENTIAL/PLATELET
Abs Immature Granulocytes: 0.19 10*3/uL — ABNORMAL HIGH (ref 0.00–0.07)
Basophils Absolute: 0 10*3/uL (ref 0.0–0.1)
Basophils Relative: 0 %
Eosinophils Absolute: 0.1 10*3/uL (ref 0.0–0.5)
Eosinophils Relative: 1 %
HCT: 26 % — ABNORMAL LOW (ref 39.0–52.0)
Hemoglobin: 8.7 g/dL — ABNORMAL LOW (ref 13.0–17.0)
Immature Granulocytes: 1 %
Lymphocytes Relative: 8 %
Lymphs Abs: 1.9 10*3/uL (ref 0.7–4.0)
MCH: 28.5 pg (ref 26.0–34.0)
MCHC: 33.5 g/dL (ref 30.0–36.0)
MCV: 85.2 fL (ref 80.0–100.0)
Monocytes Absolute: 1.2 10*3/uL — ABNORMAL HIGH (ref 0.1–1.0)
Monocytes Relative: 5 %
Neutro Abs: 18.9 10*3/uL — ABNORMAL HIGH (ref 1.7–7.7)
Neutrophils Relative %: 85 %
Platelets: 335 10*3/uL (ref 150–400)
RBC: 3.05 MIL/uL — ABNORMAL LOW (ref 4.22–5.81)
RDW: 14.7 % (ref 11.5–15.5)
WBC: 22.3 10*3/uL — ABNORMAL HIGH (ref 4.0–10.5)
nRBC: 0 % (ref 0.0–0.2)

## 2021-11-09 LAB — COMPREHENSIVE METABOLIC PANEL
ALT: 14 U/L (ref 0–44)
AST: 18 U/L (ref 15–41)
Albumin: 2.8 g/dL — ABNORMAL LOW (ref 3.5–5.0)
Alkaline Phosphatase: 81 U/L (ref 38–126)
Anion gap: 8 (ref 5–15)
BUN: 17 mg/dL (ref 8–23)
CO2: 18 mmol/L — ABNORMAL LOW (ref 22–32)
Calcium: 8.6 mg/dL — ABNORMAL LOW (ref 8.9–10.3)
Chloride: 113 mmol/L — ABNORMAL HIGH (ref 98–111)
Creatinine, Ser: 0.93 mg/dL (ref 0.61–1.24)
GFR, Estimated: >60 mL/min (ref >60)
Glucose, Bld: 107 mg/dL — ABNORMAL HIGH (ref 70–99)
Potassium: 3.6 mmol/L (ref 3.5–5.1)
Sodium: 139 mmol/L (ref 135–145)
Total Bilirubin: 0.6 mg/dL (ref 0.3–1.2)
Total Protein: 6.2 g/dL — ABNORMAL LOW (ref 6.5–8.1)

## 2021-11-09 LAB — CULTURE, BLOOD (ROUTINE X 2)
Culture: NO GROWTH
Culture: NO GROWTH
Special Requests: ADEQUATE

## 2021-11-09 LAB — PHOSPHORUS: Phosphorus: 1.9 mg/dL — ABNORMAL LOW (ref 2.5–4.6)

## 2021-11-09 LAB — MAGNESIUM: Magnesium: 1.9 mg/dL (ref 1.7–2.4)

## 2021-11-09 MED ORDER — POTASSIUM PHOSPHATES 15 MMOLE/5ML IV SOLN
25.0000 mmol | Freq: Once | INTRAVENOUS | Status: AC
  Administered 2021-11-09: 25 mmol via INTRAVENOUS
  Filled 2021-11-09: qty 8.33

## 2021-11-09 NOTE — Progress Notes (Signed)
Patient requested to be updated by charge RN on move status.  The primary RN had kept him informed of transfer orders around 2030 tonight.  Explained situation and emergency needs of there critical patients who needed immediate need that delayed him.    He asked that I call his granddaughter to inform her of room number and new RN.  Patient via wheelchair alert and orient times 4 with RRT and transporter.   CN RR gave report to floor RN Jose.

## 2021-11-09 NOTE — Care Management Important Message (Signed)
Important Message  Patient Details IM Letter given to the Patient. Name: Marvin Gill MRN: [id number] Date of Birth: 11/10/1950   Medicare Important Message Given:  Yes     Kerin Salen 11/09/2021, 10:47 AM

## 2021-11-09 NOTE — Progress Notes (Signed)
PROGRESS NOTE    Marvin Gill  WVP:[id number] DOB: 1951-04-19 DOA: 11/04/2021 PCP: Lavone Orn, MD     Brief Narrative:   71 y.o. 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    He presents with progressive worsening shortness of breath on exertion over the last 2 months but getting progressively worse, dyspnea has progressed to minimal exertion, with just  few steps.  He denies chest pain.  He also report dizziness on standing and on ambulation.  He has fallen multiple times.  He passed out also multiple times per daughter, last episode on Sunday, he fell yesterday.    He also report 50 pounds weight loss.  Reports poor oral intake.  He has abdominal pain since after the surgery, not getting worse.  He has not felt the same since after the surgery.   He was off Eliquis for 2 weeks. He started to take eliquis  yesterday as recommended by his urology.  Eliquis was on hold due to hematuria.  He had an episode of bloody urine in the ED.   Subjective: 6/12 Tmax overnight 38.1 C.  A/O x4, extremely weak.  Patient states he will not be able to withstand chemotherapy currently and requests rehab   Assessment & Plan: Covid vaccination;   Principal Problem:   Dyspnea Active Problems:   Benign localized prostatic hyperplasia with lower urinary tract symptoms (LUTS)   PAF (paroxysmal atrial fibrillation) (HCC)   Bladder cancer (HCC)   COPD with emphysema (HCC)   GERD (gastroesophageal reflux disease)   Hypothyroidism   Dyspnea on exertion   Leukocytosis   Protein-calorie malnutrition, severe   Ventricular tachycardia (HCC)  Dyspnea on exertion:  -He presents complaining of dyspnea on exertion, progressively  getting worse.  -Oxygen sat drop from 99 to 70 during orthostatic vitals.  -He has been off Eliquis for 2 weeks. Just resume yesterday.  -Troponin negative times 2.  -Hb at 13.  -6/7 CT chest PE protocol negative for PE.  See results below.  NOTE will need repeat CT scan in 3 months -6/8 Echocardiogram: Diastolic CHF see results below  -6/12 ambulatory SPO2 pending     Syncope, frequent fall. Suspect related to orthostatic Hypotension.  -Most likely multifactorial dehydration, A-fib RVR?  Orthostatic hypotension?  -6/7 TSH WNL -6/8 AM cortisol WNL -Orthostatic vitals every shift  Acute Diastolic CHF - 6/9 see echocardiogram below -Strict in and out +5.1 L (6/12) - Daily weight Filed Weights   11/08/21 0304 11/09/21 0033 11/09/21 0219  Weight: 81.1 kg 80.8 kg 80.8 kg   -6/10 Amiodarone loading dose plus drip -6/11 patient remains in A-fib RVR, borderline BP.  Asymptomatic. - 6/11 Digoxin IV 0.25 mg x 1 -6/11 titrate Amiodarone drip off as tolerated  PAF;  -Resume Eliquis.  -6/12 currently NSR -See diastolic CHF  V. tach - 5/46 patient had V. tach 170-180 stable (negative CP, negative SOB, cognition intact.) - See acute diastolic CHF  Leukocytosis, left shift.  Lab Results  Component Value Date   WBC 22.3 (H) 11/09/2021   WBC 25.9 (H) 11/08/2021   WBC 27.2 (H) 11/07/2021   WBC 27.8 (H) 11/06/2021   WBC 28.4 (H) 11/06/2021  -6/7 cultures NGTD -6/9 PCXR negative -6/9 UA pending, blood culture  urine culture; NGTD as of 6/10 -6/9 check pro-calcitonin.= 0.39 -6/9 lactic acid =0.8 -Continue with IV antibiotics: IV ceftriaxone.  -6/9 plan to rule out infection.  -6/10 most likely secondary to his high-grade cancer however will continue antibiotic until cultures negative  5-T- 3 High grade  urothelial carcinoma of left distal ureter:  -Dr. Zola Button oncology aware of patient's admission per admission note. -Status post cystoscopy with left ureteral stent placement and  TUR of the left  ureteral orifice, robotic assisted laparoscopic left nephro ureterectomy 09/01/2021 -He has chronic abdominal pain after Surgery.  -If leukocytosis persist, pro-calcitonin elevated and no other source of infection, could consider CT abdomen pelvis.  -6/9 obtain CT abdomen pelvis in A.m. if urine and blood cultures negative -6/12 discussed case with pharmacy regarding chemotherapy and research in EMR found following recommendations from oncology:  1.  Treatment will be delayed given his recent hospitalization and illness.  We will address this as an outpatient.  2.  No objections to discharge once felt stable by the primary team.  He has follow-up in June 19.    Hypothyroidism;  -Synthroid 50 mcg daily.  -6/7 TSH = 2.4 WNL    FTT;  -Daughter is concern about high dose gabapentin. Plan to reduce gabapentin to 600 mg.    Sore throat:  -Strept A negative.  -6/8 resolved   GERD;  PPI BID>    Severe protein calorie malnutrition - 6/9 continue feeding supplements per nutrition recommendation  Hypokalemia - Potassium goal> 4 - 6/12 K-Phos 25 mmol  Hypophosphatemia - Phosphorus goal> 2.5 - See hypokalemia  Hypoalbuminemia - 6/11 albumin 50 g x 1  Goals of care - 6/10 PT/OT consult patient with High grade  urothelial carcinoma of left distal ureter; extremely weak evaluate for CIR vs SNF -6/12 PT second request for consult:Patient with high-grade cancer extremely weak lives at home alone, will require CIR vs SNF in order to increase stamina enough to stand administration of chemotherapy.  Please evaluate    Mobility Assessment (last 72 hours)     Mobility Assessment     Row Name 11/09/21 0030 11/08/21 1500 11/07/21 1354 11/07/21 0928 11/06/21 2000   Does patient have an order for bedrest or is patient medically unstable No - Continue assessment -- -- No - Continue assessment No - Continue assessment   What is the highest level of mobility based on the progressive  mobility assessment? Level 4 (Walks with assist in room) - Balance while marching in place and cannot step forward and back - Complete Level 3 (Stands with assist) - Balance while standing  and cannot march in place Level 2 (Chairfast) - Balance while sitting on edge of bed and cannot stand Level 5 (Walks with assist in room/hall) - Balance while stepping forward/back and can walk in room with assist - Complete Level 5 (Walks with assist in room/hall) - Balance while stepping forward/back and can walk in room with assist - Complete              Interdisciplinary Goals of Care Family Meeting   Date carried out: 11/09/2021  Location of the meeting:   Member's involved:   Durable Power of Tour manager:     Discussion: We discussed goals of care for [name] .   Code status:   Disposition:   Time spent for the meeting:     Terianne Thaker, Geraldo Docker, MD  11/09/2021, 1:24 PM  DVT prophylaxis: Eliquis Code Status: Full Family Communication:  Status is: Inpatient    Dispo: The patient is from: Home              Anticipated d/c is to: Home              Anticipated d/c date is: 3 days              Patient currently is not medically stable to d/c.      Consultants:    Procedures/Significant Events:  6/7 CTA chest PE protocol No evidence for pulmonary embolism. 2. New focal parenchymal opacity in the medial left lung apex measuring 1.6 x 0.9 x 1.8 cm. Findings are indeterminate and may represent infectious/inflammatory process Consider one of the following in 3 months for both low-risk and high-risk individuals: (a) repeat chest CT, (b) follow-up PET-CT, or (c) tissue sampling. 6/8 echocardiogram Left Ventricle: LVEF= 55 to 60%. The left ventricle has no WMA Left ventricular diastolic parameters are consistent with Grade I diastolic dysfunction (impaired relaxation).  6/9 PCXR: No acute findings   I have personally reviewed  and interpreted all radiology studies and my findings are as above.  VENTILATOR SETTINGS: Room air 6/12 SPO2 98%   Cultures 6/7 group A strep by PCR negative 6/7 blood pending 6/7 blood pending 6/7 urine positive insignificant growth 6/8 SARS coronavirus negative 6/9 blood left AC NGTD  6/9 blood RIGHT AC NGTD 6/9 urine pending    Antimicrobials: Anti-infectives (From admission, onward)    Start     Ordered Stop   11/04/21 2200  cefTRIAXone (ROCEPHIN) 2 g in sodium chloride 0.9 % 100 mL IVPB        11/04/21 1758     11/04/21 1545  piperacillin-tazobactam (ZOSYN) IVPB 3.375 g        11/04/21 1535 11/04/21 1642   11/04/21 1545  vancomycin (VANCOREADY) IVPB 1750 mg/350 mL        11/04/21 1544 11/04/21 1847         Devices    LINES / TUBES:      Continuous Infusions:  sodium chloride 75 mL/hr at 11/09/21 0903   amiodarone 30 mg/hr (11/09/21 0430)   cefTRIAXone (ROCEPHIN)  IV Stopped (11/08/21 2210)   potassium PHOSPHATE IVPB (in mmol)       Objective: Vitals:   11/09/21 0219 11/09/21 0439 11/09/21 0625 11/09/21 0907  BP:  (!) 118/58  117/70  Pulse:  78  71  Resp:  20  20  Temp:  (!) 100.6 F (38.1 C) 100 F (37.8 C) 98.7 F (37.1 C)  TempSrc:   Oral Oral  SpO2:  99%  97%  Weight: 80.8 kg     Height:        Intake/Output Summary (Last 24 hours) at 11/09/2021 1324 Last data filed at 11/09/2021 0433 Gross per 24 hour  Intake 1592.17 ml  Output 920 ml  Net 672.17 ml    Filed Weights   11/08/21 0304 11/09/21 0033 11/09/21 0219  Weight: 81.1 kg 80.8 kg 80.8 kg    Examination:  General: A/O x4, No acute respiratory distress:  Eyes: negative scleral hemorrhage, negative anisocoria, negative icterus ENT: Negative Runny nose, negative gingival bleeding, Neck:  Negative scars, masses, torticollis, lymphadenopathy, JVD Lungs: Clear to auscultation bilaterally without wheezes or crackles Cardiovascular: Regular rhythm and rate without murmur gallop  or rub normal S1 and S2 Abdomen: Mild abdominal pain with palpation, nondistended, positive soft, bowel sounds, no rebound, no ascites, no  appreciable mass Extremities: No significant cyanosis, clubbing, or edema bilateral lower extremities Skin: Negative rashes, lesions, ulcers Psychiatric:  Negative depression, negative anxiety, negative fatigue, negative mania  Central nervous system:  Cranial nerves II through XII intact, tongue/uvula midline, all extremities muscle strength 5/5, sensation intact throughout, negative dysarthria, negative expressive aphasia, negative receptive aphasia.  .     Data Reviewed: Care during the described time interval was provided by me .  I have reviewed this patient's available data, including medical history, events of note, physical examination, and all test results as part of my evaluation.  CBC: Recent Labs  Lab 11/06/21 0547 11/06/21 1612 11/07/21 0705 11/08/21 0243 11/09/21 0348  WBC 28.4* 27.8* 27.2* 25.9* 22.3*  NEUTROABS 23.7* 24.5* 22.5* 21.6* 18.9*  HGB 10.7* 10.7* 9.5* 9.6* 8.7*  HCT 32.1* 32.5* 27.8* 28.0* 26.0*  MCV 87.0 87.8 85.5 85.1 85.2  PLT 349 342 325 317 812    Basic Metabolic Panel: Recent Labs  Lab 11/06/21 1612 11/07/21 0705 11/07/21 1927 11/08/21 0243 11/09/21 0348  NA 134* 137 134* 137 139  K 3.8 3.5 3.6 3.2* 3.6  CL 105 109 108 110 113*  CO2 18* 19* 16* 16* 18*  GLUCOSE 100* 101* 98 107* 107*  BUN '19 19 17 17 17  '$ CREATININE 1.03 1.17 1.06 1.04 0.93  CALCIUM 8.8* 8.5* 8.4* 8.4* 8.6*  MG 1.7 1.7 1.7 1.8 1.9  PHOS 2.6 3.2 2.7 2.7 1.9*    GFR: Estimated Creatinine Clearance: 82.1 mL/min (by C-G formula based on SCr of 0.93 mg/dL). Liver Function Tests: Recent Labs  Lab 11/06/21 0547 11/06/21 1612 11/07/21 0705 11/08/21 0243 11/09/21 0348  AST 12* 13* 13* 15 18  ALT '10 11 10 11 14  '$ ALKPHOS 91 96 88 89 81  BILITOT 0.7 0.7 1.0 0.7 0.6  PROT 6.7 6.5 6.1* 6.0* 6.2*  ALBUMIN 2.9* 2.8* 2.5* 2.4* 2.8*     No results for input(s): "LIPASE", "AMYLASE" in the last 168 hours. No results for input(s): "AMMONIA" in the last 168 hours. Coagulation Profile: No results for input(s): "INR", "PROTIME" in the last 168 hours. Cardiac Enzymes: No results for input(s): "CKTOTAL", "CKMB", "CKMBINDEX", "TROPONINI" in the last 168 hours. BNP (last 3 results) No results for input(s): "PROBNP" in the last 8760 hours. HbA1C: No results for input(s): "HGBA1C" in the last 72 hours. CBG: No results for input(s): "GLUCAP" in the last 168 hours. Lipid Profile: No results for input(s): "CHOL", "HDL", "LDLCALC", "TRIG", "CHOLHDL", "LDLDIRECT" in the last 72 hours. Thyroid Function Tests: Recent Labs    11/08/21 0828  TSH 2.248    Anemia Panel: No results for input(s): "VITAMINB12", "FOLATE", "FERRITIN", "TIBC", "IRON", "RETICCTPCT" in the last 72 hours. Sepsis Labs: Recent Labs  Lab 11/04/21 1804 11/04/21 1809 11/04/21 1928 11/05/21 0652 11/06/21 0547 11/06/21 1612 11/06/21 1909 11/06/21 2254  PROCALCITON 0.16  --   --  0.30 0.39  --   --   --   LATICACIDVEN  --    < > 0.9  --   --  1.1 1.2 0.8   < > = values in this interval not displayed.     Recent Results (from the past 240 hour(s))  Group A Strep by PCR     Status: None   Collection Time: 11/04/21  3:07 PM   Specimen: Throat; Sterile Swab  Result Value Ref Range Status   Group A Strep by PCR NOT DETECTED NOT DETECTED Final    Comment: Performed at [facility], [place] [address], [place], [place] [address]  Blood culture (routine x 2)     Status: None   Collection Time: 11/04/21  3:42 PM   Specimen: BLOOD RIGHT HAND  Result Value Ref Range Status   Specimen Description   Final    BLOOD RIGHT HAND BLOOD Performed at [facility] [address] [place], [place] [address]    Special Requests   Final    Blood Culture results may not be optimal due to an inadequate volume of blood received in  culture bottles BOTTLES DRAWN AEROBIC AND ANAEROBIC Performed at [facility], [place] [address], [place], [place] [address]    Culture   Final    NO GROWTH 5 DAYS Performed at [facility], [place] [address], [place], [place] [address]    Report Status 11/09/2021 FINAL  Final  Blood culture (routine x 2)     Status: None   Collection Time: 11/04/21  4:00 PM   Specimen: BLOOD RIGHT ARM  Result Value Ref Range Status   Specimen Description   Final    BLOOD RIGHT ARM BLOOD Performed at [facility] [address] [place], [place] [address]    Special Requests   Final    Blood Culture adequate volume BOTTLES DRAWN AEROBIC AND ANAEROBIC Performed at [facility] [address], [place], [place] [address]    Culture   Final    NO GROWTH 5 DAYS Performed at [facility], [place] [address] [place], [place] [address]    Report Status 11/09/2021 FINAL  Final  Urine Culture     Status: Abnormal   Collection Time: 11/04/21 10:01 PM   Specimen: Urine, Clean Catch  Result Value Ref Range Status   Specimen Description   Final    URINE, CLEAN CATCH Performed at [facility], [place] [address], [place], [place] [address]    Special Requests   Final    NONE Performed at [facility], [place] [address], [place], [place] [address]    Culture (A)  Final    <10,000 COLONIES/mL INSIGNIFICANT GROWTH Performed at [facility] [address], [place], [place] [address]    Report Status 11/06/2021 FINAL  Final  SARS Coronavirus 2 by RT PCR (hospital order, performed in [facility] hospital lab) *cepheid single result test* Anterior Nasal Swab     Status: None   Collection Time: 11/05/21  8:37 AM   Specimen: Anterior Nasal Swab  Result Value Ref Range Status   SARS Coronavirus 2 by RT PCR NEGATIVE NEGATIVE Final    Comment: (NOTE) SARS-CoV-2 target nucleic acids are NOT DETECTED.  The  SARS-CoV-2 RNA is generally detectable in upper and lower respiratory specimens during the acute phase of infection. The lowest concentration of SARS-CoV-2 viral copies this assay can detect is 250 copies / mL. A negative result does not preclude SARS-CoV-2 infection and should not be used as the sole basis for treatment or other patient management decisions.  A negative result may occur with improper specimen collection / handling, submission of specimen other than nasopharyngeal swab, presence of viral mutation(s) within the areas targeted by this assay, and inadequate number of viral copies (<250 copies / mL). A negative result must be combined with clinical observations, patient history, and epidemiological information.  Fact Sheet for Patients:   https://www.patel.info/  Fact Sheet for Healthcare Providers: https://hall.com/  This test is not yet  approved or  cleared by the [place] and has been authorized for detection and/or diagnosis of SARS-CoV-2 by FDA under an Emergency Use Authorization (EUA).  This EUA will remain in effect (meaning this test can be used) for the duration of the COVID-19 declaration under Section 564(b)(1) of the Act, 21 U.S.C. section 360bbb-3(b)(1), unless the authorization is terminated or revoked sooner.  Performed at [facility], [place] [address], [place], [place] [address]   Culture, blood (Routine X 2) w Reflex to ID Panel     Status: None (Preliminary result)   Collection Time: 11/06/21  4:12 PM   Specimen: BLOOD  Result Value Ref Range Status   Specimen Description   Final    BLOOD RIGHT ANTECUBITAL Performed at [facility] [address], [place], [place] [address]    Special Requests   Final    BOTTLES DRAWN AEROBIC ONLY Blood Culture adequate volume Performed at [facility] [address], [place], [place] [address]     Culture   Final    NO GROWTH 3 DAYS Performed at [facility], [place] [address], [place], [place] [address]    Report Status PENDING  Incomplete  Culture, blood (Routine X 2) w Reflex to ID Panel     Status: None (Preliminary result)   Collection Time: 11/06/21  4:12 PM   Specimen: BLOOD  Result Value Ref Range Status   Specimen Description   Final    BLOOD LEFT ANTECUBITAL Performed at [facility] [address], [place], [place] [address]    Special Requests   Final    BOTTLES DRAWN AEROBIC ONLY Blood Culture adequate volume Performed at [facility] [address], [place], [place] [address]    Culture   Final    NO GROWTH 3 DAYS Performed at [facility], [place] [address] [place], [place] [address]    Report Status PENDING  Incomplete  Urine Culture     Status: None   Collection Time: 11/06/21  6:28 PM   Specimen: Urine, Clean Catch  Result Value Ref Range Status   Specimen Description   Final    URINE, CLEAN CATCH Performed at [facility], [place] [address], [place], [place] [address]    Special Requests   Final    NONE Performed at [facility], [place] [address] [place], [place] [address]    Culture   Final    NO GROWTH Performed at [facility], [place] [address] [place], [place] [address]    Report Status 11/08/2021 FINAL  Final         Radiology Studies: No results found.      Scheduled Meds:  ALPRAZolam  1 mg Oral QHS   And   ALPRAZolam  0.5 mg Oral Daily   apixaban  5 mg Oral BID   Chlorhexidine Gluconate Cloth  6 each Topical Daily   darifenacin  7.5 mg Oral Daily   docusate sodium  100 mg Oral BID   feeding supplement  237 mL Oral QID   gabapentin  600 mg Oral TID   levothyroxine  50 mcg Oral QAC breakfast   multivitamin with minerals  1 tablet Oral Daily   pantoprazole  40 mg Oral BID   pravastatin  40 mg Oral Daily   sodium chloride flush  10-40 mL  Intracatheter Q12H   tamsulosin  0.4 mg Oral BID  Continuous Infusions:  sodium chloride 75 mL/hr at 11/09/21 0903   amiodarone 30 mg/hr (11/09/21 0430)   cefTRIAXone (ROCEPHIN)  IV Stopped (11/08/21 2210)   potassium PHOSPHATE IVPB (in mmol)       LOS: 4 days    Time spent:40 min    Arlinda Barcelona, Geraldo Docker, MD [facility]   If 7PM-7AM, please contact night-coverage 11/09/2021, 1:24 PM

## 2021-11-10 DIAGNOSIS — N401 Enlarged prostate with lower urinary tract symptoms: Secondary | ICD-10-CM | POA: Diagnosis not present

## 2021-11-10 DIAGNOSIS — D72829 Elevated white blood cell count, unspecified: Secondary | ICD-10-CM | POA: Diagnosis not present

## 2021-11-10 DIAGNOSIS — C670 Malignant neoplasm of trigone of bladder: Secondary | ICD-10-CM | POA: Diagnosis not present

## 2021-11-10 DIAGNOSIS — R0600 Dyspnea, unspecified: Secondary | ICD-10-CM | POA: Diagnosis not present

## 2021-11-10 LAB — CBC WITH DIFFERENTIAL/PLATELET
Abs Immature Granulocytes: 0.21 10*3/uL — ABNORMAL HIGH (ref 0.00–0.07)
Basophils Absolute: 0.1 10*3/uL (ref 0.0–0.1)
Basophils Relative: 0 %
Eosinophils Absolute: 0.2 10*3/uL (ref 0.0–0.5)
Eosinophils Relative: 1 %
HCT: 25.6 % — ABNORMAL LOW (ref 39.0–52.0)
Hemoglobin: 8.5 g/dL — ABNORMAL LOW (ref 13.0–17.0)
Immature Granulocytes: 1 %
Lymphocytes Relative: 7 %
Lymphs Abs: 1.5 10*3/uL (ref 0.7–4.0)
MCH: 28.2 pg (ref 26.0–34.0)
MCHC: 33.2 g/dL (ref 30.0–36.0)
MCV: 85 fL (ref 80.0–100.0)
Monocytes Absolute: 1.1 10*3/uL — ABNORMAL HIGH (ref 0.1–1.0)
Monocytes Relative: 5 %
Neutro Abs: 18.2 10*3/uL — ABNORMAL HIGH (ref 1.7–7.7)
Neutrophils Relative %: 86 %
Platelets: 351 10*3/uL (ref 150–400)
RBC: 3.01 MIL/uL — ABNORMAL LOW (ref 4.22–5.81)
RDW: 14.6 % (ref 11.5–15.5)
WBC: 21.2 10*3/uL — ABNORMAL HIGH (ref 4.0–10.5)
nRBC: 0 % (ref 0.0–0.2)

## 2021-11-10 LAB — MAGNESIUM: Magnesium: 1.7 mg/dL (ref 1.7–2.4)

## 2021-11-10 LAB — COMPREHENSIVE METABOLIC PANEL
ALT: 14 U/L (ref 0–44)
AST: 15 U/L (ref 15–41)
Albumin: 2.5 g/dL — ABNORMAL LOW (ref 3.5–5.0)
Alkaline Phosphatase: 76 U/L (ref 38–126)
Anion gap: 6 (ref 5–15)
BUN: 10 mg/dL (ref 8–23)
CO2: 19 mmol/L — ABNORMAL LOW (ref 22–32)
Calcium: 8.2 mg/dL — ABNORMAL LOW (ref 8.9–10.3)
Chloride: 112 mmol/L — ABNORMAL HIGH (ref 98–111)
Creatinine, Ser: 0.79 mg/dL (ref 0.61–1.24)
GFR, Estimated: >60 mL/min (ref >60)
Glucose, Bld: 107 mg/dL — ABNORMAL HIGH (ref 70–99)
Potassium: 3.3 mmol/L — ABNORMAL LOW (ref 3.5–5.1)
Sodium: 137 mmol/L (ref 135–145)
Total Bilirubin: 0.7 mg/dL (ref 0.3–1.2)
Total Protein: 5.6 g/dL — ABNORMAL LOW (ref 6.5–8.1)

## 2021-11-10 LAB — PHOSPHORUS: Phosphorus: 2.7 mg/dL (ref 2.5–4.6)

## 2021-11-10 MED ORDER — MAGNESIUM SULFATE 2 GM/50ML IV SOLN
2.0000 g | Freq: Once | INTRAVENOUS | Status: AC
  Administered 2021-11-10: 2 g via INTRAVENOUS
  Filled 2021-11-10: qty 50

## 2021-11-10 MED ORDER — AMIODARONE HCL 100 MG PO TABS
100.0000 mg | ORAL_TABLET | Freq: Two times a day (BID) | ORAL | Status: DC
  Administered 2021-11-10 – 2021-11-13 (×7): 100 mg via ORAL
  Filled 2021-11-10 (×7): qty 1

## 2021-11-10 MED ORDER — POTASSIUM CHLORIDE 10 MEQ/100ML IV SOLN
10.0000 meq | INTRAVENOUS | Status: AC
  Administered 2021-11-10 (×5): 10 meq via INTRAVENOUS
  Filled 2021-11-10 (×5): qty 100

## 2021-11-10 NOTE — Progress Notes (Addendum)
Physical Therapy Treatment Patient Details Name: Marvin Gill MRN: [id number] DOB: 04-18-1951 Today's Date: 11/10/2021   History of Present Illness patient is a 71 year old male who presented to the hosptial adter fall at home with syncopal episodes and increased shortness of breath. patient was admitted with dyspnea on exertion, syncope, orthostatic hypotension and acute diastolic CHF.   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  Pt was able to progress to ambulating in hallway short distance today however does still report mild dizziness.  Pt from home alone and would benefit from more assist at home upon d/c and HHPT.  If assist not available, would recommend SNF.    Recommendations for follow up therapy are one component of a multi-disciplinary discharge planning process, led by the attending physician.  Recommendations may be updated based on patient status, additional functional criteria and insurance authorization.  Follow Up Recommendations  Home health PT     Assistance Recommended at Discharge Frequent or constant Supervision/Assistance  Patient can return home with the following A little help with bathing/dressing/bathroom;Assist for transportation;Help with stairs or ramp for entrance;Assistance with cooking/housework;A little help with walking and/or transfers   Equipment Recommendations  Rolling walker (2 wheels)    Recommendations for Other Services       Precautions / Restrictions Precautions Precautions: Fall Precaution Comments: orthostatic      Mobility  Bed Mobility Overal bed mobility: Needs Assistance Bed Mobility: Supine to Sit     Supine to sit: Supervision, HOB elevated     General bed mobility comments: increased time    Transfers Overall transfer level: Needs assistance Equipment used: Rolling walker (2 wheels) Transfers: Sit to/from Stand Sit to Stand: Min assist           General transfer comment: light assist to steady upon rise, cues for hand placement    Ambulation/Gait Ambulation/Gait assistance: Min assist, Min guard Gait Distance (Feet): 80 Feet Assistive device: Rolling walker (2 wheels) Gait Pattern/deviations: Step-through pattern, Decreased stride length       General Gait Details: pt reports mild "wooziness" which did not become worse, increased time however appears steady with RW, distance to tolerance   Stairs             Wheelchair Mobility    Modified Rankin (Stroke Patients Only)       Balance           Standing balance support: During functional activity, Bilateral upper extremity supported, Reliant on assistive device for balance Standing balance-Leahy Scale: Poor                              Cognition Arousal/Alertness: Awake/alert Behavior During Therapy: Flat affect Overall Cognitive Status: No family/caregiver present to determine baseline cognitive functioning                                 General Comments: increased time however appropriate responses        Exercises  General Comments        Pertinent Vitals/Pain Pain Assessment Pain Assessment: No/denies pain    Home Living                          Prior Function            PT Goals (current goals can now be found in the care plan section) Progress towards PT goals: Progressing toward goals    Frequency    Min 3X/week      PT Plan Discharge plan needs to be updated    Co-evaluation              AM-PAC PT "6 Clicks"  Mobility   Outcome Measure  Help needed turning from your back to your side while in a flat bed without using bedrails?: A Little Help needed moving from lying on your back to sitting on the side of a flat bed without using bedrails?: A Little Help needed moving to and from a bed to a chair (including a wheelchair)?: A Little Help needed standing up from a chair using your arms (e.g., wheelchair or bedside chair)?: A Little Help needed to walk in hospital room?: A Little Help needed climbing 3-5 steps with a railing? : A Lot 6 Click Score: 17    End of Session Equipment Utilized During Treatment: Gait belt Activity Tolerance: Patient tolerated treatment well Patient left: in chair;with call bell/phone within reach;with chair alarm set   PT Visit Diagnosis: Muscle weakness (generalized) (M62.81);Difficulty in walking, not elsewhere classified (R26.2)     Time: 0349-1791 PT Time Calculation (min) (ACUTE ONLY): 19 min  Charges:  $Gait Training: 8-22 mins                    Jannette Spanner PT, DPT Acute Rehabilitation Services Pager: [phone] Office: [place] 11/10/2021, 11:06 AM

## 2021-11-10 NOTE — Progress Notes (Signed)
SATURATION QUALIFICATIONS: (This note is used to comply with regulatory documentation for home oxygen)  Patient Saturations on Room Air at Rest = 100%  Patient Saturations on Room Air while Ambulating = 85-88%  Please briefly explain why patient needs home oxygen: Patient ambulated a distance of 137f on room air while maintaining O2 sats between 85-88%. Patient denied feeling short of breath or dizzy while ambulating. Once returned to bed, patient sat on side of bed and sat in a tripod position. O2 sats returned to 100%.

## 2021-11-10 NOTE — Progress Notes (Signed)
PROGRESS NOTE    Marvin Gill  YOV:[id number] DOB: 27-May-1951 DOA: 11/04/2021 PCP: Lavone Orn, MD     Brief Narrative:   71 y.o. 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    He presents with progressive worsening shortness of breath on exertion over the last 2 months but getting progressively worse, dyspnea has progressed to minimal exertion, with just  few steps.  He denies chest pain.  He also report dizziness on standing and on ambulation.  He has fallen multiple times.  He passed out also multiple times per daughter, last episode on Sunday, he fell yesterday.    He also report 50 pounds weight loss.  Reports poor oral intake.  He has abdominal pain since after the surgery, not getting worse.  He has not felt the same since after the surgery.   He was off Eliquis for 2 weeks. He started to take eliquis  yesterday as recommended by his urology.  Eliquis was on hold due to hematuria.  He had an episode of bloody urine in the ED.   Subjective: 6/13 Tmax overnight 38.1 C.  A/O x4, extremely weak.   Assessment & Plan: Covid vaccination;   Principal Problem:   Dyspnea Active Problems:   Benign localized prostatic hyperplasia with lower urinary tract symptoms (LUTS)   PAF (paroxysmal atrial fibrillation) (HCC)   Bladder cancer (HCC)   COPD with emphysema (HCC)   GERD (gastroesophageal reflux disease)   Hypothyroidism   Dyspnea on exertion   Leukocytosis   Protein-calorie malnutrition, severe   Ventricular tachycardia (HCC)  Dyspnea on exertion:  -He presents complaining of dyspnea on exertion, progressively getting worse.  -Oxygen sat drop from 99 to 70 during orthostatic vitals.  -He has been off  Eliquis for 2 weeks. Just resume yesterday.  -Troponin negative times 2.  -Hb at 13.  -6/7 CT chest PE protocol negative for PE.  See results below.  NOTE will need repeat CT scan in 3 months -6/8 Echocardiogram: Diastolic CHF see results below  -6/12 ambulatory SPO2 pending     Syncope, frequent fall. Suspect related to orthostatic Hypotension.  -Most likely multifactorial dehydration, A-fib RVR?  Orthostatic hypotension?  -6/7 TSH WNL -6/8 AM cortisol WNL -Orthostatic vitals every shift  Acute Diastolic CHF - 6/9 see echocardiogram below -Strict in and out +6.0 L (6/13) - Daily weight Filed Weights   11/08/21 0304 11/09/21 0033 11/09/21 0219  Weight: 81.1 kg 80.8 kg 80.8 kg   -6/10 Amiodarone loading dose plus drip -6/11 patient remains in A-fib RVR, borderline BP.  Asymptomatic. - 6/11 Digoxin IV 0.25 mg x 1 -6/11 titrate Amiodarone drip off as tolerated -6/13 DC Amiodarone drip - 6/13 Amiodarone 100 mg BID  PAF;  -Resume Eliquis.  -6/12 currently NSR -See diastolic CHF  V. tach - 7/41 patient had V. tach 170-180 stable (negative CP, negative SOB, cognition intact.) - See acute diastolic CHF  Leukocytosis, left shift.  Lab Results  Component Value Date   WBC 21.2 (H) 11/10/2021   WBC 22.3 (H) 11/09/2021   WBC 25.9 (H) 11/08/2021   WBC 27.2 (H) 11/07/2021   WBC 27.8 (H) 11/06/2021  -6/7 cultures NGTD -6/9 PCXR negative -6/9 UA pending, blood culture urine culture; NGTD as of  6/10 -6/9 check pro-calcitonin.= 0.39 -6/9 lactic acid =0.8 -Continue with IV antibiotics: IV ceftriaxone.  -6/9 plan to rule out infection.  -6/10 most likely secondary to his high-grade cancer however will continue antibiotic until cultures negative -6/13 cultures have been negative, even though patient continues to spike fever.  Negative bands.  Has completed 7-day course antibiotics.  Most likely not an infection, will discontinue antibiotics and monitor closely  5-T- 3 High grade   urothelial carcinoma of left distal ureter:  -Dr. Zola Button oncology aware of patient's admission per admission note. -Status post cystoscopy with left ureteral stent placement and TUR of the left  ureteral orifice, robotic assisted laparoscopic left nephro ureterectomy 09/01/2021 -He has chronic abdominal pain after Surgery.  -If leukocytosis persist, pro-calcitonin elevated and no other source of infection, could consider CT abdomen pelvis.  -6/9 obtain CT abdomen pelvis in A.m. if urine and blood cultures negative -6/12 discussed case with pharmacy regarding chemotherapy and research in EMR found following recommendations from oncology:  1.  Treatment will be delayed given his recent hospitalization and illness.  We will address this as an outpatient.  2.  No objections to discharge once felt stable by the primary team.  He has follow-up on June 19.    Hypothyroidism;  -Synthroid 50 mcg daily.  -6/7 TSH = 2.4 WNL    FTT;  -Daughter is concern about high dose gabapentin. Plan to reduce gabapentin to 600 mg.    Sore throat:  -Strept A negative.  -6/8 resolved   GERD;  PPI BID>    Severe protein calorie malnutrition - 6/9 continue feeding supplements per nutrition recommendation  Hypokalemia - Potassium goal> 4 - 6/13 Potassium IV 50 mEq  Hypophosphatemia - Phosphorus goal> 2.5  Hypomagnesmia - Magnesium goal> 2 - 6/13 Magnesium IV 2 g  Hypoalbuminemia - 6/11 albumin 50 g x 1  Goals of care - 6/10 PT/OT consult patient with High grade  urothelial carcinoma of left distal ureter; extremely weak evaluate for CIR vs SNF -6/12 PT second request for consult:Patient with high-grade cancer extremely weak lives at home alone, will require CIR vs SNF in order to increase stamina enough to stand administration of chemotherapy.  Please evaluate -6/13 consult TOC: Patient lives alone, given his debilitated state UNSAFE for patient to be discharged home.  Begin paperwork for SNF  placement.  Patient in agreement    Mobility Assessment (last 72 hours)     Mobility Assessment     Row Name 11/09/21 0030 11/08/21 1500 11/07/21 1354       Does patient have an order for bedrest or is patient medically unstable No - Continue assessment -- --     What is the highest level of mobility based on the progressive mobility assessment? Level 4 (Walks with assist in room) - Balance while marching in place and cannot step forward and back - Complete Level 3 (Stands with assist) - Balance while standing  and cannot march in place Level 2 (Chairfast) - Balance while sitting on edge of bed and cannot stand                Interdisciplinary Goals of Care Family Meeting   Date carried out: 11/10/2021  Location of the meeting:   Member's involved:   Durable Power of Tour manager:     Discussion: We discussed goals of care for Marvin Gill .   Code status:   Disposition:   Time spent for  the meeting:     Allie Bossier, MD  11/10/2021, 11:02 AM         DVT prophylaxis: Eliquis Code Status: Full Family Communication:  Status is: Inpatient    Dispo: The patient is from: Home              Anticipated d/c is to: Home              Anticipated d/c date is: 3 days              Patient currently is not medically stable to d/c.      Consultants:    Procedures/Significant Events:  6/7 CTA chest PE protocol No evidence for pulmonary embolism. 2. New focal parenchymal opacity in the medial left lung apex measuring 1.6 x 0.9 x 1.8 cm. Findings are indeterminate and may represent infectious/inflammatory process Consider one of the following in 3 months for both low-risk and high-risk individuals: (a) repeat chest CT, (b) follow-up PET-CT, or (c) tissue sampling. 6/8 echocardiogram Left Ventricle: LVEF= 55 to 60%. The left ventricle has no WMA Left ventricular diastolic parameters are consistent with Grade I diastolic  dysfunction (impaired relaxation).  6/9 PCXR: No acute findings   I have personally reviewed and interpreted all radiology studies and my findings are as above.  VENTILATOR SETTINGS: Room air 6/13 SPO2 98%   Cultures 6/7 group A strep by PCR negative 6/7 blood pending 6/7 blood pending 6/7 urine positive insignificant growth 6/8 SARS coronavirus negative 6/9 blood left AC NGTD  6/9 blood RIGHT AC NGTD 6/9 urine negative     Antimicrobials: Anti-infectives (From admission, onward)    Start     Ordered Stop   11/04/21 2200  cefTRIAXone (ROCEPHIN) 2 g in sodium chloride 0.9 % 100 mL IVPB  Status:  Discontinued        11/04/21 1758 11/10/21 1108   11/04/21 1545  piperacillin-tazobactam (ZOSYN) IVPB 3.375 g        11/04/21 1535 11/04/21 1642   11/04/21 1545  vancomycin (VANCOREADY) IVPB 1750 mg/350 mL        11/04/21 1544 11/04/21 1847        Devices    LINES / TUBES:      Continuous Infusions:  sodium chloride 75 mL/hr at 11/09/21 2142   amiodarone 30 mg/hr (11/10/21 0418)   cefTRIAXone (ROCEPHIN)  IV 2 g (11/09/21 2244)     Objective: Vitals:   11/09/21 1526 11/09/21 1959 11/10/21 0440 11/10/21 0705  BP: 117/76 115/66 114/70   Pulse: 69 80 78   Resp: '20 17 20   '$ Temp: 97.7 F (36.5 C) 99.6 F (37.6 C) (!) 100.5 F (38.1 C) 99.5 F (37.5 C)  TempSrc: Oral Oral Oral Oral  SpO2: 98% 98% 97%   Weight:      Height:        Intake/Output Summary (Last 24 hours) at 11/10/2021 1102 Last data filed at 11/10/2021 2458 Gross per 24 hour  Intake 3224.22 ml  Output 750 ml  Net 2474.22 ml    Filed Weights   11/08/21 0304 11/09/21 0033 11/09/21 0219  Weight: 81.1 kg 80.8 kg 80.8 kg    Examination:  General: A/O x4, No acute respiratory distress:  Eyes: negative scleral hemorrhage, negative anisocoria, negative icterus ENT: Negative Runny nose, negative gingival bleeding, Neck:  Negative scars, masses, torticollis, lymphadenopathy, JVD Lungs: Clear to  auscultation bilaterally without wheezes or crackles Cardiovascular: Regular rhythm and rate without murmur gallop or rub normal  S1 and S2 Abdomen: Mild abdominal pain with palpation, nondistended, positive soft, bowel sounds, no rebound, no ascites, no appreciable mass Extremities: No significant cyanosis, clubbing, or edema bilateral lower extremities Skin: Negative rashes, lesions, ulcers Psychiatric:  Negative depression, negative anxiety, negative fatigue, negative mania  Central nervous system:  Cranial nerves II through XII intact, tongue/uvula midline, all extremities muscle strength 5/5, sensation intact throughout, negative dysarthria, negative expressive aphasia, negative receptive aphasia.  .     Data Reviewed: Care during the described time interval was provided by me .  I have reviewed this patient's available data, including medical history, events of note, physical examination, and all test results as part of my evaluation.  CBC: Recent Labs  Lab 11/06/21 1612 11/07/21 0705 11/08/21 0243 11/09/21 0348 11/10/21 0404  WBC 27.8* 27.2* 25.9* 22.3* 21.2*  NEUTROABS 24.5* 22.5* 21.6* 18.9* 18.2*  HGB 10.7* 9.5* 9.6* 8.7* 8.5*  HCT 32.5* 27.8* 28.0* 26.0* 25.6*  MCV 87.8 85.5 85.1 85.2 85.0  PLT 342 325 317 335 170    Basic Metabolic Panel: Recent Labs  Lab 11/07/21 0705 11/07/21 1927 11/08/21 0243 11/09/21 0348 11/10/21 0404  NA 137 134* 137 139 137  K 3.5 3.6 3.2* 3.6 3.3*  CL 109 108 110 113* 112*  CO2 19* 16* 16* 18* 19*  GLUCOSE 101* 98 107* 107* 107*  BUN '19 17 17 17 10  '$ CREATININE 1.17 1.06 1.04 0.93 0.79  CALCIUM 8.5* 8.4* 8.4* 8.6* 8.2*  MG 1.7 1.7 1.8 1.9 1.7  PHOS 3.2 2.7 2.7 1.9* 2.7    GFR: Estimated Creatinine Clearance: 95.4 mL/min (by C-G formula based on SCr of 0.79 mg/dL). Liver Function Tests: Recent Labs  Lab 11/06/21 1612 11/07/21 0705 11/08/21 0243 11/09/21 0348 11/10/21 0404  AST 13* 13* '15 18 15  '$ ALT '11 10 11 14 14   '$ ALKPHOS 96 88 89 81 76  BILITOT 0.7 1.0 0.7 0.6 0.7  PROT 6.5 6.1* 6.0* 6.2* 5.6*  ALBUMIN 2.8* 2.5* 2.4* 2.8* 2.5*    No results for input(s): "LIPASE", "AMYLASE" in the last 168 hours. No results for input(s): "AMMONIA" in the last 168 hours. Coagulation Profile: No results for input(s): "INR", "PROTIME" in the last 168 hours. Cardiac Enzymes: No results for input(s): "CKTOTAL", "CKMB", "CKMBINDEX", "TROPONINI" in the last 168 hours. BNP (last 3 results) No results for input(s): "PROBNP" in the last 8760 hours. HbA1C: No results for input(s): "HGBA1C" in the last 72 hours. CBG: No results for input(s): "GLUCAP" in the last 168 hours. Lipid Profile: No results for input(s): "CHOL", "HDL", "LDLCALC", "TRIG", "CHOLHDL", "LDLDIRECT" in the last 72 hours. Thyroid Function Tests: Recent Labs    11/08/21 0828  TSH 2.248     Anemia Panel: No results for input(s): "VITAMINB12", "FOLATE", "FERRITIN", "TIBC", "IRON", "RETICCTPCT" in the last 72 hours. Sepsis Labs: Recent Labs  Lab 11/04/21 1804 11/04/21 1809 11/04/21 1928 11/05/21 0652 11/06/21 0547 11/06/21 1612 11/06/21 1909 11/06/21 2254  PROCALCITON 0.16  --   --  0.30 0.39  --   --   --   LATICACIDVEN  --    < > 0.9  --   --  1.1 1.2 0.8   < > = values in this interval not displayed.     Recent Results (from the past 240 hour(s))  Group A Strep by PCR     Status: None   Collection Time: 11/04/21  3:07 PM   Specimen: Throat; Sterile Swab  Result Value Ref Range Status  Group A Strep by PCR NOT DETECTED NOT DETECTED Final    Comment: Performed at [facility], [place] [address] [place], [place] [address]  Blood culture (routine x 2)     Status: None   Collection Time: 11/04/21  3:42 PM   Specimen: BLOOD RIGHT HAND  Result Value Ref Range Status   Specimen Description   Final    BLOOD RIGHT HAND BLOOD Performed at [place] [address], [place], [place] [address]     Special Requests   Final    Blood Culture results may not be optimal due to an inadequate volume of blood received in culture bottles BOTTLES DRAWN AEROBIC AND ANAEROBIC Performed at [facility], [place] [address] [place], [place] [address]    Culture   Final    NO GROWTH 5 DAYS Performed at [facility], [place] [address] [place], [place] [address]    Report Status 11/09/2021 FINAL  Final  Blood culture (routine x 2)     Status: None   Collection Time: 11/04/21  4:00 PM   Specimen: BLOOD RIGHT ARM  Result Value Ref Range Status   Specimen Description   Final    BLOOD RIGHT ARM BLOOD Performed at [place] [address] [place], [place] [address]    Special Requests   Final    Blood Culture adequate volume BOTTLES DRAWN AEROBIC AND ANAEROBIC Performed at [place] [address], [place], [place] [address]    Culture   Final    NO GROWTH 5 DAYS Performed at [facility], [place] [address] [place], [place] [address]    Report Status 11/09/2021 FINAL  Final  Urine Culture     Status: Abnormal   Collection Time: 11/04/21 10:01 PM   Specimen: Urine, Clean Catch  Result Value Ref Range Status   Specimen Description   Final    URINE, CLEAN CATCH Performed at [facility], [place] [address], [place], [place] [address]    Special Requests   Final    NONE Performed at [facility], [place] [address], [place], [place] [address]    Culture (A)  Final    <10,000 COLONIES/mL INSIGNIFICANT GROWTH Performed at [place] [address] [place],  [address]    Report Status 11/06/2021 FINAL  Final  SARS Coronavirus 2 by RT PCR (hospital order, performed in [facility] hospital lab) *cepheid single result test* Anterior Nasal Swab     Status: None   Collection Time: 11/05/21  8:37 AM   Specimen: Anterior Nasal Swab  Result Value Ref Range Status   SARS Coronavirus 2  by RT PCR NEGATIVE NEGATIVE Final    Comment: (NOTE) SARS-CoV-2 target nucleic acids are NOT DETECTED.  The SARS-CoV-2 RNA is generally detectable in upper and lower respiratory specimens during the acute phase of infection. The lowest concentration of SARS-CoV-2 viral copies this assay can detect is 250 copies / mL. A negative result does not preclude SARS-CoV-2 infection and should not be used as the sole basis for treatment or other patient management decisions.  A negative result may occur with improper specimen collection / handling, submission of specimen other than nasopharyngeal swab, presence of viral mutation(s) within the areas targeted by this assay, and inadequate number of viral copies (<250 copies / mL). A negative result must be combined with clinical observations, patient history, and epidemiological information.  Fact  Sheet for Patients:   https://www.patel.info/  Fact Sheet for Healthcare Providers: https://hall.com/  This test is not yet approved or  cleared by the [place] FDA and has been authorized for detection and/or diagnosis of SARS-CoV-2 by FDA under an Emergency Use Authorization (EUA).  This EUA will remain in effect (meaning this test can be used) for the duration of the COVID-19 declaration under Section 564(b)(1) of the Act, 21 U.S.C. section 360bbb-3(b)(1), unless the authorization is terminated or revoked sooner.  Performed at [facility], [place] [address], [place], [place] [address]   Culture, blood (Routine X 2) w Reflex to ID Panel     Status: None (Preliminary result)   Collection Time: 11/06/21  4:12 PM   Specimen: BLOOD  Result Value Ref Range Status   Specimen Description   Final    BLOOD RIGHT ANTECUBITAL Performed at [place] [address] [place], [place] [address]    Special Requests   Final    BOTTLES DRAWN AEROBIC ONLY Blood Culture  adequate volume Performed at [place] [address], [place], [place] [address]    Culture   Final    NO GROWTH 3 DAYS Performed at [facility], [place] [address] [place], [place] [address]    Report Status PENDING  Incomplete  Culture, blood (Routine X 2) w Reflex to ID Panel     Status: None (Preliminary result)   Collection Time: 11/06/21  4:12 PM   Specimen: BLOOD  Result Value Ref Range Status   Specimen Description   Final    BLOOD LEFT ANTECUBITAL Performed at [place] [address], [place], [place] [address]    Special Requests   Final    BOTTLES DRAWN AEROBIC ONLY Blood Culture adequate volume Performed at [place] [address], [place], [place] [address]    Culture   Final    NO GROWTH 3 DAYS Performed at [facility], [place] [address], [place], [place] [address]    Report Status PENDING  Incomplete  Urine Culture     Status: None   Collection Time: 11/06/21  6:28 PM   Specimen: Urine, Clean Catch  Result Value Ref Range Status   Specimen Description   Final    URINE, CLEAN CATCH Performed at [facility], [place] [address] [place], [place] [address]    Special Requests   Final    NONE Performed at [facility], [place] [address] [place], [place] [address]    Culture   Final    NO GROWTH Performed at [facility], [place] [address], [place],  [address]    Report Status 11/08/2021 FINAL  Final         Radiology Studies: No results found.      Scheduled Meds:  ALPRAZolam  1 mg Oral QHS   And   ALPRAZolam  0.5 mg Oral Daily   apixaban  5 mg Oral BID   Chlorhexidine Gluconate Cloth  6 each Topical Daily   darifenacin  7.5 mg Oral Daily   docusate sodium  100 mg Oral BID   feeding supplement  237 mL Oral QID   gabapentin  600 mg Oral TID   levothyroxine  50 mcg Oral QAC breakfast   multivitamin with minerals  1 tablet  Oral Daily   pantoprazole  40 mg Oral BID   pravastatin  40 mg Oral Daily  sodium chloride flush  10-40 mL Intracatheter Q12H   tamsulosin  0.4 mg Oral BID   Continuous Infusions:  sodium chloride 75 mL/hr at 11/09/21 2142   amiodarone 30 mg/hr (11/10/21 0418)   cefTRIAXone (ROCEPHIN)  IV 2 g (11/09/21 2244)     LOS: 5 days    Time spent:40 min    Abayomi Pattison, Geraldo Docker, MD [facility]   If 7PM-7AM, please contact night-coverage 11/10/2021, 11:02 AM

## 2021-11-11 ENCOUNTER — Inpatient Hospital Stay (HOSPITAL_COMMUNITY): Payer: Medicare HMO

## 2021-11-11 DIAGNOSIS — R0600 Dyspnea, unspecified: Secondary | ICD-10-CM | POA: Diagnosis not present

## 2021-11-11 LAB — CBC WITH DIFFERENTIAL/PLATELET
Abs Immature Granulocytes: 0.22 10*3/uL — ABNORMAL HIGH (ref 0.00–0.07)
Basophils Absolute: 0.1 10*3/uL (ref 0.0–0.1)
Basophils Relative: 0 %
Eosinophils Absolute: 0.1 10*3/uL (ref 0.0–0.5)
Eosinophils Relative: 0 %
HCT: 26.3 % — ABNORMAL LOW (ref 39.0–52.0)
Hemoglobin: 8.9 g/dL — ABNORMAL LOW (ref 13.0–17.0)
Immature Granulocytes: 1 %
Lymphocytes Relative: 9 %
Lymphs Abs: 1.8 10*3/uL (ref 0.7–4.0)
MCH: 28.6 pg (ref 26.0–34.0)
MCHC: 33.8 g/dL (ref 30.0–36.0)
MCV: 84.6 fL (ref 80.0–100.0)
Monocytes Absolute: 1.5 10*3/uL — ABNORMAL HIGH (ref 0.1–1.0)
Monocytes Relative: 7 %
Neutro Abs: 17.7 10*3/uL — ABNORMAL HIGH (ref 1.7–7.7)
Neutrophils Relative %: 83 %
Platelets: 391 10*3/uL (ref 150–400)
RBC: 3.11 MIL/uL — ABNORMAL LOW (ref 4.22–5.81)
RDW: 14.8 % (ref 11.5–15.5)
WBC: 21.3 10*3/uL — ABNORMAL HIGH (ref 4.0–10.5)
nRBC: 0 % (ref 0.0–0.2)

## 2021-11-11 LAB — COMPREHENSIVE METABOLIC PANEL
ALT: 14 U/L (ref 0–44)
AST: 17 U/L (ref 15–41)
Albumin: 2.4 g/dL — ABNORMAL LOW (ref 3.5–5.0)
Alkaline Phosphatase: 79 U/L (ref 38–126)
Anion gap: 7 (ref 5–15)
BUN: 9 mg/dL (ref 8–23)
CO2: 18 mmol/L — ABNORMAL LOW (ref 22–32)
Calcium: 8.1 mg/dL — ABNORMAL LOW (ref 8.9–10.3)
Chloride: 109 mmol/L (ref 98–111)
Creatinine, Ser: 0.88 mg/dL (ref 0.61–1.24)
GFR, Estimated: >60 mL/min (ref >60)
Glucose, Bld: 107 mg/dL — ABNORMAL HIGH (ref 70–99)
Potassium: 3.7 mmol/L (ref 3.5–5.1)
Sodium: 134 mmol/L — ABNORMAL LOW (ref 135–145)
Total Bilirubin: 0.9 mg/dL (ref 0.3–1.2)
Total Protein: 6.1 g/dL — ABNORMAL LOW (ref 6.5–8.1)

## 2021-11-11 LAB — CULTURE, BLOOD (ROUTINE X 2)
Culture: NO GROWTH
Culture: NO GROWTH
Special Requests: ADEQUATE
Special Requests: ADEQUATE

## 2021-11-11 LAB — PHOSPHORUS: Phosphorus: 2.5 mg/dL (ref 2.5–4.6)

## 2021-11-11 LAB — MAGNESIUM: Magnesium: 1.8 mg/dL (ref 1.7–2.4)

## 2021-11-11 MED ORDER — IOHEXOL 300 MG/ML  SOLN
100.0000 mL | Freq: Once | INTRAMUSCULAR | Status: AC | PRN
  Administered 2021-11-11: 100 mL via INTRAVENOUS

## 2021-11-11 MED ORDER — IOHEXOL 9 MG/ML PO SOLN
ORAL | Status: AC
  Administered 2021-11-11: 500 mL
  Filled 2021-11-11: qty 1000

## 2021-11-11 MED ORDER — IOHEXOL 9 MG/ML PO SOLN
500.0000 mL | ORAL | Status: AC
  Administered 2021-11-11: 500 mL via ORAL

## 2021-11-11 MED ORDER — SODIUM CHLORIDE (PF) 0.9 % IJ SOLN
INTRAMUSCULAR | Status: AC
  Administered 2021-11-13: 10 mL
  Filled 2021-11-11: qty 50

## 2021-11-11 NOTE — Progress Notes (Signed)
Occupational Therapy Treatment Patient Details Name: Marvin Gill MRN: [id number] DOB: [birth year] Today's Date: 11/11/2021   History of present illness patient is a 71 year old male who presented to the hosptial adter fall at home with syncopal episodes and increased shortness of breath. patient was admitted with dyspnea on exertion, syncope, orthostatic hypotension and acute diastolic CHF.   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 He is unsteady with standing at sink with a loss of balance at sink and then to step to recliner. He is pushing walker away and not using it safely. Patient needing assistance to set up food  tray and asking "What is that?" Therapist recommends short term rehab at discharge. Patient reports he wants to go home but would need 24/7 assistance if he returns home as he is currently confused and unsteady with a history of multiple falls.    Recommendations for follow up therapy are one component of a multi-disciplinary discharge planning process, led by the attending physician.  Recommendations may be updated based on patient status, additional functional criteria and insurance authorization.    Follow Up Recommendations  Skilled nursing-short term rehab (<3 hours/day)    Assistance Recommended at Discharge Frequent or constant Supervision/Assistance  Patient can return home with the following  A little help with walking and/or transfers;Assistance with cooking/housework;Direct supervision/assist for financial management;Direct supervision/assist for medications management;Help  with stairs or ramp for entrance;Assist for transportation;A lot of help with bathing/dressing/bathroom   Equipment Recommendations  Other (comment) (Defer to next venue)    Recommendations for Other Services      Precautions / Restrictions Precautions Precautions: Fall Precaution Comments: orthostatic Restrictions Weight Bearing Restrictions: No       Mobility Bed Mobility                    Transfers                         Balance Overall balance assessment: Needs assistance Sitting-balance support: No upper extremity supported, Feet supported Sitting balance-Leahy Scale: Poor     Standing balance support: During functional activity Standing balance-Leahy Scale: Poor                             ADL either performed or assessed with clinical judgement   ADL Overall ADL's : Needs assistance/impaired     Grooming: Wash/dry hands;Standing Grooming Details (indicate cue type and reason): stood at sink but requiring min assist to maintain balance to wash his hands.             Lower Body Dressing: Minimal assistance;Sitting/lateral leans Lower Body Dressing Details (indicate cue type and reason): min assist to don socks - min assist for right sock but able to  get on left - however needing min assist to maintain balance during donning socks             Functional mobility during ADLs: Minimal assistance General ADL Comments: MIn assist to stand and take steps to sink with RW. Patient  pushing walker away and propping on elbows on sink. had loss of balance in standing at sink and then taking step toward srecliner. Patient asking therapist "where am I?"    Extremity/Trunk Assessment Upper Extremity Assessment Upper Extremity Assessment: Generalized weakness   Lower Extremity Assessment Lower Extremity Assessment: Generalized weakness   Cervical / Trunk Assessment Cervical / Trunk Assessment: Normal    Vision   Vision  Assessment?: No apparent visual deficits   Perception     Praxis      Cognition Arousal/Alertness: Awake/alert Behavior During Therapy: Flat affect Overall Cognitive Status: No family/caregiver present to determine baseline cognitive functioning Area of Impairment: Memory, Orientation                 Orientation Level: Disoriented to, Person   Memory: Decreased short-term memory         General Comments: Is unaware of his location and asking where he is/        Exercises      Shoulder Instructions       General Comments      Pertinent Vitals/ Pain       Pain Assessment Pain Assessment: No/denies pain  Home Living                                          Prior Functioning/Environment              Frequency  Min 2X/week        Progress Toward Goals  OT Goals(current goals can now be found in the care plan section)  Progress towards OT goals: Progressing toward goals  Acute Rehab OT Goals Patient Stated Goal: to go home OT Goal Formulation: With patient Time For Goal Achievement: 11/21/21 Potential to Achieve Goals: [place] Discharge plan needs to be updated    Co-evaluation                 AM-PAC OT "6 Clicks" Daily Activity     Outcome Measure   Help from another person eating meals?: A Little Help from another person taking care of personal grooming?: A Little Help from another person toileting, which includes using toliet, bedpan, or urinal?: A Lot Help from another person bathing (including washing, rinsing, drying)?: A Lot   Help from another person to put on and taking off regular lower body clothing?: A Lot 6 Click Score: 12    End of Session Equipment Utilized During Treatment: Rolling walker (2 wheels)  OT Visit Diagnosis: Unsteadiness on feet (R26.81);History of falling (Z91.81);Muscle weakness (generalized) (M62.81)   Activity Tolerance Patient limited by fatigue   Patient Left in  chair;with call bell/phone within reach;with chair alarm set   Nurse Communication  (okay to see)        Time: 5625-6389 OT Time Calculation (min): 17 min  Charges: OT General Charges $OT Visit: 1 Visit OT Treatments $Self Care/Home Management : 8-22 mins  Derl Barrow, OTR/L [place]  Office [phone] Pager: [place] 11/11/2021, 10:05 AM

## 2021-11-11 NOTE — TOC Progression Note (Signed)
Transition of Care [facility]) - Progression Note    Patient Details  Name: Marvin Gill MRN: [id number] Date of Birth: 30-Oct-1950  [facility] CM/SW Contact  Purcell Mouton, RN Phone Number: 11/11/2021, 2:06 PM  Clinical Narrative:    Pt's daughter [name] called today. Hillary, states that she is a nurse CM, does want father to go to SNF and asked if Medicaid application could be started here in the hospital. [name], also states that no one has called her with discharge plans. Explained to [facility] that [facility] contract with [employer]. Will continue to follow for discharge needs. Pt has a Environmental consultant at home.       Barriers to Discharge: Continued Medical Work up  Expected Discharge Plan and Services                                                 Social Determinants of Health (SDOH) Interventions    Readmission Risk Interventions     No data to display

## 2021-11-11 NOTE — Progress Notes (Signed)
Notified on call provider about patient having a rash on back. Applied moisture barrier cream to patient's back.

## 2021-11-11 NOTE — Plan of Care (Signed)

## 2021-11-11 NOTE — Progress Notes (Signed)
PROGRESS NOTE    Marvin Gill  GEX:[id number] DOB: 1951-02-25 DOA: 11/04/2021 PCP: Lavone Orn, MD   Brief Narrative: 71 year old male with multiple comorbidities admitted with gradually worsening shortness of breath and dyspnea on exertion.  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  He is due to start chemotherapy on 11/09/2021.  However he has been in the hospital.  He reports poor oral intake and significant weight loss of 50 pounds.  He was off Eliquis for 2 weeks prior to admission due to gross hematuria which has been resolved now he is back on his Eliquis.  Assessment & Plan:   Principal Problem:   Dyspnea Active Problems:   Benign localized prostatic hyperplasia with lower urinary tract symptoms (LUTS)   PAF (paroxysmal atrial fibrillation) (HCC)   Bladder cancer (HCC)   COPD with emphysema (HCC)   GERD (gastroesophageal reflux disease)   Hypothyroidism   Dyspnea on exertion   Leukocytosis   Protein-calorie malnutrition, severe   Ventricular tachycardia (HCC)   #1 paroxysmal atrial fibrillation continue Eliquis and amiodarone rate is controlled.  He received 1 dose of digoxin.  #2 V. tach continue amiodarone.  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 Alen Blew.  He was due to start chemo 11/09/2021.  #4 persistent leukocytosis with abdominal pain-check CT abdomen and pelvis to rule out any occult infection.  #5 dyspnea on exertion likely due to acute diastolic heart failure with grade 1 diastolic dysfunction by echo normal ejection fraction.  CT chest with no evidence of PE.   #6 frequent falls secondary to orthostatic hypotension-patient has been on IV fluids since 11/04/2021.  IV fluids stopped today.  I  will put him on TED hose  #7 hypothyroidism on Synthroid  #8 multiple electrolyte abnormalities hypokalemia, hypomagnesemia, hypophosphatemia replete and recheck labs.       Nutrition Problem: Severe Malnutrition Etiology: chronic illness, cancer and cancer related treatments     Signs/Symptoms: percent weight loss, severe muscle depletion, mild fat depletion    Interventions: Ensure Enlive (each supplement provides 350kcal and 20 grams of protein), MVI, Education  Estimated body mass index is 21.25 kg/m as calculated from the following:   Height as of this encounter: '6\' 6"'$  (1.981 m).   Weight as of this encounter: 83.4 kg.  DVT prophylaxis: Eliquis  code Status: Full code Family Communication: None Disposition Plan:  Status is: Inpatient Remains inpatient appropriate because: Hypoxia ongoing abdominal pain and leukocytosis   Consultants:  None  Procedures: None Antimicrobials: None  Subjective: He is laying in bed very weak and deconditioned He is on room air with normal saturation with exertion his saturation drops to 86 to 88%. He was orthostatic from lying down to sitting up with a systolic blood pressure of 100 down to 82.  Objective: Vitals:   11/11/21 0547 11/11/21 0930 11/11/21 0955 11/11/21 1226  BP: 127/69  103/64 112/81  Pulse: 93  96 77  Resp: (!) 22   (!) 22  Temp: 99.5 F (37.5 C)   98 F (36.7 C)  TempSrc:    Oral  SpO2: 97%  100% 98%  Weight:  83.4 kg    Height:  '6\' 6"'$  (1.981 m)      Intake/Output Summary (Last 24 hours) at 11/11/2021 1402  Last data filed at 11/11/2021 1030 Gross per 24 hour  Intake 3433.32 ml  Output 1125 ml  Net 2308.32 ml   Filed Weights   11/09/21 0219 11/11/21 0500 11/11/21 0930  Weight: 80.8 kg 83.8 kg 83.4 kg    Examination:  General exam: Appears chronically ill looking  respiratory system: Clear to auscultation. Respiratory effort normal. Cardiovascular system: S1 & S2 heard, RRR. No JVD, murmurs, rubs,  gallops or clicks. No pedal edema. Gastrointestinal system: Abdomen is nondistended, soft and tender left  anterior side  No organomegaly or masses felt. Normal bowel sounds heard. Central nervous system: Alert and oriented. No focal neurological deficits. Extremities: Trace edema  skin: No rashes, lesions or ulcers Psychiatry: Judgement and insight appear normal. Mood & affect appropriate.     Data Reviewed: I have personally reviewed following labs and imaging studies  CBC: Recent Labs  Lab 11/07/21 0705 11/08/21 0243 11/09/21 0348 11/10/21 0404 11/11/21 0401  WBC 27.2* 25.9* 22.3* 21.2* 21.3*  NEUTROABS 22.5* 21.6* 18.9* 18.2* 17.7*  HGB 9.5* 9.6* 8.7* 8.5* 8.9*  HCT 27.8* 28.0* 26.0* 25.6* 26.3*  MCV 85.5 85.1 85.2 85.0 84.6  PLT 325 317 335 351 384   Basic Metabolic Panel: Recent Labs  Lab 11/07/21 1927 11/08/21 0243 11/09/21 0348 11/10/21 0404 11/11/21 0401  NA 134* 137 139 137 134*  K 3.6 3.2* 3.6 3.3* 3.7  CL 108 110 113* 112* 109  CO2 16* 16* 18* 19* 18*  GLUCOSE 98 107* 107* 107* 107*  BUN '17 17 17 10 9  '$ CREATININE 1.06 1.04 0.93 0.79 0.88  CALCIUM 8.4* 8.4* 8.6* 8.2* 8.1*  MG 1.7 1.8 1.9 1.7 1.8  PHOS 2.7 2.7 1.9* 2.7 2.5   GFR: Estimated Creatinine Clearance: 89.5 mL/min (by C-G formula based on SCr of 0.88 mg/dL). Liver Function Tests: Recent Labs  Lab 11/07/21 0705 11/08/21 0243 11/09/21 0348 11/10/21 0404 11/11/21 0401  AST 13* '15 18 15 17  '$ ALT '10 11 14 14 14  '$ ALKPHOS 88 89 81 76 79  BILITOT 1.0 0.7 0.6 0.7 0.9  PROT 6.1* 6.0* 6.2* 5.6* 6.1*  ALBUMIN 2.5* 2.4* 2.8* 2.5* 2.4*   No results for input(s): "LIPASE", "AMYLASE" in the last 168 hours. No results for input(s): "AMMONIA" in the last 168 hours. Coagulation Profile: No results for input(s): "INR", "PROTIME" in the last 168 hours. Cardiac Enzymes: No results for input(s): "CKTOTAL", "CKMB", "CKMBINDEX", "TROPONINI" in the last 168 hours. BNP (last 3 results) No results for  input(s): "PROBNP" in the last 8760 hours. HbA1C: No results for input(s): "HGBA1C" in the last 72 hours. CBG: No results for input(s): "GLUCAP" in the last 168 hours. Lipid Profile: No results for input(s): "CHOL", "HDL", "LDLCALC", "TRIG", "CHOLHDL", "LDLDIRECT" in the last 72 hours. Thyroid Function Tests: No results for input(s): "TSH", "T4TOTAL", "FREET4", "T3FREE", "THYROIDAB" in the last 72 hours. Anemia Panel: No results for input(s): "VITAMINB12", "FOLATE", "FERRITIN", "TIBC", "IRON", "RETICCTPCT" in the last 72 hours. Sepsis Labs: Recent Labs  Lab 11/04/21 1804 11/04/21 1809 11/04/21 1928 11/05/21 0652 11/06/21 0547 11/06/21 1612 11/06/21 1909 11/06/21 2254  PROCALCITON 0.16  --   --  0.30 0.39  --   --   --   LATICACIDVEN  --    < > 0.9  --   --  1.1 1.2 0.8   < > = values in this interval not displayed.    Recent Results (from the past 240 hour(s))  Group A Strep by PCR  Status: None   Collection Time: 11/04/21  3:07 PM   Specimen: Throat; Sterile Swab  Result Value Ref Range Status   Group A Strep by PCR NOT DETECTED NOT DETECTED Final    Comment: Performed at [facility], [place] [address] [place], [place] [address]  Blood culture (routine x 2)     Status: None   Collection Time: 11/04/21  3:42 PM   Specimen: BLOOD RIGHT HAND  Result Value Ref Range Status   Specimen Description   Final    BLOOD RIGHT HAND BLOOD Performed at [place] [address], [place], [place] [address]    Special Requests   Final    Blood Culture results may not be optimal due to an inadequate volume of blood received in culture bottles BOTTLES DRAWN AEROBIC AND ANAEROBIC Performed at [facility], [place] [address], [place], [place] [address]    Culture   Final    NO GROWTH 5 DAYS Performed at [facility], [place] [address] [place], [place] [address]    Report Status 11/09/2021 FINAL  Final  Blood culture  (routine x 2)     Status: None   Collection Time: 11/04/21  4:00 PM   Specimen: BLOOD RIGHT ARM  Result Value Ref Range Status   Specimen Description   Final    BLOOD RIGHT ARM BLOOD Performed at [place] [address] [place], [place] [address]    Special Requests   Final    Blood Culture adequate volume BOTTLES DRAWN AEROBIC AND ANAEROBIC Performed at [place] [address] [place], [place] [address]    Culture   Final    NO GROWTH 5 DAYS Performed at [facility], [place] [address], [place], [place] [address]    Report Status 11/09/2021 FINAL  Final  Urine Culture     Status: Abnormal   Collection Time: 11/04/21 10:01 PM   Specimen: Urine, Clean Catch  Result Value Ref Range Status   Specimen Description   Final    URINE, CLEAN CATCH Performed at [facility], [place] [address] [place], [place] [address]    Special Requests   Final    NONE Performed at [facility], [place] [address] [place], [place] [address]    Culture (A)  Final    <10,000 COLONIES/mL INSIGNIFICANT GROWTH Performed at [place] [address], [place], [place] [address]    Report Status 11/06/2021 FINAL  Final  SARS Coronavirus 2 by RT PCR (hospital order, performed in [facility] hospital lab) *cepheid single result test* Anterior Nasal Swab     Status: None   Collection Time: 11/05/21  8:37 AM   Specimen: Anterior Nasal Swab  Result Value Ref Range Status   SARS Coronavirus 2 by RT PCR NEGATIVE NEGATIVE Final    Comment: (NOTE) SARS-CoV-2 target nucleic acids are NOT DETECTED.  The SARS-CoV-2 RNA is generally detectable in upper and lower respiratory specimens during the acute phase of infection. The lowest concentration of SARS-CoV-2 viral copies this assay can detect is 250 copies / mL. A negative result does not preclude SARS-CoV-2 infection and should not be used as the sole basis for treatment  or other patient management decisions.  A negative result may occur with improper specimen collection / handling, submission of specimen other than nasopharyngeal swab, presence of viral mutation(s) within the areas targeted by this assay, and inadequate  number of viral copies (<250 copies / mL). A negative result must be combined with clinical observations, patient history, and epidemiological information.  Fact Sheet for Patients:   https://www.patel.info/  Fact Sheet for Healthcare Providers: https://hall.com/  This test is not yet approved or  cleared by the [place] FDA and has been authorized for detection and/or diagnosis of SARS-CoV-2 by FDA under an Emergency Use Authorization (EUA).  This EUA will remain in effect (meaning this test can be used) for the duration of the COVID-19 declaration under Section 564(b)(1) of the Act, 21 U.S.C. section 360bbb-3(b)(1), unless the authorization is terminated or revoked sooner.  Performed at [facility], [place] [address], [place], [place] [address]   Culture, blood (Routine X 2) w Reflex to ID Panel     Status: None   Collection Time: 11/06/21  4:12 PM   Specimen: BLOOD  Result Value Ref Range Status   Specimen Description   Final    BLOOD RIGHT ANTECUBITAL Performed at [place] [address] [place], [place] [address]    Special Requests   Final    BOTTLES DRAWN AEROBIC ONLY Blood Culture adequate volume Performed at [place] [address], [place], [place] [address]    Culture   Final    NO GROWTH 5 DAYS Performed at [facility], [employer] [place] [employer] [address], [place], [place] [address]    Report Status 11/11/2021 FINAL  Final  Culture, blood (Routine X 2) w Reflex to ID Panel     Status: None   Collection Time: 11/06/21  4:12 PM   Specimen: BLOOD  Result Value Ref Range Status   Specimen Description   Final     BLOOD LEFT ANTECUBITAL Performed at [place] [address], [place], [place] [address]    Special Requests   Final    BOTTLES DRAWN AEROBIC ONLY Blood Culture adequate volume Performed at [place] [address], [place], [place] [address]    Culture   Final    NO GROWTH 5 DAYS Performed at [facility], [place] [address] [place], [place] [address]    Report Status 11/11/2021 FINAL  Final  Urine Culture     Status: None   Collection Time: 11/06/21  6:28 PM   Specimen: Urine, Clean Catch  Result Value Ref Range Status   Specimen Description   Final    URINE, CLEAN CATCH Performed at [facility], [place] [address], [place], [place] [address]    Special Requests   Final    NONE Performed at [facility], [place] [address] [place], [place] [address]    Culture   Final    NO GROWTH Performed at [facility], [place] [address], [place], [place] [address]    Report Status 11/08/2021 FINAL  Final         Radiology Studies: No results found.      Scheduled Meds:  ALPRAZolam  1 mg Oral QHS   And   ALPRAZolam  0.5 mg Oral Daily   amiodarone  100 mg Oral BID   apixaban  5 mg Oral BID   Chlorhexidine Gluconate Cloth  6 each Topical Daily   darifenacin  7.5 mg Oral Daily   docusate sodium  100 mg Oral BID   feeding supplement  237 mL Oral QID   gabapentin  600 mg Oral TID   levothyroxine  50 mcg  Oral QAC breakfast   multivitamin with minerals  1 tablet Oral Daily   pantoprazole  40 mg Oral BID   pravastatin  40 mg Oral Daily   sodium chloride flush  10-40 mL Intracatheter Q12H   tamsulosin  0.4 mg Oral BID   Continuous Infusions:  sodium chloride 75 mL/hr at 11/10/21 2318     LOS: 6 days    Time spent: 39 min  Georgette Shell, MD  11/11/2021, 2:02 PM

## 2021-11-11 NOTE — Progress Notes (Signed)
Patient is physically unable to complete orthostatic vitals, as well as a standing weight.

## 2021-11-12 ENCOUNTER — Other Ambulatory Visit: Payer: Self-pay | Admitting: Oncology

## 2021-11-12 DIAGNOSIS — R0600 Dyspnea, unspecified: Secondary | ICD-10-CM | POA: Diagnosis not present

## 2021-11-12 LAB — CBC WITH DIFFERENTIAL/PLATELET
Abs Immature Granulocytes: 0.23 10*3/uL — ABNORMAL HIGH (ref 0.00–0.07)
Basophils Absolute: 0.1 10*3/uL (ref 0.0–0.1)
Basophils Relative: 0 %
Eosinophils Absolute: 0.2 10*3/uL (ref 0.0–0.5)
Eosinophils Relative: 1 %
HCT: 25.9 % — ABNORMAL LOW (ref 39.0–52.0)
Hemoglobin: 8.8 g/dL — ABNORMAL LOW (ref 13.0–17.0)
Immature Granulocytes: 1 %
Lymphocytes Relative: 8 %
Lymphs Abs: 1.8 10*3/uL (ref 0.7–4.0)
MCH: 28.7 pg (ref 26.0–34.0)
MCHC: 34 g/dL (ref 30.0–36.0)
MCV: 84.4 fL (ref 80.0–100.0)
Monocytes Absolute: 1.4 10*3/uL — ABNORMAL HIGH (ref 0.1–1.0)
Monocytes Relative: 6 %
Neutro Abs: 18.8 10*3/uL — ABNORMAL HIGH (ref 1.7–7.7)
Neutrophils Relative %: 84 %
Platelets: 363 10*3/uL (ref 150–400)
RBC: 3.07 MIL/uL — ABNORMAL LOW (ref 4.22–5.81)
RDW: 14.8 % (ref 11.5–15.5)
WBC: 22.5 10*3/uL — ABNORMAL HIGH (ref 4.0–10.5)
nRBC: 0 % (ref 0.0–0.2)

## 2021-11-12 LAB — COMPREHENSIVE METABOLIC PANEL
ALT: 14 U/L (ref 0–44)
AST: 16 U/L (ref 15–41)
Albumin: 2.4 g/dL — ABNORMAL LOW (ref 3.5–5.0)
Alkaline Phosphatase: 76 U/L (ref 38–126)
Anion gap: 7 (ref 5–15)
BUN: 12 mg/dL (ref 8–23)
CO2: 20 mmol/L — ABNORMAL LOW (ref 22–32)
Calcium: 8.4 mg/dL — ABNORMAL LOW (ref 8.9–10.3)
Chloride: 109 mmol/L (ref 98–111)
Creatinine, Ser: 0.92 mg/dL (ref 0.61–1.24)
GFR, Estimated: >60 mL/min (ref >60)
Glucose, Bld: 98 mg/dL (ref 70–99)
Potassium: 3.8 mmol/L (ref 3.5–5.1)
Sodium: 136 mmol/L (ref 135–145)
Total Bilirubin: 1 mg/dL (ref 0.3–1.2)
Total Protein: 6.2 g/dL — ABNORMAL LOW (ref 6.5–8.1)

## 2021-11-12 LAB — URINALYSIS, ROUTINE W REFLEX MICROSCOPIC
Bilirubin Urine: NEGATIVE
Glucose, UA: NEGATIVE mg/dL
Ketones, ur: 5 mg/dL — AB
Nitrite: NEGATIVE
Protein, ur: 30 mg/dL — AB
RBC / HPF: >50 RBC/hpf — ABNORMAL HIGH (ref 0–5)
Specific Gravity, Urine: 1.017 (ref 1.005–1.030)
WBC, UA: >50 WBC/hpf — ABNORMAL HIGH (ref 0–5)
pH: 6 (ref 5.0–8.0)

## 2021-11-12 LAB — MAGNESIUM: Magnesium: 1.9 mg/dL (ref 1.7–2.4)

## 2021-11-12 LAB — PHOSPHORUS: Phosphorus: 3.4 mg/dL (ref 2.5–4.6)

## 2021-11-12 NOTE — Care Management Important Message (Signed)
Important Message  Patient Details IM Letter placed in Patients room. Name: BARCLAY LENNOX MRN: [id number] Date of Birth: 03-Feb-1951   Medicare Important Message Given:  Yes     Kerin Salen 11/12/2021, 1:11 PM

## 2021-11-12 NOTE — Progress Notes (Signed)
Physical Therapy Treatment Patient Details Name: Marvin Gill MRN: [id number] DOB: [birth year] Today's Date: 11/12/2021   History of Present Illness patient is a 71 year old male who presented to the hosptial adter fall at home with syncopal episodes and increased shortness of breath. patient was admitted with dyspnea on exertion, syncope, orthostatic hypotension and acute diastolic CHF.   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 Required min assist for bed mobility, min guard for transfer (min assist to steady walker), and min guard for ambulation in hallway with RW ~131f with max verbal cues for proximity to device. VSS. Pt in recliner at end of sessions, reporting no pain other than continued sore throat.  We will continue to follow him acutely.   Recommendations for follow up therapy are one component of a multi-disciplinary discharge planning process, led by the attending physician.  Recommendations may be updated based on patient status, additional functional criteria and insurance authorization.  Follow Up Recommendations  Home health PT     Assistance Recommended at Discharge Frequent or constant Supervision/Assistance  Patient can return home with the following A little help with bathing/dressing/bathroom;Assist for transportation;Help with stairs or ramp for entrance;Assistance with cooking/housework;A little help with walking and/or transfers   Equipment Recommendations  Rolling walker (2 wheels)    Recommendations for Other Services       Precautions / Restrictions Precautions Precautions: Fall Precaution Comments: orthostatic Restrictions Weight Bearing Restrictions: No     Mobility   Bed Mobility Overal bed mobility: Needs Assistance Bed Mobility: Supine to Sit     Supine to sit: Min assist     General bed mobility comments: Min assist for trunk elevation, scooting hips EOB with bed pad.    Transfers Overall transfer level: Needs assistance Equipment used: Rolling walker (2 wheels) Transfers: Sit to/from Stand Sit to Stand: From elevated surface, Min assist, Min guard           General transfer comment: Min assist for steadying of RW during rising from elevated surface, min guard otherwise.    Ambulation/Gait Ambulation/Gait assistance: Min guard Gait Distance (Feet): 120 Feet Assistive device: Rolling walker (2 wheels) Gait Pattern/deviations: Step-through pattern Gait velocity: decreased     General Gait Details: Pt ambulated 1215fwith RW and min guard, max verbal cues for proximity to device as pt would let it "run away with him," recliner follow for safety, no overt LOB.   Stairs             Wheelchair Mobility    Modified Rankin (Stroke Patients Only)       Balance Overall balance assessment: Needs assistance Sitting-balance support: No upper extremity supported, Feet supported Sitting balance-Leahy Scale: Poor Sitting balance - Comments: Upon acheiving upright sitting, required min assist for steadying as pt demonstrated posterior lean. Postural control: Posterior lean Standing balance support: During functional activity, Bilateral upper extremity supported, Reliant on assistive device for balance Standing balance-Leahy Scale: Poor                              Cognition Arousal/Alertness: Awake/alert Behavior During Therapy: Flat affect Overall Cognitive Status: No family/caregiver present to determine baseline  cognitive functioning Area of Impairment: Memory, Orientation                                        Exercises      General Comments        Pertinent Vitals/Pain Pain  Assessment Pain Assessment: No/denies pain    Home Living                          Prior Function            PT Goals (current goals can now be found in the care plan section) Acute Rehab PT Goals Patient Stated Goal: Regain IND PT Goal Formulation: With patient Time For Goal Achievement: 11/22/21 Potential to Achieve Goals: Fair Progress towards PT goals: Progressing toward goals    Frequency    Min 3X/week      PT Plan Discharge plan needs to be updated    Co-evaluation              AM-PAC PT "6 Clicks" Mobility   Outcome Measure  Help needed turning from your back to your side while in a flat bed without using bedrails?: A Little Help needed moving from lying on your back to sitting on the side of a flat bed without using bedrails?: A Little Help needed moving to and from a bed to a chair (including a wheelchair)?: A Little Help needed standing up from a chair using your arms (e.g., wheelchair or bedside chair)?: A Little Help needed to walk in hospital room?: A Little Help needed climbing 3-5 steps with a railing? : A Lot 6 Click Score: 17    End of Session Equipment Utilized During Treatment: Gait belt Activity Tolerance: Patient tolerated treatment well;No increased pain Patient left: in chair;with call bell/phone within reach;with chair alarm set Nurse Communication: Mobility status PT Visit Diagnosis: Muscle weakness (generalized) (M62.81);Difficulty in walking, not elsewhere classified (R26.2)     Time: 6283-1517 PT Time Calculation (min) (ACUTE ONLY): 22 min  Charges:  $Gait Training: 8-22 mins                    Coolidge Breeze, PT, DPT [facility] Department Office: [phone] Pager: [phone]   Coolidge Breeze 11/12/2021, 3:51 PM

## 2021-11-12 NOTE — Progress Notes (Signed)
Events of the last few days noted.  Patient's clinical status was updated and reviewed today.  This was also discussed with his daughter previously.  CT scan obtained on 11/11/2021 was personally reviewed which showed findings consistent with his previous PET scan with peritoneal disease including left pelvic sidewall and questionable hypodense lesion of the liver.  PET scan did not show any abnormalities in his liver at that time.  These findings were reiterated cancer with the patient and the importance of starting chemotherapy which will offer palliation and reduction in some of these tumors.  He understands that if his cancer continues to progress the opportunity to treated would be diminished.  He is scheduled to see me on 11/16/2021 for rediscussion as an outpatient.  All his questions were answered to his satisfaction.  25  minutes were dedicated to this visit.  50% of the time was face-to-face.  The time was spent on reviewing laboratory data, imaging studies, discussing treatment options, and answering questions regarding future plan.

## 2021-11-12 NOTE — Progress Notes (Addendum)
PROGRESS NOTE    Marvin Gill  GYI:[id number] DOB: [birth year] DOA: 11/04/2021 PCP: Lavone Orn, MD   Brief Narrative: 71 year old male with multiple comorbidities admitted with gradually worsening shortness of breath and dyspnea on exertion.  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  He is due to start chemotherapy on 11/09/2021.  However he has been in the hospital.  He reports poor oral intake and significant weight loss of 50 pounds.  He was off Eliquis for 2 weeks prior to admission due to gross hematuria which has been resolved now he is back on his Eliquis.  Assessment & Plan:   Principal Problem:   Dyspnea Active Problems:   Benign localized prostatic hyperplasia with lower urinary tract symptoms (LUTS)   PAF (paroxysmal atrial fibrillation) (HCC)   Bladder cancer (HCC)   COPD with emphysema (HCC)   GERD (gastroesophageal reflux disease)   Hypothyroidism   Dyspnea on exertion   Leukocytosis   Protein-calorie malnutrition, severe   Ventricular tachycardia (HCC)   #1 paroxysmal atrial fibrillation continue Eliquis and amiodarone rate is controlled.  He received 1 dose of digoxin.  #2 V. tach continue amiodarone.  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 Alen Blew.  He was due to start chemo 11/09/2021.  #4 persistent leukocytosis with abdominal pain- CHECK UA  CT ABD 6/14-Numerous new nodular densities in the left nephrectomy bed and along the expected course of the left ureter/retroperitoneum worrisome for disease recurrence. New nodular densities in the left paracolic gutter worrisome for metastatic disease. Stable mild wall thickening of the posterior bladder.New presacral edema. New 4.5 cm mass in the  liver worrisome for metastatic disease. Air in the bladder may be iatrogenic. Correlate clinically for infection. Mildly dilated proximal small bowel loops favored as ileus. Partial small bowel obstruction is not excluded. Contrast is seen to the level of the cecum.  Trace bilateral pleural effusions  #5 dyspnea on exertion likely due to acute diastolic heart failure with grade 1 diastolic dysfunction by echo normal ejection fraction.  CT chest with no evidence of PE.   #6 frequent falls secondary to orthostatic hypotension-patient has been on IV fluids since 11/04/2021.  IV fluids stopped 6/14 I will put him on TED hose  #7 hypothyroidism on Synthroid  #8 multiple electrolyte abnormalities hypokalemia, hypomagnesemia, hypophosphatemia -RESOLVED   Nutrition Problem: Severe Malnutrition Etiology: chronic illness, cancer and cancer related treatments     Signs/Symptoms: percent weight loss, severe muscle depletion, mild fat depletion    Interventions: Ensure Enlive (each supplement provides 350kcal and 20 grams of protein), MVI, Education  Estimated body mass index is 20.53 kg/m as calculated from the following:   Height as of this encounter: '6\' 6"'$  (1.981 m).   Weight as of this encounter: 80.6 kg.  DVT prophylaxis: Eliquis  code Status: Full code Family Communication: None Disposition Plan: Plan is DC home 11/13/2021 with daughter  remains inpatient appropriate because: Hypoxia ongoing abdominal pain and leukocytosis   Consultants:  None  Procedures: None Antimicrobials: None  Subjective: VERY WEAK CT ABD 6/14 AS ABOVE.  Objective: Vitals:   11/12/21 0500 11/12/21 0513 11/12/21 0518 11/12/21 0531  BP:  110/70 98/71 111/83  Pulse:  80 98 92  Resp:    20  Temp:  98.4  F (36.9 C)  98.4 F (36.9 C)  TempSrc:  Oral  Oral  SpO2:  98%    Weight: 80.6 kg     Height:        Intake/Output Summary (Last 24 hours) at 11/12/2021 1103 Last data filed at 11/11/2021  1814 Gross per 24 hour  Intake 637.88 ml  Output 450 ml  Net 187.88 ml    Filed Weights   11/11/21 0500 11/11/21 0930 11/12/21 0500  Weight: 83.8 kg 83.4 kg 80.6 kg    Examination:  General exam: Appears chronically ill looking  respiratory system: Clear to auscultation. Respiratory effort normal. Cardiovascular system: S1 & S2 heard, RRR. No JVD, murmurs, rubs, gallops or clicks. No pedal edema. Gastrointestinal system: Abdomen is nondistended, soft and tender left  anterior side AND SUPRA PUBIC  No organomegaly or masses felt. Normal bowel sounds heard. Central nervous system: Alert and oriented. No focal neurological deficits. Extremities: Trace edema  skin: No rashes, lesions or ulcers Psychiatry: Judgement and insight appear normal. Mood & affect appropriate.     Data Reviewed: I have personally reviewed following labs and imaging studies  CBC: Recent Labs  Lab 11/08/21 0243 11/09/21 0348 11/10/21 0404 11/11/21 0401 11/12/21 0359  WBC 25.9* 22.3* 21.2* 21.3* 22.5*  NEUTROABS 21.6* 18.9* 18.2* 17.7* 18.8*  HGB 9.6* 8.7* 8.5* 8.9* 8.8*  HCT 28.0* 26.0* 25.6* 26.3* 25.9*  MCV 85.1 85.2 85.0 84.6 84.4  PLT 317 335 351 391 330    Basic Metabolic Panel: Recent Labs  Lab 11/08/21 0243 11/09/21 0348 11/10/21 0404 11/11/21 0401 11/12/21 0359  NA 137 139 137 134* 136  K 3.2* 3.6 3.3* 3.7 3.8  CL 110 113* 112* 109 109  CO2 16* 18* 19* 18* 20*  GLUCOSE 107* 107* 107* 107* 98  BUN '17 17 10 9 12  '$ CREATININE 1.04 0.93 0.79 0.88 0.92  CALCIUM 8.4* 8.6* 8.2* 8.1* 8.4*  MG 1.8 1.9 1.7 1.8 1.9  PHOS 2.7 1.9* 2.7 2.5 3.4    GFR: Estimated Creatinine Clearance: 82.7 mL/min (by C-G formula based on SCr of 0.92 mg/dL). Liver Function Tests: Recent Labs  Lab 11/08/21 0243 11/09/21 0348 11/10/21 0404 11/11/21 0401 11/12/21 0359  AST '15 18 15 17 16  '$ ALT '11 14 14 14 14  '$ ALKPHOS 89 81 76 79 76  BILITOT 0.7 0.6 0.7 0.9 1.0  PROT 6.0* 6.2* 5.6* 6.1* 6.2*  ALBUMIN  2.4* 2.8* 2.5* 2.4* 2.4*    No results for input(s): "LIPASE", "AMYLASE" in the last 168 hours. No results for input(s): "AMMONIA" in the last 168 hours. Coagulation Profile: No results for input(s): "INR", "PROTIME" in the last 168 hours. Cardiac Enzymes: No results for input(s): "CKTOTAL", "CKMB", "CKMBINDEX", "TROPONINI" in the last 168 hours. BNP (last 3 results) No results for input(s): "PROBNP" in the last 8760 hours. HbA1C: No results for input(s): "HGBA1C" in the last 72 hours. CBG: No results for input(s): "GLUCAP" in the last 168 hours. Lipid Profile: No results for input(s): "CHOL", "HDL", "LDLCALC", "TRIG", "CHOLHDL", "LDLDIRECT" in the last 72 hours. Thyroid Function Tests: No results for input(s): "TSH", "T4TOTAL", "FREET4", "T3FREE", "THYROIDAB" in the last 72 hours. Anemia Panel: No results for input(s): "VITAMINB12", "FOLATE", "FERRITIN", "TIBC", "IRON", "RETICCTPCT" in the last 72 hours. Sepsis Labs: Recent Labs  Lab 11/06/21 0547 11/06/21 1612 11/06/21 1909 11/06/21 2254  PROCALCITON 0.39  --   --   --   LATICACIDVEN  --  1.1 1.2 0.8     Recent Results (  from the past 240 hour(s))  Group A Strep by PCR     Status: None   Collection Time: 11/04/21  3:07 PM   Specimen: Throat; Sterile Swab  Result Value Ref Range Status   Group A Strep by PCR NOT DETECTED NOT DETECTED Final    Comment: Performed at [facility], [place] [address] [place], [place] [address]  Blood culture (routine x 2)     Status: None   Collection Time: 11/04/21  3:42 PM   Specimen: BLOOD RIGHT HAND  Result Value Ref Range Status   Specimen Description   Final    BLOOD RIGHT HAND BLOOD Performed at [place] [address], [place], [place] [address]    Special Requests   Final    Blood Culture results may not be optimal due to an inadequate volume of blood received in culture bottles BOTTLES DRAWN AEROBIC AND ANAEROBIC Performed at [facility], [place] [address], [place], [place] [address]    Culture   Final    NO GROWTH 5 DAYS Performed at [facility], [place] [address], [place], [place] [address]    Report Status 11/09/2021 FINAL  Final  Blood culture (routine x 2)     Status: None   Collection Time: 11/04/21  4:00 PM   Specimen: BLOOD RIGHT ARM  Result Value Ref Range Status   Specimen Description   Final    BLOOD RIGHT ARM BLOOD Performed at [place] [address], [place], [place] [address]    Special Requests   Final    Blood Culture adequate volume BOTTLES DRAWN AEROBIC AND ANAEROBIC Performed at [place] [address], [place], [place] [address]    Culture   Final    NO GROWTH 5 DAYS Performed at [facility], [place] [address], [place], [place] [address]    Report Status 11/09/2021 FINAL  Final  Urine Culture     Status: Abnormal   Collection Time: 11/04/21 10:01 PM   Specimen: Urine, Clean Catch  Result Value Ref Range Status   Specimen Description   Final    URINE, CLEAN CATCH Performed at [facility], [place] [address] [place], [place] [address]    Special Requests   Final    NONE Performed at [facility], [place] [address], [place],  [address]    Culture (A)  Final    <10,000 COLONIES/mL INSIGNIFICANT GROWTH Performed at [place] [address] [place],  [address]    Report Status 11/06/2021 FINAL  Final  SARS Coronavirus 2 by RT PCR (hospital order, performed in [facility] hospital lab) *cepheid single result test* Anterior Nasal Swab     Status: None   Collection Time: 11/05/21  8:37 AM   Specimen: Anterior Nasal Swab  Result Value Ref Range Status   SARS Coronavirus 2 by RT PCR NEGATIVE NEGATIVE Final    Comment: (NOTE) SARS-CoV-2 target nucleic acids are NOT DETECTED.  The SARS-CoV-2 RNA is generally detectable in upper and lower respiratory specimens  during the acute phase of infection. The lowest concentration of SARS-CoV-2 viral copies this assay can detect is 250 copies / mL. A negative result does not preclude SARS-CoV-2 infection and should not be used as the sole basis for treatment or other patient management decisions.  A negative result may occur with improper specimen collection / handling, submission of specimen other than  nasopharyngeal swab, presence of viral mutation(s) within the areas targeted by this assay, and inadequate number of viral copies (<250 copies / mL). A negative result must be combined with clinical observations, patient history, and epidemiological information.  Fact Sheet for Patients:   https://www.patel.info/  Fact Sheet for Healthcare Providers: https://hall.com/  This test is not yet approved or  cleared by the [place] FDA and has been authorized for detection and/or diagnosis of SARS-CoV-2 by FDA under an Emergency Use Authorization (EUA).  This EUA will remain in effect (meaning this test can be used) for the duration of the COVID-19 declaration under Section 564(b)(1) of the Act, 21 U.S.C. section 360bbb-3(b)(1), unless the authorization is terminated or revoked sooner.  Performed at [facility], [place] [address], [place], [place] [address]   Culture, blood (Routine X 2) w Reflex to ID Panel     Status: None   Collection Time: 11/06/21  4:12 PM   Specimen: BLOOD  Result Value Ref Range Status   Specimen Description   Final    BLOOD RIGHT ANTECUBITAL Performed at [place] [address] [place], [place] [address]    Special Requests   Final    BOTTLES DRAWN AEROBIC ONLY Blood Culture adequate volume Performed at [place] [address], [place], [place] [address]    Culture   Final    NO GROWTH 5 DAYS Performed at [facility], [place] [address] [place], [place]  [address]    Report Status 11/11/2021 FINAL  Final  Culture, blood (Routine X 2) w Reflex to ID Panel     Status: None   Collection Time: 11/06/21  4:12 PM   Specimen: BLOOD  Result Value Ref Range Status   Specimen Description   Final    BLOOD LEFT ANTECUBITAL Performed at [place] [address] [place] [address], [place], [place] [address]    Special Requests   Final    BOTTLES DRAWN AEROBIC ONLY Blood Culture adequate volume Performed at [place] [address], [place], [place] [address]    Culture   Final    NO GROWTH 5 DAYS Performed at [facility], [place] [address] [place], [place] [address]    Report Status 11/11/2021 FINAL  Final  Urine Culture     Status: None   Collection Time: 11/06/21  6:28 PM   Specimen: Urine, Clean Catch  Result Value Ref Range Status   Specimen Description   Final    URINE, CLEAN CATCH Performed at [facility], [place] [address] [place], [place] [address]    Special Requests   Final    NONE Performed at [facility], [place] [address], [place], [place] [address]    Culture   Final    NO GROWTH Performed at [facility], [place] [address] [place], [place] [address]    Report Status 11/08/2021 FINAL  Final         Radiology Studies: CT ABDOMEN PELVIS W CONTRAST  Result Date: 11/11/2021 CLINICAL DATA:  Acute abdominal pain. EXAM: CT ABDOMEN AND PELVIS WITH CONTRAST TECHNIQUE: Multidetector CT imaging of the abdomen and pelvis was performed using the standard protocol following bolus administration of intravenous contrast. RADIATION DOSE REDUCTION: This exam was performed according to the departmental dose-optimization program which includes automated exposure control, adjustment of the mA and/or kV according to patient size and/or use of iterative reconstruction technique. CONTRAST:  163m OMNIPAQUE IOHEXOL 300 MG/ML  SOLN COMPARISON:  PET-CT 10/08/2021.  CT abdomen and  pelvis 07/09/2021. FINDINGS: Lower chest: There are trace bilateral pleural effusions. There is minimal atelectasis in the lung bases. Hepatobiliary: There is a new hypodense liver lesion in the right lobe measuring 4.1 x 3.5 cm. Bile ducts and gallbladder are within normal limits. Pancreas: Unremarkable. No pancreatic ductal dilatation or surrounding inflammatory changes. Spleen: Normal in size without focal abnormality. Adrenals/Urinary Tract: There are new nodular densities in the left nephrectomy bed measuring up to 2.1 x 2.1 cm. There are numerous new nodular densities with low-attenuation center throughout the course of the left ureter extending from the nephrectomy bed. The largest area is at the level of the distal ureter/left pelvic sidewall measuring 4.0 x 5.3 by 7.6 cm. Second largest nodule is seen adjacent to the left psoas muscle image 2/56 measuring 2.2 x 3.0 cm. The adrenal glands are within normal limits. Right renal calculi are present measuring up to 11 mm, unchanged. Right inferior pole cyst measures 2.6 cm, similar to the prior study. There is some mild asymmetric thickening of the posterior bladder wall more prominent on the left measuring up to 5 mm. This appears similar to prior. Air-fluid level is again seen within the bladder. Stomach/Bowel: There is some dilated proximal small bowel loops measuring up to 3.8 cm without definitive transition point. Contrast is seen to the level of the cecum. Large stool burden. Appendix is within normal limits. Vascular/Lymphatic: Aorta and IVC are normal in size. There are atherosclerotic calcifications of the aorta. Reproductive: Prostate gland is prominent in size, unchanged. Other: There are new peripherally enhancing nodular densities in the left paracolic gutter measuring up to 1.9 x 1.9 cm image 2/71. There is new presacral edema. There is no focal abdominal wall hernia. Musculoskeletal: No focal osseous lesions are identified. No acute fractures.  IMPRESSION: 1. Numerous new nodular densities in the left nephrectomy bed and along the expected course of the left ureter/retroperitoneum worrisome for disease recurrence. 2. New nodular densities in the left paracolic gutter worrisome for metastatic disease. 3. Stable mild wall thickening of the posterior bladder. 4. New presacral edema. 5. New 4.5 cm mass in the liver worrisome for metastatic disease. 6. Air in the bladder may be iatrogenic. Correlate clinically for infection. 7. Mildly dilated proximal small bowel loops favored as ileus. Partial small bowel obstruction is not excluded. Contrast is seen to the level of the cecum. 8. Trace bilateral pleural effusions. Electronically Signed   By: ARonney AstersM.D.   On: 11/11/2021 20:33        Scheduled Meds:  ALPRAZolam  1 mg Oral QHS   And   ALPRAZolam  0.5 mg Oral Daily   amiodarone  100 mg Oral BID   apixaban  5 mg Oral BID   Chlorhexidine Gluconate Cloth  6 each Topical Daily   darifenacin  7.5 mg Oral Daily   docusate sodium  100 mg Oral BID   feeding supplement  237 mL Oral QID   gabapentin  600 mg Oral TID   levothyroxine  50 mcg Oral QAC breakfast   multivitamin with minerals  1 tablet Oral Daily   pantoprazole  40 mg Oral BID   pravastatin  40 mg Oral Daily   sodium chloride flush  10-40 mL Intracatheter Q12H   tamsulosin  0.4 mg Oral BID   Continuous Infusions:     LOS: 7 days    Time spent: 39 min  Georgette Shell, MD  11/12/2021, 11:03 AM

## 2021-11-12 NOTE — Progress Notes (Incomplete Revision)
PROGRESS NOTE    Marvin Gill  JJH:[id number] DOB: 1951/01/31 DOA: 11/04/2021 PCP: Lavone Orn, MD   Brief Narrative: 71 year old male with multiple comorbidities admitted with gradually worsening shortness of breath and dyspnea on exertion.  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  He is due to start chemotherapy on 11/09/2021.  However he has been in the hospital.  He reports poor oral intake and significant weight loss of 50 pounds.  He was off Eliquis for 2 weeks prior to admission due to gross hematuria which has been resolved now he is back on his Eliquis.  Assessment & Plan:   Principal Problem:   Dyspnea Active Problems:   Benign localized prostatic hyperplasia with lower urinary tract symptoms (LUTS)   PAF (paroxysmal atrial fibrillation) (HCC)   Bladder cancer (HCC)   COPD with emphysema (HCC)   GERD (gastroesophageal reflux disease)   Hypothyroidism   Dyspnea on exertion   Leukocytosis   Protein-calorie malnutrition, severe   Ventricular tachycardia (HCC)   #1 paroxysmal atrial fibrillation continue Eliquis and amiodarone rate is controlled.  He received 1 dose of digoxin.  #2 V. tach continue amiodarone.  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 Alen Blew.  He was due to start chemo 11/09/2021.  #4 persistent leukocytosis with abdominal pain- CHECK UA  CT ABD 6/14-Numerous new nodular densities in the left nephrectomy bed and along the expected course of the left ureter/retroperitoneum worrisome for disease recurrence. New nodular densities in the left paracolic gutter worrisome for metastatic disease. Stable mild wall thickening of the posterior bladder.New presacral edema. New 4.5 cm mass in the  liver worrisome for metastatic disease. Air in the bladder may be iatrogenic. Correlate clinically for infection. Mildly dilated proximal small bowel loops favored as ileus. Partial small bowel obstruction is not excluded. Contrast is seen to the level of the cecum.  Trace bilateral pleural effusions  #5 dyspnea on exertion likely due to acute diastolic heart failure with grade 1 diastolic dysfunction by echo normal ejection fraction.  CT chest with no evidence of PE.   #6 frequent falls secondary to orthostatic hypotension-patient has been on IV fluids since 11/04/2021.  IV fluids stopped 6/14 I will put him on TED hose  #7 hypothyroidism on Synthroid  #8 multiple electrolyte abnormalities hypokalemia, hypomagnesemia, hypophosphatemia -RESOLVED   Nutrition Problem: Severe Malnutrition Etiology: chronic illness, cancer and cancer related treatments     Signs/Symptoms: percent weight loss, severe muscle depletion, mild fat depletion    Interventions: Ensure Enlive (each supplement provides 350kcal and 20 grams of protein), MVI, Education  Estimated body mass index is 20.53 kg/m as calculated from the following:   Height as of this encounter: '6\' 6"'$  (1.981 m).   Weight as of this encounter: 80.6 kg.  DVT prophylaxis: Eliquis  code Status: Full code Family Communication: None Disposition Plan: Plan is DC home 11/13/2021 with daughter  remains inpatient appropriate because: Hypoxia ongoing abdominal pain and leukocytosis   Consultants:  None  Procedures: None Antimicrobials: None  Subjective: VERY WEAK CT ABD 6/14 AS ABOVE.  Objective: Vitals:   11/12/21 0500 11/12/21 0513 11/12/21 0518 11/12/21 0531  BP:  110/70 98/71 111/83  Pulse:  80 98 92  Resp:    20  Temp:  98.4  F (36.9 C)  98.4 F (36.9 C)  TempSrc:  Oral  Oral  SpO2:  98%    Weight: 80.6 kg     Height:        Intake/Output Summary (Last 24 hours) at 11/12/2021 1103 Last data filed at 11/11/2021  1814 Gross per 24 hour  Intake 637.88 ml  Output 450 ml  Net 187.88 ml    Filed Weights   11/11/21 0500 11/11/21 0930 11/12/21 0500  Weight: 83.8 kg 83.4 kg 80.6 kg    Examination:  General exam: Appears chronically ill looking  respiratory system: Clear to auscultation. Respiratory effort normal. Cardiovascular system: S1 & S2 heard, RRR. No JVD, murmurs, rubs, gallops or clicks. No pedal edema. Gastrointestinal system: Abdomen is nondistended, soft and tender left  anterior side AND SUPRA PUBIC  No organomegaly or masses felt. Normal bowel sounds heard. Central nervous system: Alert and oriented. No focal neurological deficits. Extremities: Trace edema  skin: No rashes, lesions or ulcers Psychiatry: Judgement and insight appear normal. Mood & affect appropriate.     Data Reviewed: I have personally reviewed following labs and imaging studies  CBC: Recent Labs  Lab 11/08/21 0243 11/09/21 0348 11/10/21 0404 11/11/21 0401 11/12/21 0359  WBC 25.9* 22.3* 21.2* 21.3* 22.5*  NEUTROABS 21.6* 18.9* 18.2* 17.7* 18.8*  HGB 9.6* 8.7* 8.5* 8.9* 8.8*  HCT 28.0* 26.0* 25.6* 26.3* 25.9*  MCV 85.1 85.2 85.0 84.6 84.4  PLT 317 335 351 391 580    Basic Metabolic Panel: Recent Labs  Lab 11/08/21 0243 11/09/21 0348 11/10/21 0404 11/11/21 0401 11/12/21 0359  NA 137 139 137 134* 136  K 3.2* 3.6 3.3* 3.7 3.8  CL 110 113* 112* 109 109  CO2 16* 18* 19* 18* 20*  GLUCOSE 107* 107* 107* 107* 98  BUN '17 17 10 9 12  '$ CREATININE 1.04 0.93 0.79 0.88 0.92  CALCIUM 8.4* 8.6* 8.2* 8.1* 8.4*  MG 1.8 1.9 1.7 1.8 1.9  PHOS 2.7 1.9* 2.7 2.5 3.4    GFR: Estimated Creatinine Clearance: 82.7 mL/min (by C-G formula based on SCr of 0.92 mg/dL). Liver Function Tests: Recent Labs  Lab 11/08/21 0243 11/09/21 0348 11/10/21 0404 11/11/21 0401 11/12/21 0359  AST '15 18 15 17 16  '$ ALT '11 14 14 14 14  '$ ALKPHOS 89 81 76 79 76  BILITOT 0.7 0.6 0.7 0.9 1.0  PROT 6.0* 6.2* 5.6* 6.1* 6.2*  ALBUMIN  2.4* 2.8* 2.5* 2.4* 2.4*    No results for input(s): "LIPASE", "AMYLASE" in the last 168 hours. No results for input(s): "AMMONIA" in the last 168 hours. Coagulation Profile: No results for input(s): "INR", "PROTIME" in the last 168 hours. Cardiac Enzymes: No results for input(s): "CKTOTAL", "CKMB", "CKMBINDEX", "TROPONINI" in the last 168 hours. BNP (last 3 results) No results for input(s): "PROBNP" in the last 8760 hours. HbA1C: No results for input(s): "HGBA1C" in the last 72 hours. CBG: No results for input(s): "GLUCAP" in the last 168 hours. Lipid Profile: No results for input(s): "CHOL", "HDL", "LDLCALC", "TRIG", "CHOLHDL", "LDLDIRECT" in the last 72 hours. Thyroid Function Tests: No results for input(s): "TSH", "T4TOTAL", "FREET4", "T3FREE", "THYROIDAB" in the last 72 hours. Anemia Panel: No results for input(s): "VITAMINB12", "FOLATE", "FERRITIN", "TIBC", "IRON", "RETICCTPCT" in the last 72 hours. Sepsis Labs: Recent Labs  Lab 11/06/21 0547 11/06/21 1612 11/06/21 1909 11/06/21 2254  PROCALCITON 0.39  --   --   --   LATICACIDVEN  --  1.1 1.2 0.8     Recent Results (  from the past 240 hour(s))  Group A Strep by PCR     Status: None   Collection Time: 11/04/21  3:07 PM   Specimen: Throat; Sterile Swab  Result Value Ref Range Status   Group A Strep by PCR NOT DETECTED NOT DETECTED Final    Comment: Performed at [facility], [place] [address], [place], [place] [address]  Blood culture (routine x 2)     Status: None   Collection Time: 11/04/21  3:42 PM   Specimen: BLOOD RIGHT HAND  Result Value Ref Range Status   Specimen Description   Final    BLOOD RIGHT HAND BLOOD Performed at [place] [address] [place], [place] [address]    Special Requests   Final    Blood Culture results may not be optimal due to an inadequate volume of blood received in culture bottles BOTTLES DRAWN AEROBIC AND ANAEROBIC Performed at [facility], [place] [address], [place], [place] [address]    Culture   Final    NO GROWTH 5 DAYS Performed at [facility], [place] [address], [place], [place] [address]    Report Status 11/09/2021 FINAL  Final  Blood culture (routine x 2)     Status: None   Collection Time: 11/04/21  4:00 PM   Specimen: BLOOD RIGHT ARM  Result Value Ref Range Status   Specimen Description   Final    BLOOD RIGHT ARM BLOOD Performed at [place] [address], [place], [place] [address]    Special Requests   Final    Blood Culture adequate volume BOTTLES DRAWN AEROBIC AND ANAEROBIC Performed at [place] [address] [place], [place] [address]    Culture   Final    NO GROWTH 5 DAYS Performed at [facility], [place] [address], [place], [place] [address]    Report Status 11/09/2021 FINAL  Final  Urine Culture     Status: Abnormal   Collection Time: 11/04/21 10:01 PM   Specimen: Urine, Clean Catch  Result Value Ref Range Status   Specimen Description   Final    URINE, CLEAN CATCH Performed at [facility], [place] [address], [place], [place] [address]    Special Requests   Final    NONE Performed at [facility], [place] [address], [place], [place] [address]    Culture (A)  Final    <10,000 COLONIES/mL INSIGNIFICANT GROWTH Performed at [place] [address] [place], [place] [address]    Report Status 11/06/2021 FINAL  Final  SARS Coronavirus 2 by RT PCR (hospital order, performed in [facility] hospital lab) *cepheid single result test* Anterior Nasal Swab     Status: None   Collection Time: 11/05/21  8:37 AM   Specimen: Anterior Nasal Swab  Result Value Ref Range Status   SARS Coronavirus 2 by RT PCR NEGATIVE NEGATIVE Final    Comment: (NOTE) SARS-CoV-2 target nucleic acids are NOT DETECTED.  The SARS-CoV-2 RNA is generally detectable in upper and lower respiratory specimens  during the acute phase of infection. The lowest concentration of SARS-CoV-2 viral copies this assay can detect is 250 copies / mL. A negative result does not preclude SARS-CoV-2 infection and should not be used as the sole basis for treatment or other patient management decisions.  A negative result may occur with improper specimen collection / handling, submission of specimen other than  nasopharyngeal swab, presence of viral mutation(s) within the areas targeted by this assay, and inadequate number of viral copies (<250 copies / mL). A negative result must be combined with clinical observations, patient history, and epidemiological information.  Fact Sheet for Patients:   https://www.patel.info/  Fact Sheet for Healthcare Providers: https://hall.com/  This test is not yet approved or  cleared by the [place] FDA and has been authorized for detection and/or diagnosis of SARS-CoV-2 by FDA under an Emergency Use Authorization (EUA).  This EUA will remain in effect (meaning this test can be used) for the duration of the COVID-19 declaration under Section 564(b)(1) of the Act, 21 U.S.C. section 360bbb-3(b)(1), unless the authorization is terminated or revoked sooner.  Performed at [facility], [place] [address] [place], [place] [address]   Culture, blood (Routine X 2) w Reflex to ID Panel     Status: None   Collection Time: 11/06/21  4:12 PM   Specimen: BLOOD  Result Value Ref Range Status   Specimen Description   Final    BLOOD RIGHT ANTECUBITAL Performed at [place] [address], [place], [place] [address]    Special Requests   Final    BOTTLES DRAWN AEROBIC ONLY Blood Culture adequate volume Performed at [place] [address] [place], [place] [address]    Culture   Final    NO GROWTH 5 DAYS Performed at [facility], [place] [address], [place], [place]  [address]    Report Status 11/11/2021 FINAL  Final  Culture, blood (Routine X 2) w Reflex to ID Panel     Status: None   Collection Time: 11/06/21  4:12 PM   Specimen: BLOOD  Result Value Ref Range Status   Specimen Description   Final    BLOOD LEFT ANTECUBITAL Performed at [place] [address] [place], [place] [address]    Special Requests   Final    BOTTLES DRAWN AEROBIC ONLY Blood Culture adequate volume Performed at [place] [address], [place], [place] [address]    Culture   Final    NO GROWTH 5 DAYS Performed at [facility], [place] [address], [place], [place] [address]    Report Status 11/11/2021 FINAL  Final  Urine Culture     Status: None   Collection Time: 11/06/21  6:28 PM   Specimen: Urine, Clean Catch  Result Value Ref Range Status   Specimen Description   Final    URINE, CLEAN CATCH Performed at [facility], [place] [address], [place], [place] [address]    Special Requests   Final    NONE Performed at [facility], [place] [address], [place], [place] [address]    Culture   Final    NO GROWTH Performed at [facility], [place] [address] [place], [place] [address]    Report Status 11/08/2021 FINAL  Final         Radiology Studies: CT ABDOMEN PELVIS W CONTRAST  Result Date: 11/11/2021 CLINICAL DATA:  Acute abdominal pain. EXAM: CT ABDOMEN AND PELVIS WITH CONTRAST TECHNIQUE: Multidetector CT imaging of the abdomen and pelvis was performed using the standard protocol following bolus administration of intravenous contrast. RADIATION DOSE REDUCTION: This exam was performed according to the departmental dose-optimization program which includes automated exposure control, adjustment of the mA and/or kV according to patient size and/or use of iterative reconstruction technique. CONTRAST:  178m OMNIPAQUE IOHEXOL 300 MG/ML  SOLN COMPARISON:  PET-CT 10/08/2021.  CT abdomen and  pelvis 07/09/2021. FINDINGS: Lower chest: There are trace bilateral pleural effusions. There is minimal atelectasis in the lung bases. Hepatobiliary: There is a new hypodense liver lesion in the right lobe measuring 4.1 x 3.5 cm. Bile ducts and gallbladder are within normal limits. Pancreas: Unremarkable. No pancreatic ductal dilatation or surrounding inflammatory changes. Spleen: Normal in size without focal abnormality. Adrenals/Urinary Tract: There are new nodular densities in the left nephrectomy bed measuring up to 2.1 x 2.1 cm. There are numerous new nodular densities with low-attenuation center throughout the course of the left ureter extending from the nephrectomy bed. The largest area is at the level of the distal ureter/left pelvic sidewall measuring 4.0 x 5.3 by 7.6 cm. Second largest nodule is seen adjacent to the left psoas muscle image 2/56 measuring 2.2 x 3.0 cm. The adrenal glands are within normal limits. Right renal calculi are present measuring up to 11 mm, unchanged. Right inferior pole cyst measures 2.6 cm, similar to the prior study. There is some mild asymmetric thickening of the posterior bladder wall more prominent on the left measuring up to 5 mm. This appears similar to prior. Air-fluid level is again seen within the bladder. Stomach/Bowel: There is some dilated proximal small bowel loops measuring up to 3.8 cm without definitive transition point. Contrast is seen to the level of the cecum. Large stool burden. Appendix is within normal limits. Vascular/Lymphatic: Aorta and IVC are normal in size. There are atherosclerotic calcifications of the aorta. Reproductive: Prostate gland is prominent in size, unchanged. Other: There are new peripherally enhancing nodular densities in the left paracolic gutter measuring up to 1.9 x 1.9 cm image 2/71. There is new presacral edema. There is no focal abdominal wall hernia. Musculoskeletal: No focal osseous lesions are identified. No acute fractures.  IMPRESSION: 1. Numerous new nodular densities in the left nephrectomy bed and along the expected course of the left ureter/retroperitoneum worrisome for disease recurrence. 2. New nodular densities in the left paracolic gutter worrisome for metastatic disease. 3. Stable mild wall thickening of the posterior bladder. 4. New presacral edema. 5. New 4.5 cm mass in the liver worrisome for metastatic disease. 6. Air in the bladder may be iatrogenic. Correlate clinically for infection. 7. Mildly dilated proximal small bowel loops favored as ileus. Partial small bowel obstruction is not excluded. Contrast is seen to the level of the cecum. 8. Trace bilateral pleural effusions. Electronically Signed   By: ARonney AstersM.D.   On: 11/11/2021 20:33        Scheduled Meds:  ALPRAZolam  1 mg Oral QHS   And   ALPRAZolam  0.5 mg Oral Daily   amiodarone  100 mg Oral BID   apixaban  5 mg Oral BID   Chlorhexidine Gluconate Cloth  6 each Topical Daily   darifenacin  7.5 mg Oral Daily   docusate sodium  100 mg Oral BID   feeding supplement  237 mL Oral QID   gabapentin  600 mg Oral TID   levothyroxine  50 mcg Oral QAC breakfast   multivitamin with minerals  1 tablet Oral Daily   pantoprazole  40 mg Oral BID   pravastatin  40 mg Oral Daily   sodium chloride flush  10-40 mL Intracatheter Q12H   tamsulosin  0.4 mg Oral BID   Continuous Infusions:     LOS: 7 days    Time spent: 39 min  Georgette Shell, MD  11/12/2021, 11:03 AM

## 2021-11-12 NOTE — Progress Notes (Signed)
DISCONTINUE ON PATHWAY REGIMEN - Bladder     A cycle is every 21 days:     Gemcitabine      Cisplatin   **Always confirm dose/schedule in your pharmacy ordering system**  REASON: Other Reason PRIOR TREATMENT: [id number]: Gemcitabine 1,000 mg/m2 D1, 8 + Cisplatin 70 mg/m2 D1 q21 Days for a Maximum of 6 Cycles TREATMENT RESPONSE: Unable to Evaluate  START ON PATHWAY REGIMEN - Bladder     A cycle is every 21 days:     Carboplatin      Gemcitabine   **Always confirm dose/schedule in your pharmacy ordering system**  Patient Characteristics: Advanced/Metastatic Disease, First Line, No Prior Platinum-Based Therapy, Poor Renal Function (CrCl < 60 mL/min), Unknown PD-L1 Expression Therapeutic Status: Advanced/Metastatic Disease Line of Therapy: First Line Prior Platinum-Based Therapy<= No Renal Function: Poor Renal Function (CrCl < 60 mL/min) PD-L1 Expression Status: Unknown PD-L1 Expression Intent of Therapy: Non-Curative / Palliative Intent, Discussed with Patient

## 2021-11-13 DIAGNOSIS — R0600 Dyspnea, unspecified: Secondary | ICD-10-CM | POA: Diagnosis not present

## 2021-11-13 LAB — COMPREHENSIVE METABOLIC PANEL
ALT: 18 U/L (ref 0–44)
AST: 21 U/L (ref 15–41)
Albumin: 2.5 g/dL — ABNORMAL LOW (ref 3.5–5.0)
Alkaline Phosphatase: 80 U/L (ref 38–126)
Anion gap: 9 (ref 5–15)
BUN: 15 mg/dL (ref 8–23)
CO2: 20 mmol/L — ABNORMAL LOW (ref 22–32)
Calcium: 8.3 mg/dL — ABNORMAL LOW (ref 8.9–10.3)
Chloride: 105 mmol/L (ref 98–111)
Creatinine, Ser: 0.86 mg/dL (ref 0.61–1.24)
GFR, Estimated: >60 mL/min (ref >60)
Glucose, Bld: 106 mg/dL — ABNORMAL HIGH (ref 70–99)
Potassium: 3.6 mmol/L (ref 3.5–5.1)
Sodium: 134 mmol/L — ABNORMAL LOW (ref 135–145)
Total Bilirubin: 0.8 mg/dL (ref 0.3–1.2)
Total Protein: 6.2 g/dL — ABNORMAL LOW (ref 6.5–8.1)

## 2021-11-13 LAB — CBC WITH DIFFERENTIAL/PLATELET
Abs Immature Granulocytes: 0.32 10*3/uL — ABNORMAL HIGH (ref 0.00–0.07)
Basophils Absolute: 0.1 10*3/uL (ref 0.0–0.1)
Basophils Relative: 0 %
Eosinophils Absolute: 0.1 10*3/uL (ref 0.0–0.5)
Eosinophils Relative: 1 %
HCT: 25.7 % — ABNORMAL LOW (ref 39.0–52.0)
Hemoglobin: 8.5 g/dL — ABNORMAL LOW (ref 13.0–17.0)
Immature Granulocytes: 1 %
Lymphocytes Relative: 8 %
Lymphs Abs: 1.8 10*3/uL (ref 0.7–4.0)
MCH: 28.3 pg (ref 26.0–34.0)
MCHC: 33.1 g/dL (ref 30.0–36.0)
MCV: 85.7 fL (ref 80.0–100.0)
Monocytes Absolute: 1.3 10*3/uL — ABNORMAL HIGH (ref 0.1–1.0)
Monocytes Relative: 6 %
Neutro Abs: 18.5 10*3/uL — ABNORMAL HIGH (ref 1.7–7.7)
Neutrophils Relative %: 84 %
Platelets: 391 10*3/uL (ref 150–400)
RBC: 3 MIL/uL — ABNORMAL LOW (ref 4.22–5.81)
RDW: 14.6 % (ref 11.5–15.5)
WBC: 22.1 10*3/uL — ABNORMAL HIGH (ref 4.0–10.5)
nRBC: 0 % (ref 0.0–0.2)

## 2021-11-13 LAB — PHOSPHORUS: Phosphorus: 3.3 mg/dL (ref 2.5–4.6)

## 2021-11-13 LAB — MAGNESIUM: Magnesium: 1.9 mg/dL (ref 1.7–2.4)

## 2021-11-13 MED ORDER — ADULT MULTIVITAMIN W/MINERALS CH: 1.0000 | ORAL_TABLET | Freq: Every day | ORAL | Status: AC

## 2021-11-13 MED ORDER — PROCHLORPERAZINE MALEATE 10 MG PO TABS: 10.0000 mg | ORAL_TABLET | Freq: Four times a day (QID) | ORAL | 0 refills | Status: AC | PRN

## 2021-11-13 MED ORDER — AMIODARONE HCL 100 MG PO TABS: 100.0000 mg | ORAL_TABLET | Freq: Two times a day (BID) | ORAL | 2 refills | Status: AC

## 2021-11-13 MED ORDER — DOCUSATE SODIUM 100 MG PO CAPS: 100.0000 mg | ORAL_CAPSULE | Freq: Two times a day (BID) | ORAL | 0 refills | Status: AC

## 2021-11-13 MED ORDER — HEPARIN SOD (PORK) LOCK FLUSH 100 UNIT/ML IV SOLN
500.0000 [IU] | INTRAVENOUS | Status: AC | PRN
  Administered 2021-11-13: 500 [IU]

## 2021-11-13 MED ORDER — ALPRAZOLAM 0.5 MG PO TABS: 0.5000 mg | ORAL_TABLET | Freq: Every day | ORAL | 0 refills | Status: AC

## 2021-11-13 MED ORDER — TRAMADOL HCL 50 MG PO TABS: 50.0000 mg | ORAL_TABLET | Freq: Four times a day (QID) | ORAL | 0 refills | Status: AC | PRN

## 2021-11-13 MED ORDER — ONDANSETRON HCL 4 MG PO TABS: 4.0000 mg | ORAL_TABLET | Freq: Four times a day (QID) | ORAL | 0 refills | Status: AC | PRN

## 2021-11-13 MED ORDER — ALPRAZOLAM 1 MG PO TABS: 1.0000 mg | ORAL_TABLET | Freq: Every day | ORAL | 0 refills | Status: AC

## 2021-11-13 MED FILL — Fosaprepitant Dimeglumine For IV Infusion 150 MG (Base Eq): INTRAVENOUS | Qty: 5 | Status: AC

## 2021-11-13 MED FILL — Dexamethasone Sodium Phosphate Inj 100 MG/10ML: INTRAMUSCULAR | Qty: 1 | Status: AC

## 2021-11-13 NOTE — Discharge Summary (Signed)
Physician Discharge Summary  Marvin Gill EQA:[id number] DOB: 02/03/1951 DOA: 11/04/2021  PCP: Lavone Orn, MD  Admit date: 11/04/2021 Discharge date: 11/13/2021  Admitted From: Home Disposition: Home  Recommendations for Outpatient Follow-up:  Follow up with PCP in 1-2 weeks Please obtain BMP/CBC in one week Please follow up with dr Alen Blew  Home Health:yes Equipment/Devices:none Discharge Condition:stable CODE STATUS:full Diet recommendation: cardiac Brief/Interim Summary: 71 year old male with multiple comorbidities admitted with gradually worsening shortness of breath and dyspnea on exertion.  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  He is due to start chemotherapy on 11/09/2021.  However he has been in the hospital.  He reports poor oral intake and significant weight loss of 50 pounds.  He was off Eliquis for 2 weeks prior to admission due to gross hematuria which has been resolved now he is back on his Eliquis.  Discharge Diagnoses:  Principal Problem:   Dyspnea Active Problems:   Benign localized prostatic hyperplasia with lower urinary tract symptoms (LUTS)   PAF (paroxysmal atrial fibrillation) (HCC)   Bladder cancer (HCC)   COPD with emphysema (HCC)   GERD (gastroesophageal reflux disease)   Hypothyroidism   Dyspnea on exertion   Leukocytosis   Protein-calorie malnutrition, severe   Ventricular tachycardia (HCC)    #1 paroxysmal atrial fibrillation continue Eliquis and amiodarone rate is controlled.  He received 1 dose of digoxin.   #2 V. tach continue amiodarone.   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 Alen Blew.  He was due to start chemo 11/09/2021. Follow-up with Dr. Alen Blew on 11/16/2021.   #4  persistent leukocytosis with abdominal pain- No infectious source was identified. A repeat CT ABD 6/14-Numerous new nodular densities in the left nephrectomy bed and along the expected course of the left ureter/retroperitoneum worrisome for disease recurrence. New nodular densities in the left paracolic gutter worrisome for metastatic disease. Stable mild wall thickening of the posterior bladder.New presacral edema. New 4.5 cm mass in the liver worrisome for metastatic disease. Air in the bladder may be iatrogenic. Correlate clinically for infection. Mildly dilated proximal small bowel loops favored as ileus. Partial small bowel obstruction is not excluded. Contrast is seen to the level of the cecum.  Trace bilateral pleural effusions Discussed with Dr. Alen Blew who will see the patient on 11/16/2021 for further chemotherapy for palliative reasons   #5 dyspnea on exertion likely due to acute diastolic heart failure with grade 1 diastolic dysfunction by echo normal ejection fraction.  CT chest with no evidence of PE.    #6 frequent falls secondary to orthostatic hypotension-status treated with IV fluids and TED hose.    #7 hypothyroidism on Synthroid   #8 multiple electrolyte abnormalities hypokalemia, hypomagnesemia, hypophosphatemia -RESOLVED     Nutrition Problem: Severe Malnutrition Etiology: chronic illness, cancer and cancer related treatments    Signs/Symptoms: percent weight loss, severe muscle depletion, mild fat depletion     Interventions: Ensure Enlive (each supplement provides 350kcal and 20 grams of protein), MVI, Education  Estimated body mass index is 20.41 kg/m as calculated from the following:   Height as of this encounter: '6\' 6"'$  (1.981 m).   Weight as of this encounter: 80.1 kg.  Discharge Instructions  Discharge Instructions     Diet - low sodium heart healthy  Complete by: As directed    Increase activity slowly   Complete by: As directed        Allergies as of 11/13/2021       Reactions   Codeine Nausea And Vomiting   "made my head feel funny too"   12 Hour Decongestant [nasal Spray]    Other reaction(s): Unknown   Duloxetine Hcl Nausea And Vomiting   Oxycodone-acetaminophen Other (See Comments)   Hallucinations   Pregabalin    suicidal thoughts with lyrica   Vicodin Hp [hydrocodone-acetaminophen] Rash        Medication List     TAKE these medications    acetaminophen 650 MG CR tablet Commonly known as: TYLENOL Take 1,300 mg by mouth every 8 (eight) hours as needed for pain.   ALPRAZolam 1 MG tablet Commonly known as: XANAX Take 1 tablet (1 mg total) by mouth at bedtime. What changed:  how much to take when to take this additional instructions   ALPRAZolam 0.5 MG tablet Commonly known as: XANAX Take 1 tablet (0.5 mg total) by mouth daily. What changed: You were already taking a medication with the same name, and this prescription was added. Make sure you understand how and when to take each.   amiodarone 100 MG tablet Commonly known as: PACERONE Take 1 tablet (100 mg total) by mouth 2 (two) times daily.   apixaban 5 MG Tabs tablet Commonly known as: ELIQUIS Take 5 mg by mouth 2 (two) times daily.   docusate sodium 100 MG capsule Commonly known as: COLACE Take 1 capsule (100 mg total) by mouth 2 (two) times daily.   esomeprazole 40 MG capsule Commonly known as: NEXIUM Take 40 mg by mouth daily.   gabapentin 800 MG tablet Commonly known as: NEURONTIN Take 800 mg by mouth 3 (three) times daily.   levothyroxine 50 MCG tablet Commonly known as: SYNTHROID Take 1 tablet (50 mcg total) by mouth daily before breakfast.   lidocaine-prilocaine cream Commonly known as: EMLA Apply 1 application. topically as needed. What changed:  when to take this reasons to take this   megestrol 40 MG/ML suspension Commonly known as: MEGACE SHAKE LIQUID AND TAKE 10 ML(400 MG) BY MOUTH DAILY   multivitamin  with minerals Tabs tablet Take 1 tablet by mouth daily.   nitroGLYCERIN 0.4 MG SL tablet Commonly known as: NITROSTAT Place 1 tablet (0.4 mg total) under the tongue every 5 (five) minutes as needed for chest pain.   ondansetron 4 MG tablet Commonly known as: ZOFRAN Take 1 tablet (4 mg total) by mouth every 6 (six) hours as needed for nausea.   phenazopyridine 200 MG tablet Commonly known as: Pyridium Take 1 tablet (200 mg total) by mouth 3 (three) times daily as needed (for pain with urination).   polyethylene glycol 17 g packet Commonly known as: MIRALAX / GLYCOLAX Take 17 g by mouth daily as needed for moderate constipation.   pravastatin 40 MG tablet Commonly known as: PRAVACHOL Take 40 mg by mouth in the morning.   prochlorperazine 10 MG tablet Commonly known as: COMPAZINE Take 1 tablet (10 mg total) by mouth every 6 (six) hours as needed for nausea or vomiting.   sodium chloride 0.65 % nasal spray Commonly known as: OCEAN Place 1 spray into the nose at bedtime as needed for congestion.   solifenacin 10 MG tablet Commonly known as: VESICARE Take 10 mg by mouth daily.   tamsulosin 0.4 MG Caps capsule Commonly known as: FLOMAX Take 0.4 mg  by mouth 2 (two) times daily.   traMADol 50 MG tablet Commonly known as: ULTRAM Take 1 tablet (50 mg total) by mouth every 6 (six) hours as needed for severe pain.   valACYclovir 1000 MG tablet Commonly known as: VALTREX Take 1,000 mg by mouth daily as needed (fever blister).        Follow-up Information     Lavone Orn, MD Follow up.   Specialty: Internal Medicine Contact information: [address] [place] [address] [phone]         Fay Records, MD .   Specialty: Cardiology Contact information: [place] [address] [phone]         Wyatt Portela, MD Follow up.   Specialty: Oncology Why: You have appointment to see Dr. Alen Blew on  11/16/2021 Contact information: [place] [address] [phone]                Allergies  Allergen Reactions   Codeine Nausea And Vomiting    "made my head feel funny too"   12 Hour Decongestant [Nasal Spray]     Other reaction(s): Unknown   Duloxetine Hcl Nausea And Vomiting   Oxycodone-Acetaminophen Other (See Comments)    Hallucinations   Pregabalin     suicidal thoughts with lyrica   Vicodin Hp [Hydrocodone-Acetaminophen] Rash    Consultations: Oncology   Procedures/Studies: CT ABDOMEN PELVIS W CONTRAST  Result Date: 11/11/2021 CLINICAL DATA:  Acute abdominal pain. EXAM: CT ABDOMEN AND PELVIS WITH CONTRAST TECHNIQUE: Multidetector CT imaging of the abdomen and pelvis was performed using the standard protocol following bolus administration of intravenous contrast. RADIATION DOSE REDUCTION: This exam was performed according to the departmental dose-optimization program which includes automated exposure control, adjustment of the mA and/or kV according to patient size and/or use of iterative reconstruction technique. CONTRAST:  152m OMNIPAQUE IOHEXOL 300 MG/ML  SOLN COMPARISON:  PET-CT 10/08/2021.  CT abdomen and pelvis 07/09/2021. FINDINGS: Lower chest: There are trace bilateral pleural effusions. There is minimal atelectasis in the lung bases. Hepatobiliary: There is a new hypodense liver lesion in the right lobe measuring 4.1 x 3.5 cm. Bile ducts and gallbladder are within normal limits. Pancreas: Unremarkable. No pancreatic ductal dilatation or surrounding inflammatory changes. Spleen: Normal in size without focal abnormality. Adrenals/Urinary Tract: There are new nodular densities in the left nephrectomy bed measuring up to 2.1 x 2.1 cm. There are numerous new nodular densities with low-attenuation center throughout the course of the left ureter extending from the nephrectomy bed. The largest area is at the level of the distal ureter/left pelvic  sidewall measuring 4.0 x 5.3 by 7.6 cm. Second largest nodule is seen adjacent to the left psoas muscle image 2/56 measuring 2.2 x 3.0 cm. The adrenal glands are within normal limits. Right renal calculi are present measuring up to 11 mm, unchanged. Right inferior pole cyst measures 2.6 cm, similar to the prior study. There is some mild asymmetric thickening of the posterior bladder wall more prominent on the left measuring up to 5 mm. This appears similar to prior. Air-fluid level is again seen within the bladder. Stomach/Bowel: There is some dilated proximal small bowel loops measuring up to 3.8 cm without definitive transition point. Contrast is seen to the level of the cecum. Large stool burden. Appendix is within normal limits. Vascular/Lymphatic: Aorta and IVC are normal in size. There are atherosclerotic calcifications of the aorta. Reproductive: Prostate gland is prominent in  size, unchanged. Other: There are new peripherally enhancing nodular densities in the left paracolic gutter measuring up to 1.9 x 1.9 cm image 2/71. There is new presacral edema. There is no focal abdominal wall hernia. Musculoskeletal: No focal osseous lesions are identified. No acute fractures. IMPRESSION: 1. Numerous new nodular densities in the left nephrectomy bed and along the expected course of the left ureter/retroperitoneum worrisome for disease recurrence. 2. New nodular densities in the left paracolic gutter worrisome for metastatic disease. 3. Stable mild wall thickening of the posterior bladder. 4. New presacral edema. 5. New 4.5 cm mass in the liver worrisome for metastatic disease. 6. Air in the bladder may be iatrogenic. Correlate clinically for infection. 7. Mildly dilated proximal small bowel loops favored as ileus. Partial small bowel obstruction is not excluded. Contrast is seen to the level of the cecum. 8. Trace bilateral pleural effusions. Electronically Signed   By: Ronney Asters M.D.   On: 11/11/2021 20:33    DG CHEST PORT 1 VIEW  Result Date: 11/06/2021 CLINICAL DATA:  Shortness of breath EXAM: PORTABLE CHEST 1 VIEW COMPARISON:  Portable exam 1529 hours compared to 11/04/2021 FINDINGS: RIGHT jugular Port-A-Cath with tip projecting over SVC. Normal heart size, mediastinal contours, and pulmonary vascularity. Lungs clear. No infiltrate, pleural effusion, or pneumothorax. Osseous structures unremarkable. IMPRESSION: No acute abnormalities. Electronically Signed   By: Lavonia Dana M.D.   On: 11/06/2021 15:35   ECHOCARDIOGRAM COMPLETE  Result Date: 11/05/2021    ECHOCARDIOGRAM REPORT   Patient Name:   Marvin Gill [facility] Date of Exam: 11/05/2021 Medical Rec #:  [id number]        Height:       78.0 in Accession #:    [id number]       Weight:       180.0 lb Date of Birth:  1951/02/14        BSA:          2.159 m Patient Age:    71 years         BP:           139/78 mmHg Patient Gender: M                HR:           89 bpm. Exam Location:  Inpatient Procedure: 2D Echo, Cardiac Doppler and Color Doppler Indications:    Dyspnea  History:        Patient has prior history of Echocardiogram examinations, most                 recent 03/06/2018. CAD, COPD; Arrythmias:Atrial Fibrillation.  Sonographer:    Joette Catching RCS Referring Phys: [phone] [facility]  Sonographer Comments: Technically challenging study due to limited acoustic windows. Image acquisition challenging due to uncooperative patient and Image acquisition challenging due to respiratory motion. IMPRESSIONS  1. Left ventricular ejection fraction, by estimation, is 55 to 60%. The left ventricle has normal function. The left ventricle has no regional wall motion abnormalities. Left ventricular diastolic parameters are consistent with Grade I diastolic dysfunction (impaired relaxation).  2. Right ventricular systolic function is normal. The right ventricular size is normal. There is normal pulmonary artery systolic pressure.  3. The pericardial effusion is anterior to  the right ventricle. There is no evidence of cardiac tamponade.  4. The mitral valve is normal in structure. Trivial mitral valve regurgitation. No evidence of mitral stenosis.  5. The noncoronary cusp is highly calcified and partially fused  to the right coronary cusp. The aortic valve is tricuspid. Aortic valve regurgitation is not visualized. Aortic valve sclerosis/calcification is present, without any evidence of aortic stenosis. Aortic valve area, by VTI measures 1.99 cm. Aortic valve mean gradient measures 8.0 mmHg. Aortic valve Vmax measures 1.86 m/s.  6. The inferior vena cava is normal in size with greater than 50% respiratory variability, suggesting right atrial pressure of 3 mmHg. FINDINGS  Left Ventricle: Left ventricular ejection fraction, by estimation, is 55 to 60%. The left ventricle has normal function. The left ventricle has no regional wall motion abnormalities. The left ventricular internal cavity size was normal in size. There is  no left ventricular hypertrophy. Left ventricular diastolic parameters are consistent with Grade I diastolic dysfunction (impaired relaxation). Right Ventricle: The right ventricular size is normal. No increase in right ventricular wall thickness. Right ventricular systolic function is normal. There is normal pulmonary artery systolic pressure. The tricuspid regurgitant velocity is 2.84 m/s, and  with an assumed right atrial pressure of 3 mmHg, the estimated right ventricular systolic pressure is 16.1 mmHg. Left Atrium: Left atrial size was normal in size. Right Atrium: Right atrial size was normal in size. Pericardium: Trivial pericardial effusion is present. The pericardial effusion is anterior to the right ventricle. There is no evidence of cardiac tamponade. Mitral Valve: The mitral valve is normal in structure. Trivial mitral valve regurgitation. No evidence of mitral valve stenosis. Tricuspid Valve: The tricuspid valve is normal in structure. Tricuspid valve  regurgitation is mild . No evidence of tricuspid stenosis. Aortic Valve: The noncoronary cusp is highly calcified and partially fused to the right coronary cusp. The aortic valve is tricuspid. Aortic valve regurgitation is not visualized. Aortic valve sclerosis/calcification is present, without any evidence of aortic stenosis. Aortic valve mean gradient measures 8.0 mmHg. Aortic valve peak gradient measures 13.8 mmHg. Aortic valve area, by VTI measures 1.99 cm. Pulmonic Valve: The pulmonic valve was normal in structure. Pulmonic valve regurgitation is trivial. No evidence of pulmonic stenosis. Aorta: The aortic root is normal in size and structure. Venous: The inferior vena cava is normal in size with greater than 50% respiratory variability, suggesting right atrial pressure of 3 mmHg. IAS/Shunts: No atrial level shunt detected by color flow Doppler.  LEFT VENTRICLE PLAX 2D LVIDd:         4.30 cm     Diastology LVIDs:         2.70 cm     LV e' medial:    7.72 cm/s LV PW:         1.00 cm     LV E/e' medial:  9.7 LV IVS:        0.90 cm     LV e' lateral:   12.60 cm/s LVOT diam:     2.10 cm     LV E/e' lateral: 5.9 LV SV:         67 LV SV Index:   31 LVOT Area:     3.46 cm  LV Volumes (MOD) LV vol d, MOD A2C: 51.8 ml LV vol d, MOD A4C: 48.1 ml LV vol s, MOD A2C: 23.1 ml LV vol s, MOD A4C: 20.0 ml LV SV MOD A2C:     28.7 ml LV SV MOD A4C:     48.1 ml LV SV MOD BP:      29.3 ml RIGHT VENTRICLE             IVC RV Basal diam:  4.20 cm  IVC diam: 1.60 cm RV Mid diam:    2.80 cm RV S prime:     22.80 cm/s TAPSE (M-mode): 1.7 cm LEFT ATRIUM             Index        RIGHT ATRIUM           Index LA diam:        3.50 cm 1.62 cm/m   RA Area:     19.00 cm LA Vol (A2C):   46.0 ml 21.31 ml/m  RA Volume:   52.10 ml  24.14 ml/m LA Vol (A4C):   48.1 ml 22.28 ml/m LA Biplane Vol: 48.4 ml 22.42 ml/m  AORTIC VALVE                     PULMONIC VALVE AV Area (Vmax):    2.14 cm      PV Vmax:       0.94 m/s AV Area (Vmean):   2.16  cm      PV Peak grad:  3.5 mmHg AV Area (VTI):     1.99 cm AV Vmax:           186.00 cm/s AV Vmean:          134.000 cm/s AV VTI:            0.338 m AV Peak Grad:      13.8 mmHg AV Mean Grad:      8.0 mmHg LVOT Vmax:         115.00 cm/s LVOT Vmean:        83.600 cm/s LVOT VTI:          0.194 m LVOT/AV VTI ratio: 0.57  AORTA Ao Root diam: 3.10 cm Ao Asc diam:  3.00 cm MITRAL VALVE               TRICUSPID VALVE MV Area (PHT): 5.38 cm    TR Peak grad:   32.3 mmHg MV Decel Time: 141 msec    TR Vmax:        284.00 cm/s MV E velocity: 74.70 cm/s MV A velocity: 93.60 cm/s  SHUNTS MV E/A ratio:  0.80        Systemic VTI:  0.19 m                            Systemic Diam: 2.10 cm Skeet Latch MD Electronically signed by Skeet Latch MD Signature Date/Time: 11/05/2021/3:11:58 PM    Final    CT Angio Chest PE W and/or Wo Contrast  Result Date: 11/04/2021 CLINICAL DATA:  High probability for PE. EXAM: CT ANGIOGRAPHY CHEST WITH CONTRAST TECHNIQUE: Multidetector CT imaging of the chest was performed using the standard protocol during bolus administration of intravenous contrast. Multiplanar CT image reconstructions and MIPs were obtained to evaluate the vascular anatomy. RADIATION DOSE REDUCTION: This exam was performed according to the departmental dose-optimization program which includes automated exposure control, adjustment of the mA and/or kV according to patient size and/or use of iterative reconstruction technique. CONTRAST:  97m OMNIPAQUE IOHEXOL 350 MG/ML SOLN COMPARISON:  CT angiogram chest abdomen and pelvis 04/21/2018 FINDINGS: Cardiovascular: Satisfactory opacification of the pulmonary arteries to the segmental level. No evidence of pulmonary embolism. Normal heart size. No pericardial effusion. Right chest port catheter tip ends in the right atrium. Mediastinum/Nodes: No enlarged mediastinal, hilar, or axillary lymph nodes. Thyroid gland, trachea, and esophagus demonstrate no significant findings.  Lungs/Pleura: There is a new focal parenchymal area of opacity abutting the pleural surface in the medial left lung apex. This area measures 1.6 x 0.9 by 1.8 cm. Otherwise, biapical scarring and pleural calcifications are unchanged. There are mild emphysematous changes are present. No pleural effusion or pneumothorax. Upper Abdomen: No acute abnormality. Musculoskeletal: No chest wall abnormality. No acute or significant osseous findings. Review of the MIP images confirms the above findings. IMPRESSION: 1. No evidence for pulmonary embolism. 2. New focal parenchymal opacity in the medial left lung apex measuring 1.6 x 0.9 x 1.8 cm. Findings are indeterminate and may represent infectious/inflammatory process Consider one of the following in 3 months for both low-risk and high-risk individuals: (a) repeat chest CT, (b) follow-up PET-CT, or (c) tissue sampling. This recommendation follows the consensus statement: Guidelines for Management of Incidental Pulmonary Nodules Detected on CT Images: From the Fleischner Society 2017; Radiology 2017; 284:228-243. or neoplasm. Emphysema (ICD10-J43.9). Electronically Signed   By: Ronney Asters M.D.   On: 11/04/2021 19:20   CT Soft Tissue Neck Wo Contrast  Result Date: 11/04/2021 CLINICAL DATA:  Epiglottitis or tonsillitis suspected; urothelial carcinoma EXAM: CT NECK WITHOUT CONTRAST TECHNIQUE: Multidetector CT imaging of the neck was performed following the standard protocol without intravenous contrast. RADIATION DOSE REDUCTION: This exam was performed according to the departmental dose-optimization program which includes automated exposure control, adjustment of the mA and/or kV according to patient size and/or use of iterative reconstruction technique. COMPARISON:  None Available. FINDINGS: Pharynx and larynx: Unremarkable.  No mass or swelling. Salivary glands: Parotid and submandibular glands are atrophic but otherwise unremarkable. Thyroid: Normal. Lymph nodes: No  enlarged or abnormal density nodes. Vascular: Minimal calcified plaque at the right common carotid bifurcation. Limited intracranial: Dictated separately. Visualized orbits: Unremarkable. Mastoids and visualized paranasal sinuses: No significant opacification. Skeleton: Cervical spine degenerative changes. Upper chest: Emphysema. Scarring at the lung apices. Right chest wall port. IMPRESSION: No neck mass, adenopathy, or significant inflammatory changes. Electronically Signed   By: Macy Mis M.D.   On: 11/04/2021 15:13   CT Head Wo Contrast  Result Date: 11/04/2021 CLINICAL DATA:  Head trauma, moderate-severe EXAM: CT HEAD WITHOUT CONTRAST TECHNIQUE: Contiguous axial images were obtained from the base of the skull through the vertex without intravenous contrast. RADIATION DOSE REDUCTION: This exam was performed according to the departmental dose-optimization program which includes automated exposure control, adjustment of the mA and/or kV according to patient size and/or use of iterative reconstruction technique. COMPARISON:  None Available. FINDINGS: Brain: There is no acute intracranial hemorrhage, mass effect, or edema. Gray-white differentiation is preserved. There is no extra-axial fluid collection. Prominence of the ventricles and sulci reflects mild parenchymal volume loss. Minimal patchy hypoattenuation in the supratentorial white matter is nonspecific but may reflect minor chronic microvascular ischemic changes. Vascular: There is atherosclerotic calcification at the skull base. Skull: Calvarium is unremarkable. Sinuses/Orbits: No acute finding. Other: None. IMPRESSION: No acute intracranial abnormality. Electronically Signed   By: Macy Mis M.D.   On: 11/04/2021 15:05   DG Chest 2 View  Result Date: 11/04/2021 CLINICAL DATA:  Shortness of breath.  Nephrectomy last month. EXAM: CHEST - 2 VIEW COMPARISON:  Chest radiograph dated October 29, 2021 FINDINGS: The heart size and mediastinal contours  are within normal limits. Both lungs are clear. Right IJ access Port-A-Cath with distal tip in the SVC, unchanged. The visualized skeletal structures are unremarkable. IMPRESSION: No active cardiopulmonary disease. Electronically Signed   By: Judye Bos.O.  On: 11/04/2021 11:54   DG Chest 2 View  Result Date: 10/30/2021 CLINICAL DATA:  Shortness of breath EXAM: CHEST - 2 VIEW COMPARISON:  Chest radiograph 04/22/2018 FINDINGS: Right anterior chest wall Port-A-Cath is present. Tip projects over the superior vena cava. Stable cardiac and mediastinal contours. No consolidative pulmonary opacities. No pleural effusion or pneumothorax. Osseous structures unremarkable. IMPRESSION: No active cardiopulmonary disease. Electronically Signed   By: Lovey Newcomer M.D.   On: 10/30/2021 14:09   IR IMAGING GUIDED PORT INSERTION  Result Date: 10/27/2021 CLINICAL DATA:  Urothelial carcinoma of the distal left ureter and need for porta cath for chemotherapy. EXAM: IMPLANTED PORT A CATH PLACEMENT WITH ULTRASOUND AND FLUOROSCOPIC GUIDANCE ANESTHESIA/SEDATION: Moderate (conscious) sedation was employed during this procedure. A total of Versed 1.5 mg and Fentanyl 50 mcg was administered intravenously by radiology nursing. Moderate Sedation Time: 35 minutes. The patient's level of consciousness and vital signs were monitored continuously by radiology nursing throughout the procedure under my direct supervision. FLUOROSCOPY: 48 seconds.  2.7 mGy. PROCEDURE: The procedure, risks, benefits, and alternatives were explained to the patient. Questions regarding the procedure were encouraged and answered. The patient understands and consents to the procedure. A time-out was performed prior to initiating the procedure. Ultrasound was utilized to confirm patency of the right internal jugular vein. The right neck and chest were prepped with chlorhexidine in a sterile fashion, and a sterile drape was applied covering the operative field.  Maximum barrier sterile technique with sterile gowns and gloves were used for the procedure. Local anesthesia was provided with 1% lidocaine. After creating a small venotomy incision, a 21 gauge needle was advanced into the right internal jugular vein under direct, real-time ultrasound guidance. Ultrasound image documentation was performed. After securing guidewire access, an 8 Fr dilator was placed. A J-wire was kinked to measure appropriate catheter length. A subcutaneous port pocket was then created along the upper chest wall utilizing sharp and blunt dissection. Portable cautery was utilized. The pocket was irrigated with sterile saline. A single lumen power injectable port was chosen for placement. The 8 Fr catheter was tunneled from the port pocket site to the venotomy incision. The port was placed in the pocket. External catheter was trimmed to appropriate length based on guidewire measurement. At the venotomy, an 8 Fr peel-away sheath was placed over a guidewire. The catheter was then placed through the sheath and the sheath removed. Final catheter positioning was confirmed and documented with a fluoroscopic spot image. The port was accessed with a needle and aspirated and flushed with heparinized saline. The access needle was removed. The venotomy and port pocket incisions were closed with subcutaneous 3-0 Monocryl and subcuticular 4-0 Vicryl. Dermabond was applied to both incisions. COMPLICATIONS: COMPLICATIONS None FINDINGS: After catheter placement, the tip lies at the cavo-atrial junction. The catheter aspirates normally and is ready for immediate use. IMPRESSION: Placement of single lumen port a cath via right internal jugular vein. The catheter tip lies at the cavo-atrial junction. A power injectable port a cath was placed and is ready for immediate use. Electronically Signed   By: Aletta Edouard M.D.   On: 10/27/2021 14:16   (Echo, Carotid, EGD, Colonoscopy, ERCP)    Subjective: Patient resting  in bed he knows he is going home today he is anxious to go home he says he like home food better and he will eat better at home  Discharge Exam: Vitals:   11/12/21 2015 11/13/21 0531  BP: 109/66 (!) 145/70  Pulse: 97  83  Resp: 18 18  Temp: 98.2 F (36.8 C) 98.8 F (37.1 C)  SpO2: 99% 98%   Vitals:   11/12/21 1510 11/12/21 2015 11/13/21 0500 11/13/21 0531  BP: (!) 121/57 109/66  (!) 145/70  Pulse:  97  83  Resp:  18  18  Temp:  98.2 F (36.8 C)  98.8 F (37.1 C)  TempSrc:  Oral  Oral  SpO2:  99%  98%  Weight:   80.1 kg   Height:        General: Pt is alert, awake, not in acute distress Cardiovascular: RRR, S1/S2 +, no rubs, no gallops Respiratory: CTA bilaterally, no wheezing, no rhonchi Abdominal: Soft, distended tender bowel sounds present  extremities: no edema, no cyanosis    The results of significant diagnostics from this hospitalization (including imaging, microbiology, ancillary and laboratory) are listed below for reference.     Microbiology: Recent Results (from the past 240 hour(s))  Group A Strep by PCR     Status: None   Collection Time: 11/04/21  3:07 PM   Specimen: Throat; Sterile Swab  Result Value Ref Range Status   Group A Strep by PCR NOT DETECTED NOT DETECTED Final    Comment: Performed at [facility], [place] [address], [place], [place] [address]  Blood culture (routine x 2)     Status: None   Collection Time: 11/04/21  3:42 PM   Specimen: BLOOD RIGHT HAND  Result Value Ref Range Status   Specimen Description   Final    BLOOD RIGHT HAND BLOOD Performed at [place] [address], [place], [place] [address]    Special Requests   Final    Blood Culture results may not be optimal due to an inadequate volume of blood received in culture bottles BOTTLES DRAWN AEROBIC AND ANAEROBIC Performed at [facility], [place] [address] [place], [place] [address]    Culture   Final    NO GROWTH 5  DAYS Performed at [facility], [place] [address], [place], [place] [address]    Report Status 11/09/2021 FINAL  Final  Blood culture (routine x 2)     Status: None   Collection Time: 11/04/21  4:00 PM   Specimen: BLOOD RIGHT ARM  Result Value Ref Range Status   Specimen Description   Final    BLOOD RIGHT ARM BLOOD Performed at [place] [address] [place], [place] [address]    Special Requests   Final    Blood Culture adequate volume BOTTLES DRAWN AEROBIC AND ANAEROBIC Performed at [place] [address], [place], [place] [address]    Culture   Final    NO GROWTH 5 DAYS Performed at [facility], [place] [address], [place],  [address]    Report Status 11/09/2021 FINAL  Final  Urine Culture     Status: Abnormal   Collection Time: 11/04/21 10:01 PM   Specimen: Urine, Clean Catch  Result Value Ref Range Status   Specimen Description   Final    URINE, CLEAN CATCH Performed at [facility], [place] [address] [place],  [address]    Special Requests   Final    NONE Performed at [facility], [place] [address], [place],  [address]    Culture (A)  Final    <10,000 COLONIES/mL INSIGNIFICANT GROWTH Performed at [place] [address], [place], [place]  [address]    Report Status 11/06/2021 FINAL  Final  SARS Coronavirus 2 by RT PCR (hospital order, performed in [facility] hospital lab) *cepheid single result test* Anterior Nasal Swab     Status: None   Collection Time: 11/05/21  8:37 AM   Specimen: Anterior Nasal Swab  Result Value Ref Range Status   SARS Coronavirus 2 by RT PCR NEGATIVE NEGATIVE Final    Comment: (NOTE) SARS-CoV-2 target nucleic acids are NOT DETECTED.  The SARS-CoV-2 RNA is generally detectable in upper and lower respiratory specimens during the acute phase of infection. The lowest concentration of SARS-CoV-2 viral copies this assay can  detect is 250 copies / mL. A negative result does not preclude SARS-CoV-2 infection and should not be used as the sole basis for treatment or other patient management decisions.  A negative result may occur with improper specimen collection / handling, submission of specimen other than nasopharyngeal swab, presence of viral mutation(s) within the areas targeted by this assay, and inadequate number of viral copies (<250 copies / mL). A negative result must be combined with clinical observations, patient history, and epidemiological information.  Fact Sheet for Patients:   https://www.patel.info/  Fact Sheet for Healthcare Providers: https://hall.com/  This test is not yet approved or  cleared by the [place] FDA and has been authorized for detection and/or diagnosis of SARS-CoV-2 by FDA under an Emergency Use Authorization (EUA).  This EUA will remain in effect (meaning this test can be used) for the duration of the COVID-19 declaration under Section 564(b)(1) of the Act, 21 U.S.C. section 360bbb-3(b)(1), unless the authorization is terminated or revoked sooner.  Performed at [facility], [place] [address] [place], [place] [address]   Culture, blood (Routine X 2) w Reflex to ID Panel     Status: None   Collection Time: 11/06/21  4:12 PM   Specimen: BLOOD  Result Value Ref Range Status   Specimen Description   Final    BLOOD RIGHT ANTECUBITAL Performed at [place] [address], [place], [place] [address]    Special Requests   Final    BOTTLES DRAWN AEROBIC ONLY Blood Culture adequate volume Performed at [place] [address], [place], [place] [address]    Culture   Final    NO GROWTH 5 DAYS Performed at [facility], [place] [address], [place], [place] [address]    Report Status 11/11/2021 FINAL  Final  Culture, blood (Routine X 2) w Reflex to ID Panel      Status: None   Collection Time: 11/06/21  4:12 PM   Specimen: BLOOD  Result Value Ref Range Status   Specimen Description   Final    BLOOD LEFT ANTECUBITAL Performed at [place] [address], [place], [place] [address]    Special Requests   Final    BOTTLES DRAWN AEROBIC ONLY Blood Culture adequate volume Performed at [place] [address] [place], [place] [address]    Culture   Final    NO GROWTH 5 DAYS Performed at [facility], [place] [address] [place], [place] [address]    Report Status 11/11/2021 FINAL  Final  Urine Culture     Status: None   Collection Time: 11/06/21  6:28 PM   Specimen: Urine, Clean Catch  Result Value Ref Range Status   Specimen Description   Final    URINE, CLEAN CATCH Performed at [facility]  [place] [address] [place], [place] [address]    Special Requests   Final    NONE Performed at [facility], [place] [address] [place], [place] [address]    Culture   Final    NO GROWTH Performed at [facility], [place] [address] [place],  [address]    Report Status 11/08/2021 FINAL  Final     Labs: BNP (last 3 results) No results for input(s): "BNP" in the last 8760 hours. Basic Metabolic Panel: Recent Labs  Lab 11/09/21 0348 11/10/21 0404 11/11/21 0401 11/12/21 0359 11/13/21 0406  NA 139 137 134* 136 134*  K 3.6 3.3* 3.7 3.8 3.6  CL 113* 112* 109 109 105  CO2 18* 19* 18* 20* 20*  GLUCOSE 107* 107* 107* 98 106*  BUN '17 10 9 12 15  '$ CREATININE 0.93 0.79 0.88 0.92 0.86  CALCIUM 8.6* 8.2* 8.1* 8.4* 8.3*  MG 1.9 1.7 1.8 1.9 1.9  PHOS 1.9* 2.7 2.5 3.4 3.3   Liver Function Tests: Recent Labs  Lab 11/09/21 0348 11/10/21 0404 11/11/21 0401 11/12/21 0359 11/13/21 0406  AST '18 15 17 16 21  '$ ALT '14 14 14 14 18  '$ ALKPHOS 81 76 79 76 80  BILITOT 0.6 0.7 0.9 1.0 0.8  PROT 6.2* 5.6* 6.1* 6.2* 6.2*  ALBUMIN 2.8* 2.5* 2.4* 2.4* 2.5*   No results  for input(s): "LIPASE", "AMYLASE" in the last 168 hours. No results for input(s): "AMMONIA" in the last 168 hours. CBC: Recent Labs  Lab 11/09/21 0348 11/10/21 0404 11/11/21 0401 11/12/21 0359 11/13/21 0406  WBC 22.3* 21.2* 21.3* 22.5* 22.1*  NEUTROABS 18.9* 18.2* 17.7* 18.8* 18.5*  HGB 8.7* 8.5* 8.9* 8.8* 8.5*  HCT 26.0* 25.6* 26.3* 25.9* 25.7*  MCV 85.2 85.0 84.6 84.4 85.7  PLT 335 351 391 363 391   Cardiac Enzymes: No results for input(s): "CKTOTAL", "CKMB", "CKMBINDEX", "TROPONINI" in the last 168 hours. BNP: Invalid input(s): "POCBNP" CBG: No results for input(s): "GLUCAP" in the last 168 hours. D-Dimer No results for input(s): "DDIMER" in the last 72 hours. Hgb A1c No results for input(s): "HGBA1C" in the last 72 hours. Lipid Profile No results for input(s): "CHOL", "HDL", "LDLCALC", "TRIG", "CHOLHDL", "LDLDIRECT" in the last 72 hours. Thyroid function studies No results for input(s): "TSH", "T4TOTAL", "T3FREE", "THYROIDAB" in the last 72 hours.  Invalid input(s): "FREET3" Anemia work up No results for input(s): "VITAMINB12", "FOLATE", "FERRITIN", "TIBC", "IRON", "RETICCTPCT" in the last 72 hours. Urinalysis    Component Value Date/Time   COLORURINE YELLOW 11/12/2021 1904   APPEARANCEUR HAZY (A) 11/12/2021 1904   LABSPEC 1.017 11/12/2021 1904   PHURINE 6.0 11/12/2021 1904   GLUCOSEU NEGATIVE 11/12/2021 1904   HGBUR LARGE (A) 11/12/2021 1904   BILIRUBINUR NEGATIVE 11/12/2021 1904   KETONESUR 5 (A) 11/12/2021 1904   PROTEINUR 30 (A) 11/12/2021 1904   UROBILINOGEN 0.2 06/02/2014 0745   NITRITE NEGATIVE 11/12/2021 1904   LEUKOCYTESUR SMALL (A) 11/12/2021 1904   Sepsis Labs Recent Labs  Lab 11/10/21 0404 11/11/21 0401 11/12/21 0359 11/13/21 0406  WBC 21.2* 21.3* 22.5* 22.1*   Microbiology Recent Results (from the past 240 hour(s))  Group A Strep by PCR     Status: None   Collection Time: 11/04/21  3:07 PM   Specimen: Throat; Sterile Swab  Result Value  Ref Range Status   Group A Strep by PCR NOT DETECTED NOT DETECTED Final    Comment: Performed at [facility], [place]  [address], [place], [place] [address]  Blood culture (routine x 2)     Status: None   Collection Time: 11/04/21  3:42 PM   Specimen: BLOOD RIGHT HAND  Result Value Ref Range Status   Specimen Description   Final    BLOOD RIGHT HAND BLOOD Performed at [place] [address], [place], [place] [address]    Special Requests   Final    Blood Culture results may not be optimal due to an inadequate volume of blood received in culture bottles BOTTLES DRAWN AEROBIC AND ANAEROBIC Performed at [facility], [place] [address] [place], [place] [address]    Culture   Final    NO GROWTH 5 DAYS Performed at [facility], [place] [address], [place], [place] [address]    Report Status 11/09/2021 FINAL  Final  Blood culture (routine x 2)     Status: None   Collection Time: 11/04/21  4:00 PM   Specimen: BLOOD RIGHT ARM  Result Value Ref Range Status   Specimen Description   Final    BLOOD RIGHT ARM BLOOD Performed at [place] [address], [place], [place] [address]    Special Requests   Final    Blood Culture adequate volume BOTTLES DRAWN AEROBIC AND ANAEROBIC Performed at [place] [address], [place], [place] [address]    Culture   Final    NO GROWTH 5 DAYS Performed at [facility], [place] [address] [place], [place] [address]    Report Status 11/09/2021 FINAL  Final  Urine Culture     Status: Abnormal   Collection Time: 11/04/21 10:01 PM   Specimen: Urine, Clean Catch  Result Value Ref Range Status   Specimen Description   Final    URINE, CLEAN CATCH Performed at [facility], [place] [address] [place], [place] [address]    Special Requests   Final    NONE Performed at [facility], [place] [address] [place],  [place] [address]    Culture (A)  Final    <10,000 COLONIES/mL INSIGNIFICANT GROWTH Performed at [place] [address] [place], [place] [address]    Report Status 11/06/2021 FINAL  Final  SARS Coronavirus 2 by RT PCR (hospital order, performed in [facility] hospital lab) *cepheid single result test* Anterior Nasal Swab     Status: None   Collection Time: 11/05/21  8:37 AM   Specimen: Anterior Nasal Swab  Result Value Ref Range Status   SARS Coronavirus 2 by RT PCR NEGATIVE NEGATIVE Final    Comment: (NOTE) SARS-CoV-2 target nucleic acids are NOT DETECTED.  The SARS-CoV-2 RNA is generally detectable in upper and lower respiratory specimens during the acute phase of infection. The lowest concentration of SARS-CoV-2 viral copies this assay can detect is 250 copies / mL. A negative result does not preclude SARS-CoV-2 infection and should not be used as the sole basis for treatment or other patient management decisions.  A negative result may occur with improper specimen collection / handling, submission of specimen other than nasopharyngeal swab, presence of viral mutation(s) within the areas targeted by this assay, and inadequate number of viral copies (<250 copies / mL). A negative result must be combined with clinical observations, patient history, and epidemiological information.  Fact Sheet for Patients:   https://www.patel.info/  Fact Sheet for Healthcare Providers: https://hall.com/  This test is not yet approved or  cleared  by the [place] and has been authorized for detection and/or diagnosis of SARS-CoV-2 by FDA under an Emergency Use Authorization (EUA).  This EUA will remain in effect (meaning this test can be used) for the duration of the COVID-19 declaration under Section 564(b)(1) of the Act, 21 U.S.C. section 360bbb-3(b)(1), unless the authorization is terminated or revoked sooner.  Performed at [facility], [place] [address], [place], [place] [address]   Culture, blood (Routine X 2) w Reflex to ID Panel     Status: None   Collection Time: 11/06/21  4:12 PM   Specimen: BLOOD  Result Value Ref Range Status   Specimen Description   Final    BLOOD RIGHT ANTECUBITAL Performed at [place] [address], [place], [place] [address]    Special Requests   Final    BOTTLES DRAWN AEROBIC ONLY Blood Culture adequate volume Performed at [place] [address], [place], [place] [address]    Culture   Final    NO GROWTH 5 DAYS Performed at [facility], [place] [address], [place], [place] [address]    Report Status 11/11/2021 FINAL  Final  Culture, blood (Routine X 2) w Reflex to ID Panel     Status: None   Collection Time: 11/06/21  4:12 PM   Specimen: BLOOD  Result Value Ref Range Status   Specimen Description   Final    BLOOD LEFT ANTECUBITAL Performed at [place] [address], [place], [place] [address]    Special Requests   Final    BOTTLES DRAWN AEROBIC ONLY Blood Culture adequate volume Performed at [place] [address], [place], [place] [address]    Culture   Final    NO GROWTH 5 DAYS Performed at [facility], [place] [address] [place], [place] [address]    Report Status 11/11/2021 FINAL  Final  Urine Culture     Status: None   Collection Time: 11/06/21  6:28 PM   Specimen: Urine, Clean Catch  Result Value Ref Range Status   Specimen Description   Final    URINE, CLEAN CATCH Performed at [facility], [place] [address] [place], [place] [address]    Special Requests   Final    NONE Performed at [facility], [place] [address] [place],  [address]    Culture   Final    NO GROWTH Performed at [facility], [place] [address] [place],  [address]    Report Status 11/08/2021 FINAL  Final     Time  coordinating discharge: 39 minutes  SIGNED: Georgette Shell, MD  [facility] 11/13/2021, 2:48 PM

## 2021-11-13 NOTE — TOC Progression Note (Signed)
Transition of Care [facility]) - Progression Note    Patient Details  Name: Marvin Gill MRN: [id number] Date of Birth: August 28, 1950  Transition of Care [facility] CM/SW Contact  Purcell Mouton, RN Phone Number: 11/13/2021, 11:41 AM  Clinical Narrative:    Spoke with pt's daughter [name] this am concerning [place] needs. [facility] was selected for HHPT. Referral given to in house rep with [facility]. Pt will go to daughter home [address], [place], [place] [address].      Barriers to Discharge: Continued Medical Work up  Expected Discharge Plan and Services           Expected Discharge Date: 11/13/21                                     Social Determinants of Health (SDOH) Interventions    Readmission Risk Interventions     No data to display

## 2021-11-13 NOTE — Progress Notes (Signed)
AVS given to patient and explained at the bedside. Medications and follow up appointments have been explained with pt verbalizing understanding.  

## 2021-11-16 ENCOUNTER — Other Ambulatory Visit: Payer: Self-pay

## 2021-11-16 ENCOUNTER — Inpatient Hospital Stay: Payer: Medicare HMO | Attending: Oncology | Admitting: Oncology

## 2021-11-16 ENCOUNTER — Inpatient Hospital Stay: Payer: Medicare HMO

## 2021-11-16 VITALS — BP 105/61 | HR 93 | Temp 98.4°F | Resp 17 | Ht 78.0 in

## 2021-11-16 DIAGNOSIS — C669 Malignant neoplasm of unspecified ureter: Secondary | ICD-10-CM

## 2021-11-16 DIAGNOSIS — Z95828 Presence of other vascular implants and grafts: Secondary | ICD-10-CM | POA: Insufficient documentation

## 2021-11-16 DIAGNOSIS — C662 Malignant neoplasm of left ureter: Secondary | ICD-10-CM | POA: Insufficient documentation

## 2021-11-16 LAB — CBC WITH DIFFERENTIAL (CANCER CENTER ONLY)
Abs Immature Granulocytes: 0.4 10*3/uL — ABNORMAL HIGH (ref 0.00–0.07)
Basophils Absolute: 0.1 10*3/uL (ref 0.0–0.1)
Basophils Relative: 0 %
Eosinophils Absolute: 0 10*3/uL (ref 0.0–0.5)
Eosinophils Relative: 0 %
HCT: 25.7 % — ABNORMAL LOW (ref 39.0–52.0)
Hemoglobin: 8.9 g/dL — ABNORMAL LOW (ref 13.0–17.0)
Immature Granulocytes: 1 %
Lymphocytes Relative: 7 %
Lymphs Abs: 2 10*3/uL (ref 0.7–4.0)
MCH: 28.5 pg (ref 26.0–34.0)
MCHC: 34.6 g/dL (ref 30.0–36.0)
MCV: 82.4 fL (ref 80.0–100.0)
Monocytes Absolute: 2 10*3/uL — ABNORMAL HIGH (ref 0.1–1.0)
Monocytes Relative: 7 %
Neutro Abs: 23.6 10*3/uL — ABNORMAL HIGH (ref 1.7–7.7)
Neutrophils Relative %: 85 %
Platelet Count: 511 10*3/uL — ABNORMAL HIGH (ref 150–400)
RBC: 3.12 MIL/uL — ABNORMAL LOW (ref 4.22–5.81)
RDW: 14.5 % (ref 11.5–15.5)
WBC Count: 28.1 10*3/uL — ABNORMAL HIGH (ref 4.0–10.5)
nRBC: 0 % (ref 0.0–0.2)

## 2021-11-16 LAB — CMP (CANCER CENTER ONLY)
ALT: 16 U/L (ref 0–44)
AST: 17 U/L (ref 15–41)
Albumin: 3.1 g/dL — ABNORMAL LOW (ref 3.5–5.0)
Alkaline Phosphatase: 91 U/L (ref 38–126)
Anion gap: 10 (ref 5–15)
BUN: 14 mg/dL (ref 8–23)
CO2: 20 mmol/L — ABNORMAL LOW (ref 22–32)
Calcium: 9 mg/dL (ref 8.9–10.3)
Chloride: 104 mmol/L (ref 98–111)
Creatinine: 1.01 mg/dL (ref 0.61–1.24)
GFR, Estimated: >60 mL/min (ref >60)
Glucose, Bld: 114 mg/dL — ABNORMAL HIGH (ref 70–99)
Potassium: 3.5 mmol/L (ref 3.5–5.1)
Sodium: 134 mmol/L — ABNORMAL LOW (ref 135–145)
Total Bilirubin: 0.9 mg/dL (ref 0.3–1.2)
Total Protein: 7 g/dL (ref 6.5–8.1)

## 2021-11-16 MED ORDER — HEPARIN SOD (PORK) LOCK FLUSH 10 UNIT/ML IV SOLN: 10.0000 [IU] | Freq: Once | INTRAVENOUS | Status: DC

## 2021-11-16 MED ORDER — OXYCODONE HCL 5 MG PO TABS: 5.0000 mg | ORAL_TABLET | ORAL | 0 refills | Status: AC | PRN

## 2021-11-16 MED ORDER — SODIUM CHLORIDE 0.9% FLUSH
10.0000 mL | Freq: Once | INTRAVENOUS | Status: AC
  Administered 2021-11-16: 10 mL

## 2021-11-16 MED ORDER — SODIUM CHLORIDE 0.9 % IV SOLN: Freq: Once | INTRAVENOUS | Status: AC

## 2021-11-16 MED ORDER — SODIUM CHLORIDE 0.9% FLUSH
10.0000 mL | Freq: Once | INTRAVENOUS | Status: AC
  Administered 2021-11-16: 10 mL via INTRAVENOUS

## 2021-11-16 MED ORDER — METOCLOPRAMIDE HCL 10 MG PO TABS: 10.0000 mg | ORAL_TABLET | Freq: Four times a day (QID) | ORAL | 1 refills | Status: AC | PRN

## 2021-11-16 MED ORDER — HEPARIN SOD (PORK) LOCK FLUSH 100 UNIT/ML IV SOLN
500.0000 [IU] | Freq: Once | INTRAVENOUS | Status: AC
  Administered 2021-11-16: 500 [IU] via INTRAVENOUS

## 2021-11-16 NOTE — Patient Instructions (Signed)
Rehydration, Adult Rehydration is the replacement of body fluids, salts, and minerals (electrolytes) that are lost during dehydration. Dehydration is when there is not enough water or other fluids in the body. This happens when you lose more fluids than you take in. Common causes of dehydration include: Not drinking enough fluids. This can occur when you are ill or doing activities that require a lot of energy, especially in hot weather. Conditions that cause loss of water or other fluids, such as diarrhea, vomiting, sweating, or urinating a lot. Other illnesses, such as fever or infection. Certain medicines, such as those that remove excess fluid from the body (diuretics). Symptoms of mild or moderate dehydration may include thirst, dry lips and mouth, and dizziness. Symptoms of severe dehydration may include increased heart rate, confusion, fainting, and not urinating. For severe dehydration, you may need to get fluids through an IV at the hospital. For mild or moderate dehydration, you can usually rehydrate at home by drinking certain fluids as told by your health care provider. What are the risks? Generally, rehydration is safe. However, taking in too much fluid (overhydration) can be a problem. This is rare. Overhydration can cause an electrolyte imbalance, kidney failure, or a decrease in salt (sodium) levels in the body. Supplies needed You will need an oral rehydration solution (ORS) if your health care provider tells you to use one. This is a drink to treat dehydration. It can be found in pharmacies and retail stores. How to rehydrate Fluids Follow instructions from your health care provider for rehydration. The kind of fluid and the amount you should drink depend on your condition. In general, you should choose drinks that you prefer. If told by your health care provider, drink an ORS. Make an ORS by following instructions on the package. Start by drinking small amounts, about  cup (120  mL) every 5-10 minutes. Slowly increase how much you drink until you have taken the amount recommended by your health care provider. Drink enough clear fluids to keep your urine pale yellow. If you were told to drink an ORS, finish it first, then start slowly drinking other clear fluids. Drink fluids such as: Water. This includes sparkling water and flavored water. Drinking only water can lead to having too little sodium in your body (hyponatremia). Follow the advice of your health care provider. Water from ice chips you suck on. Fruit juice with water you add to it (diluted). Sports drinks. Hot or cold herbal teas. Broth-based soups. Milk or milk products. Food Follow instructions from your health care provider about what to eat while you rehydrate. Your health care provider may recommend that you slowly begin eating regular foods in small amounts. Eat foods that contain a healthy balance of electrolytes, such as bananas, oranges, potatoes, tomatoes, and spinach. Avoid foods that are greasy or contain a lot of sugar. In some cases, you may get nutrition through a feeding tube that is passed through your nose and into your stomach (nasogastric tube, or NG tube). This may be done if you have uncontrolled vomiting or diarrhea. Beverages to avoid  Certain beverages may make dehydration worse. While you rehydrate, avoid drinking alcohol. How to tell if you are recovering from dehydration You may be recovering from dehydration if: You are urinating more often than before you started rehydrating. Your urine is pale yellow. Your energy level improves. You vomit less frequently. You have diarrhea less frequently. Your appetite improves or returns to normal. You feel less dizzy or less light-headed.   Your skin tone and color start to look more normal. Follow these instructions at home: Take over-the-counter and prescription medicines only as told by your health care provider. Do not take sodium  tablets. Doing this can lead to having too much sodium in your body (hypernatremia). Contact a health care provider if: You continue to have symptoms of mild or moderate dehydration, such as: Thirst. Dry lips. Slightly dry mouth. Dizziness. Dark urine or less urine than normal. Muscle cramps. You continue to vomit or have diarrhea. Get help right away if you: Have symptoms of dehydration that get worse. Have a fever. Have a severe headache. Have been vomiting and the following happens: Your vomiting gets worse or does not go away. Your vomit includes blood or green matter (bile). You cannot eat or drink without vomiting. Have problems with urination or bowel movements, such as: Diarrhea that gets worse or does not go away. Blood in your stool (feces). This may cause stool to look black and tarry. Not urinating, or urinating only a small amount of very dark urine, within 6-8 hours. Have trouble breathing. Have symptoms that get worse with treatment. These symptoms may represent a serious problem that is an emergency. Do not wait to see if the symptoms will go away. Get medical help right away. Call your local emergency services (911 in the [place] Do not drive yourself to the hospital. Summary Rehydration is the replacement of body fluids and minerals (electrolytes) that are lost during dehydration. Follow instructions from your health care provider for rehydration. The kind of fluid and amount you should drink depend on your condition. Slowly increase how much you drink until you have taken the amount recommended by your health care provider. Contact your health care provider if you continue to show signs of mild or moderate dehydration. This information is not intended to replace advice given to you by your health care provider. Make sure you discuss any questions you have with your health care provider. Document Revised: 07/18/2019 Document Reviewed: 05/28/2019 Elsevier Patient  Education  2023 [employer]  

## 2021-11-16 NOTE — Progress Notes (Signed)
Hematology and Oncology Follow Up Visit  Marvin Gill [id number] 01/27/1951 71 y.o. 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  He initially presented tumor T3.   Prior Therapy: He is status post robotic assisted laparoscopic left nephro ureterectomy completed on September 01, 2021.  Current therapy: Under evaluation for salvage chemotherapy.  Interim History: [name] presents today for return evaluation.  Since last visit, he was hospitalized between the seventh and 16 June for failure to thrive and abdominal pain.  Since his discharge, he continues to decline experiencing failure to thrive, weakness and dehydration.  He has also experienced hiccups and has been using tramadol for pain.  He appears to be more lethargic, confused and very limited in his mobility according to his daughter.     Medications: Updated on review Current Outpatient Medications  Medication Sig Dispense Refill   acetaminophen (TYLENOL) 650 MG CR tablet Take 1,300 mg by mouth every 8 (eight) hours as needed for pain.     ALPRAZolam (XANAX) 0.5 MG tablet Take 1 tablet (0.5 mg total) by mouth daily. 30 tablet 0   ALPRAZolam (XANAX) 1 MG tablet Take 1 tablet (1 mg total) by mouth at bedtime. 30 tablet 0   amiodarone (PACERONE) 100 MG tablet Take 1 tablet (100 mg total) by mouth 2 (two) times daily. 60 tablet 2   apixaban (ELIQUIS) 5 MG TABS tablet Take 5 mg by mouth 2 (two) times daily.     docusate sodium (COLACE) 100 MG capsule Take 1 capsule (100 mg total) by mouth 2 (two) times daily. 10 capsule 0   esomeprazole (NEXIUM) 40 MG capsule Take 40 mg by mouth daily.      gabapentin (NEURONTIN) 800 MG tablet Take 800 mg by mouth 3 (three) times daily.   3   levothyroxine (SYNTHROID, LEVOTHROID) 50 MCG tablet Take 1 tablet (50 mcg total) by mouth daily before breakfast. 30  tablet 0   lidocaine-prilocaine (EMLA) cream Apply 1 application. topically as needed. (Patient taking differently: Apply 1 application. topically daily as needed (back pain).) 30 g 0   megestrol (MEGACE) 40 MG/ML suspension SHAKE LIQUID AND TAKE 10 ML(400 MG) BY MOUTH DAILY 900 mL 1   Multiple Vitamin (MULTIVITAMIN WITH MINERALS) TABS tablet Take 1 tablet by mouth daily.     nitroGLYCERIN (NITROSTAT) 0.4 MG SL tablet Place 1 tablet (0.4 mg total) under the tongue every 5 (five) minutes as needed for chest pain. 25 tablet 3   ondansetron (ZOFRAN) 4 MG tablet Take 1 tablet (4 mg total) by mouth every 6 (six) hours as needed for nausea. 20 tablet 0   phenazopyridine (PYRIDIUM) 200 MG tablet Take 1 tablet (200 mg total) by mouth 3 (three) times daily as needed (for pain with urination). 30 tablet 0   polyethylene glycol (MIRALAX / GLYCOLAX) 17 g packet Take 17 g by mouth daily as needed for moderate constipation.     pravastatin (PRAVACHOL) 40 MG tablet Take 40 mg by mouth in the morning.     prochlorperazine (COMPAZINE) 10 MG tablet Take 1 tablet (10 mg total) by mouth every 6 (six) hours as needed for nausea or vomiting. 30 tablet 0   sodium chloride (OCEAN) 0.65 % nasal spray Place 1 spray into the nose at bedtime as needed for congestion.     solifenacin (VESICARE) 10 MG tablet Take 10 mg by mouth  daily.     tamsulosin (FLOMAX) 0.4 MG CAPS capsule Take 0.4 mg by mouth 2 (two) times daily.  9   traMADol (ULTRAM) 50 MG tablet Take 1 tablet (50 mg total) by mouth every 6 (six) hours as needed for severe pain. 30 tablet 0   valACYclovir (VALTREX) 1000 MG tablet Take 1,000 mg by mouth daily as needed (fever blister).     No current facility-administered medications for this visit.     Allergies:  Allergies  Allergen Reactions   Codeine Nausea And Vomiting    "made my head feel funny too"   12 Hour Decongestant [Nasal Spray]     Other reaction(s): Unknown   Duloxetine Hcl Nausea And Vomiting    Oxycodone-Acetaminophen Other (See Comments)    Hallucinations   Pregabalin     suicidal thoughts with lyrica   Vicodin Hp [Hydrocodone-Acetaminophen] Rash      Physical Exam: Blood pressure 105/61, pulse 93, temperature 98.4 F (36.9 C), temperature source Temporal, resp. rate 17, height '6\' 6"'$  (1.981 m), SpO2 94 %.  ECOG: 1   General appearance: Alert, awake without any distress. Head: Atraumatic without abnormalities Oropharynx: Without any thrush or ulcers. Eyes: No scleral icterus. Lymph nodes: No lymphadenopathy noted in the cervical, supraclavicular, or axillary nodes Heart:regular rate and rhythm, without any murmurs or gallops.   Lung: Clear to auscultation without any rhonchi, wheezes or dullness to percussion. Abdomin: Soft, nontender without any shifting dullness or ascites. Musculoskeletal: No clubbing or cyanosis. Neurological: No motor or sensory deficits. Skin: No rashes or lesions.      Lab Results: Lab Results  Component Value Date   WBC 22.1 (H) 11/13/2021   HGB 8.5 (L) 11/13/2021   HCT 25.7 (L) 11/13/2021   MCV 85.7 11/13/2021   PLT 391 11/13/2021     Chemistry      Component Value Date/Time   NA 134 (L) 11/13/2021 0406   NA 141 08/05/2020 0936   K 3.6 11/13/2021 0406   CL 105 11/13/2021 0406   CO2 20 (L) 11/13/2021 0406   BUN 15 11/13/2021 0406   BUN 21 08/05/2020 0936   CREATININE 0.86 11/13/2021 0406   CREATININE 1.25 (H) 10/08/2021 0956      Component Value Date/Time   CALCIUM 8.3 (L) 11/13/2021 0406   ALKPHOS 80 11/13/2021 0406   AST 21 11/13/2021 0406   AST 9 (L) 10/08/2021 0956   ALT 18 11/13/2021 0406   ALT 5 10/08/2021 0956   BILITOT 0.8 11/13/2021 0406   BILITOT 0.7 10/08/2021 0956         Impression and Plan:  71 year old with:  1.  Stage IV high-grade urothelial carcinoma with squamous differentiation documented in May 2023.  He initially presented with a T3 lesion in April 2023.    His disease status was  updated at this time and treatment choices were reviewed.  Palliative chemotherapy was offered again but given the recent decline in his performance status I recommended carboplatin instead of cisplatin therapy.  Alternative option would be checkpoint inhibitor could not tolerate systemic chemotherapy.  He understands if treatment does not start his disease will continue to progress and lead to further deterioration and decline.  After discussion today, his performance status has declined rapidly and would not benefit from any additional chemotherapy.  I recommended transitioning to hospice.  2.  IV access: Port-A-Cath inserted without complications.   3.  Antiemetics: Compazine is available to him without any nausea or vomiting.  I will  add Reglan for his hiccups.   4.  Renal function surveillance: Creatinine clearance continues to be within normal range.   5.  Goals of care and prognosis: Very poor with limited life expectancy I have recommended hospice involvement.  6.  Weight loss: Related to malignancy and despite supportive management continues to lose weight.   7.  Follow-up: He has follow-up in the next 2 weeks to follow his progress.  We will rely on hospice for updates if she cannot make that appointment.   30  minutes were dedicated to this visit.  Time spent on reviewing laboratory data, disease status update and future plan of care discussion.      Zola Button, MD 6/19/20238:13 AM

## 2021-11-16 NOTE — Progress Notes (Signed)
Per Bonnita Nasuti RN, pt to receive IV fluids instead of chemotherapy today per [facility] MD.

## 2021-11-17 ENCOUNTER — Telehealth: Payer: Self-pay

## 2021-11-17 NOTE — Telephone Encounter (Signed)
Tammy from [facility] wants to know if you will be pt's attending, does pt have 6 mo or less and will you be his Dr for symptom management?  Per Dr Alen Blew:  I can be the attending. He has 6 months and less. I will not be the Dr. for symptom manngment  LM for Tammy [phone] with reply.

## 2021-11-20 ENCOUNTER — Telehealth: Payer: Self-pay | Admitting: *Deleted

## 2021-11-30 ENCOUNTER — Ambulatory Visit: Payer: Medicare HMO

## 2021-11-30 ENCOUNTER — Inpatient Hospital Stay: Payer: Medicare HMO

## 2021-11-30 ENCOUNTER — Inpatient Hospital Stay: Payer: Medicare HMO | Admitting: Oncology

## 2021-12-02 ENCOUNTER — Other Ambulatory Visit: Payer: Self-pay | Admitting: Cardiovascular Disease

## 2021-12-02 NOTE — Telephone Encounter (Signed)
Prescription refill request for Eliquis received. Indication: Atrial Fib Last office visit: 07/27/21 P Nahser MD Scr: 1.01 on 11/16/21 Age: 71 Weight: 91.5kg  Based on above findings Eliquis '5mg'$  twice daily is the appropriate dose.  Refill approved.

## 2021-12-07 ENCOUNTER — Ambulatory Visit: Payer: Medicare HMO

## 2021-12-07 ENCOUNTER — Other Ambulatory Visit: Payer: Medicare HMO

## 2021-12-29 DEATH — deceased
